# Patient Record
Sex: Male | Born: 1935 | Race: White | Hispanic: No | Marital: Married | Smoking: Former smoker
Health system: Southern US, Community
[De-identification: ages and names within clinical notes are randomized; demographics above are authoritative.]

## PROBLEM LIST (undated history)

## (undated) DIAGNOSIS — I2510 Atherosclerotic heart disease of native coronary artery without angina pectoris: Secondary | ICD-10-CM

## (undated) DIAGNOSIS — I4891 Unspecified atrial fibrillation: Secondary | ICD-10-CM

## (undated) DIAGNOSIS — R748 Abnormal levels of other serum enzymes: Secondary | ICD-10-CM

## (undated) DIAGNOSIS — I724 Aneurysm of artery of lower extremity: Secondary | ICD-10-CM

## (undated) DIAGNOSIS — Z951 Presence of aortocoronary bypass graft: Secondary | ICD-10-CM

## (undated) DIAGNOSIS — R9430 Abnormal result of cardiovascular function study, unspecified: Secondary | ICD-10-CM

## (undated) DIAGNOSIS — R609 Edema, unspecified: Secondary | ICD-10-CM

## (undated) DIAGNOSIS — I739 Peripheral vascular disease, unspecified: Secondary | ICD-10-CM

## (undated) DIAGNOSIS — I872 Venous insufficiency (chronic) (peripheral): Secondary | ICD-10-CM

## (undated) DIAGNOSIS — G459 Transient cerebral ischemic attack, unspecified: Secondary | ICD-10-CM

## (undated) DIAGNOSIS — R55 Syncope and collapse: Secondary | ICD-10-CM

## (undated) DIAGNOSIS — R001 Bradycardia, unspecified: Secondary | ICD-10-CM

## (undated) DIAGNOSIS — Y92009 Unspecified place in unspecified non-institutional (private) residence as the place of occurrence of the external cause: Secondary | ICD-10-CM

## (undated) DIAGNOSIS — E785 Hyperlipidemia, unspecified: Secondary | ICD-10-CM

## (undated) DIAGNOSIS — W19XXXA Unspecified fall, initial encounter: Secondary | ICD-10-CM

## (undated) DIAGNOSIS — I779 Disorder of arteries and arterioles, unspecified: Secondary | ICD-10-CM

## (undated) DIAGNOSIS — IMO0002 Reserved for concepts with insufficient information to code with codable children: Secondary | ICD-10-CM

## (undated) DIAGNOSIS — R479 Unspecified speech disturbances: Secondary | ICD-10-CM

## (undated) DIAGNOSIS — R0989 Other specified symptoms and signs involving the circulatory and respiratory systems: Secondary | ICD-10-CM

## (undated) DIAGNOSIS — I629 Nontraumatic intracranial hemorrhage, unspecified: Secondary | ICD-10-CM

## (undated) HISTORY — DX: Unspecified fall, initial encounter: W19.XXXA

## (undated) HISTORY — DX: Venous insufficiency (chronic) (peripheral): I87.2

## (undated) HISTORY — DX: Disorder of arteries and arterioles, unspecified: I77.9

## (undated) HISTORY — DX: Other specified symptoms and signs involving the circulatory and respiratory systems: R09.89

## (undated) HISTORY — DX: Abnormal result of cardiovascular function study, unspecified: R94.30

## (undated) HISTORY — DX: Presence of aortocoronary bypass graft: Z95.1

## (undated) HISTORY — DX: Edema, unspecified: R60.9

## (undated) HISTORY — PX: OTHER SURGICAL HISTORY: SHX169

## (undated) HISTORY — DX: Atherosclerotic heart disease of native coronary artery without angina pectoris: I25.10

## (undated) HISTORY — DX: Nontraumatic intracranial hemorrhage, unspecified: I62.9

## (undated) HISTORY — DX: Aneurysm of artery of lower extremity: I72.4

## (undated) HISTORY — DX: Abnormal levels of other serum enzymes: R74.8

## (undated) HISTORY — DX: Transient cerebral ischemic attack, unspecified: G45.9

## (undated) HISTORY — DX: Reserved for concepts with insufficient information to code with codable children: IMO0002

## (undated) HISTORY — DX: Hyperlipidemia, unspecified: E78.5

## (undated) HISTORY — DX: Bradycardia, unspecified: R00.1

## (undated) HISTORY — DX: Unspecified speech disturbances: R47.9

## (undated) HISTORY — DX: Unspecified place in unspecified non-institutional (private) residence as the place of occurrence of the external cause: Y92.009

## (undated) HISTORY — DX: Peripheral vascular disease, unspecified: I73.9

## (undated) HISTORY — DX: Syncope and collapse: R55

---

## 1996-03-04 HISTORY — PX: CORONARY ARTERY BYPASS GRAFT: SHX141

## 1999-02-19 ENCOUNTER — Inpatient Hospital Stay (HOSPITAL_COMMUNITY): Admission: EM | Admit: 1999-02-19 | Discharge: 1999-02-20 | Payer: Self-pay | Admitting: Emergency Medicine

## 1999-02-19 ENCOUNTER — Encounter: Payer: Self-pay | Admitting: Emergency Medicine

## 2004-02-01 ENCOUNTER — Ambulatory Visit: Payer: Self-pay | Admitting: Cardiology

## 2005-02-04 ENCOUNTER — Ambulatory Visit: Payer: Self-pay | Admitting: Cardiology

## 2005-02-20 ENCOUNTER — Encounter: Payer: Self-pay | Admitting: Cardiology

## 2005-02-20 ENCOUNTER — Ambulatory Visit: Payer: Self-pay

## 2006-02-04 ENCOUNTER — Ambulatory Visit: Payer: Self-pay | Admitting: Cardiology

## 2007-01-26 ENCOUNTER — Ambulatory Visit: Payer: Self-pay | Admitting: Cardiology

## 2007-10-21 ENCOUNTER — Encounter: Payer: Self-pay | Admitting: Cardiology

## 2008-01-04 ENCOUNTER — Ambulatory Visit: Payer: Self-pay | Admitting: Cardiology

## 2008-01-12 ENCOUNTER — Ambulatory Visit: Payer: Self-pay | Admitting: Cardiology

## 2008-01-20 ENCOUNTER — Ambulatory Visit: Payer: Self-pay

## 2008-09-22 ENCOUNTER — Ambulatory Visit: Payer: Self-pay | Admitting: Ophthalmology

## 2008-10-17 ENCOUNTER — Ambulatory Visit: Payer: Self-pay | Admitting: Ophthalmology

## 2008-11-23 ENCOUNTER — Encounter (INDEPENDENT_AMBULATORY_CARE_PROVIDER_SITE_OTHER): Payer: Self-pay | Admitting: Emergency Medicine

## 2008-11-23 ENCOUNTER — Ambulatory Visit: Payer: Self-pay | Admitting: Cardiology

## 2008-11-23 ENCOUNTER — Inpatient Hospital Stay (HOSPITAL_COMMUNITY): Admission: EM | Admit: 2008-11-23 | Discharge: 2008-11-25 | Payer: Self-pay | Admitting: Emergency Medicine

## 2008-11-24 ENCOUNTER — Ambulatory Visit: Payer: Self-pay | Admitting: Vascular Surgery

## 2008-12-06 ENCOUNTER — Inpatient Hospital Stay (HOSPITAL_COMMUNITY): Admission: RE | Admit: 2008-12-06 | Discharge: 2008-12-10 | Payer: Self-pay | Admitting: Vascular Surgery

## 2008-12-08 ENCOUNTER — Encounter: Payer: Self-pay | Admitting: Vascular Surgery

## 2008-12-09 ENCOUNTER — Ambulatory Visit: Payer: Self-pay | Admitting: Physical Medicine & Rehabilitation

## 2008-12-28 ENCOUNTER — Ambulatory Visit: Payer: Self-pay | Admitting: Vascular Surgery

## 2008-12-28 ENCOUNTER — Encounter: Payer: Self-pay | Admitting: Cardiology

## 2008-12-31 ENCOUNTER — Encounter: Payer: Self-pay | Admitting: Cardiology

## 2009-01-02 ENCOUNTER — Ambulatory Visit: Payer: Self-pay | Admitting: Cardiology

## 2009-03-07 ENCOUNTER — Encounter: Payer: Self-pay | Admitting: Cardiology

## 2009-03-30 ENCOUNTER — Ambulatory Visit: Payer: Self-pay | Admitting: Vascular Surgery

## 2009-04-10 ENCOUNTER — Encounter: Payer: Self-pay | Admitting: Cardiology

## 2009-04-12 ENCOUNTER — Ambulatory Visit: Payer: Self-pay | Admitting: Vascular Surgery

## 2009-04-19 ENCOUNTER — Ambulatory Visit: Payer: Self-pay | Admitting: Cardiology

## 2009-05-03 ENCOUNTER — Encounter: Admission: RE | Admit: 2009-05-03 | Discharge: 2009-05-03 | Payer: Self-pay | Admitting: Vascular Surgery

## 2009-05-03 ENCOUNTER — Ambulatory Visit: Payer: Self-pay | Admitting: Vascular Surgery

## 2009-05-04 ENCOUNTER — Ambulatory Visit: Payer: Self-pay

## 2009-05-04 ENCOUNTER — Encounter: Payer: Self-pay | Admitting: Cardiology

## 2009-06-07 ENCOUNTER — Ambulatory Visit: Payer: Self-pay | Admitting: Vascular Surgery

## 2009-10-31 ENCOUNTER — Encounter: Payer: Self-pay | Admitting: Cardiology

## 2009-11-01 ENCOUNTER — Ambulatory Visit: Payer: Self-pay | Admitting: Cardiology

## 2009-11-09 ENCOUNTER — Ambulatory Visit: Payer: Self-pay | Admitting: Vascular Surgery

## 2010-02-08 ENCOUNTER — Ambulatory Visit: Payer: Self-pay | Admitting: Vascular Surgery

## 2010-04-01 LAB — CONVERTED CEMR LAB
CO2: 34 meq/L — ABNORMAL HIGH (ref 19–32)
Chloride: 102 meq/L (ref 96–112)
Sodium: 141 meq/L (ref 135–145)
Total CK: 35 units/L (ref 7–232)

## 2010-04-03 NOTE — Assessment & Plan Note (Signed)
Summary: PER CHECK OUT/SF      Allergies Added:   Visit Type:  Follow-up Referring Provider:  Fabienne Bruns, MD / [place] Ropes Primary Provider:  Tama Gander, MD   History of Present Illness: The patient is seen for cardiology followup.  He has not had any syncope since I saw him last.  His leg is improving but he is being followed carefully by Dr.Fields to be sure there is graft is completely intact.  The patient did have an episode in January, 2011 that is described as him arriving at home and not knowing which driveway was his.  An MRI was done March 07, 2009 in [place].  There was no bleed or infarct.  A faxed copy available to me is not completely clear at the bottom of the page.  The patient has seen Dr.Reynolds of the neurology team.  I will send a copy of my information to him today.  There is question of planning for carotid Dopplers and a followup 2-D echo.  We are actively reviewing all of our information to document when the studies were done last.   From the cardiac viewpoint the patient feels well.  He is not having any palpitations or chest pain.  Current Medications (verified): 1)  Atenolol 25 Mg Tabs (Atenolol) .... Take One Tablet By Mouth Daily 2)  Simvastatin 10 Mg Tabs (Simvastatin) .... Take One Tablet By Mouth Daily At Bedtime 3)  Aspirin 81 Mg Tbec (Aspirin) .... Take One Tablet By Mouth Daily 4)  Multivitamins  Tabs (Multiple Vitamin) .... Take 1 By Mouth Once Daily 5)  Calcium 500 Mg Tabs (Calcium Carbonate) .... Take 2 By Mouth Once Daily  Allergies (verified): 1)  ! Penicillin 2)  ! Sulfa  Past History:  Past Medical History: coronary artery disease ...myoview...01/2008..no ischemia...EF  60% .Marland Kitchen..distal anterior scar  Ejection fraction.... normal by history.... no echo data available from the past as of April 19, 2009. CABG 1998 Hyperlipidemia Syncope..remote..no etiology  /  syncope 11/2008.. from acute leg pain Intermittent muscle aches and  pains of unknown etiology Good LV function Venous insufficiency with support hose worn Abnormal pulsation in the abdomen but normal abdominal aorta by ultrasound carotid artery disease..mild  .... Doppler.. January, 2008.... no significant carotid artery stenoses at that time.  Vertebral artery flow was antegrade bilaterally Discomfort and legs when walking CPK elevation 11/23/08.Marland KitchenMarland Kitchenprobably from debris from popliteal aneurysm Popliteal aneurysm .Marland Kitchen..repair..11/25/2008  Dr. Darrick Penna Neurologic event with brief infusion and speech difficulty.... January, 2011.... being assessed by neurology February, 2011  Review of Systems       Patient denies fever, chills, headache, sweats, rash, change in vision, change in hearing, chest pain, cough, shortness of breath, nausea vomiting, urinary symptoms.  All other systems are reviewed and are negative.  Vital Signs:  Patient profile:   77 year old male Height:      71 inches Weight:      216 pounds Pulse rate:   75 / minute BP sitting:   128 / 68  (left arm) Cuff size:   regular  Vitals Entered By: Hardin Negus, RMA (April 19, 2009 8:49 AM)  Physical Exam  General:  The patient is stable today. Head:  head is atraumatic. Eyes:  no xanthelasma. Neck:  no jugular venous distention. Chest Wall:  no chest wall tenderness. Lungs:  lungs are clear.  Respiratory effort is nonlabored. Heart:  cardiac exam reveals S1 and S2.  No clicks or significant murmurs. Abdomen:  abdomen is  soft. Msk:  no musculoskeletal deformities. Extremities:  no peripheral edema. Skin:  no skin rashes. Psych:  patient is oriented to person time and place.  Affect is normal.  A chair with his wife and his daughter today.   Impression & Recommendations:  Problem # 1:  * NEUROLOGIC EVENT JANUARY, 2011 BRIEF SPEECH DIFFICULTY capitis point the patient is neurologic event does not appear to be primary cardiac in origin.  I have reviewed to find data that we have from the  past.  We do not have an echocardiogram for several years at least.  The last carotid Doppler was done in 2008. He had no significant carotid disease at that time.  I will talk with the patient's daughter and finalize plans for him to have both an echo and carotid Dopplers done soon.  Problem # 2:  * POPLITEAL ANEURYSM The patient is post surgical repair of this aneurysm.  He follows with Dr. Darrick Penna  Problem # 3:  * CPK ELEVATION The patient has had CPK elevation around the time of his popliteal aneurysm.  We rechecked his CPK recently and it has normalized.  No further workup.  Problem # 4:  SYNCOPE (ICD-780.2) The patient has not had syncope since his last visit.  No further workup at this time.  Problem # 5:  CAD (ICD-414.00) Coronary disease is stable at this time.  I have reviewed outside labs the patient's primary physician.  His renal function is good and his TSH was normal.  These labs will be scanned into our computer.  I see the patient for cardiology follow up in 6 months.  Other Orders: Echocardiogram (Echo) Carotid Duplex (Carotid Duplex)  Patient Instructions: 1)  Your physician has requested that you have a carotid duplex. This test is an ultrasound of the carotid arteries in your neck. It looks at blood flow through these arteries that supply the brain with blood. Allow one hour for this exam. There are no restrictions or special instructions.  Can be done at [facility]. 2)  Your physician has requested that you have an echocardiogram.  Echocardiography is a painless test that uses sound waves to create images of your heart. It provides your doctor with information about the size and shape of your heart and how well your heart's chambers and valves are working.  This procedure takes approximately one hour. There are no restrictions for this procedure.  Can be done at [facility]. 3)  Follow up in 6 months

## 2010-04-03 NOTE — Assessment & Plan Note (Signed)
Summary: per check out/sf      Allergies Added:   Visit Type:  Follow-up Referring Provider:  Fabienne Bruns, MD / Thad Ranger, [place] Primary Provider:  Tama Gander, MD  CC:  CAD.  History of Present Illness: This delightful gentleman is seen for followup of coronary disease and syncope.  I have known him for many years.  Most recently he had a syncopal episode in 2010 associated with severe pain in his leg.  Ultimately all of these issues resolved.  He had an episode with decreased speech in January, 2011.  He is seeing neurology and no definite abnormalities have been found.  He does have some LV dysfunction by echo in March of 2011.  Is not having any chest pain he has no signs of heart failure.  Current Medications (verified): 1)  Atenolol 25 Mg Tabs (Atenolol) .... Take One Tablet By Mouth Daily 2)  Simvastatin 10 Mg Tabs (Simvastatin) .... Take One Tablet By Mouth Daily At Bedtime 3)  Aspirin 81 Mg Tbec (Aspirin) .... Take One Tablet By Mouth Daily 4)  Multivitamins  Tabs (Multiple Vitamin) .... Take 1 By Mouth Once Daily 5)  Calcium 500 Mg Tabs (Calcium Carbonate) .... Take 2 By Mouth Once Daily  Allergies (verified): 1)  ! Penicillin 2)  ! Sulfa  Past History:  Past Medical History: C AD    .Marland KitchenCeline Ahr...01/2008..no ischemia...EF  60% .Marland Kitchen..distal anterior scar  Ejection fraction.... normal by history...  /   echo..05/2009...45%...apical hypo.Marland KitchenMarland Kitchen??posterior and lateral hypo CABG 1998 Hyperlipidemia Syncope..remote..no etiology  /  syncope 11/2008.. from acute leg pain Intermittent muscle aches and pains of unknown etiology Good LV function Venous insufficiency with support hose worn Abnormal pulsation in the abdomen but normal abdominal aorta by ultrasound carotid artery disease..mild  .... Doppler.. January, 2008.... no significant carotid artery stenoses at that time.  Vertebral artery flow was antegrade bilaterally Discomfort and legs when walking CPK elevation  11/23/08.Marland KitchenMarland Kitchenprobably from debris from popliteal aneurysm Popliteal aneurysm .Marland Kitchen..repair..11/25/2008  Dr. Darrick Penna Neurologic event with brief infusion and speech difficulty.... January, 2011.... being assessed by neurology February, 2011 Carotid doppler.Marland KitchenMarland Kitchen3/2011....0-39%  bilateral  Review of Systems       Patient denies fever, chills, headache, sweats, rash, change in vision, change in hearing, chest pain, cough, nausea vomiting, urinary symptoms.  All other systems are reviewed and are negative.  Vital Signs:  Patient profile:   77 year old male Height:      71 inches Weight:      210 pounds BMI:     29.39 Pulse rate:   75 / minute BP sitting:   138 / 82  (left arm) Cuff size:   regular  Vitals Entered By: Hardin Negus, RMA (November 01, 2009 3:52 PM)  Physical Exam  General:  he looks great today. Eyes:  no xanthelasma. Neck:  no jugular venous distention. Lungs:  lungs are clear progress per Mitzie Na is not labored. Heart:  cardiac exam reveals S1 and S2.  No clicks or significant murmurs. Abdomen:  abdomen is soft. Extremities:  patient has chronic edema in his legs and is wearing his support. Psych:  patient is oriented to person time and place.  Affect is normal.   Impression & Recommendations:  Problem # 1:  * NEUROLOGIC EVENT JANUARY, 2011 BRIEF SPEECH DIFFICULTY This is resolved completely.  The patient is stable.  Problem # 2:  VENOUS INSUFFICIENCY (ICD-459.81) The patient wear support hose regularly.  Problem # 3:  SYNCOPE (ICD-780.2)  His updated medication list  for this problem includes:    Atenolol 25 Mg Tabs (Atenolol) .Marland Kitchen... Take one tablet by mouth daily    Aspirin 81 Mg Tbec (Aspirin) .Marland Kitchen... Take one tablet by mouth daily There is been no recurrent syncope.  He is stable.  Problem # 4:  CAD (ICD-414.00)  His updated medication list for this problem includes:    Atenolol 25 Mg Tabs (Atenolol) .Marland Kitchen... Take one tablet by mouth daily    Aspirin 81 Mg Tbec  (Aspirin) .Marland Kitchen... Take one tablet by mouth daily Coronary disease is stable.  No further workup.  Patient Instructions: 1)  Your physician recommends that you schedule a follow-up appointment in: 6 months. The office will mail you a reminder letter 2 months prior appointment date. Prescriptions: SIMVASTATIN 10 MG TABS (SIMVASTATIN) Take one tablet by mouth daily at bedtime  #90 x 3   Entered by:   Ollen Gross, RN, BSN   Authorized by:   Talitha Givens, MD, [facility]   Signed by:   Ollen Gross, RN, BSN on 11/01/2009   Method used:   Print then Give to Patient   RxID:   [id number] ATENOLOL 25 MG TABS (ATENOLOL) Take one tablet by mouth daily  #90 x 3   Entered by:   Ollen Gross, RN, BSN   Authorized by:   Talitha Givens, MD, [facility]   Signed by:   Ollen Gross, RN, BSN on 11/01/2009   Method used:   Print then Give to Patient   RxID:   [id number]

## 2010-04-03 NOTE — Miscellaneous (Signed)
  Clinical Lists Changes  Observations: Added new observation of PAST MED HX: C AD    .Marland KitchenCeline Ahr...01/2008..no ischemia...EF  60% .Marland Kitchen..distal anterior scar  Ejection fraction.... normal by history...  /   echo..05/2009...45%...apical hypo.Marland KitchenMarland Kitchen??posterior and lateral hypo CABG 1998 Hyperlipidemia Syncope..remote..no etiology  /  syncope 11/2008.. from acute leg pain Intermittent muscle aches and pains of unknown etiology Good LV function Venous insufficiency with support hose worn Abnormal pulsation in the abdomen but normal abdominal aorta by ultrasound carotid artery disease..mild  .... Doppler.. January, 2008.... no significant carotid artery stenoses at that time.  Vertebral artery flow was antegrade bilaterally Discomfort and legs when walking CPK elevation 11/23/08.Marland KitchenMarland Kitchenprobably from debris from popliteal aneurysm Popliteal aneurysm .Marland Kitchen..repair..11/25/2008  Dr. Darrick Penna Neurologic event with brief infusion and speech difficulty.... January, 2011.... being assessed by neurology February, 2011 Carotid doppler.Marland KitchenMarland Kitchen3/2011....0-39%  bilateral (10/31/2009 20:22) Added new observation of REFERRING MD: Fabienne Bruns, MD / Thad Ranger, M (10/31/2009 20:22) Added new observation of PRIMARY MD: Tama Gander, MD (10/31/2009 20:22)       Past History:  Past Medical History: C AD    .Marland KitchenCeline Ahr...01/2008..no ischemia...EF  60% .Marland Kitchen..distal anterior scar  Ejection fraction.... normal by history...  /   echo..05/2009...45%...apical hypo.Marland KitchenMarland Kitchen??posterior and lateral hypo CABG 1998 Hyperlipidemia Syncope..remote..no etiology  /  syncope 11/2008.. from acute leg pain Intermittent muscle aches and pains of unknown etiology Good LV function Venous insufficiency with support hose worn Abnormal pulsation in the abdomen but normal abdominal aorta by ultrasound carotid artery disease..mild  .... Doppler.. January, 2008.... no significant carotid artery stenoses at that time.  Vertebral artery flow was antegrade  bilaterally Discomfort and legs when walking CPK elevation 11/23/08.Marland KitchenMarland Kitchenprobably from debris from popliteal aneurysm Popliteal aneurysm .Marland Kitchen..repair..11/25/2008  Dr. Darrick Penna Neurologic event with brief infusion and speech difficulty.... January, 2011.... being assessed by neurology February, 2011 Carotid doppler.Marland KitchenMarland Kitchen3/2011....0-39%  bilateral

## 2010-04-03 NOTE — Progress Notes (Signed)
Summary: [facility]  [facility]   Imported By: Harlon Flor 04/20/2009 12:10:59  _____________________________________________________________________  External Attachment:    Type:   Image     Comment:   External Document

## 2010-05-21 ENCOUNTER — Encounter: Payer: Self-pay | Admitting: Cardiology

## 2010-05-21 ENCOUNTER — Ambulatory Visit (INDEPENDENT_AMBULATORY_CARE_PROVIDER_SITE_OTHER): Payer: Medicare Other | Admitting: Cardiology

## 2010-05-21 DIAGNOSIS — I2510 Atherosclerotic heart disease of native coronary artery without angina pectoris: Secondary | ICD-10-CM

## 2010-05-31 NOTE — Miscellaneous (Signed)
  Clinical Lists Changes  Observations: Added new observation of REFERRING MD: Fabienne Bruns, MD / Thad Ranger, M (05/21/2010 17:30) Added new observation of PRIMARY MD: [name], MD (05/21/2010 17:30)

## 2010-05-31 NOTE — Assessment & Plan Note (Signed)
Summary: rov/sl/per pt call/mj/kl    Visit Type:  Follow-up Referring Provider:  Fabienne Bruns, MD / Thad Ranger, [place] Primary Provider:  Tama Gander, MD  CC:  CAD.  History of Present Illness: The patient is seen for cardiology followup.  He is doing extremely well.  He had an echo in March, 2011.  Ejection fraction was 45%.  He had a TIA in January, 2011.  He's had no recurrence.  Carotid Dopplers in March, 2011 showed only minor disease.  Preventive Screening-Counseling & Management  Alcohol-Tobacco     Smoking Status: quit  Caffeine-Diet-Exercise     Does Patient Exercise: yes      Drug Use:  no.    Allergies: 1)  ! Penicillin 2)  ! Sulfa  Past History:  Past Medical History: C AD    .Marland KitchenCeline Ahr...01/2008..no ischemia...EF  60% .Marland Kitchen..distal anterior scar  Ejection fraction.... normal by history...  /   echo..05/2009...45%...apical hypo.Marland KitchenMarland Kitchen??posterior and lateral hypo CABG 1998 Hyperlipidemia Syncope..remote..no etiology  /  syncope 11/2008.. from acute leg pain Intermittent muscle aches and pains of unknown etiology Venous insufficiency with support hose worn Abnormal pulsation in the abdomen but normal abdominal aorta by ultrasound carotid artery disease..mild  .... Doppler.. January, 2008.... no significant carotid artery stenoses at that time.  Vertebral artery flow was antegrade bilaterally Discomfort and legs when walking CPK elevation 11/23/08.Marland KitchenMarland Kitchenprobably from debris from popliteal aneurysm Popliteal aneurysm .Marland Kitchen..repair..11/25/2008  Dr. Darrick Penna Neurologic event with brief infusion and speech difficulty.... January, 2011.... being assessed by neurology February, 2011 Carotid doppler.Marland KitchenMarland Kitchen3/2011....0-39%  bilateral  Past Surgical History: Aneurysm CABG  Family History: Family History of Diabetes: 2 sisters Family History of CVA or Stroke: mother Family History of Diabetes: mother  Social History: Retired  Married  Tobacco Use - Former. 1946 Alcohol Use -  no Regular Exercise - yes Drug Use - no Smoking Status:  quit Does Patient Exercise:  yes Drug Use:  no  Review of Systems       Patient denies fever, chills, headache, sweats, rest, change in vision, change in hearing, chest pain, cough, nausea vomiting, urinary symptoms.  All other systems are reviewed and are negative.  Vital Signs:  Patient profile:   77 year old male Height:      71 inches Weight:      214 pounds BMI:     29.95 Pulse rate:   64 / minute BP sitting:   168 / 89  (left arm) Cuff size:   regular  Vitals Entered By: Hardin Negus, RMA (May 21, 2010 4:41 PM)  Physical Exam  General:  patient is stable. Eyes:  no xanthelasma. Neck:  Neck supple, no JVD. No masses, thyromegaly or abnormal cervical nodes. Lungs:  lungs are clear.  Respiratory effort is nonlabored. Heart:  cardiac exam reveals an S1-S2.  No clicks or significant murmurs. Abdomen:  abdomen soft. Extremities:  no peripheral edema. Psych:  patient is oriented to person time and place.  Affect is normal.   Impression & Recommendations:  Problem # 1:  * NEUROLOGIC EVENT JANUARY, 2011 BRIEF SPEECH DIFFICULTY This has resolved completely.  He is stable.  Problem # 2:  SYNCOPE (ICD-780.2) There's been no recurrent syncope.  Problem # 3:  CAD (ICD-414.00)  His updated medication list for this problem includes:    Atenolol 25 Mg Tabs (Atenolol) .Marland Kitchen... Take one tablet by mouth daily    Aspirin 81 Mg Tbec (Aspirin) .Marland Kitchen... Take one tablet by mouth daily Coronary disease is stable.  No change  in therapy.  I see him back in 6 months.  Patient Instructions: 1)  Your physician wants you to follow-up in:  6 months.  You will receive a reminder letter in the mail two months in advance. If you don't receive a letter, please call our office to schedule the follow-up appointment.

## 2010-06-01 ENCOUNTER — Encounter: Payer: Self-pay | Admitting: Cardiology

## 2010-06-07 LAB — URINALYSIS, ROUTINE W REFLEX MICROSCOPIC
Bilirubin Urine: NEGATIVE
Glucose, UA: NEGATIVE mg/dL
Glucose, UA: NEGATIVE mg/dL
Hgb urine dipstick: NEGATIVE
Ketones, ur: NEGATIVE mg/dL
Ketones, ur: NEGATIVE mg/dL
Protein, ur: NEGATIVE mg/dL
pH: 7.5 (ref 5.0–8.0)

## 2010-06-07 LAB — COMPREHENSIVE METABOLIC PANEL
ALT: 16 U/L (ref 0–53)
CO2: 29 mEq/L (ref 19–32)
Calcium: 8.8 mg/dL (ref 8.4–10.5)
Creatinine, Ser: 0.76 mg/dL (ref 0.4–1.5)
GFR calc non Af Amer: >60 mL/min (ref >60)
Glucose, Bld: 119 mg/dL — ABNORMAL HIGH (ref 70–99)

## 2010-06-07 LAB — CBC
HCT: 32.5 % — ABNORMAL LOW (ref 39.0–52.0)
HCT: 35 % — ABNORMAL LOW (ref 39.0–52.0)
HCT: 42.7 % (ref 39.0–52.0)
Hemoglobin: 11.4 g/dL — ABNORMAL LOW (ref 13.0–17.0)
Hemoglobin: 12 g/dL — ABNORMAL LOW (ref 13.0–17.0)
Hemoglobin: 14.4 g/dL (ref 13.0–17.0)
MCHC: 33.8 g/dL (ref 30.0–36.0)
MCV: 94.4 fL (ref 78.0–100.0)
MCV: 94.5 fL (ref 78.0–100.0)
MCV: 95.7 fL (ref 78.0–100.0)
Platelets: 110 10*3/uL — ABNORMAL LOW (ref 150–400)
RBC: 3.66 MIL/uL — ABNORMAL LOW (ref 4.22–5.81)
RBC: 4.52 MIL/uL (ref 4.22–5.81)
RDW: 13.5 % (ref 11.5–15.5)
WBC: 6.8 10*3/uL (ref 4.0–10.5)

## 2010-06-07 LAB — BASIC METABOLIC PANEL
BUN: 6 mg/dL (ref 6–23)
BUN: 8 mg/dL (ref 6–23)
Chloride: 101 mEq/L (ref 96–112)
Chloride: 104 mEq/L (ref 96–112)
GFR calc Af Amer: >60 mL/min (ref >60)
Glucose, Bld: 130 mg/dL — ABNORMAL HIGH (ref 70–99)
Potassium: 3.9 mEq/L (ref 3.5–5.1)
Potassium: 4.7 mEq/L (ref 3.5–5.1)
Sodium: 140 mEq/L (ref 135–145)

## 2010-06-07 LAB — PROTIME-INR: Prothrombin Time: 13.7 seconds (ref 11.6–15.2)

## 2010-06-07 LAB — ABO/RH: ABO/RH(D): B POS

## 2010-06-08 LAB — POCT I-STAT, CHEM 8
BUN: 14 mg/dL (ref 6–23)
Chloride: 105 mEq/L (ref 96–112)
Creatinine, Ser: 0.8 mg/dL (ref 0.4–1.5)
Potassium: 4.2 mEq/L (ref 3.5–5.1)
Sodium: 138 mEq/L (ref 135–145)

## 2010-06-08 LAB — CBC
HCT: 38 % — ABNORMAL LOW (ref 39.0–52.0)
Hemoglobin: 12.9 g/dL — ABNORMAL LOW (ref 13.0–17.0)
MCV: 95 fL (ref 78.0–100.0)
MCV: 95.1 fL (ref 78.0–100.0)
Platelets: 125 10*3/uL — ABNORMAL LOW (ref 150–400)
Platelets: 131 10*3/uL — ABNORMAL LOW (ref 150–400)
RBC: 4.33 MIL/uL (ref 4.22–5.81)
RDW: 13.1 % (ref 11.5–15.5)
WBC: 6.4 10*3/uL (ref 4.0–10.5)

## 2010-06-08 LAB — DIFFERENTIAL
Eosinophils Relative: 1 % (ref 0–5)
Lymphocytes Relative: 9 % — ABNORMAL LOW (ref 12–46)
Lymphs Abs: 0.6 10*3/uL — ABNORMAL LOW (ref 0.7–4.0)
Monocytes Absolute: 0.1 10*3/uL (ref 0.1–1.0)

## 2010-06-08 LAB — BASIC METABOLIC PANEL
BUN: 10 mg/dL (ref 6–23)
BUN: 12 mg/dL (ref 6–23)
Calcium: 8.6 mg/dL (ref 8.4–10.5)
Chloride: 104 mEq/L (ref 96–112)
Chloride: 105 mEq/L (ref 96–112)
Creatinine, Ser: 0.74 mg/dL (ref 0.4–1.5)
GFR calc Af Amer: >60 mL/min (ref >60)
GFR calc non Af Amer: >60 mL/min (ref >60)
Glucose, Bld: 109 mg/dL — ABNORMAL HIGH (ref 70–99)
Potassium: 4.3 mEq/L (ref 3.5–5.1)
Sodium: 138 mEq/L (ref 135–145)

## 2010-06-08 LAB — GLUCOSE, CAPILLARY
Glucose-Capillary: 112 mg/dL — ABNORMAL HIGH (ref 70–99)
Glucose-Capillary: 96 mg/dL (ref 70–99)

## 2010-06-08 LAB — TROPONIN I
Troponin I: 0.03 ng/mL (ref 0.00–0.06)
Troponin I: 0.03 ng/mL (ref 0.00–0.06)

## 2010-06-08 LAB — LIPID PANEL
Cholesterol: 101 mg/dL (ref 0–200)
HDL: 29 mg/dL — ABNORMAL LOW (ref >39)
Total CHOL/HDL Ratio: 3.5 RATIO
Triglycerides: 68 mg/dL (ref <150)

## 2010-06-08 LAB — PROTIME-INR
INR: 1.3 (ref 0.00–1.49)
Prothrombin Time: 15.7 seconds — ABNORMAL HIGH (ref 11.6–15.2)

## 2010-06-08 LAB — CARDIAC PANEL(CRET KIN+CKTOT+MB+TROPI)
CK, MB: 17 ng/mL — ABNORMAL HIGH (ref 0.3–4.0)
CK, MB: 18.8 ng/mL — ABNORMAL HIGH (ref 0.3–4.0)
Relative Index: 0.5 (ref 0.0–2.5)
Relative Index: 0.5 (ref 0.0–2.5)
Relative Index: 0.5 (ref 0.0–2.5)
Troponin I: 0.02 ng/mL (ref 0.00–0.06)
Troponin I: 0.02 ng/mL (ref 0.00–0.06)
Troponin I: 0.03 ng/mL (ref 0.00–0.06)

## 2010-06-08 LAB — URINALYSIS, ROUTINE W REFLEX MICROSCOPIC
Bilirubin Urine: NEGATIVE
Hgb urine dipstick: NEGATIVE
Ketones, ur: NEGATIVE mg/dL
Nitrite: NEGATIVE
Urobilinogen, UA: 1 mg/dL (ref 0.0–1.0)
pH: 6 (ref 5.0–8.0)

## 2010-06-08 LAB — URINE MICROSCOPIC-ADD ON

## 2010-06-08 LAB — CK TOTAL AND CKMB (NOT AT ARMC)
CK, MB: 6.6 ng/mL — ABNORMAL HIGH (ref 0.3–4.0)
Relative Index: 1.3 (ref 0.0–2.5)

## 2010-06-26 ENCOUNTER — Inpatient Hospital Stay (HOSPITAL_COMMUNITY)
Admission: EM | Admit: 2010-06-26 | Discharge: 2010-06-30 | DRG: 085 | Disposition: A | Discharge status: Home Health | Payer: Medicare Other | Attending: Internal Medicine | Admitting: Internal Medicine

## 2010-06-26 ENCOUNTER — Emergency Department (HOSPITAL_COMMUNITY): Payer: Medicare Other

## 2010-06-26 DIAGNOSIS — I872 Venous insufficiency (chronic) (peripheral): Secondary | ICD-10-CM | POA: Diagnosis present

## 2010-06-26 DIAGNOSIS — J189 Pneumonia, unspecified organism: Secondary | ICD-10-CM | POA: Diagnosis present

## 2010-06-26 DIAGNOSIS — I2589 Other forms of chronic ischemic heart disease: Secondary | ICD-10-CM | POA: Diagnosis present

## 2010-06-26 DIAGNOSIS — R5381 Other malaise: Secondary | ICD-10-CM | POA: Diagnosis present

## 2010-06-26 DIAGNOSIS — Z87891 Personal history of nicotine dependence: Secondary | ICD-10-CM

## 2010-06-26 DIAGNOSIS — Z7982 Long term (current) use of aspirin: Secondary | ICD-10-CM

## 2010-06-26 DIAGNOSIS — R55 Syncope and collapse: Secondary | ICD-10-CM | POA: Diagnosis present

## 2010-06-26 DIAGNOSIS — Y92009 Unspecified place in unspecified non-institutional (private) residence as the place of occurrence of the external cause: Secondary | ICD-10-CM

## 2010-06-26 DIAGNOSIS — I2510 Atherosclerotic heart disease of native coronary artery without angina pectoris: Secondary | ICD-10-CM | POA: Diagnosis present

## 2010-06-26 DIAGNOSIS — S06300A Unspecified focal traumatic brain injury without loss of consciousness, initial encounter: Principal | ICD-10-CM | POA: Diagnosis present

## 2010-06-26 DIAGNOSIS — I10 Essential (primary) hypertension: Secondary | ICD-10-CM | POA: Diagnosis present

## 2010-06-26 DIAGNOSIS — W19XXXA Unspecified fall, initial encounter: Secondary | ICD-10-CM | POA: Diagnosis present

## 2010-06-26 DIAGNOSIS — E785 Hyperlipidemia, unspecified: Secondary | ICD-10-CM | POA: Diagnosis present

## 2010-06-26 DIAGNOSIS — Z951 Presence of aortocoronary bypass graft: Secondary | ICD-10-CM

## 2010-06-26 LAB — CBC
Hemoglobin: 14.5 g/dL (ref 13.0–17.0)
MCH: 32.1 pg (ref 26.0–34.0)
Platelets: ADEQUATE 10*3/uL (ref 150–400)
RBC: 4.52 MIL/uL (ref 4.22–5.81)

## 2010-06-26 LAB — DIFFERENTIAL
Basophils Absolute: 0 10*3/uL (ref 0.0–0.1)
Eosinophils Absolute: 0.1 10*3/uL (ref 0.0–0.7)
Lymphocytes Relative: 12 % (ref 12–46)
Monocytes Relative: 8 % (ref 3–12)
Neutro Abs: 5.4 10*3/uL (ref 1.7–7.7)
Neutrophils Relative %: 79 % — ABNORMAL HIGH (ref 43–77)

## 2010-06-26 LAB — COMPREHENSIVE METABOLIC PANEL
ALT: 13 U/L (ref 0–53)
AST: 37 U/L (ref 0–37)
Albumin: 3.6 g/dL (ref 3.5–5.2)
CO2: 26 mEq/L (ref 19–32)
Calcium: 8.9 mg/dL (ref 8.4–10.5)
Creatinine, Ser: 0.78 mg/dL (ref 0.4–1.5)
GFR calc Af Amer: >60 mL/min (ref >60)
GFR calc non Af Amer: >60 mL/min (ref >60)
Sodium: 134 mEq/L — ABNORMAL LOW (ref 135–145)
Total Protein: 6.4 g/dL (ref 6.0–8.3)

## 2010-06-26 LAB — URINALYSIS, ROUTINE W REFLEX MICROSCOPIC
Bilirubin Urine: NEGATIVE
Ketones, ur: 15 mg/dL — AB
Nitrite: NEGATIVE
Protein, ur: NEGATIVE mg/dL
pH: 8 (ref 5.0–8.0)

## 2010-06-26 LAB — CK TOTAL AND CKMB (NOT AT ARMC)
CK, MB: 1.6 ng/mL (ref 0.3–4.0)
CK, MB: 1.3 ng/mL (ref 0.3–4.0)
Relative Index: INVALID (ref 0.0–2.5)
Relative Index: INVALID (ref 0.0–2.5)
Total CK: 46 U/L (ref 7–232)

## 2010-06-26 LAB — PROCALCITONIN: Procalcitonin: <0.1 ng/mL

## 2010-06-26 LAB — PROTIME-INR
INR: 1.01 (ref 0.00–1.49)
Prothrombin Time: 13.5 seconds (ref 11.6–15.2)

## 2010-06-26 LAB — POCT I-STAT, CHEM 8
HCT: 42 % (ref 39.0–52.0)
Hemoglobin: 14.3 g/dL (ref 13.0–17.0)
Potassium: 5.3 mEq/L — ABNORMAL HIGH (ref 3.5–5.1)
Sodium: 134 mEq/L — ABNORMAL LOW (ref 135–145)
TCO2: 24 mmol/L (ref 0–100)

## 2010-06-26 LAB — TROPONIN I: Troponin I: 0.02 ng/mL (ref 0.00–0.06)

## 2010-06-27 ENCOUNTER — Other Ambulatory Visit (HOSPITAL_COMMUNITY): Payer: Medicare Other

## 2010-06-27 ENCOUNTER — Inpatient Hospital Stay (HOSPITAL_COMMUNITY): Payer: Medicare Other

## 2010-06-27 LAB — LIPID PANEL
HDL: 36 mg/dL — ABNORMAL LOW (ref >39)
LDL Cholesterol: 60 mg/dL (ref 0–99)
Triglycerides: 61 mg/dL (ref <150)
VLDL: 12 mg/dL (ref 0–40)

## 2010-06-27 LAB — URINE CULTURE
Colony Count: NO GROWTH
Culture: NO GROWTH

## 2010-06-27 LAB — LEGIONELLA ANTIGEN, URINE: Legionella Antigen, Urine: NEGATIVE

## 2010-06-27 LAB — CARDIAC PANEL(CRET KIN+CKTOT+MB+TROPI)
CK, MB: 2.1 ng/mL (ref 0.3–4.0)
CK, MB: 2.6 ng/mL (ref 0.3–4.0)
Troponin I: 0.02 ng/mL (ref 0.00–0.06)
Troponin I: 0.02 ng/mL (ref 0.00–0.06)

## 2010-06-27 LAB — HEMOGLOBIN A1C: Hgb A1c MFr Bld: 6 % — ABNORMAL HIGH (ref <5.7)

## 2010-06-27 NOTE — Consult Note (Signed)
2NAMESHAKIM, FAITH                 ACCOUNT NO.:  [id number]  MEDICAL RECORD NO.:  [id number]           PATIENT TYPE:  I  LOCATION:  4715                         FACILITY:  [facility]  PHYSICIAN:  Levie Heritage, MD       DATE OF BIRTH:  [birth year]  DATE OF CONSULTATION: DATE OF DISCHARGE:                                CONSULTATION   REFERRING PHYSICIAN:  ER Team, the Code Stroke.  CHIEF COMPLAINT:  Code stroke after a fall.  HISTORY OF PRESENT ILLNESS:  This patient is an 77 year old man who was in his usual state of health until 5 p.m. today, his wife heard him falling, when she went to check him in the room, he was on the ground. She called the EMS right away who found him confused, but moving all his four extremities and mumbling.  The patient was brought to the hospital immediately and NHIS stroke scale at 6:30 p.m. was performed to show only of one score for the confusion part.  The CT scan of the head was performed that is showing 2 small areas of subependymal hemorrhages along the right lateral ventricle.  TPA was not offered because of intracranial bleed.  As per the EMS record, his blood pressure at the scene first time was found to be in 80 mmHg systolic.  There have been reports by the EMS of having his blood pressure fluctuating from 140-160 as well on the way to the hospital.  PAST MEDICAL HISTORY:  The patient is status post cardiac bypass surgery years ago.  He has hyperlipidemia and he had right lower extremity bypass graft above-knee to below-knee level.  SOCIAL HISTORY:  He is retired, lives with his wife, quit smoking 50 years ago.  No alcohol or drug abuse.  FAMILY HISTORY:  Mother had diabetes and stroke.  Father died of heart attack.  ALLERGIES:  SULFA and PENICILLIN.  His current list of medication: 1. He takes a baby aspirin 81 mg daily. 2. Zocor 10 mg at bedtime. 3. Atenolol 25 mg daily basis.  REVIEW OF CLINICAL DATA:  I have reviewed patient's  CT scan of the head performed today and have noted two small subependymal hemorrhages.  His i-STAT, Chem-8 revealed mildly increased glucose and mildly increased potassium.  PHYSICAL EXAMINATION:  GENERAL/VITAL SIGNS:  Currently comfortably lying down with vitals 138/94 mmHg and a pulse of 77 per minute. He is awake, oriented x2.  He is confused to the location, but he knows his date of birth, he knows how old is he. He could recognize his daughter clearly. NIH stroke scale at the most could be between 1-2 at 6:30 p.m. because of the confusion. Other than that he has reactive pupils, doubt there is any field cut. Moves eyes to all directions.  Symmetrical face.  Midline tongue. Intact sensation at the face. Moves all four extremities with full strength. Admits to feel light touch sensation all over. Gait was deferred.  IMPRESSION:  This patient is an 77 year old man with sudden fall and currently is confused suggesting delirium because of two small subependymal tiny hemorrhages focii.  The  location is very atypical for any specific known etiology, however, his fall could be the contributing factor there.  PLAN: 1. Please stop the patient's aspirin as he was taking outpatient. 2. Avoid all the aspirin antiplatelets or anticoagulant or heparin     products. 3. Please keep the blood pressure between 140-160 mmHg systolic     ranges.  If he fluctuates too high beyond that range, nicardipine     will be the drug of choice. 4. Please get the MRI and MRA of the brain for evaluation. 5. I have discussed with the patient's daughter in detail at the     bedside about my impression as well as discuss with the emergency     physician for the further workup.  Neurology will follow up with     the patient as the case progresses.          ______________________________ Levie Heritage, MD     WS/MEDQ  D:  06/26/2010  T:  06/27/2010  Job:  [id number]  Electronically Signed by Levie Heritage  MD on 06/27/2010 12:54:15 PM

## 2010-06-27 NOTE — H&P (Signed)
[name] [name], MAGNUSSEN NO.:  [id number]  MEDICAL RECORD NO.:  [id number]           PATIENT TYPE:  E  LOCATION:  [facility]                         FACILITY:  [facility]  PHYSICIAN:  Mariea Stable, MD   DATE OF BIRTH:  09-28-1921  DATE OF ADMISSION:  06/26/2010 DATE OF DISCHARGE:                             HISTORY & PHYSICAL   PRIMARY CARE PHYSICIAN:  Steele Sizer, MD in [place], [place].  CARDIOLOGIST:  Luis Abed, MD, [facility]  VASCULAR SURGEON:  Janetta Hora. Fields, MD  CHIEF COMPLAINT:  Syncope.  HISTORY OF PRESENT ILLNESS:  History obtained from the patient's wife and daughter as well as ED physician.  The patient apparently was in the living room after dinner and told his wife he was feeling cold and got up to take of his sweater and at that time she heard a noise.  When she walked in into the living room and found lying on his right side and states that he was "out" for a minute.  She called EMS at that time that brought outpatient to the emergency department for further evaluation. Per reports, the patient was a bit confused after the episode.  Upon arrival to the emergency department, he was noted to be a bit confused. Code Stroke was activated.  Apparently, the patient did not have any focal deficits.  CT scan of the head was obtained, which demonstrated two subependymal hemorrhages along the right lateral ventricle and this was discussed with Dr. Hoy Morn from Neurology.  Upon further review, the patient apparently has had episodes of syncope in the past of unclear etiology to me.  Upon further review of systems, the patient has had a mild cough along with chills reported.  He denies any other complaints aside from those mentioned before.  PAST MEDICAL HISTORY: 1. Coronary artery disease, mild with Dopplers showing 0-39%     bilaterally as of March 2012, status post CABG in 1998, Myoview in     November 2009 without ischemia, an EF of 60% with  distal anterior     scar, echo in March 2011, EF of 45% with apical hypokinesis. 2. Hyperlipidemia. 3. Syncope, recurrent of unclear etiology.  The patient did have a     syncopal episode in September 2010 secondary to acute leg pain and     found to have an arterial aneurysm at that time. 4. Intermittent muscle aches and pains. 5. Venous insufficiency. 6. Mild carotid artery disease per Dopplers at least January 2008, not     more recent. 7. Popliteal aneurysm repaired by Dr. Darrick Penna in September 2010. 8. Questionable TIA in January 2011.  ALLERGIES:  PENICILLIN and SULFA.  MEDICATIONS: 1. Multivitamin one tablet p.o. daily. 2. Calcium carbonate 500 mg two tablets p.o. daily. 3. Aspirin 81 mg p.o. daily. 4. Zocor 10 mg p.o. daily. 5. Atenolol 25 mg p.o. daily.  SOCIAL HISTORY:  The patient lives with his wife [name].  In case of need for medical decision making, discussed with the patient and family members present.  His daughter [name] should be contacted, her home phone  number is [phone] and her cellphone is [phone]. His other daughter [name] is also at bedside, her cellphone number is [phone].  The patient has a remote history of smoking and quit approximately 50 years ago.  He denies any alcohol or drug use.  FAMILY HISTORY:  Noncontributory.  REVIEW OF SYSTEMS:  As per HPI.  All others were reviewed and negative.  PHYSICAL EXAMINATION:  VITAL SIGNS:  Temperature 101.9, blood pressure 138/60, heart rate 88, respirations 18, oxygen saturation 95% on room air. GENERAL:  This is an elderly man lying in bed in no acute distress. HEENT:  Head is normocephalic.  There is a mild abrasion above the right eyebrow.  C-collar is in place.  Pupils are equally round and reactive to light and accommodation.  Extraocular movements are intact.  Sclerae anicteric.  Mucous membranes are slightly dry.  There is no oropharyngeal lesions. NECK:  Again  has C-collar in place.  There are no carotid bruits. HEART:  There is a normal S1-S2 with a regular rate and rhythm.  No obvious murmurs, gallops, or rubs. LUNGS:  Clear to auscultation anterolaterally with mild bibasilar crackles posterolaterally. ABDOMEN:  Positive bowel sounds, soft, nontender, and nondistended.  No guarding. EXTREMITIES:  There is +1 bilateral pitting edema with changes of chronic venous insufficiency. NEUROLOGIC:  The patient is awake, alert and oriented x3.  Cranial nerves are intact.  Motor is 5/5 x4 extremities.  Sensation is grossly intact.  LABORATORY DATA:  WBC 6.8, hemoglobin 14.5, platelets are clumped.  PT 13.5, INR 1.01, PTT 21.  Sodium 134, potassium 5.2, chloride 100, bicarb 26, glucose 119, BUN 16, creatinine 0.78.  LFTs are within normal limits.  CK of 94, CK-MB 1.6, troponin 0.01.  Lactic acid 2.0. Procalcitonin less than 0.1.  IMAGING: 1. Chest x-ray; cardiomegaly with low lung volumes and moderate left     hemidiaphragm elevation. 2. Probable atelectasis over the left elevated hemidiaphragm. 3. CT of head without contrast.  Impression:  Two subependymal     hemorrhages along the right lateral ventricle.  Findings were     discussed with Dr. Hoy Morn at the time of interpretation. 4. Atrophy and chronic microvascular ischemic changes. 5. CT of the cervical spine without contrast; impression, no acute     findings. 6. Electrocardiogram shows normal sinus rhythm without any acute     ischemic changes.  ASSESSMENT AND PLAN: 1. Syncope.  The patient has had prior syncopal event of unclear     etiology with a thorough workup.  The patient denies any preceding     symptoms to the syncope and does have known coronary artery     disease, status post coronary artery bypass graft, which raises the     concern for possible arrhythmia.  We will go ahead and admit to     telemetry monitor, heart rhythm for any malignant arrhythmias.  We     will also  cycle cardiac enzymes to rule out any ischemia.  The     patient currently is nonfocal and back to baseline.  CT scan of the     head does not show any acute infarct, but does show the two     subependymal hemorrhages along the right lateral ventricle.     Neurology has seen the patient as Code Stroke was activated upon     his arrival.  They have recommended an MRI brain and MRA head which     have been ordered.  The patient has  recent carotid Dopplers     mentioned in the past medical history and therefore, we will not     pursue further studies at this time.  Furthermore, he follows with     Dr. Myrtis Ser and has had a recent 2-D echocardiogram and therefore, we     will not reorder. 2. Possible pneumonia.  The patient does have subjective symptoms     consistent with fevers and a documented fever of 101.9 here.  He     apparently has had a mild cough over the last few days per the     patient and his wife.  We will go ahead and treat with Avelox 400     mg IV daily in transition to p.o.  We will also check a urine strep     antigen and a urine Legionella antigen. 3. Coronary artery disease.  We will continue the patient's aspirin,     statin, beta-blocker. 4. Ischemic cardiomyopathy.  The patient's last ejection fraction was     45%, therefore there was no need for an ACE inhibitor.     Furthermore, the patient's potassium is currently minimally     elevated if not upper normal at 5.2; however, ACE inhibitor is not     needed as mentioned. 5. Code status.  The patient and daughter currently at bedside report     that this discussion has not taken place in the past.  At this     time, the patient will be a full code and in case of emergency, the     contacts are listed above in the social history.     Mariea Stable, MD     MA/MEDQ  D:  06/26/2010  T:  06/26/2010  Job:  [id number]  cc:   Steele Sizer, MD Luis Abed, MD, [facility] Janetta Hora. Darrick Penna, MD  Electronically Signed  by Mariea Stable MD on 06/27/2010 02:46:55 PM

## 2010-06-28 ENCOUNTER — Inpatient Hospital Stay (HOSPITAL_COMMUNITY): Payer: Medicare Other

## 2010-06-28 DIAGNOSIS — I6350 Cerebral infarction due to unspecified occlusion or stenosis of unspecified cerebral artery: Secondary | ICD-10-CM

## 2010-06-28 DIAGNOSIS — R55 Syncope and collapse: Secondary | ICD-10-CM

## 2010-06-28 LAB — BASIC METABOLIC PANEL
BUN: 9 mg/dL (ref 6–23)
CO2: 28 mEq/L (ref 19–32)
Calcium: 9.2 mg/dL (ref 8.4–10.5)
Chloride: 102 mEq/L (ref 96–112)
Creatinine, Ser: 0.78 mg/dL (ref 0.4–1.5)
GFR calc Af Amer: >60 mL/min (ref >60)
Glucose, Bld: 109 mg/dL — ABNORMAL HIGH (ref 70–99)

## 2010-06-28 LAB — CBC
MCH: 31 pg (ref 26.0–34.0)
MCV: 91.9 fL (ref 78.0–100.0)
Platelets: 113 10*3/uL — ABNORMAL LOW (ref 150–400)
RDW: 12.9 % (ref 11.5–15.5)

## 2010-06-29 DIAGNOSIS — I517 Cardiomegaly: Secondary | ICD-10-CM

## 2010-06-29 NOTE — Procedures (Signed)
EEG NUMBER:  HISTORY:  An 77 year old male status post syncope with confusion.  MEDICATIONS:  Tenormin, Avelox, Zocor, and multivitamin.  CONDITION OF RECORDING:  This is a 16-channel EEG carried out with the patient in the drowsy and asleep states.  DESCRIPTION:  During drowse, the background activity is poorly organized and consists mostly of a mixture of theta and delta rhythms.  The patient goes into a light sleep with symmetrical sleep spindles.  The vertex was a sharp activity and E-wave was slow activity.  No epileptiform activity was noted.  Hyperventilation was not performed. Intermittent photic stimulation failed to elicit any change in the tracing.  Wakefulness could not be evaluated during this tracing.  IMPRESSION:  This is a normal drowsy and asleep EEG.  No epileptiform activity is noted.          ______________________________ Thana Farr, MD    [id number] D:  06/28/2010 18:37:32  T:  06/29/2010 02:01:23  Job #:  [id number]

## 2010-07-03 LAB — CULTURE, BLOOD (ROUTINE X 2)
Culture  Setup Time: 201204250105
Culture: NO GROWTH

## 2010-07-05 NOTE — Discharge Summary (Signed)
NAMEDAN, SCEARCE                 ACCOUNT NO.:  [id number]  MEDICAL RECORD NO.:  [id number]           PATIENT TYPE:  I  LOCATION:  4715                         FACILITY:  [facility]  PHYSICIAN:  Jeoffrey Massed, MD    DATE OF BIRTH:  March 09, 1921  DATE OF ADMISSION:  06/26/2010 DATE OF DISCHARGE:                        DISCHARGE SUMMARY - REFERRING   PRIMARY CARE PRACTITIONER:  [place]. [place]  PRIMARY CARDIOLOGIST:  Luis Abed, MD, [facility] from [place].  PRIMARY VASCULAR SURGEON:  Janetta Hora. Fields, MD  PRIMARY DISCHARGE DIAGNOSES: 1. Syncope. 2. Two small subependymal intracranial hemorrhages. 3. Delirium, now resolved. 4. Pneumonia. 5. Debility and deconditioning.  SECONDARY DISCHARGE DIAGNOSES: 1. Coronary artery disease status post coronary artery bypass graft. 2. Dyslipidemia. 3. Recurrent syncope in the past. 4. Chronic venous insufficiency. 5. Mild carotid artery disease. 6. Popliteal aneurysm repair by Dr. Darrick Penna in September 2010. 7. Questionable transient ischemic attack in January 2011.  DISCHARGE MEDICATIONS: 1. Avelox 400 mg 1 tablet p.o. daily. 2. MiraLax 17 g p.o. daily. 3. Calcium carbonate 500 mg 2 tablets p.o. daily. 4. Multivitamins 1 tablet p.o. daily. 5. Zocor 10 mg 1 tablet p.o. daily.  CONSULTATIONS: 1. Neurology. 2. [facility].  BRIEF HISTORY OF PRESENT ILLNESS:  The patient is an 77 year old white male with a past medical history of recurrent syncope, coronary artery disease status post CABG, hypertension, who was brought in to the hospital for a syncopal episode.  A CT of the head on admission showed 2 subependymal hemorrhages along the right lateral ventricle and the patient was then admitted to the Hospitalist Service for further evaluation and treatment.  For further details, please see the history and physical that was dictated by Dr. Onalee Hua on admission.  PERTINENT RADIOLOGICAL STUDIES: 1. CT  of the head without contrast done on June 26, 2010, showed 2     subependymal hemorrhages along the right lateral ventricle. 2. Chest x-ray, two view, showed cardiomegaly and low lung volumes     with moderate left hemidiaphragm elevation.  Probable atelectasis     over the left hemidiaphragm.  If there is a concern for infection     or aspiration, consider short-term radiologic followup. 3. CT spine without contrast showed no acute findings. 4. MRI of the head without contrast showed 2 unusual right lateral     ventricle subependymal lesions.  I do favor and these are small     hemorrhages I suspected on the recent CT given the absence in 2010,     and suggestion of trace intraventricular hemorrhage on the study.     No mass effect.  No other extra-axial blood.  Chronic     ventriculomegaly. 5. MRA of the head without contrast showed mild intracranial artery     ectasia and atherosclerosis, otherwise negative intracranial MRA. 6. A 2-D echocardiogram done on June 29, 2010, showed a technically     difficult study with poor acoustic windows.  Normal LV size with     mild LV hypertrophy.  EF around 55%.  Normal RV size and systolic  function.  No significant valvular abnormalities. 7. Carotid Doppler showed no significant extracranial artery stenosis     demonstrated.  Mild heterogeneous block noted bilaterally.     Vertebrals are patent with anterograde flow. 8. EEG done on June 28, 2010, showed a normal drowsy and asleep EEG.     No epileptiform activity is noted.  BRIEF HOSPITAL COURSE: 1. Syncope.  Apparently, this issue is a recurrent one unfortunately     for the patient.  The patient was admitted, monitored on telemetry     which was essentially negative.  A 2-D echocardiogram was done, the     results of which are noted as above.  [facility] was     consulted and the consult was provided by Dr. Myrtis Ser and Dr. Tenny Craw.     Dr. Myrtis Ser did evaluate the patient yesterday and  suggested that if     there were no new changes on his echocardiogram, he could be     discharged without the need for further workup.  He did suggest to     stop the atenolol as the patient had a brief episode of bradycardia     in the 50s.  The patient's blood pressure without atenolol has been     very well controlled.  He will follow up with Dr. Myrtis Ser in 1-2 weeks     for further continued care and further workup if necessary. 2. Intracranial hemorrhage.  These were 2 tiny small ependymal lesions     and are thought to be secondary to the patient's fall.  The patient     was evaluated by Neurology.  They suggested to stop aspirin and any     form of anticoagulation for at least 2-3 weeks.  His aspirin was     then stopped.  He also underwent an MRI/MRA and carotid Dopplers,     the results of which, are essentially noted as above.  Current     plans are to continue holding his aspirin for at least 2 more weeks     and resume it once he follows up with his primary cardiologist or     his primary care practitioner. 3. Possible pneumonia.  The patient will complete a 7-day course of     Avelox.  He is afebrile and nontoxic looking.  He does have a cough     that is slowly improving. 4. Coronary artery disease.  This is stable.  Unfortunately, given his     intracranial bleeding issues, aspirin will need to be stopped for 2     weeks.  At this time, he will continue with Zocor. 5. Dyslipidemia.  Zocor. 6. Debility and deconditioning.  Physical Therapy Services did     evaluate this patient during his stay here and they have     recommended home health PT/OT.  I have had a long discussion with     the patient's wife, the patient, the patient's 2 daughters in     detail and I have told them that if there is a fall issue and if     the patient's family will not be able to take care of him because     of this issue, then he should probably go to a skilled nursing     facility short term for  rehab.  At this time, I have declined this     and we are willing to be give home health PT/OT for now as they  have done this before.  Per my discussion with the daughter, the     family will provide further care at home for at least a week or so.  DISPOSITION:  At this time, the patient is thought stable to be discharged home.  FOLLOWUP INSTRUCTIONS: 1. The patient will follow up with his primary care practitioner, Dr.     [name] within 1-2 weeks upon discharge, he is to call and     make an appointment. 2. The patient to follow up with Dr. Myrtis Ser, [facility] within 1-     2 weeks upon discharge, the patient is to call and make an     appointment, they claim understanding.  Please note that this patient's aspirin has been held because of 2 subependymal hemorrhages as noted in the CAT scan and MRI above and as per Neurology instructions, these can be restarted within 2-3 weeks.  TOTAL TIME SPENT COORDINATING DISCHARGE:  Forty five minutes.     Jeoffrey Massed, MD     SG/MEDQ  D:  06/30/2010  T:  06/30/2010  Job:  [id number]  cc:   Luis Abed, MD, [facility] Darrick Penna, MD Dr. Steele Sizer  Electronically Signed by Jeoffrey Massed  on 07/05/2010 03:36:42 PM

## 2010-07-13 ENCOUNTER — Encounter: Payer: Self-pay | Admitting: Cardiology

## 2010-07-15 ENCOUNTER — Encounter: Payer: Self-pay | Admitting: Cardiology

## 2010-07-15 DIAGNOSIS — I872 Venous insufficiency (chronic) (peripheral): Secondary | ICD-10-CM | POA: Insufficient documentation

## 2010-07-15 DIAGNOSIS — R001 Bradycardia, unspecified: Secondary | ICD-10-CM | POA: Insufficient documentation

## 2010-07-15 DIAGNOSIS — R0989 Other specified symptoms and signs involving the circulatory and respiratory systems: Secondary | ICD-10-CM | POA: Insufficient documentation

## 2010-07-15 DIAGNOSIS — I629 Nontraumatic intracranial hemorrhage, unspecified: Secondary | ICD-10-CM | POA: Insufficient documentation

## 2010-07-15 DIAGNOSIS — R748 Abnormal levels of other serum enzymes: Secondary | ICD-10-CM | POA: Insufficient documentation

## 2010-07-15 DIAGNOSIS — IMO0002 Reserved for concepts with insufficient information to code with codable children: Secondary | ICD-10-CM | POA: Insufficient documentation

## 2010-07-15 DIAGNOSIS — R9430 Abnormal result of cardiovascular function study, unspecified: Secondary | ICD-10-CM | POA: Insufficient documentation

## 2010-07-15 DIAGNOSIS — R55 Syncope and collapse: Secondary | ICD-10-CM | POA: Insufficient documentation

## 2010-07-15 DIAGNOSIS — E785 Hyperlipidemia, unspecified: Secondary | ICD-10-CM | POA: Insufficient documentation

## 2010-07-15 DIAGNOSIS — I724 Aneurysm of artery of lower extremity: Secondary | ICD-10-CM | POA: Insufficient documentation

## 2010-07-15 DIAGNOSIS — I739 Peripheral vascular disease, unspecified: Secondary | ICD-10-CM

## 2010-07-15 DIAGNOSIS — I2510 Atherosclerotic heart disease of native coronary artery without angina pectoris: Secondary | ICD-10-CM | POA: Insufficient documentation

## 2010-07-15 DIAGNOSIS — R479 Unspecified speech disturbances: Secondary | ICD-10-CM | POA: Insufficient documentation

## 2010-07-15 DIAGNOSIS — Z951 Presence of aortocoronary bypass graft: Secondary | ICD-10-CM | POA: Insufficient documentation

## 2010-07-16 ENCOUNTER — Encounter: Payer: Self-pay | Admitting: Cardiology

## 2010-07-16 ENCOUNTER — Ambulatory Visit (INDEPENDENT_AMBULATORY_CARE_PROVIDER_SITE_OTHER): Payer: Medicare Other | Admitting: Cardiology

## 2010-07-16 DIAGNOSIS — R55 Syncope and collapse: Secondary | ICD-10-CM

## 2010-07-16 DIAGNOSIS — I498 Other specified cardiac arrhythmias: Secondary | ICD-10-CM

## 2010-07-16 DIAGNOSIS — R001 Bradycardia, unspecified: Secondary | ICD-10-CM

## 2010-07-16 DIAGNOSIS — I2510 Atherosclerotic heart disease of native coronary artery without angina pectoris: Secondary | ICD-10-CM

## 2010-07-16 DIAGNOSIS — I779 Disorder of arteries and arterioles, unspecified: Secondary | ICD-10-CM

## 2010-07-16 NOTE — Progress Notes (Signed)
HPI Patient is seen post hospitalization.  He had another episode of syncope.  On this occasion it was not witnessed.  The neurology evaluation did show 2 small areas of bleeding.  His aspirin was held.  It was felt that he could resume his aspirin at a later date.  Also while in the hospital he had some bradycardia.  His atenolol was held.  He has chronic peripheral edema.  He does not have shortness of breath.  His family describes slight increase in the edema in his right foot.  As part of today's evaluation I have reviewed extensive hospital records.  I reviewed the H&P and discharge summary and the echo report and the labs. Allergies  Allergen Reactions  . Penicillins   . Sulfonamide Derivatives     Current Outpatient Prescriptions  Medication Sig Dispense Refill  . Calcium Carbonate (CALCIUM 500 PO) 2 tabs po qd       . Multiple Vitamin (MULTIVITAMIN) capsule Take 1 capsule by mouth daily.        . simvastatin (ZOCOR) 10 MG tablet Take 10 mg by mouth at bedtime.        Marland Kitchen DISCONTD: aspirin 81 MG tablet Take 81 mg by mouth daily.        Marland Kitchen DISCONTD: atenolol (TENORMIN) 25 MG tablet Take 25 mg by mouth daily.          History   Social History  . Marital Status: Married    Spouse Name: N/A    Number of Children: N/A  . Years of Education: N/A   Occupational History  . Not on file.   Social History Main Topics  . Smoking status: Former Games developer  . Smokeless tobacco: Not on file   Comment: quit appox 50 yrs ago  . Alcohol Use: No  . Drug Use: No  . Sexually Active: Not on file   Other Topics Concern  . Not on file   Social History Narrative  . No narrative on file    Family History  Problem Relation Age of Onset  . Diabetes Sister   . Stroke Mother   . Diabetes Mother     Past Medical History  Diagnosis Date  . CAD (coronary artery disease)     nuclear 01/2008, no ischemia, EF 60%, distal anterior scar  . Hyperlipidemia   . Syncope     .remote..no etiology  /   syncope 11/2008.. from acute leg pain  . Venous insufficiency     support hose  . Carotid artery disease     doppler 05/2009,  0-39% bilateral / doppler 06/2010 OK  . Elevated CPK     11/23/08.Marland KitchenMarland Kitchenprobably from debris from popliteal aneurysm  . Popliteal aneurysm     .repair..11/25/2008  Dr. Darrick Penna  . Speech problem     Neurologic event with brief infusion and speech difficulty.... January, 2011.... being assessed by neurology  February, 2011  . Hx of CABG     1998  . Ejection fraction     EF 45%, echo,05/2009,apical hypo,  ?? posterior and lateral hypo  . Prominent abdominal aortic pulsation     doppler normal  . Intracranial hemorrhage     Two small subependymal bleeds 06/2010,,ASA held  . Bradycardia     06/2010    Past Surgical History  Procedure Date  . Coronary artery bypass graft 1998  . Popliteal bypass  with reversed greater saphenous vein from right leg.     ROS  Patient denies fever, chills,  headache, sweats, rash, change in vision, change in hearing, chest pain, cough, nausea vomiting, urinary symptoms.  All other systems are reviewed and are negative.  PHYSICAL EXAM Patient is stable today.  He is here with his wife and daughter.  He is oriented to person time and place.  Affect is normal.Head is atraumatic.  There is no xanthelasma.  There is no jugular venous distention.  Lungs are clear.  Respiratory effort is nonlabored.  Cardiac exam reveals an S1 and S2.  There are no clicks or significant murmurs.  Abdomen is soft.  There is 1+ peripheral edema.  There are no musculoskeletal deformities.  There no skin rashes. Filed Vitals:   07/16/10 1624  BP: 136/85  Pulse: 83  Resp: 18  Height: 5\' 11"  (1.803 m)  Weight: 213 lb (96.616 kg)    EKG  EKG is not done today.  ASSESSMENT & PLAN

## 2010-07-16 NOTE — Patient Instructions (Addendum)
Your physician recommends that you schedule a follow-up appointment in: 6 weeks with Dr. Myrtis Ser Continue medications as listed on your list. Do not resume aspirin and atenolol

## 2010-07-16 NOTE — Assessment & Plan Note (Signed)
Coronary disease is stable. No change in therapy. 

## 2010-07-16 NOTE — Assessment & Plan Note (Signed)
The patient was bradycardic in the hospital.  His beta blocker was stopped.  His heart rate has been under good control at home.  We will not restart his beta blocker at this time.  I will see him for followup in 6 weeks.

## 2010-07-16 NOTE — Assessment & Plan Note (Signed)
Over the years he's had syncope from different etiologies.  With this most recent episode he had 2 very small areas of intracranial bleeding.  It is still not clear to me what came first.  For now I feel it is most prudent to not restart his aspirin.

## 2010-07-16 NOTE — Assessment & Plan Note (Signed)
The patient had followup carotid Dopplers in the hospital and there was no marked change.

## 2010-07-17 NOTE — Assessment & Plan Note (Signed)
[facility]                            CARDIOLOGY OFFICE NOTE   AULTON, ROUTT                        MRN:          [id number]  DATE:01/12/2008                            DOB:          October 13, 1921    Mr. Levitt is here for followup, and he is doing very well.  He has known  coronary artery disease.  He is going about full activities.  When he  walks, he gets cramps in his legs.  He says, however, that he continues  to exercise.  I have discussed this issue at length with him and his  daughter and wife, and we have decided to do leg Dopplers to see if  there is any approachable abnormalities.   He is not having any significant chest pain or shortness of breath.  He  has not had any syncope or presyncope.  I followed him for his coronary  artery disease.  He is post CABG in 1998.  He also has a history of  syncope in the past.  Fortunately, we did not find a significant  arrhythmia and this stabilized, and he has never had a recurrence.   PAST MEDICAL HISTORY:   ALLERGIES:  PENICILLIN and SULFA.   MEDICATIONS:  Multivitamin, atenolol, simvastatin, aspirin, glucosamine,  and a stool softener.   OTHER MEDICAL PROBLEMS:  See the complete list below.   REVIEW OF SYSTEMS:  He is not having any GI or GU symptoms.  He has no  fevers or chills.  There are no skin rashes.  He has no headaches.  Otherwise, his review of systems is negative.   PHYSICAL EXAMINATION:  VITAL SIGNS:  Blood pressure is 115/68 with a  pulse of 59.  Weight is 217 pounds, which is down 2 pounds from last  year.  GENERAL:  The patient is oriented to person, time, and place.  Affect is  normal.  HEENT:  No xanthelasma.  He has normal extraocular motion.  NECK:  There are no carotid bruits.  There is no jugular venous  distention.  LUNGS:  Clear.  Respiratory effort is not labored.  CARDIAC:  S1 with an S2.  There are no clicks or significant murmurs.  ABDOMEN:  Soft.  He has  trace peripheral edema.  He is wearing support  hose.   An EKG reveals sinus bradycardia with nonspecific ST-T wave changes.   Labs sent from Dr. Dossie Arbour, revealed that the patient's cholesterol is  nicely treated, and he is within goal.   PROBLEMS:  1. Coronary artery disease, post CABG in 1998.  He has no symptoms.  I      have once again decided not to press for any testing.  2. Hyperlipidemia, treated.  3. History of syncope in the past.  There was no obvious significant      etiology, and he had no recurrence.  4. History of muscle aches and pains.  His symptoms now in his leg      that do sounds like some claudication.  However, he is able to walk  through his pain most of the time.  We will obtain leg Dopplers to      be sure that there is not any major problem.  5. History of good LV function.  6. History of venous insufficiency, and he wears support hose.  7. History of abdominal ultrasound in the past to be sure that he had      normal caliber aorta and it was normal.  8. History of mild carotid disease.  He did have a followup Doppler.      This was done in 2008, and showed no significant abnormalities.   Overall, Mr. Lebarron is doing well.  We will obtain arterial leg Dopplers,  and I will see him back for followup in 1 year.     Luis Abed, MD, [facility]  Electronically Signed    JDK/MedQ  DD: 01/12/2008  DT: 01/13/2008  Job #: [id number]   cc:   Dr. [name]

## 2010-07-17 NOTE — Procedures (Signed)
BYPASS GRAFT EVALUATION   INDICATION:  Followup bypass graft.   HISTORY:  Diabetes:  No.  Cardiac:  No.  Hypertension:  No.  Smoking:  Previous.  Previous Surgery:  Right above the knee to below knee bypass graft.   SINGLE LEVEL ARTERIAL EXAM                               RIGHT              LEFT  Brachial:                    134                135  Anterior tibial:             151                153  Posterior tibial:            154                156  Peroneal:  Ankle/brachial index:        1.14               1.16   PREVIOUS ABI:  Date:  11/09/2009  RIGHT:  1.14  LEFT:  1.24   LOWER EXTREMITY BYPASS GRAFT DUPLEX EXAM:   DUPLEX:  Biphasic waveforms throughout the bypass graft.   IMPRESSION:  1. Patent right above knee to below knee bypass graft with no evidence      of stenosis.  2. Baker's cyst measures 5.18 cm x 3.46 cm.  3. 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   ___________________________________________  Janetta Hora. Fields, MD   EM/MEDQ  D:  02/09/2010  T:  02/09/2010  Job:  [id number]

## 2010-07-17 NOTE — Procedures (Signed)
BYPASS GRAFT EVALUATION   INDICATION:  Followup of a right popliteal bypass graft.   HISTORY:  Diabetes:  No.  Cardiac:  CABG.  Hypertension:  No.  Smoking:  Previous.  Previous Surgery:  Right above-knee to below-knee popliteal bypass  graft, 12/06/2008.   SINGLE LEVEL ARTERIAL EXAM                               RIGHT              LEFT  Brachial:                    133                142  Anterior tibial:             148                166  Posterior tibial:            163                161  Peroneal:  Ankle/brachial index:        1.15               1.17   PREVIOUS ABI:  Date: 03/30/2009  RIGHT:  1.18  LEFT:  1.22   LOWER EXTREMITY BYPASS GRAFT DUPLEX EXAM:   DUPLEX:  Triphasic waveforms in proximal and mid bypass grafts, native  arteries distal and distal anastomosis of the bypass graft could not be  visualized due to fluids.   IMPRESSION:  1. Proximal and mid bypass graft is patent with no evidence of      stenosis.  2. Ankle brachial indices within normal limits.  3. Fluid pocket measures 8.25 X 3.17 cm.  4. Popliteal aneurysm measuring 2.67 X 2.81 cm.   ___________________________________________  Janetta Hora. Fields, MD   CJ/MEDQ  D:  06/07/2009  T:  06/07/2009  Job:  [id number]

## 2010-07-17 NOTE — Assessment & Plan Note (Signed)
OFFICE VISIT   [name] [name], [name] [name]  DOB:  October 04, 1921                                       06/07/2009  NFAOZ#:[id number]   The patient returns for followup today.  He previously underwent a right  above knee to below knee popliteal bypass in October of 2010 for  popliteal aneurysm.  He returns today for further followup.  He  apparently had a fall recently and has since that time had some pain  over the lateral aspect of his right thigh in the distal portion.  He  denies any claudication symptoms.  He denies any rest pain.  Overall he  feels fairly well.   PHYSICAL EXAM:  Today blood pressure is 123/77 in the left arm,  temperature is 97.9, heart rate 62.  Both feet are pink, warm and well-  perfused.  He does have edema in both feet bilaterally.  When milking  this edema away though he does have a 1+ posterior tibial pulse on the  right side.  He has no obvious ecchymosis on the right lateral thigh.  There is no point tenderness in this area.   He had bilateral ABIs performed today which were 1.15 on the right, 1.17  on the left.  He had a graft duplex scan which showed the graft is  widely patent with no significant stenosis.  He also recently had a CT  angiogram of the abdomen and pelvis on 05/03/2009 which again showed no  significant stenosis in his bypass graft which was widely patent and  runoff via the posterior tibial artery.  CT scan was otherwise fairly  unremarkable except for bilateral renal cysts and some enlargement of  the prostate gland.  The right knee did have a Baker's cyst.   In summary, the patient is doing well after his right leg bypass  procedure.  His aneurysm is well excluded.  He has a widely patent  bypass.  We will now place him in our graft surveillance protocol.  He  will return in 3 months for a graft scan followup.  He will return  sooner if he has any problems.     Janetta Hora. Fields, MD  Electronically Signed   CEF/MEDQ  D:  06/07/2009  T:  06/08/2009  Job:  [id number]   cc:   Luis Abed, MD, [facility]

## 2010-07-17 NOTE — Assessment & Plan Note (Signed)
[facility]                            CARDIOLOGY OFFICE NOTE   GAEGE, SANGALANG                        MRN:          [id number]  DATE:01/26/2007                            DOB:          04-19-1921    [name] [name] is doing very well. He is not having any chest pain or  shortness of breath. He is not having any syncope or pre-syncope. He is  going about full activities. He is followed carefully by Dr. Dossie Arbour.  He is here with his wife and daughter today.   He is post CABG. He is on appropriate medications.   PAST MEDICAL HISTORY:   ALLERGIES:  PENICILLIN AND SULFA.   MEDICATIONS:  1. Multivitamin.  2. Atenolol 25.  3. Simvastatin.  4. Aspirin.  5. Calcium.  6. Glucosamine.   OTHER MEDICAL PROBLEMS:  See the list below.   REVIEW OF SYSTEMS:  He has no significant complaints.   PHYSICAL EXAMINATION:  Blood pressure 147/79, pulse 64. Weight on our  scale is 219 pounds as compared to 221 pounds last year. He is still  encouraged to lose some weight.  HEENT: Reveals no xanthelasma. He has normal extraocular motion. There  are no carotid bruits. There is no jugular venous distention.  He is oriented x3. Affect is normal.  LUNGS:  Are clear. Respiratory effort is not labored.  CARDIAC: Reveals an S1, with an S2. There are no clicks or significant  murmurs.  ABDOMEN: Protuberant, but soft.  He has no significant peripheral edema. He is wearing his support hose  today.   PROBLEM LIST:  1. Coronary disease with coronary artery bypass graft in 1998. He has      no significant symptoms. I have not pressed for significant      followup testing. I would like to consider proceeding with a      Myoview scan possibly around the time that I see him next year.  2. Hyperlipidemia, treated.  3. History of syncope in the past. Fortunately, we felt that this was      not from a significant arrhythmia and he is stabilized.  4. History of some muscle aches  and pains. We believe it is not      related to his medications.  5. History of good left ventricular function.  6. Venous insufficiency and he does wear support hose.  7. Abdominal pulsation that was prominent. He had a normal caliber      aorta by ultrasound in the past.  8. 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 The last Doppler that I have is from      2006. He did have a followup study through Dr. Dossie Arbour in 2007 and      I have asked this result be sent to me.   [name] [name] is stable. I have not recommended any changes in his therapy.  We will consider a stress Myoview scan next year. He will call if he has  any significant problems.   There was a question as to whether or not he could receive the herpes  zoster  immunization. From a cardiac viewpoint, this will be fine.     Luis Abed, MD, [facility]  Electronically Signed    JDK/MedQ  DD: 01/26/2007  DT: 01/26/2007  Job #: [id number]   cc:   [name], MD

## 2010-07-17 NOTE — Assessment & Plan Note (Signed)
OFFICE VISIT   [name] [name], [name] [name]  DOB:  03/03/1922                                       05/03/2009  EAVWU#:[id number]   The patient returns for followup today.  He recently had a duplex  ultrasound which showed some flow into his popliteal aneurysm.  He had a  CT angiogram today to further define this anatomy.  Unfortunately he had  a recent fall and required some sutures on the bridge of his nose but  overall is doing well.  He has had no problems whatsoever with his leg.  He denies any posterior knee pain.  He denies any pain in his foot.  He  denies any claudication.   PHYSICAL EXAM:  Vital signs:  Blood pressure is 129/78 in the left arm,  heart rate 67 and regular.  Oxygen saturation is 88% on room air,  respirations 20.  HEENT:  Remarkable for some ecchymosis bilaterally  just below his nose on either side in the cheeks and sutures across the  bridge of his nose.   I reviewed his CT angiogram of the abdomen and pelvis with lower  extremity runoff today.  This is remarkable for a right renal cyst.  The  bypass in the right leg is widely patent at the proximal and distal  ends.  There is a trickle of flow into the aneurysm sac from what  appears to be a geniculate branch.  However, the aneurysm has not  changed in size and there is minimal flow within this and most of the  aneurysm is completely thrombosed.  I reassured the patient and his  family today that sometimes a geniculate branch can continue to fill  this.  There should be no risk of distal embolization as there is no  flow distally from the aneurysm.  There should also be minimal risk of  aneurysm enlargement and rupture over time.  I did encourage him though  that he should continue to return for graft surveillance to make sure  that he has no renarrowing of the graft and also to make sure that the  aneurysm does not expand over time.  He will follow up with [place] in April  for an additional  graft scan.     Janetta Hora. Fields, MD  Electronically Signed   CEF/MEDQ  D:  05/03/2009  T:  05/04/2009  Job:  [id number]

## 2010-07-17 NOTE — Procedures (Signed)
BYPASS GRAFT EVALUATION   INDICATION:  Followup graft placement of right popliteal aneurysm.   HISTORY:  Diabetes:  No.  Cardiac:  No.  Hypertension:  No.  Smoking:  No.  Previous Surgery:  Right above knee to below knee bypass graft.   SINGLE LEVEL ARTERIAL EXAM                               RIGHT              LEFT  Brachial:                    126                132  Anterior tibial:             144                155  Posterior tibial:            150                164  Peroneal:  Ankle/brachial index:        1.14               1.24   PREVIOUS ABI:  Date:  06/07/2009  RIGHT:  1.15  LEFT:  1.17   LOWER EXTREMITY BYPASS GRAFT DUPLEX EXAM:   DUPLEX:  Biphasic waveforms in proximal and mid bypass graft.  Distally, native arteries and distal anastomosis could not be visualized  due to fluid from Baker's cyst.   IMPRESSION:  1. Proximal and mid bypass graft is patent with no evidence of      stenosis.  2. Baker's cyst measures 5.41 x 2.95 cm.  3. Popliteal aneurysm measures 2.64 x 3.96 cm.       ___________________________________________  Janetta Hora. Fields, MD   EM/MEDQ  D:  11/09/2009  T:  11/09/2009  Job:  [id number]

## 2010-07-17 NOTE — Assessment & Plan Note (Signed)
OFFICE VISIT   JMARION, [name] [name]  DOB:  1921-04-24                                       12/28/2008  WUJWJ#:[id number]   Patient returns for follow-up today.  He recently underwent above-knee  to below-knee popliteal bypass for popliteal aneurysm.  He presents  today for further follow-up.   On exam today, his right groin incision above-knee and below-knee  incisions are all well-healed.  His lower extremities are diffusely  edematous, which is near his baseline.  He has edema.  Pulses are  difficult to palpate in his feet.  However, he had triphasic Doppler  waveforms in his right and left leg today with ABIs greater than 1  bilaterally.   Overall, patient appears to be healing well.  We will place him in our  graft surveillance protocol.  I have also informed him he could restart  his atenolol today.  This had been held in the hospital because of  hypotension.  Blood pressure today was 136/86 with a heart rate of 91.  He has follow-up scheduled with Dr. Myrtis Ser next week to recheck his blood  pressure.   Janetta Hora. Fields, MD  Electronically Signed   CEF/MEDQ  D:  12/28/2008  T:  12/29/2008  Job:  [id number]   cc:   Luis Abed, MD, [facility]

## 2010-07-17 NOTE — Assessment & Plan Note (Signed)
OFFICE VISIT   [name] [name], [name] [name]  DOB:  07/09/1921                                       04/12/2009  WUJWJ#:[id number]   The patient returns for followup today.  He had a recent graft duplex  scan which showed that he still had some flow into his right popliteal  aneurysm and this was thought to be via the popliteal artery.  This scan  was done on 03/30/2009.  The aneurysm was 2.5 x 2.5 cm in diameter.  His  ABIs at that time were normal.  His previous above knee to below knee  popliteal bypass has primarily posterior tibial artery runoff.  The  graft had no flow limiting stenosis.  The patient denies any  claudication symptoms.  He denies any rest pain.  He is primarily here  today to discuss the findings of his duplex exam.   PHYSICAL EXAM:  Blood pressure is 133/78 in the left arm, heart rate 66  and regular.  Temperature is 98.3.  On physical exam right lower  extremity has a palpable popliteal pulse but this is not full in  character.  Right foot is pink, warm and well-perfused.  He has some  trace edema and posterior tibial pulse is not easily palpable.  Incisions on the medial aspect of his right leg are well-healed.   I had a lengthy discussion today with the patient, his daughter and his  wife.  I discussed with him that it would still be possible for some  flow to be going into the popliteal artery especially if there are  geniculate branches that have fed this from collaterals.  However, I  informed him that the only risk is if the popliteal artery is still in  continuity that he could have distal embolization from this.  He is not  at risk for rupture or the risk of rupture is extremely low.  From my  operative note the proximal popliteal artery was ligated.  From my  recollection as far as I know the distal popliteal artery was ligated as  well but it is not specifically mentioned in the note.  In light of this  we will obtain a CT angiogram to  make sure that the distal popliteal  artery does not retrograde fill the aneurysm.  If this is filling only  by geniculate branches I do not believe any further intervention is  necessary.  He will follow up with me after his CT angiogram.     Janetta Hora. Fields, MD  Electronically Signed   CEF/MEDQ  D:  04/12/2009  T:  04/13/2009  Job:  [id number]

## 2010-07-17 NOTE — Procedures (Signed)
BYPASS GRAFT EVALUATION   INDICATION:  Followup right popliteal aneurysm repair.   HISTORY:  Diabetes:  No.  Cardiac:  CABG.  Hypertension:  No.  Smoking:  Previous.  Previous Surgery:  Right above knee to below knee popliteal artery  bypass graft 12/06/2008 by Dr. Darrick Penna.   SINGLE LEVEL ARTERIAL EXAM                               RIGHT              LEFT  Brachial:                    141                137  Anterior tibial:             164                172  Posterior tibial:            167                171  Peroneal:  Ankle/brachial index:        1.18               1.22   PREVIOUS ABI:  Date:  12/28/2008  RIGHT:  1.25  LEFT:  1.23   LOWER EXTREMITY BYPASS GRAFT DUPLEX EXAM:   DUPLEX:  Patent right above knee to below knee popliteal artery bypass  graft.  Flow noted into the right popliteal artery aneurysm via popliteal  artery.  Native popliteal artery aneurysm sac measures 2.46 cm x 2.55 cm.   IMPRESSION:  1. Patent right popliteal artery above knee to below knee popliteal      artery bypass graft.  2. Flow noted in right popliteal artery aneurysm, through popliteal      artery.  3. Bilateral ankle brachial indices appear within normal limits and      stable.  4. Appointment scheduled to see Dr. Darrick Penna 04/12/2009, okayed per Dr.      Edilia Bo.   ___________________________________________  Janetta Hora Fields, MD   AS/MEDQ  D:  03/30/2009  T:  03/30/2009  Job:  [id number]

## 2010-07-20 NOTE — Assessment & Plan Note (Signed)
[facility]                            CARDIOLOGY OFFICE NOTE   [name], [name]                        MRN:          [id number]  DATE:02/04/2006                            DOB:          Jul 04, 1921    [name] [name] is doing very well.  I saw him last in December 2006.  He has  not had any chest pain.  He has no syncope or presyncope.  He is going  about full activities.  He is here with his wife today, who watches over  him very carefully in terms of his diet and his exercise program.  He is  3 pounds lighter than last year.   He has known coronary disease but has been stable.   PAST MEDICAL HISTORY:  Allergies:  PENICILLIN and SULFA.   Medications:  Multivitamins, atenolol, simvastatin, aspirin, and  calcium.   Other medical problems:  See the list below.   REVIEW OF SYSTEMS:  He is feeling well.  He has slight swelling in his  left ankle that is chronic.  Otherwise, his review of systems is  negative.   PHYSICAL EXAMINATION:  Weight is 221 pounds on our scale today.  Blood  pressure 140/78 with a pulse of 63.  The patient is oriented to person,  time and place and his affect is normal.  Lungs are clear.  Respiratory  effort is not labored.  He has no xanthelasma.  There is normal  extraocular motion.  I do not hear any carotid bruits today.  There is  no jugular venous distention.  Cardiac exam reveals an S1 with an S2.  There are no clicks or significant murmurs.  His abdomen is soft.  There  are no masses or bruits.  There are normal bowel sounds.  The patient  has large musculature and soft tissue in his legs in general.  He has  slight swelling around his left ankle.   PROBLEMS:  1. Coronary disease post coronary artery bypass grafting in 1998.  2. Hyperlipidemia, being treated.  3. Episode of syncope in the past.  We did not think it was a major      event.  4. Muscle aches and pains.  We do not believe it is related to      medicines  and this is stable.  5. History of good left ventricular function.  6. Venous insufficiency, and he wears support hose.  7. Abdominal pulsation that was prominent.  He did have abdominal      ultrasound in December 2006 that showed a normal caliber aorta.  8. History of mild carotid disease.  I will pass the word on to Dr.      Dossie Arbour to ask him to arrange for a carotid Doppler in his area.   [name] [name] is stable and I can see him back in 1 year.     Luis Abed, MD, [facility]  Electronically Signed    JDK/MedQ  DD: 02/04/2006  DT: 02/04/2006  Job #: [id number]   cc:   [name], M.D.

## 2010-07-26 NOTE — Consult Note (Signed)
NAMEBANYAN, GOODCHILD NO.:  [id number]  MEDICAL RECORD NO.:  [id number]           PATIENT TYPE:  I  LOCATION:  4715                         FACILITY:  [facility]  PHYSICIAN:  Pricilla Riffle, MD, FACCDATE OF BIRTH:  10/23/1921  DATE OF CONSULTATION:  06/28/2010 DATE OF DISCHARGE:                                CONSULTATION   IDENTIFICATION:  The patient is an 77 year old who we are asked to see regarding syncope.  The patient has a history of coronary artery disease status post CABG in 1999.  Myoview in 2009 showed no ischemia.  Echocardiogram in March 2011 showed an LVEF of 45% with severe LVH.  The patient was home on June 26, 2010, about 5 p.m., he collapsed and passed out.  He was confused on awakening.  Blood pressure by EMS on arrival was 80 systolic.  It increased to 140-160 on arrival to the emergency room.  CT of the head showed 2 small hemorrhages that are felt to be traumatic.  His aspirin was discontinued.  Note, carotid Dopplers showed no significant stenosis.  The daughters report that the patient is much more alert today.  He denies shortness of breath.  No palpitations.  No chest pain.  ALLERGIES:  SULFA and PENICILLIN.  MEDICATIONS:  Aspirin 81 mg daily.  Atenolol 25.  PAST MEDICAL HISTORY: 1. CAD as noted above. 2. Dyslipidemia. 3. PVOD, status post right lower extremity bypass in 2010. 4. Syncope.  The patient had remote episodes without etiology in     September 2010.  Acute leg pain led to syncope, found to have     peripheral vascular disease.  SOCIAL HISTORY:  The patient is married.  Retired.  Lives with his wife. Quit tobacco 50 years ago.  Does not drink.  FAMILY HISTORY:  Significant for diabetes and CVA in the mother.  Father died of an MI.  REVIEW OF SYSTEMS:  The patient notes occasional cough.  Otherwise, all systems reviewed, negative to the above problem except as noted above.  PHYSICAL EXAMINATION:  GENERAL:  On  exam, the patient is in no acute distress. VITAL SIGNS:  Blood pressure 138/80, pulse 60 and regular, and temperature is 98.  Orthostatics on arrival to the hospital, blood pressure lying 130/74 and pulse 114, sitting 131/73 and pulse 73, standing immediately 116/74 and pulse 85, standing at 2 minutes 113/73 and pulse 80. HEENT:  Normocephalic and atraumatic.  EOMI.  PERRL. NECK:  JVP is normal.  No bruits.  No thyromegaly. LUNGS:  Relatively clear to auscultation. CARDIAC:  Regular rate and rhythm.  S1 and S2.  No S3.  No significant murmurs. ABDOMEN:  Supple, nontender.  Normal bowel sounds.  No hepatomegaly. EXTREMITIES:  2+ distal pulses.  No lower extremity edema. NEURO:  Alert and oriented x3.  Cranial nerves II-XII grossly intact. Moving all extremities.  Chest x-ray shows cardiomegaly, low lung volumes, elevation of the left hemidiaphragm.  MRI of the head shows 2 right lateral ventricle lesions consistent with small hemorrhages, mild atherosclerosis.  EKG shows normal sinus rhythm, 82 beats per minute, nonspecific ST-T wave changes.  LABORATORY DATA:  Significant for hemoglobin of 13, WBC 6, BUN and creatinine of 9 and 0.8, and potassium of 4.2.  TSH 1.02.  Hemoglobin A1c of six.  CK-MB negative x2.  Troponin negative x2.  LDL 60, HDL 36, and total cholesterol 108.  IMPRESSION:  The patient is an 77 year old with a history of coronary artery disease, cerebrovascular disease, and pulmonary venoocclusive Disease, admitted with syncope, confused after waking up.  On admission, he did have some variation in his heart rate and blood pressure though not diagnostic for orthostasis.  Neuro has followed the patient, do not feel that he had a CVA.  RECOMMENDATIONS: 1. Recheck orthostatics. 2. Get echocardiogram to reevaluate LV EF. 3. Continue telemetry.  No evidence of arrhythmia.  I am not convinced of any ischemia.  If EF is unchanged, I may not pursue further.  We would  recommend the patient get up out of bed with assistance as he has been in the bed since admission.     Pricilla Riffle, MD, [facility]     PVR/MEDQ  D:  06/29/2010  T:  06/29/2010  Job:  [id number]  Electronically Signed by Dietrich Pates MD [facility] on 07/26/2010 12:55:25 PM

## 2010-07-31 ENCOUNTER — Encounter: Payer: Self-pay | Admitting: Family Medicine

## 2010-08-03 ENCOUNTER — Encounter: Payer: Self-pay | Admitting: Family Medicine

## 2010-08-29 ENCOUNTER — Encounter (INDEPENDENT_AMBULATORY_CARE_PROVIDER_SITE_OTHER): Payer: Medicare Other

## 2010-08-29 DIAGNOSIS — I739 Peripheral vascular disease, unspecified: Secondary | ICD-10-CM

## 2010-08-29 DIAGNOSIS — Z48812 Encounter for surgical aftercare following surgery on the circulatory system: Secondary | ICD-10-CM

## 2010-08-31 ENCOUNTER — Ambulatory Visit: Payer: Medicare Other | Admitting: Cardiology

## 2010-09-02 ENCOUNTER — Encounter: Payer: Self-pay | Admitting: Family Medicine

## 2010-09-07 NOTE — Procedures (Unsigned)
BYPASS GRAFT EVALUATION  INDICATION:  Follow up right popliteal bypass graft.  HISTORY: Diabetes:  No. Cardiac:  No. Hypertension:  No. Smoking:  Previous. Previous Surgery:  Right popliteal graft, October 2010.  SINGLE LEVEL ARTERIAL EXAM                              RIGHT              LEFT Brachial:                    129                136 Anterior tibial:             157                169 Posterior tibial:            164                163 Peroneal: Ankle/brachial index:        1.21               1.24  PREVIOUS ABI:  Date: 02/08/2010  RIGHT:  1.14  LEFT:  1.16  LOWER EXTREMITY BYPASS GRAFT DUPLEX EXAM:  DUPLEX: 1. Widely patent right above-knee to below-knee popliteal graft with     biphasic waveforms which demonstrate retrograde diastole throughout     the graft. 2. There is a large possibly ruptured cystic structure in the     popliteal space measuring approximately 8.7 cm X 2.11 cm. 3. The popliteal aneurysm appears thrombosed with minimal retrograde     flow.  IMPRESSION: 1. Widely patent right above-knee to below-knee popliteal graft, as     described above. 2. Large Baker's cyst measuring 8.7 cm X 2.11 cm.  ___________________________________________ Janetta Hora. Fields, MD  LT/MEDQ  D:  08/29/2010  T:  08/29/2010  Job:  [id number]

## 2010-10-03 ENCOUNTER — Encounter: Payer: Self-pay | Admitting: Family Medicine

## 2010-10-05 ENCOUNTER — Ambulatory Visit: Payer: Medicare Other | Admitting: Cardiology

## 2010-10-15 ENCOUNTER — Encounter: Payer: Self-pay | Admitting: Cardiology

## 2010-10-16 ENCOUNTER — Encounter: Payer: Self-pay | Admitting: Cardiology

## 2010-10-16 ENCOUNTER — Ambulatory Visit (INDEPENDENT_AMBULATORY_CARE_PROVIDER_SITE_OTHER): Payer: Medicare Other | Admitting: Cardiology

## 2010-10-16 VITALS — BP 144/79 | HR 81 | Ht 71.0 in | Wt 211.0 lb

## 2010-10-16 DIAGNOSIS — R609 Edema, unspecified: Secondary | ICD-10-CM | POA: Insufficient documentation

## 2010-10-16 DIAGNOSIS — R55 Syncope and collapse: Secondary | ICD-10-CM

## 2010-10-16 DIAGNOSIS — I629 Nontraumatic intracranial hemorrhage, unspecified: Secondary | ICD-10-CM

## 2010-10-16 DIAGNOSIS — I498 Other specified cardiac arrhythmias: Secondary | ICD-10-CM

## 2010-10-16 DIAGNOSIS — R001 Bradycardia, unspecified: Secondary | ICD-10-CM

## 2010-10-16 NOTE — Assessment & Plan Note (Signed)
The patient will remain off aspirin even though was noted that I could restart it if I wanted to

## 2010-10-16 NOTE — Progress Notes (Signed)
HPI Patient is seen for follow up of syncope.  When I saw him last he decided to hold his beta blocker.  At last visit was in May, 2012.  He's done well since then.  I also decided to hold his aspirin because of 2 very small bleeds in the head.  He has had some mild increased edema.  There is no shortness of breath.  There is no PND or orthopnea. Allergies  Allergen Reactions  . Penicillins   . Sulfonamide Derivatives     Current Outpatient Prescriptions  Medication Sig Dispense Refill  . Calcium Carbonate (CALCIUM 500 PO) 2 tabs po qd       . Multiple Vitamin (MULTIVITAMIN) capsule Take 1 capsule by mouth daily.        . simvastatin (ZOCOR) 10 MG tablet Take 10 mg by mouth at bedtime.          History   Social History  . Marital Status: Married    Spouse Name: N/A    Number of Children: N/A  . Years of Education: N/A   Occupational History  . Not on file.   Social History Main Topics  . Smoking status: Former Games developer  . Smokeless tobacco: Not on file   Comment: quit appox 50 yrs ago  . Alcohol Use: No  . Drug Use: No  . Sexually Active: Not on file   Other Topics Concern  . Not on file   Social History Narrative  . No narrative on file    Family History  Problem Relation Age of Onset  . Diabetes Sister   . Stroke Mother   . Diabetes Mother     Past Medical History  Diagnosis Date  . CAD (coronary artery disease)     nuclear 01/2008, no ischemia, EF 60%, distal anterior scar  . Hyperlipidemia   . Syncope     .remote..no etiology  /  syncope 11/2008.. from acute leg pain  . Venous insufficiency     support hose  . Carotid artery disease     doppler 05/2009,  0-39% bilateral / doppler 06/2010 OK  . Elevated CPK     11/23/08.Marland KitchenMarland Kitchenprobably from debris from popliteal aneurysm  . Popliteal aneurysm     .repair..11/25/2008  Dr. Darrick Penna  . Speech problem     Neurologic event with brief infusion and speech difficulty.... January, 2011.... being assessed by neurology   February, 2011  . Hx of CABG     1998  . Ejection fraction     EF 45%, echo,05/2009,apical hypo,  ?? posterior and lateral hypo  . Prominent abdominal aortic pulsation     doppler normal  . Intracranial hemorrhage     Two small subependymal bleeds 06/2010,,ASA held  . Bradycardia     06/2010  . Edema     Ankle edema August, 2012    Past Surgical History  Procedure Date  . Coronary artery bypass graft 1998  . Popliteal bypass  with reversed greater saphenous vein from right leg.     ROS  Patient denies fever, chills, headache, sweats, rash, change in vision, change in hearing, chest pain, cough, nausea vomiting, urinary symptoms.  All of the systems are reviewed and are negative.  PHYSICAL EXAM Patient is oriented to person time and place affect is normal.  He is here with his wife and his daughter.  He really looks quite good.  Head is atraumatic.  There is no jugular venous distention.  Lungs are clear.  Respiratory  effort is not labored.  Cardiac exam reveals S1 and S2.  No clicks or significant murmurs.  Abdomen is soft.  He does have 1+ edema in both ankles. Filed Vitals:   10/16/10 1628  BP: 144/79  Pulse: 81  Height: 5\' 11"  (1.803 m)  Weight: 211 lb (95.709 kg)    EKG Is done today and reviewed by me.  There is normal sinus rhythm.  ASSESSMENT & PLAN

## 2010-10-16 NOTE — Assessment & Plan Note (Signed)
The patient does have some mild edema.  His wife is worried about this.  His daughter however remembers that this is only mild for him area he does have venous insufficiency.  His daughter and I are both worried about his syncope in the past.  I had considered giving him just a few days of low dose Lasix but decided not to treat at all.  He is to cut back on his total salt intake mildly.

## 2010-10-16 NOTE — Patient Instructions (Signed)
Your physician recommends that you schedule a follow-up appointment in: 8 weeks  

## 2010-10-16 NOTE — Assessment & Plan Note (Signed)
Heart rate today is stable.  He will remain off beta blocker.

## 2010-10-16 NOTE — Assessment & Plan Note (Signed)
There has been no recurrent syncope.  He is stable.  However he did have a small amount of bleeding in his head.  I have chosen to not restart aspirin.

## 2010-11-22 ENCOUNTER — Emergency Department: Payer: Self-pay | Admitting: Emergency Medicine

## 2010-11-23 ENCOUNTER — Telehealth: Payer: Self-pay | Admitting: Cardiology

## 2010-11-23 NOTE — Telephone Encounter (Signed)
9/21--pt's daughter calling stating [name] [name] was admitted to [facility] for TIA--he is home now, but daughter wanted dr Myrtis Ser to be made aware as pt is now on ASA 81mg --in past, due to bleed in brain, dr Myrtis Ser had taken [name] [name] off all anticoags and daughter wants to be sure dr Myrtis Ser is aware--pt also had some swelling in one of his feet/legs that dr Myrtis Ser noticed in past and they did u/s at [facility] that was normal--pt has appoint with dr Myrtis Ser soon and daughter will discuus all these events with him at that time--advised i would notify dr Myrtis Ser of all above--daughter agrees---nt

## 2010-11-23 NOTE — Telephone Encounter (Signed)
Pt's daughter called admitted to [facility] for tia and was just released everything was clear but put him back on aspirin 81 mg please call back

## 2010-11-29 NOTE — Telephone Encounter (Signed)
noted 

## 2010-12-05 ENCOUNTER — Telehealth: Payer: Self-pay | Admitting: Cardiology

## 2010-12-05 NOTE — Telephone Encounter (Signed)
We have not received records from [facility] yet. Kim in medical records will fax request for records today. Daughter, [name] will call back later this week to confirm we have received records. She is aware pt appt is 12/11/10 at 4pm Mylo Red RN

## 2010-12-05 NOTE — Telephone Encounter (Signed)
[name] [name] calling to see if records were received from [facility]. If not please contact [facility] to get records.    Please return pt daughter call to inform records have been received.

## 2010-12-05 NOTE — Telephone Encounter (Addendum)
ROI was faxed to [facility], records received gave to [facility] for Myrtis Ser appt 12/11/10/km  Spoke with [name] ( Daughter) ket her know we received Records   12/06/10/km

## 2010-12-11 ENCOUNTER — Encounter: Payer: Self-pay | Admitting: Cardiology

## 2010-12-11 ENCOUNTER — Encounter: Payer: Self-pay | Admitting: *Deleted

## 2010-12-11 ENCOUNTER — Ambulatory Visit (INDEPENDENT_AMBULATORY_CARE_PROVIDER_SITE_OTHER): Payer: Medicare Other | Admitting: Cardiology

## 2010-12-11 DIAGNOSIS — R55 Syncope and collapse: Secondary | ICD-10-CM

## 2010-12-11 DIAGNOSIS — I2510 Atherosclerotic heart disease of native coronary artery without angina pectoris: Secondary | ICD-10-CM

## 2010-12-11 DIAGNOSIS — G459 Transient cerebral ischemic attack, unspecified: Secondary | ICD-10-CM

## 2010-12-11 NOTE — Patient Instructions (Addendum)
Your physician wants you to follow-up in: 6 MONTHS WITH DR KATZ  You will receive a reminder letter in the mail two months in advance. If you don't receive a letter, please call our office to schedule the follow-up appointment. Your physician recommends that you continue on your current medications as directed. Please refer to the Current Medication list given to you today. 

## 2010-12-11 NOTE — Assessment & Plan Note (Signed)
After extensive review I have outlined the history in the history of present illness.  I believe that the patient had a syncopal episode in April.  With that episode he had very slight CNS bleeding.  He was seen by neurology and was told that eventually his aspirin could be restored.  It had not been restarted and he then had a different event that is probably a recurrent TIA.

## 2010-12-11 NOTE — Assessment & Plan Note (Addendum)
It appears that the patient had a TIA in January, 2012.  He was on aspirin but then had a syncopal episode and his aspirin was held her very slight CNS bleeding.  While off aspirin he had another TIA.  His aspirin has been resumed.  We know that with his recent TIA there was no evidence of CNS bleeding.  We also know that his carotid Dopplers in April, 2012 revealed no significant abnormalities.  At this point it is most prudent to continue his aspirin.  I talked with the family about hospital continued neurology evaluation.  Patient is elderly and they were hesitant to have to see more physicians.  I will try to discuss the case with neurology to see if aspirin is the best choice for him at this point.  He does not need Coumadin.   The patient had an MRI/MRA June 27, 2010.  I will look into having this reviewed to see if I can get more device about the choice of therapy.

## 2010-12-11 NOTE — Assessment & Plan Note (Signed)
Coronary disease is stable.  No further workup. 

## 2010-12-11 NOTE — Progress Notes (Signed)
HPI The patient is seen to followup episodes of syncope in the past And also recent TIAs.  Over many years the patient has had a few episodes of syncope.  Definite etiology was never found but we suspected orthostasis.  He had an episode of syncope in September, 2010 when he had acute leg pain.  In January, 2012 his symptoms were different.  He had very brief confusion and speech difficulties most consistent with a TIA.  He was continued on aspirin at that time.  In April, 2012 he had a syncopal episode.  Etiology was not completely clear.  He was seen by neurology.  Carotid Dopplers showed no significant carotid disease.  A CT scan had shown to small ependymal bleeds.  Neurology suggested that the patient asked to be held for a period of time and that it could be eventually restarted.  Before the aspirin could be restarted the patient had recurrent symptoms similar to January, 2012.  He had a brief episode of confusion and decreased speech.  He was assessed in [place].  A head CT revealed no acute bleed.  Small areas of bleeding from the past were not seen.  It was felt that this was a TIA and he was put back on his aspirin.  He is now here for cardiology followup.  He has not had any recurrence.  He is fully alert.  He is here with his family.  He is not having any chest pain, shortness of breath, syncope or presyncope.   Allergies  Allergen Reactions  . Penicillins   . Sulfonamide Derivatives     Current Outpatient Prescriptions  Medication Sig Dispense Refill  . aspirin 81 MG tablet Take 81 mg by mouth daily.        . Calcium Carbonate (CALCIUM 500 PO) 2 tabs po qd       . Multiple Vitamin (MULTIVITAMIN) capsule Take 1 capsule by mouth daily.        . simvastatin (ZOCOR) 10 MG tablet Take 10 mg by mouth at bedtime.          History   Social History  . Marital Status: Married    Spouse Name: N/A    Number of Children: N/A  . Years of Education: N/A   Occupational History  . Not on  file.   Social History Main Topics  . Smoking status: Former Games developer  . Smokeless tobacco: Not on file   Comment: quit appox 50 yrs ago  . Alcohol Use: No  . Drug Use: No  . Sexually Active: Not on file   Other Topics Concern  . Not on file   Social History Narrative  . No narrative on file    Family History  Problem Relation Age of Onset  . Diabetes Sister   . Stroke Mother   . Diabetes Mother     Past Medical History  Diagnosis Date  . CAD (coronary artery disease)     nuclear 01/2008, no ischemia, EF 60%, distal anterior scar  . Hyperlipidemia   . Syncope     .remote..no etiology  /  syncope 11/2008.. from acute leg pain  . Venous insufficiency     support hose  . Carotid artery disease     doppler 05/2009,  0-39% bilateral / doppler 06/2010 OK  . Elevated CPK     11/23/08.Marland KitchenMarland Kitchenprobably from debris from popliteal aneurysm  . Popliteal aneurysm     .repair..11/25/2008  Dr. Darrick Penna  . Speech problem  Neurologic event with brief infusion and speech difficulty.... January, 2011.... being assessed by neurology  February, 2011  . Hx of CABG     1998  . Ejection fraction     EF 45%, echo,05/2009,apical hypo,  ?? posterior and lateral hypo  . Prominent abdominal aortic pulsation     doppler normal  . Intracranial hemorrhage     Two small subependymal bleeds 06/2010,,ASA held  . Bradycardia     06/2010  . Edema     Ankle edema August, 2012  . TIA (transient ischemic attack)     Brief episode of confusion and speech difficulty, January, 2012  /  recurrent confusion speech difficulty September, 2012    Past Surgical History  Procedure Date  . Coronary artery bypass graft 1998  . Popliteal bypass  with reversed greater saphenous vein from right leg.     ROS  Patient denies fever, chills, headache, sweats, rash, change in vision, hearing, chest pain, cough, nausea vomiting, urinary symptoms.  All other systems are reviewed and are negative  PHYSICAL EXAM The patient is  stable today.  He is here with his wife and daughter.  He is oriented to person time and place.  Affect is normal.  His speech is normal.  Head is atraumatic.  There is no jugular venous distention.  There no carotid bruits.  Lungs are clear.  Respiratory effort is nonlabored.  Cardiac exam reveals S1 without S2.  There are no clicks or significant murmurs.  The abdomen is soft.  There is no peripheral edema.  There are no musculoskeletal deformities.  He does walk with a cane.  There no skin rashes. Filed Vitals:   12/11/10 1608  BP: 159/91  Pulse: 74  Height: 5\' 11"  (1.803 m)  Weight: 213 lb 12.8 oz (96.979 kg)    EKG is not done today. ASSESSMENT & PLAN

## 2010-12-13 ENCOUNTER — Encounter: Payer: Self-pay | Admitting: Cardiology

## 2010-12-13 NOTE — Progress Notes (Signed)
I have reviewed the progress notes during the patient's hospitalization in April, 2012.  He was seen by neurology during that hospitalization.  The neurologist saw both the head CT scan and the MRI.  There is no mention in the MRI report of any type of amyloid angiopathy.  The neurologist felt that the patient's aspirin could be restarted 2 weeks later.  With all of this information reviewed I am sure now that it continues to be most appropriate for the patient to remain on aspirin.  See my note of December 11, 2010.  I will call his daughter [name] [phone]  [phone] to be sure that she is aware of the plan.  This is the daughter that lives where the patient lives.

## 2010-12-18 ENCOUNTER — Encounter: Payer: Self-pay | Admitting: Cardiology

## 2010-12-18 NOTE — Telephone Encounter (Signed)
Pt daughter returning call to [place]. Pt has to have tooth extracted and dentist wanted pt to check about whether pt stop taking aspirin or not. Is it safe for pt to get off aspirin after pt TIA. If pt needs to stop taking aspirin, how long before tooth extraction should he stop taking aspirin and how long should he not take it after the procedure.  Please call back.

## 2010-12-18 NOTE — Telephone Encounter (Signed)
Pt daughter calling regarding pt needing to have tooth pulled and wanting to know if pt needs to stop taking aspirin and if so for long and how soon before tooth is pulled. Please return call to discuss further.   Pt had TIA recently.   Please call other sister back Denise-[phone]

## 2010-12-18 NOTE — Telephone Encounter (Signed)
N/A.  LMTC. 

## 2010-12-20 NOTE — Telephone Encounter (Signed)
error 

## 2010-12-20 NOTE — Telephone Encounter (Signed)
Pt has infection under one tooth and needs an extraction by Dr Modesto Charon.  Maybe more teeth at a later date, but one for now.  Date for extraction has not yet been set.  Can he d/c asa?  If so, for how many days before and after extraction?

## 2010-12-26 ENCOUNTER — Telehealth: Payer: Self-pay | Admitting: Cardiology

## 2010-12-26 NOTE — Telephone Encounter (Signed)
Spoke with daughter regarding call from earlier in the month.  Daughter wanted to know what Dr Myrtis Ser thinks about patient coming off his ASA prior to tooth extraction.  Dentist wanted to know what Dr Myrtis Ser recommended.  Discussed with Dr Myrtis Ser and he would prefer patient to continue ASA without stopping however, if dentist extremely concerned about bleeding it is ok to stop 3 days prior. Advised daughter

## 2010-12-26 NOTE — Telephone Encounter (Signed)
Pt's daughter is calling. He is to have a tooth extracted.  Please call back about what he is to do.

## 2011-02-12 ENCOUNTER — Observation Stay: Payer: Self-pay | Admitting: *Deleted

## 2011-02-22 ENCOUNTER — Ambulatory Visit: Payer: Medicare Other | Admitting: Physician Assistant

## 2011-02-28 ENCOUNTER — Telehealth: Payer: Self-pay | Admitting: Physician Assistant

## 2011-02-28 ENCOUNTER — Encounter: Payer: Self-pay | Admitting: Physician Assistant

## 2011-02-28 ENCOUNTER — Ambulatory Visit (INDEPENDENT_AMBULATORY_CARE_PROVIDER_SITE_OTHER): Payer: Medicare Other | Admitting: Physician Assistant

## 2011-02-28 VITALS — BP 124/68 | Ht 71.0 in | Wt 214.0 lb

## 2011-02-28 DIAGNOSIS — E785 Hyperlipidemia, unspecified: Secondary | ICD-10-CM

## 2011-02-28 DIAGNOSIS — I2510 Atherosclerotic heart disease of native coronary artery without angina pectoris: Secondary | ICD-10-CM

## 2011-02-28 DIAGNOSIS — R55 Syncope and collapse: Secondary | ICD-10-CM

## 2011-02-28 NOTE — Assessment & Plan Note (Signed)
Managed by PCP

## 2011-02-28 NOTE — Progress Notes (Signed)
[address] [place], [place]  [address] Phone: [phone] Fax:  [phone]  Date:  02/28/2011   Name:  [name] [name]       DOB:  [birth year] MRN:  [id number]  PCP:  Dr. Dossie Arbour Primary Cardiologist:  Dr. Zackery Barefoot  Primary Electrophysiologist:  None    History of Present Illness: [name] [name] is a 77 y.o. male who presents for post hospital follow up [facility]).  He has a h/o syncope in the past and also recent TIAs. Over many years the patient has had a few episodes of syncope. Definite etiology was never found but it was suspected to be from orthostasis. He had an episode of syncope in September, 2010 when he had acute leg pain. In January, 2012 his symptoms were different. He had very brief confusion and speech difficulties most consistent with a TIA. He was continued on aspirin at that time. In April, 2012 he had a syncopal episode. Etiology was not completely clear. He was seen by neurology. Carotid Dopplers showed no significant carotid disease. A CT scan had shown to small ependymal bleeds. Neurology suggested that the patient's ASA be held for a period of time and that it could be eventually restarted. Before the aspirin could be restarted the patient had recurrent symptoms similar to January, 2012. He had a brief episode of confusion and decreased speech. He was assessed in [place]. A head CT revealed no acute bleed. Small areas of bleeding from the past were not seen. It was felt that this was a TIA and he was put back on his aspirin. He was last seen by Dr. Myrtis Ser 10/9.  He reviewed the patient's case with neurology after that visit.  Neurology had seen the patient during his hospitalization 4/12 and reviewed his CT scan and MRI.  The neurologist didn't feel that the aspirin could be restarted 2 weeks later.  He was admitted overnight to [facility] about 2 weeks ago.  He had fallen at his home.  He does not remember  much about the fall.  He denies any prodrome.  He denies chest pain, significant dyspnea, orthopnea, PND.  He has chronic ankle edema without change.  He denies recent palpitations.  He denies any symptoms of near syncope.  This episode was similar to previous episodes in the past where he has fallen.  His workup at the hospital was essentially unremarkable aside from a low hemoglobin.  This was apparently normal by labs done with his PCP last week.  He did have some elevated heart rates and oxygen desaturation with ambulation initially.  However, on follow up with the home health nurse, this had resolved.  We do not have the records from the hospital and these have been requested.  Past Medical History  Diagnosis Date  . CAD (coronary artery disease)     nuclear 01/2008, no ischemia, EF 60%, distal anterior scar  . Hyperlipidemia   . Syncope     .remote..no etiology  /  syncope 11/2008.. from acute leg pain  . Venous insufficiency     support hose  . Carotid artery disease     doppler 05/2009,  0-39% bilateral / doppler 06/2010 OK  . Elevated CPK     11/23/08.Marland KitchenMarland Kitchenprobably from debris from popliteal aneurysm  . Popliteal aneurysm     .repair..11/25/2008  Dr. Darrick Penna  . Speech problem     Neurologic event with brief infusion and speech difficulty.... January, 2011.... being assessed  by neurology  February, 2011  . Hx of CABG     1998  . Ejection fraction     EF 45%, echo,05/2009,apical hypo,  ?? posterior and lateral hypo  . Prominent abdominal aortic pulsation     doppler normal  . Intracranial hemorrhage     Two small subependymal bleeds 06/2010,,ASA held  . Bradycardia     06/2010  . Edema     Ankle edema August, 2012  . TIA (transient ischemic attack)     Brief episode of confusion and speech difficulty, January, 2012  /  recurrent confusion speech difficulty September, 2012    Current Outpatient Prescriptions  Medication Sig Dispense Refill  . aspirin 81 MG tablet Take 81 mg by mouth  daily.        . Calcium Carbonate (CALCIUM 500 PO) 2 tabs po qd       . Multiple Vitamin (MULTIVITAMIN) capsule Take 1 capsule by mouth daily.        . simvastatin (ZOCOR) 10 MG tablet Take 10 mg by mouth at bedtime.          Allergies: Allergies  Allergen Reactions  . Penicillins   . Sulfonamide Derivatives     History  Substance Use Topics  . Smoking status: Former Games developer  . Smokeless tobacco: Not on file   Comment: quit appox 50 yrs ago  . Alcohol Use: No     ROS:  Please see the history of present illness.   Denies slurred speech, facial droop or unilateral weakness.  All other systems reviewed and negative.   PHYSICAL EXAM: VS:  BP 124/68  Ht 5\' 11"  (1.803 m)  Wt 214 lb (97.07 kg)  BMI 29.85 kg/m2 Well nourished, well developed, in no acute distress HEENT: normal Neck: no JVD Cardiac:  normal S1, S2; RRR; no murmur Lungs:  clear to auscultation bilaterally, no wheezing, rhonchi or rales Abd: soft, nontender, no hepatomegaly Ext: trace ankle edema Skin: warm and dry Neuro:  CNs 2-12 intact, no focal abnormalities noted; ambulates slowly with a walker  EKG:   Sinus rhythm, heart rate 86, normal axis, nonspecific ST-T wave changes, no significant change when compared to prior tracings  ASSESSMENT AND PLAN:

## 2011-02-28 NOTE — Assessment & Plan Note (Signed)
Stable.  No angina.  Continue aspirin and statin. 

## 2011-02-28 NOTE — Patient Instructions (Signed)
Your physician recommends that you schedule a follow-up appointment in: 3 MONTHS with Dr. Myrtis Ser.  Your physician recommends that you continue on your current medications as directed. Please refer to the Current Medication list given to you today.

## 2011-02-28 NOTE — Telephone Encounter (Signed)
ROI faxed to [facility] to [facility] records, Records received  Gave to [employer], 02/28/11/km

## 2011-02-28 NOTE — Assessment & Plan Note (Addendum)
It is unclear his episode recently was related to syncope or simply a fall.  As noted his records have been requested.  I will review those when they arrive to decide if he needs any further cardiovascular testing.  At this time continue current therapy.  He will follow up with Dr. Myrtis Ser in 3 months.  I received his records from [facility].  His ECG and tele demonstrated NSR.  CE's were negative.  Creatinine was normal.  Hgb and Plt counts were mildly low but improved at d/c.  Head CT was unremarkable.  Admission notes indicate he became dizzy with going up steps at church and fell.  Given this, I will continue with follow up as planned in 3 mos.

## 2011-05-20 ENCOUNTER — Encounter: Payer: Self-pay | Admitting: Internal Medicine

## 2011-05-20 ENCOUNTER — Ambulatory Visit (INDEPENDENT_AMBULATORY_CARE_PROVIDER_SITE_OTHER): Payer: Medicare Other | Admitting: Internal Medicine

## 2011-05-20 VITALS — BP 136/77 | HR 77 | Resp 18 | Ht 71.0 in | Wt 215.0 lb

## 2011-05-20 DIAGNOSIS — I2510 Atherosclerotic heart disease of native coronary artery without angina pectoris: Secondary | ICD-10-CM

## 2011-05-20 DIAGNOSIS — R609 Edema, unspecified: Secondary | ICD-10-CM

## 2011-05-20 DIAGNOSIS — E785 Hyperlipidemia, unspecified: Secondary | ICD-10-CM

## 2011-05-20 NOTE — Patient Instructions (Signed)
Your physician wants you to follow-up in: 6 months with Dr Chancy Hurter will receive a reminder letter in the mail two months in advance. If you don't receive a letter, please call our office to schedule the follow-up appointment.

## 2011-05-20 NOTE — Assessment & Plan Note (Signed)
Stable No change required today  

## 2011-05-20 NOTE — Assessment & Plan Note (Signed)
Stable Salt restriction advised 

## 2011-05-20 NOTE — Progress Notes (Signed)
PCP:  [name], MD, MD Primary Cardiologist:  Dr Myrtis Ser  The patient presents today for routine cardiology followup.  I am seeing him today as Dr Myrtis Ser is out with recent surgery. Since last being seen by Tereso Newcomer, the patient reports doing very well.  Today, he states "I feel great".   Today, he denies symptoms of palpitations, chest pain, shortness of breath, orthopnea, PND,dizziness, presyncope, syncope, or further falls.  His edema is stable.  The patient feels that he is tolerating medications without difficulties and is otherwise without complaint today.   Past Medical History  Diagnosis Date  . CAD (coronary artery disease)     nuclear 01/2008, no ischemia, EF 60%, distal anterior scar  . Hyperlipidemia   . Syncope     .remote..no etiology  /  syncope 11/2008.. from acute leg pain  . Venous insufficiency     support hose  . Carotid artery disease     doppler 05/2009,  0-39% bilateral / doppler 06/2010 OK  . Elevated CPK     11/23/08.Marland KitchenMarland Kitchenprobably from debris from popliteal aneurysm  . Popliteal aneurysm     .repair..11/25/2008  Dr. Darrick Penna  . Speech problem     Neurologic event with brief infusion and speech difficulty.... January, 2011.... being assessed by neurology  February, 2011  . Hx of CABG     1998  . Ejection fraction     EF 45%, echo,05/2009,apical hypo,  ?? posterior and lateral hypo  . Prominent abdominal aortic pulsation     doppler normal  . Intracranial hemorrhage     Two small subependymal bleeds 06/2010,,ASA held  . Bradycardia     06/2010  . Edema     Ankle edema August, 2012  . TIA (transient ischemic attack)     Brief episode of confusion and speech difficulty, January, 2012  /  recurrent confusion speech difficulty September, 2012   Past Surgical History  Procedure Date  . Coronary artery bypass graft 1998  . Popliteal bypass  with reversed greater saphenous vein from right leg.     Current Outpatient Prescriptions  Medication Sig Dispense Refill  .  aspirin 81 MG tablet Take 81 mg by mouth daily.        . Calcium Carbonate (CALCIUM 500 PO) 2 tabs po qd       . Multiple Vitamin (MULTIVITAMIN) capsule Take 1 capsule by mouth daily.        . simvastatin (ZOCOR) 10 MG tablet Take 10 mg by mouth at bedtime.          Allergies  Allergen Reactions  . Penicillins   . Sulfonamide Derivatives     History   Social History  . Marital Status: Married    Spouse Name: N/A    Number of Children: N/A  . Years of Education: N/A   Occupational History  . Not on file.   Social History Main Topics  . Smoking status: Former Games developer  . Smokeless tobacco: Not on file   Comment: quit appox 50 yrs ago  . Alcohol Use: No  . Drug Use: No  . Sexually Active: Not on file   Other Topics Concern  . Not on file   Social History Narrative  . No narrative on file    Family History  Problem Relation Age of Onset  . Diabetes Sister   . Stroke Mother   . Diabetes Mother     Physical Exam: Filed Vitals:   05/20/11 1628  BP: 136/77  Pulse: 77  Resp: 18  Height: 5\' 11"  (1.803 m)  Weight: 215 lb (97.523 kg)    GEN- The patient is well appearing, alert and oriented x 3 today.   Head- normocephalic, atraumatic Eyes-  Sclera clear, conjunctiva pink Ears- hearing intact Oropharynx- clear Lungs- Clear to ausculation bilaterally, normal work of breathing Heart- Regular rate and rhythm, no murmurs, rubs or gallops, PMI not laterally displaced GI- soft, NT, ND, + BS Extremities- no clubbing, cyanosis, 1+ edema   Assessment and Plan:

## 2011-05-20 NOTE — Assessment & Plan Note (Signed)
No ischemic symptoms No changes today 

## 2011-09-11 ENCOUNTER — Encounter: Payer: Self-pay | Admitting: Neurosurgery

## 2011-09-12 ENCOUNTER — Ambulatory Visit (INDEPENDENT_AMBULATORY_CARE_PROVIDER_SITE_OTHER): Payer: Medicare Other | Admitting: *Deleted

## 2011-09-12 ENCOUNTER — Encounter: Payer: Self-pay | Admitting: Neurosurgery

## 2011-09-12 ENCOUNTER — Encounter (INDEPENDENT_AMBULATORY_CARE_PROVIDER_SITE_OTHER): Payer: Medicare Other | Admitting: *Deleted

## 2011-09-12 ENCOUNTER — Ambulatory Visit (INDEPENDENT_AMBULATORY_CARE_PROVIDER_SITE_OTHER): Payer: Medicare Other | Admitting: Neurosurgery

## 2011-09-12 VITALS — BP 137/74 | HR 78 | Resp 18 | Ht 71.5 in | Wt 214.0 lb

## 2011-09-12 DIAGNOSIS — Z48812 Encounter for surgical aftercare following surgery on the circulatory system: Secondary | ICD-10-CM

## 2011-09-12 DIAGNOSIS — I724 Aneurysm of artery of lower extremity: Secondary | ICD-10-CM

## 2011-09-12 DIAGNOSIS — I70309 Unspecified atherosclerosis of unspecified type of bypass graft(s) of the extremities, unspecified extremity: Secondary | ICD-10-CM

## 2011-09-12 NOTE — Progress Notes (Signed)
[facility] Office Note  CC: Annual ABIs with lower extremity bypass graft evaluation Referring Physician: Fields  History of Present Illness: 78 year old male patient of Dr. Darrick Penna who status post a right above-the-knee to below the knee popliteal graft placed October 2010. The patient denies any true claudication, he has no rest pain and denies any open ulcerations. The patient denies any new medical diagnoses or recent surgeries.  Past Medical History  Diagnosis Date  . CAD (coronary artery disease)     nuclear 01/2008, no ischemia, EF 60%, distal anterior scar  . Hyperlipidemia   . Syncope     .remote..no etiology  /  syncope 11/2008.. from acute leg pain  . Venous insufficiency     support hose  . Carotid artery disease     doppler 05/2009,  0-39% bilateral / doppler 06/2010 OK  . Elevated CPK     11/23/08.Marland KitchenMarland Kitchenprobably from debris from popliteal aneurysm  . Popliteal aneurysm     .repair..11/25/2008  Dr. Darrick Penna  . Speech problem     Neurologic event with brief infusion and speech difficulty.... January, 2011.... being assessed by neurology  February, 2011  . Hx of CABG     1998  . Ejection fraction     EF 45%, echo,05/2009,apical hypo,  ?? posterior and lateral hypo  . Prominent abdominal aortic pulsation     doppler normal  . Intracranial hemorrhage     Two small subependymal bleeds 06/2010,,ASA held  . Bradycardia     06/2010  . Edema     Ankle edema August, 2012  . TIA (transient ischemic attack)     Brief episode of confusion and speech difficulty, January, 2012  /  recurrent confusion speech difficulty September, 2012    ROS: [x]  Positive   [ ]  Denies    General: [ ]  Weight loss, [ ]  Fever, [ ]  chills Neurologic: [ ]  Dizziness, [ ]  Blackouts, [ ]  Seizure [ ]  Stroke, [ ]  "Mini stroke", [ ]  Slurred speech, [ ]  Temporary blindness; [ ]  weakness in arms or legs, [ ]  Hoarseness Cardiac: [ ]  Chest pain/pressure, [ ]  Shortness of breath at  rest [ ]  Shortness of breath with exertion, [ ]  Atrial fibrillation or irregular heartbeat Vascular: [ ]  Pain in legs with walking, [ ]  Pain in legs at rest, [ ]  Pain in legs at night,  [ ]  Non-healing ulcer, [ ]  Blood clot in vein/DVT,   Pulmonary: [ ]  Home oxygen, [ ]  Productive cough, [ ]  Coughing up blood, [ ]  Asthma,  [ ]  Wheezing Musculoskeletal:  [ ]  Arthritis, [ ]  Low back pain, [ ]  Joint pain Hematologic: [ ]  Easy Bruising, [ ]  Anemia; [ ]  Hepatitis Gastrointestinal: [ ]  Blood in stool, [ ]  Gastroesophageal Reflux/heartburn, [ ]  Trouble swallowing Urinary: [ ]  chronic Kidney disease, [ ]  on HD - [ ]  MWF or [ ]  TTHS, [ ]  Burning with urination, [ ]  Difficulty urinating Skin: [ ]  Rashes, [ ]  Wounds Psychological: [ ]  Anxiety, [ ]  Depression   Social History History  Substance Use Topics  . Smoking status: Former Smoker -- 20 years    Types: Cigarettes    Quit date: 09/12/1946  . Smokeless tobacco: Never Used   Comment: quit appox 50 yrs ago  . Alcohol Use: No    Family History Family History  Problem Relation Age of Onset  . Diabetes Sister   . Stroke Mother   . Diabetes Mother  Allergies  Allergen Reactions  . Penicillins   . Sulfonamide Derivatives     Current Outpatient Prescriptions  Medication Sig Dispense Refill  . aspirin 81 MG tablet Take 81 mg by mouth daily.        . Calcium Carbonate (CALCIUM 500 PO) 2 tabs po qd       . Multiple Vitamin (MULTIVITAMIN) capsule Take 1 capsule by mouth daily.        . simvastatin (ZOCOR) 10 MG tablet Take 10 mg by mouth at bedtime.          Physical Examination  Filed Vitals:   09/12/11 1409  BP: 137/74  Pulse: 78  Resp: 18    Body mass index is 29.43 kg/(m^2).  General:  WDWN in NAD Gait: Normal HEENT: WNL Eyes: Pupils equal Pulmonary: normal non-labored breathing , without Rales, rhonchi,  wheezing Cardiac: RRR, without  Murmurs, rubs or gallops; No carotid bruits Abdomen: soft, NT, no masses Skin:  no rashes, ulcers noted Vascular Exam/Pulses: Lower extremity pulses heard bilaterally with Doppler  Extremities without ischemic changes, no Gangrene , no cellulitis; no open wounds;  Musculoskeletal: no muscle wasting or atrophy  Neurologic: A&O X 3; Appropriate Affect ; SENSATION: normal; MOTOR FUNCTION:  moving all extremities equally. Speech is fluent/normal  Non-Invasive Vascular Imaging: ABIs today are 1.32 on the right and biphasic to triphasic, 1.35 and triphasic on the left, lower extremity duplex bypass graft shows a patent above the knee to below the knee popliteal graft  ASSESSMENT/PLAN: Asymptomatic patient doing well status post above the knee to below the knee popliteal graft 3 years ago. The patient return in one year for repeat ABIs and graft duplex. His questions were encouraged and answered, he is in agreement with this plan.  Lauree Chandler ANP  Clinic M.D.: Fields

## 2011-09-13 NOTE — Addendum Note (Signed)
Addended by: Sharee Pimple on: 09/13/2011 11:24 AM   Modules accepted: Orders

## 2011-09-20 NOTE — Procedures (Unsigned)
BYPASS GRAFT EVALUATION  INDICATION:  Followup right popliteal bypass graft.  HISTORY: Diabetes:  No Cardiac:  No Hypertension:  No Smoking:  Previously Previous Surgery:  Right above-knee-to-below-knee popliteal bypass graft, 12/06/2008, secondary to popliteal aneurysm  SINGLE LEVEL ARTERIAL EXAM                              RIGHT              LEFT Brachial: Anterior tibial: Posterior tibial: Peroneal: Ankle/brachial index:  PREVIOUS ABI:  Date: 08/29/2010  RIGHT:  1.21  LEFT:  1.29  LOWER EXTREMITY BYPASS GRAFT DUPLEX EXAM:  DUPLEX:  Biphasic waveforms throughout the right lower extremity and bypass graft.  IMPRESSION: 1. Patent right above-knee-to-below-knee popliteal bypass graft. 2. Fluid visualized in the right medial knee, approximately 8 cm in     length (6.60 x 2.32 cm transverse).  ___________________________________________ Janetta Hora Fields, MD  SS/MEDQ  D:  09/12/2011  T:  09/12/2011  Job:  [id number]

## 2012-01-22 ENCOUNTER — Encounter: Payer: Self-pay | Admitting: Cardiology

## 2012-01-22 DIAGNOSIS — I739 Peripheral vascular disease, unspecified: Secondary | ICD-10-CM | POA: Insufficient documentation

## 2012-01-23 ENCOUNTER — Encounter: Payer: Self-pay | Admitting: Cardiology

## 2012-01-23 ENCOUNTER — Ambulatory Visit (INDEPENDENT_AMBULATORY_CARE_PROVIDER_SITE_OTHER): Payer: Medicare Other | Admitting: Cardiology

## 2012-01-23 VITALS — BP 122/68 | HR 78 | Ht 71.5 in | Wt 209.0 lb

## 2012-01-23 DIAGNOSIS — I2510 Atherosclerotic heart disease of native coronary artery without angina pectoris: Secondary | ICD-10-CM

## 2012-01-23 DIAGNOSIS — I498 Other specified cardiac arrhythmias: Secondary | ICD-10-CM

## 2012-01-23 DIAGNOSIS — R55 Syncope and collapse: Secondary | ICD-10-CM

## 2012-01-23 DIAGNOSIS — I739 Peripheral vascular disease, unspecified: Secondary | ICD-10-CM

## 2012-01-23 DIAGNOSIS — R001 Bradycardia, unspecified: Secondary | ICD-10-CM

## 2012-01-23 NOTE — Progress Notes (Signed)
HPI  Patient is seen for cardiology followup. He has known coronary disease. He has had some neurologic events over time related to different issues. Most recently he was seen in the office by Mr. Alben Spittle in December, 2012. He was seen after a hospitalization in [place] he had a spell just before then. It was not clear that he had definite neurologic problem. He is done well since then. He has not had a recurrent episode. He is not having any significant chest pain or shortness of breath.  Allergies  Allergen Reactions  . Penicillins   . Sulfonamide Derivatives     Current Outpatient Prescriptions  Medication Sig Dispense Refill  . aspirin 81 MG tablet Take 81 mg by mouth daily.        . Calcium Carbonate (CALCIUM 500 PO) 2 tabs po qd       . Multiple Vitamin (MULTIVITAMIN) capsule Take 1 capsule by mouth daily.          History   Social History  . Marital Status: Married    Spouse Name: N/A    Number of Children: N/A  . Years of Education: N/A   Occupational History  . Not on file.   Social History Main Topics  . Smoking status: Former Smoker -- 20 years    Types: Cigarettes    Quit date: 09/12/1946  . Smokeless tobacco: Never Used     Comment: quit appox 50 yrs ago  . Alcohol Use: No  . Drug Use: No  . Sexually Active: Not on file   Other Topics Concern  . Not on file   Social History Narrative  . No narrative on file    Family History  Problem Relation Age of Onset  . Diabetes Sister   . Stroke Mother   . Diabetes Mother     Past Medical History  Diagnosis Date  . CAD (coronary artery disease)     nuclear 01/2008, no ischemia, EF 60%, distal anterior scar  . Hyperlipidemia   . Syncope     .remote..no etiology  /  syncope 11/2008.. from acute leg pain  . Venous insufficiency     support hose  . Carotid artery disease     doppler 05/2009,  0-39% bilateral / doppler 06/2010 OK  . Elevated CPK     11/23/08.Marland KitchenMarland Kitchenprobably from debris from popliteal aneurysm    . Popliteal aneurysm     .repair..11/25/2008  Dr. Darrick Penna  . Speech problem     Neurologic event with brief infusion and speech difficulty.... January, 2011.... being assessed by neurology  February, 2011  . Hx of CABG     1998  . Ejection fraction     EF 45%, echo,05/2009,apical hypo,  ?? posterior and lateral hypo  . Prominent abdominal aortic pulsation     doppler normal  . Intracranial hemorrhage     Two small subependymal bleeds 06/2010,,ASA held  . Bradycardia     06/2010  . Edema     Ankle edema August, 2012  . TIA (transient ischemic attack)     Brief episode of confusion and speech difficulty, January, 2012  /  recurrent confusion speech difficulty September, 2012  . PAD (peripheral artery disease)     Above the knee to below the knee popliteal bypass,, right  //    Doppler,  July, 2013, patent    Past Surgical History  Procedure Date  . Coronary artery bypass graft 1998  . Popliteal bypass  with reversed greater  saphenous vein from right leg.     Patient Active Problem List  Diagnosis  . Hyperlipidemia  . Syncope  . CAD (coronary artery disease)  . Venous insufficiency  . Carotid artery disease  . Elevated CPK  . Popliteal aneurysm  . Speech problem  . Hx of CABG  . Ejection fraction  . Prominent abdominal aortic pulsation  . Intracranial hemorrhage  . Bradycardia  . Edema  . TIA (transient ischemic attack)  . PAD (peripheral artery disease)    ROS   Patient denies fever, chills, headache, sweats, rash, change in vision, change in hearing, chest pain, cough, nausea vomiting, urinary symptoms. All other systems are reviewed and are negative.  PHYSICAL EXAM  Patient is here with his elderly wife and his daughter. He is oriented to person time and place. Affect is normal. There is no jugulovenous distention. Lungs are clear. Respiratory effort is nonlabored. Cardiac exam reveals S1 and S2. There are no clicks or significant murmurs. The abdomen is soft. There  is no peripheral edema.  Filed Vitals:   01/23/12 1637  BP: 122/68  Pulse: 78  Height: 5' 11.5" (1.816 m)  Weight: 209 lb (94.802 kg)   EKG is done today and reviewed by me. There is no significant change.  ASSESSMENT & PLAN

## 2012-01-23 NOTE — Patient Instructions (Addendum)
Your physician wants you to follow-up in: 1 year. You will receive a reminder letter in the mail two months in advance. If you don't receive a letter, please call our office to schedule the follow-up appointment.  

## 2012-01-23 NOTE — Assessment & Plan Note (Signed)
Over many years the patient has had syncope for different reasons. He has not had any type of episode since November, 2012. No further workup.

## 2012-01-23 NOTE — Assessment & Plan Note (Signed)
He is not having any significant bradycardia. No change in therapy. 

## 2012-01-23 NOTE — Assessment & Plan Note (Signed)
His peripheral disease is stable. He is being followed by vascular surgery. Overall he is doing well. No change in therapy. I'll see him back in one year.

## 2012-01-23 NOTE — Assessment & Plan Note (Signed)
Coronary disease is stable. No change in therapy. 

## 2012-01-31 NOTE — Addendum Note (Signed)
Addended by: Jarvis Newcomer on: 01/31/2012 01:39 PM   Modules accepted: Orders

## 2012-03-05 ENCOUNTER — Ambulatory Visit: Payer: Self-pay | Admitting: Family Medicine

## 2012-06-11 ENCOUNTER — Ambulatory Visit: Payer: Self-pay | Admitting: Family Medicine

## 2012-09-11 ENCOUNTER — Ambulatory Visit: Payer: Medicare Other | Admitting: Neurosurgery

## 2012-10-01 ENCOUNTER — Encounter (INDEPENDENT_AMBULATORY_CARE_PROVIDER_SITE_OTHER): Payer: Medicare Other | Admitting: *Deleted

## 2012-10-01 ENCOUNTER — Encounter: Payer: Self-pay | Admitting: Vascular Surgery

## 2012-10-01 DIAGNOSIS — Z48812 Encounter for surgical aftercare following surgery on the circulatory system: Secondary | ICD-10-CM

## 2012-10-01 DIAGNOSIS — I724 Aneurysm of artery of lower extremity: Secondary | ICD-10-CM

## 2012-10-07 ENCOUNTER — Other Ambulatory Visit: Payer: Self-pay

## 2013-01-07 ENCOUNTER — Other Ambulatory Visit: Payer: Self-pay

## 2013-05-12 ENCOUNTER — Ambulatory Visit: Payer: Self-pay | Admitting: Family Medicine

## 2013-09-01 DIAGNOSIS — W19XXXA Unspecified fall, initial encounter: Secondary | ICD-10-CM

## 2013-09-01 DIAGNOSIS — Y92009 Unspecified place in unspecified non-institutional (private) residence as the place of occurrence of the external cause: Secondary | ICD-10-CM

## 2013-09-01 HISTORY — DX: Unspecified place in unspecified non-institutional (private) residence as the place of occurrence of the external cause: Y92.009

## 2013-09-01 HISTORY — DX: Unspecified place in unspecified non-institutional (private) residence as the place of occurrence of the external cause: W19.XXXA

## 2013-09-09 ENCOUNTER — Ambulatory Visit: Payer: Medicare Other | Admitting: Cardiology

## 2013-09-24 ENCOUNTER — Other Ambulatory Visit: Payer: Self-pay | Admitting: *Deleted

## 2013-09-24 DIAGNOSIS — I739 Peripheral vascular disease, unspecified: Secondary | ICD-10-CM

## 2013-09-24 DIAGNOSIS — Z48812 Encounter for surgical aftercare following surgery on the circulatory system: Secondary | ICD-10-CM

## 2013-10-12 ENCOUNTER — Encounter: Payer: Self-pay | Admitting: Family

## 2013-10-13 ENCOUNTER — Ambulatory Visit (INDEPENDENT_AMBULATORY_CARE_PROVIDER_SITE_OTHER): Payer: Medicare Other | Admitting: Family

## 2013-10-13 ENCOUNTER — Encounter: Payer: Self-pay | Admitting: Family

## 2013-10-13 ENCOUNTER — Ambulatory Visit (INDEPENDENT_AMBULATORY_CARE_PROVIDER_SITE_OTHER)
Admission: RE | Admit: 2013-10-13 | Discharge: 2013-10-13 | Disposition: A | Discharge status: Home / Self Care | Payer: Medicare Other | Source: Ambulatory Visit | Attending: Family | Admitting: Family

## 2013-10-13 ENCOUNTER — Ambulatory Visit (HOSPITAL_COMMUNITY)
Admission: RE | Admit: 2013-10-13 | Discharge: 2013-10-13 | Disposition: A | Discharge status: Home / Self Care | Payer: Medicare Other | Source: Ambulatory Visit | Attending: Family | Admitting: Family

## 2013-10-13 VITALS — BP 121/74 | HR 69 | Resp 16 | Ht 71.0 in | Wt 209.0 lb

## 2013-10-13 DIAGNOSIS — I724 Aneurysm of artery of lower extremity: Secondary | ICD-10-CM | POA: Insufficient documentation

## 2013-10-13 DIAGNOSIS — I739 Peripheral vascular disease, unspecified: Secondary | ICD-10-CM

## 2013-10-13 DIAGNOSIS — Z48812 Encounter for surgical aftercare following surgery on the circulatory system: Secondary | ICD-10-CM | POA: Insufficient documentation

## 2013-10-13 DIAGNOSIS — L819 Disorder of pigmentation, unspecified: Secondary | ICD-10-CM

## 2013-10-13 NOTE — Addendum Note (Signed)
Addended by: MCCHESNEY, MARILYN K on: 10/13/2013 05:29 PM   Modules accepted: Orders  

## 2013-10-13 NOTE — Patient Instructions (Addendum)
Peripheral Vascular Disease Peripheral Vascular Disease (PVD), also called Peripheral Arterial Disease (PAD), is a circulation problem caused by cholesterol (atherosclerotic plaque) deposits in the arteries. PVD commonly occurs in the lower extremities (legs) but it can occur in other areas of the body, such as your arms. The cholesterol buildup in the arteries reduces blood flow which can cause pain and other serious problems. The presence of PVD can place a person at risk for Coronary Artery Disease (CAD).  CAUSES  Causes of PVD can be many. It is usually associated with more than one risk factor such as:   High Cholesterol.  Smoking.  Diabetes.  Lack of exercise or inactivity.  High blood pressure (hypertension).  Obesity.  Family history. SYMPTOMS   When the lower extremities are affected, patients with PVD may experience:  Leg pain with exertion or physical activity. This is called INTERMITTENT CLAUDICATION. This may present as cramping or numbness with physical activity. The location of the pain is associated with the level of blockage. For example, blockage at the abdominal level (distal abdominal aorta) may result in buttock or hip pain. Lower leg arterial blockage may result in calf pain.  As PVD becomes more severe, pain can develop with less physical activity.  In people with severe PVD, leg pain may occur at rest.  Other PVD signs and symptoms:  Leg numbness or weakness.  Coldness in the affected leg or foot, especially when compared to the other leg.  A change in leg color.  Patients with significant PVD are more prone to ulcers or sores on toes, feet or legs. These may take longer to heal or may reoccur. The ulcers or sores can become infected.  If signs and symptoms of PVD are ignored, gangrene may occur. This can result in the loss of toes or loss of an entire limb.  Not all leg pain is related to PVD. Other medical conditions can cause leg pain such  as:  Blood clots (embolism) or Deep Vein Thrombosis.  Inflammation of the blood vessels (vasculitis).  Spinal stenosis. DIAGNOSIS  Diagnosis of PVD can involve several different types of tests. These can include:  Pulse Volume Recording Method (PVR). This test is simple, painless and does not involve the use of X-rays. PVR involves measuring and comparing the blood pressure in the arms and legs. An ABI (Ankle-Brachial Index) is calculated. The normal ratio of blood pressures is 1. As this number becomes smaller, it indicates more severe disease.  < 0.95 - indicates significant narrowing in one or more leg vessels.  <0.8 - there will usually be pain in the foot, leg or buttock with exercise.  <0.4 - will usually have pain in the legs at rest.  <0.25 - usually indicates limb threatening PVD.  Doppler detection of pulses in the legs. This test is painless and checks to see if you have a pulses in your legs/feet.  A dye or contrast material (a substance that highlights the blood vessels so they show up on x-ray) may be given to help your caregiver better see the arteries for the following tests. The dye is eliminated from your body by the kidney's. Your caregiver may order blood work to check your kidney function and other laboratory values before the following tests are performed:  Magnetic Resonance Angiography (MRA). An MRA is a picture study of the blood vessels and arteries. The MRA machine uses a large magnet to produce images of the blood vessels.  Computed Tomography Angiography (CTA). A CTA   is a specialized x-ray that looks at how the blood flows in your blood vessels. An IV may be inserted into your arm so contrast dye can be injected.  Angiogram. Is a procedure that uses x-rays to look at your blood vessels. This procedure is minimally invasive, meaning a small incision (cut) is made in your groin. A small tube (catheter) is then inserted into the artery of your groin. The catheter  is guided to the blood vessel or artery your caregiver wants to examine. Contrast dye is injected into the catheter. X-rays are then taken of the blood vessel or artery. After the images are obtained, the catheter is taken out. TREATMENT  Treatment of PVD involves many interventions which may include:  Lifestyle changes:  Quitting smoking.  Exercise.  Following a low fat, low cholesterol diet.  Control of diabetes.  Foot care is very important to the PVD patient. Good foot care can help prevent infection.  Medication:  Cholesterol-lowering medicine.  Blood pressure medicine.  Anti-platelet drugs.  Certain medicines may reduce symptoms of Intermittent Claudication.  Interventional/Surgical options:  Angioplasty. An Angioplasty is a procedure that inflates a balloon in the blocked artery. This opens the blocked artery to improve blood flow.  Stent Implant. A wire mesh tube (stent) is placed in the artery. The stent expands and stays in place, allowing the artery to remain open.  Peripheral Bypass Surgery. This is a surgical procedure that reroutes the blood around a blocked artery to help improve blood flow. This type of procedure may be performed if Angioplasty or stent implants are not an option. SEEK IMMEDIATE MEDICAL CARE IF:   You develop pain or numbness in your arms or legs.  Your arm or leg turns cold, becomes blue in color.  You develop redness, warmth, swelling and pain in your arms or legs. MAKE SURE YOU:   Understand these instructions.  Will watch your condition.  Will get help right away if you are not doing well or get worse. Document Released: 03/28/2004 Document Revised: 05/13/2011 Document Reviewed: 02/23/2008 ExitCare Patient Information 2015 [employer]. This information is not intended to replace advice given to you by your health care provider. Make sure you discuss any questions you have with your health care provider.   Venous Stasis or  Chronic Venous Insufficiency Chronic venous insufficiency, also called venous stasis, is a condition that affects the veins in the legs. The condition prevents blood from being pumped through these veins effectively. Blood may no longer be pumped effectively from the legs back to the heart. This condition can range from mild to severe. With proper treatment, you should be able to continue with an active life. CAUSES  Chronic venous insufficiency occurs when the vein walls become stretched, weakened, or damaged or when valves within the vein are damaged. Some common causes of this include:  High blood pressure inside the veins (venous hypertension).  Increased blood pressure in the leg veins from long periods of sitting or standing.  A blood clot that blocks blood flow in a vein (deep vein thrombosis).  Inflammation of a superficial vein (phlebitis) that causes a blood clot to form. RISK FACTORS Various things can make you more likely to develop chronic venous insufficiency, including:  Family history of this condition.  Obesity.  Pregnancy.  Sedentary lifestyle.  Smoking.  Jobs requiring long periods of standing or sitting in one place.  Being a certain age. Women in their 40s and 50s and men in their 70s are more   likely to develop this condition. SIGNS AND SYMPTOMS  Symptoms may include:   Varicose veins.  Skin breakdown or ulcers.  Reddened or discolored skin on the leg.  Brown, smooth, tight, and painful skin just above the ankle, usually on the inside surface (lipodermatosclerosis).  Swelling. DIAGNOSIS  To diagnose this condition, your health care provider will take a medical history and do a physical exam. The following tests may be ordered to confirm the diagnosis:  Duplex ultrasound--A procedure that produces a picture of a blood vessel and nearby organs and also provides information on blood flow through the blood vessel.  Plethysmography--A procedure that tests  blood flow.  A venogram, or venography--A procedure used to look at the veins using X-ray and dye. TREATMENT The goals of treatment are to help you return to an active life and to minimize pain or disability. Treatment will depend on the severity of the condition. Medical procedures may be needed for severe cases. Treatment options may include:   Use of compression stockings. These can help with symptoms and lower the chances of the problem getting worse, but they do not cure the problem.  Sclerotherapy--A procedure involving an injection of a material that "dissolves" the damaged veins. Other veins in the network of blood vessels take over the function of the damaged veins.  Surgery to remove the vein or cut off blood flow through the vein (vein stripping or laser ablation surgery).  Surgery to repair a valve. HOME CARE INSTRUCTIONS   Wear compression stockings as directed by your health care provider.  Only take over-the-counter or prescription medicines for pain, discomfort, or fever as directed by your health care provider.  Follow up with your health care provider as directed. SEEK MEDICAL CARE IF:   You have redness, swelling, or increasing pain in the affected area.  You see a red streak or line that extends up or down from the affected area.  You have a breakdown or loss of skin in the affected area, even if the breakdown is small.  You have an injury to the affected area. SEEK IMMEDIATE MEDICAL CARE IF:   You have an injury and open wound in the affected area.  Your pain is severe and does not improve with medicine.  You have sudden numbness or weakness in the foot or ankle below the affected area, or you have trouble moving your foot or ankle.  You have a fever or persistent symptoms for more than 2-3 days.  You have a fever and your symptoms suddenly get worse. MAKE SURE YOU:   Understand these instructions.  Will watch your condition.  Will get help right  away if you are not doing well or get worse. Document Released: 06/24/2006 Document Revised: 12/09/2012 Document Reviewed: 10/26/2012 [facility] Patient Information 2015 [place], [place]. This information is not intended to replace advice given to you by your health care provider. Make sure you discuss any questions you have with your health care provider.  Obtain 20-30 mm mercury graduated pressure zip up knee high compression hose at a medical supply store. Measure to fit. Wear during the day,remove at night. Elevate feet above the level of the heart when not walking.

## 2013-10-13 NOTE — Progress Notes (Signed)
[facility] [place] HISTORY AND PHYSICAL -PAD  History of Present Illness [name] [name] is a 80 y.o. male patient of Dr. Darrick Penna who status post right above-the-knee to below the knee popliteal graft placed October 2010. He returns today for follow up. His son in law states that pt rarely walks, uses a walker when he does walk, and has trouble with falling. Pt denies non healing wounds, denies rest pain. He specifically denies pain behind either knee. It seems that pt rarely elevates his legs sufficiently. Pt has tried knee high compression hose and is not able to donn.  Pt denies any history of stroke or TIA. Pt Diabetic: No Pt smoker: former smoker, quit in 1948  Pt meds include: Statin :No ASA: Yes Other anticoagulants/antiplatelets: no  Past Medical History  Diagnosis Date  . CAD (coronary artery disease)     nuclear 01/2008, no ischemia, EF 60%, distal anterior scar  . Hyperlipidemia   . Syncope     .remote..no etiology  /  syncope 11/2008.. from acute leg pain  . Venous insufficiency     support hose  . Carotid artery disease     doppler 05/2009,  0-39% bilateral / doppler 06/2010 OK  . Elevated CPK     11/23/08.Marland KitchenMarland Kitchenprobably from debris from popliteal aneurysm  . Popliteal aneurysm     .repair..11/25/2008  Dr. Darrick Penna  . Speech problem     Neurologic event with brief infusion and speech difficulty.... January, 2011.... being assessed by neurology  February, 2011  . Hx of CABG     1998  . Ejection fraction     EF 45%, echo,05/2009,apical hypo,  ?? posterior and lateral hypo  . Prominent abdominal aortic pulsation     doppler normal  . Intracranial hemorrhage     Two small subependymal bleeds 06/2010,,ASA held  . Bradycardia     06/2010  . Edema     Ankle edema August, 2012  . TIA (transient ischemic attack)     Brief episode of confusion and speech difficulty, January, 2012  /  recurrent confusion speech difficulty September, 2012  . PAD (peripheral  artery disease)     Above the knee to below the knee popliteal bypass,, right  //    Doppler,  July, 2013, patent    Social History History  Substance Use Topics  . Smoking status: Former Smoker -- 20 years    Types: Cigarettes    Quit date: 09/12/1946  . Smokeless tobacco: Never Used     Comment: quit appox 50 yrs ago  . Alcohol Use: No    Family History Family History  Problem Relation Age of Onset  . Diabetes Sister   . Stroke Mother   . Diabetes Mother     Past Surgical History  Procedure Laterality Date  . Coronary artery bypass graft  1998  . Popliteal bypass  with reversed greater saphenous vein from right leg.      Allergies  Allergen Reactions  . Penicillins   . Sulfonamide Derivatives     Current Outpatient Prescriptions  Medication Sig Dispense Refill  . aspirin 81 MG tablet Take 81 mg by mouth daily.        . Calcium Carbonate (CALCIUM 500 PO) 2 tabs po qd       . Multiple Vitamin (MULTIVITAMIN) capsule Take 1 capsule by mouth daily.         No current facility-administered medications for this visit.    ROS: See HPI  for pertinent positives and negatives.   Physical Examination  Filed Vitals:   10/13/13 1207  BP: 121/74  Pulse: 69  Resp: 16  Height: 5\' 11"  (1.803 m)  Weight: 209 lb (94.802 kg)  SpO2: 98%   Body mass index is 29.16 kg/(m^2).  General: A&O x 3, WDWN. Gait: in wheelchair Eyes: PERRLA. Pulmonary: CTAB, without wheezes , rales or rhonchi. Cardiac: regular Rythm , without detected murmur.         Carotid Bruits Right Left   Negative Negative   Aorta is not palpable. Radial pulses: are 2+ palpable and =                           VASCULAR EXAM: Extremities without ischemic changes  without Gangrene; without open wounds. 2-3+ pitting and non pitting edema in lower legs, dependent edema.                                                                                                          LE Pulses Right Left        POPLITEAL  not palpable   not palpable       POSTERIOR TIBIAL  not palpable   not palpable        DORSALIS PEDIS      ANTERIOR TIBIAL 2+ palpable  2+ palpable    Abdomen: soft, NT, no masses. Skin: no rashes, no ulcers noted. Musculoskeletal: no muscle wasting or atrophy.  Neurologic: A&O X 3; Appropriate Affect ; SENSATION: normal; MOTOR FUNCTION:  moving all extremities equally, motor strength 4/5 in UE's, 3/5 in LE's. Speech is fluent/normal. CN 2-12 intact except is hard of hearing.    Non-Invasive Vascular Imaging: DATE: 10/13/2013 ABI: RIGHT 1.29, TBI: 1.34, Waveforms: triphasic;  LEFT 1.36, TBI: 1.29, indicating the presence of tibial artery medial calcification; however, waveforms do not indicate arterial insufficiency, Waveforms: triphasic. Previous (10/01/12) ABI's: Right: 1.16, Left: 1.38 DUPLEX SCAN OF BYPASS: widely patent right LE graft with biphasic waveforms. Baker's cyst noted posterior to right knee.  ASSESSMENT: [name] [name] is a 80 y.o. male who is status post right above-the-knee to below the knee popliteal graft placed October 2010. He rarely walks, he has no rest pain, no slow healing wounds. ABI's today indicate no evidence of arterial occlusion in either leg. Baker's cyst detected posterior to right knee which is asymptomatic. The right LE popliteal graft is widely patent. He does have dependent edema in both lower legs, seems to keep his legs in a dependent position. See Plan.  PLAN:  Since he is at risk for falls he is not a candidate for walking as exercise. Seated daily exercises for legs and arms discussed and demonstrated.  Venous insufficiency: elevate lower legs above heart level when not walking; zip up knee high graduated compression hose 20-30 mm Hg during the day, measure to fit. I discussed in depth with the patient the nature of atherosclerosis, and emphasized the importance of maximal medical management including strict control of blood  pressure,  blood glucose, and lipid levels, obtaining regular exercise, and continued cessation of smoking.  The patient is aware that without maximal medical management the underlying atherosclerotic disease process will progress, limiting the benefit of any interventions.  Based on the patient's vascular studies and examination, pt will return to clinic in 1 year for ABI's and RLE arterial Duplex.  The patient was given information about PAD including signs, symptoms, treatment, what symptoms should prompt the patient to seek immediate medical care, and risk reduction measures to take.  Charisse March, RN, MSN, FNP-C [facility] Phone: [phone]  Clinic MD: Edilia Bo  10/13/2013 11:23 AM

## 2013-10-21 ENCOUNTER — Encounter: Payer: Self-pay | Admitting: Cardiology

## 2013-10-21 ENCOUNTER — Ambulatory Visit (INDEPENDENT_AMBULATORY_CARE_PROVIDER_SITE_OTHER): Payer: Medicare Other | Admitting: Cardiology

## 2013-10-21 VITALS — BP 110/80 | HR 89 | Ht 71.0 in | Wt 206.2 lb

## 2013-10-21 DIAGNOSIS — R001 Bradycardia, unspecified: Secondary | ICD-10-CM

## 2013-10-21 DIAGNOSIS — R609 Edema, unspecified: Secondary | ICD-10-CM

## 2013-10-21 DIAGNOSIS — I498 Other specified cardiac arrhythmias: Secondary | ICD-10-CM

## 2013-10-21 DIAGNOSIS — I25810 Atherosclerosis of coronary artery bypass graft(s) without angina pectoris: Secondary | ICD-10-CM

## 2013-10-21 DIAGNOSIS — R55 Syncope and collapse: Secondary | ICD-10-CM

## 2013-10-21 NOTE — Patient Instructions (Signed)
**Note De-identified  Obfuscation** Your physician recommends that you continue on your current medications as directed. Please refer to the Current Medication list given to you today.  Your physician wants you to follow-up in: 1 year. You will receive a reminder letter in the mail two months in advance. If you don't receive a letter, please call our office to schedule the follow-up appointment.  

## 2013-10-21 NOTE — Progress Notes (Signed)
Patient ID: [name] [name], male   DOB: 05/09/1921, 80 y.o.   MRN: [id number]    HPI  Patient is seen today to followup his history of coronary disease and syncope in the past. I saw him last November, 2013. He actually has done well since then. He had followup with vascular surgery and the graft in his right leg is doing well. He has not had any recurrent significat syncopal spells.  Allergies  Allergen Reactions  . Penicillins Hives  . Sulfonamide Derivatives Rash    Current Outpatient Prescriptions  Medication Sig Dispense Refill  . aspirin 81 MG tablet Take 81 mg by mouth daily.        . Calcium Carbonate (CALCIUM 500 PO) 2 tabs po qd       . Multiple Vitamin (MULTIVITAMIN) capsule Take 1 capsule by mouth daily.         No current facility-administered medications for this visit.    History   Social History  . Marital Status: Married    Spouse Name: N/A    Number of Children: N/A  . Years of Education: N/A   Occupational History  . Not on file.   Social History Main Topics  . Smoking status: Former Smoker -- 20 years    Types: Cigarettes    Quit date: 09/12/1946  . Smokeless tobacco: Never Used     Comment: quit appox 50 yrs ago  . Alcohol Use: No  . Drug Use: No  . Sexual Activity: Not on file   Other Topics Concern  . Not on file   Social History Narrative  . No narrative on file    Family History  Problem Relation Age of Onset  . Diabetes Sister   . Stroke Mother   . Diabetes Mother     Past Medical History  Diagnosis Date  . CAD (coronary artery disease)     nuclear 01/2008, no ischemia, EF 60%, distal anterior scar  . Hyperlipidemia   . Syncope     .remote..no etiology  /  syncope 11/2008.. from acute leg pain  . Venous insufficiency     support hose  . Carotid artery disease     doppler 05/2009,  0-39% bilateral / doppler 06/2010 OK  . Elevated CPK     11/23/08.Marland Kitchen.Marland Kitchen.probably from debris from popliteal aneurysm  . Popliteal aneurysm    .repair..11/25/2008  Dr. Darrick PennaFields  . Speech problem     Neurologic event with brief infusion and speech difficulty.... January, 2011.... being assessed by neurology  February, 2011  . Hx of CABG     1998  . Ejection fraction     EF 45%, echo,05/2009,apical hypo,  ?? posterior and lateral hypo  . Prominent abdominal aortic pulsation     doppler normal  . Intracranial hemorrhage     Two small subependymal bleeds 06/2010,,ASA held  . Bradycardia     06/2010  . Edema     Ankle edema August, 2012  . TIA (transient ischemic attack)     Brief episode of confusion and speech difficulty, January, 2012  /  recurrent confusion speech difficulty September, 2012  . PAD (peripheral artery disease)     Above the knee to below the knee popliteal bypass,, right  //    Doppler,  July, 2013, patent  . Fall at home July 2015    Past Surgical History  Procedure Laterality Date  . Coronary artery bypass graft  1998  . Popliteal bypass  with reversed greater  saphenous vein from right leg.      Patient Active Problem List   Diagnosis Date Noted  . Popliteal artery aneurysm 10/13/2013  . Discoloration of skin of foot-Left 10/13/2013  . PAD (peripheral artery disease)   . TIA (transient ischemic attack)   . Edema   . Hyperlipidemia   . Syncope   . CAD (coronary artery disease)   . Venous insufficiency   . Carotid artery disease   . Elevated CPK   . Popliteal aneurysm   . Speech problem   . Hx of CABG   . Ejection fraction   . Prominent abdominal aortic pulsation   . Intracranial hemorrhage   . Bradycardia     ROS   Patient denies fever, chills, headache, sweats, rash, change in vision, change in hearing, chest pain, cough, nausea vomiting, urinary symptoms. All other systems are reviewed and are negative.  PHYSICAL EXAM  Patient comes in with his sliding walker. He is limited but stable. He is oriented to person time and place. Affect is normal. He's here with his wife and his daughter. They  live in [place]. His daughter comes in from [place]. His wife has a significant tremor. Head is atraumatic. Sclera and conjunctiva are normal. There is no jugular venous distention. Lungs are clear. Respiratory effort is nonlabored. Cardiac exam reveals S1 and S2. There no clicks or significant murmurs. The abdomen is soft. The patient has chronic mild edema is unchanged.  Filed Vitals:   10/21/13 1625  BP: 110/80  Pulse: 89  Height: 5\' 11"  (1.803 m)  Weight: 206 lb 3.2 oz (93.532 kg)    EKG is done today and reviewed by me. There is old decreased anterior R wave progression. There is normal sinus rhythm.  ASSESSMENT & PLAN

## 2013-10-21 NOTE — Assessment & Plan Note (Signed)
There is been no recurrent syncope. No further workup.

## 2013-10-21 NOTE — Assessment & Plan Note (Signed)
His rhythm is stable. No change in therapy.

## 2013-10-21 NOTE — Assessment & Plan Note (Signed)
Coronary disease is stable. No change in therapy. 

## 2013-10-21 NOTE — Assessment & Plan Note (Signed)
He has chronic edema. No change in therapy.

## 2014-02-28 DIAGNOSIS — I739 Peripheral vascular disease, unspecified: Secondary | ICD-10-CM | POA: Insufficient documentation

## 2014-02-28 DIAGNOSIS — Z951 Presence of aortocoronary bypass graft: Secondary | ICD-10-CM | POA: Insufficient documentation

## 2014-02-28 DIAGNOSIS — R55 Syncope and collapse: Secondary | ICD-10-CM | POA: Insufficient documentation

## 2014-02-28 DIAGNOSIS — E7849 Other hyperlipidemia: Secondary | ICD-10-CM | POA: Insufficient documentation

## 2014-06-26 NOTE — Discharge Summary (Signed)
PATIENT NAME:  [name], [name] [name]#:  [id number] DATE OF BIRTH:  1921/12/04  DATE OF ADMISSION:  02/12/2011 DATE OF DISCHARGE:  02/13/2011  ADDENDUM:  Repeat CBC at the time of discharge showed improvement. His hemoglobin has improved to 13.6, white count 7.5, platelet count 142,000. His counts are stable. He can have follow-up CBC as outpatient.   ____________________________ Fredia SorrowAbhinav Lamone Ferrelli, MD ag:drc D: 02/13/2011 16:14:12 ET T: 02/14/2011 10:47:18 ET JOB#: [id number]  cc: Fredia SorrowAbhinav Justice Aguirre, MD, <Dictator> Steele SizerMark A. Crissman, MD Fredia SorrowABHINAV Paiton Boultinghouse MD ELECTRONICALLY SIGNED 03/15/2011 12:01

## 2014-06-26 NOTE — Discharge Summary (Signed)
PATIENT NAME:  [name] [name], [name] [name] MR#:  [id number] DATE OF BIRTH:  Aug 03, 1921  DATE OF ADMISSION:  02/12/2011 DATE OF DISCHARGE:  02/13/2011  DISCHARGE DIAGNOSES:  1. Dizziness status post fall.  2. History of coronary artery disease. 3. History of transient ischemic attack. 4. Mild thrombocytopenia.   HOSPITAL COURSE: The patient is an 81 year old male who has history of coronary artery disease with previous coronary artery bypass graft and also history of TIA. He presented with dizziness and he had a fall at church. He fell backward hitting his head. He had a small laceration on the back of the head. He did not have any syncopal episode at that time. Apparently he was admitted in [place] in April because of a syncopal episode. An echo was done at that time so echo was not repeated during this hospital stay. He was admitted to telemetry. He remained in sinus rhythm. His EKG only showed sinus rhythm. No acute ischemic changes. His white count was normal at 7.7 and hemoglobin was 14.1. His platelet count was 146,000 when he came in and 127,000 today. No significant drop in the platelet count. This can be followed as outpatient. He ruled out for MI. His creatinine and BUN was normal. His BUN was 16 and creatinine was 0.9 when he came in and today his BUN  is 14 with a creatinine of 0.67. His LFTs were normal. He has no further dizziness. He had a CT of the head and cervical spine done at admission. CT head showed chronic findings without any acute abnormality. CT of the cervical spine no CT evidence of acute osseous abnormality, multilevel spondylosis. He did not have any significant orthostatics because his lying blood pressure was 119/71 with a standing blood pressure of 112/73. He denies any tingling, numbness, or weakness. He denies any chest pain or shortness of breath. He was evaluated by physical therapy and they suggested home health with PT which will be arranged for the patient. I also advised the  patient to follow-up with his cardiologist, Dr. Myrtis SerKatz, as outpatient in 1 to 2 weeks.   MEDICATIONS AT DISCHARGE (HOME MEDICATIONS: 1. Aspirin 81 mg daily. 2. Simvastatin 20 mg daily.  3. Multivitamin. 4. Calcium with Vitamin D.  DIET: Advised a low sodium, low cholesterol diet.   ACTIVITY: As tolerated.   CONDITION AT DISCHARGE: He is comfortable. T-max 98.9, heart rate 78, blood pressure 116/75, saturating 97% on room air. Chest is clear. Heart sounds are regular. Abdomen soft, nontender. He is awake, alert, oriented x3. No cranial nerve deficit. No neurological deficits.   FOLLOW-UP:  1. The patient should follow-up with Dr. Myrtis SerKatz at [facility] in [place] in 1 to 2 weeks 2. Follow-up with Dr. Dossie Arbourrissman in 1 to 2 weeks. 3. Follow-up CBC at Dr. Christell Faithrissman's office to monitor platelet count. 4. Home health with PT.  I explained to the patient and the daughter in detail at the time of discharge. Will also ambulate him for any symptoms before discharge.   TIME SPENT WITH THE DISCHARGE: 40 minutes.   ____________________________ Fredia SorrowAbhinav Castin Donaghue, MD ag:drc D: 02/13/2011 10:31:35 ET T: 02/13/2011 13:26:11 ET JOB#: [id number]  cc: Fredia SorrowAbhinav Osten Janek, MD, <Dictator> Steele SizerMark A. Crissman, MD Luis AbedJeffrey D. Katz, MD Fredia SorrowABHINAV Judeth Gilles MD ELECTRONICALLY SIGNED 03/15/2011 12:00

## 2014-08-22 DIAGNOSIS — Z85828 Personal history of other malignant neoplasm of skin: Secondary | ICD-10-CM | POA: Insufficient documentation

## 2014-08-22 DIAGNOSIS — Z87898 Personal history of other specified conditions: Secondary | ICD-10-CM | POA: Insufficient documentation

## 2014-08-29 ENCOUNTER — Other Ambulatory Visit: Payer: Self-pay

## 2014-09-23 ENCOUNTER — Telehealth: Payer: Self-pay | Admitting: Family Medicine

## 2014-09-23 NOTE — Telephone Encounter (Signed)
Aislinn from [facility] called stated she needs a verbal order to continue pt's PT twice a week for 8 weeks. PT had a slip earlier this week with no injury. PT reevaluated yesterday and determined further PT is needed.

## 2014-09-26 NOTE — Telephone Encounter (Signed)
Pt as requested is ok

## 2014-09-26 NOTE — Telephone Encounter (Signed)
Called in verbal ok

## 2014-10-19 ENCOUNTER — Encounter: Payer: Self-pay | Admitting: Family

## 2014-10-19 ENCOUNTER — Telehealth: Payer: Self-pay | Admitting: Family Medicine

## 2014-10-19 NOTE — Telephone Encounter (Signed)
OK for a  medical social worker to visit him.

## 2014-10-19 NOTE — Telephone Encounter (Signed)
Verbal order for medical social worker to visit him.

## 2014-10-20 ENCOUNTER — Other Ambulatory Visit: Payer: Self-pay | Admitting: Family

## 2014-10-20 ENCOUNTER — Ambulatory Visit (HOSPITAL_COMMUNITY)
Admission: RE | Admit: 2014-10-20 | Discharge: 2014-10-20 | Disposition: A | Discharge status: Home / Self Care | Payer: Medicare Other | Source: Ambulatory Visit | Attending: Family | Admitting: Family

## 2014-10-20 ENCOUNTER — Encounter: Payer: Self-pay | Admitting: Family

## 2014-10-20 ENCOUNTER — Ambulatory Visit (INDEPENDENT_AMBULATORY_CARE_PROVIDER_SITE_OTHER): Payer: Medicare Other | Admitting: Family

## 2014-10-20 VITALS — BP 116/70 | HR 76 | Temp 97.8°F | Resp 16 | Ht 71.5 in | Wt 206.0 lb

## 2014-10-20 DIAGNOSIS — I872 Venous insufficiency (chronic) (peripheral): Secondary | ICD-10-CM | POA: Diagnosis not present

## 2014-10-20 DIAGNOSIS — Z4889 Encounter for other specified surgical aftercare: Secondary | ICD-10-CM

## 2014-10-20 DIAGNOSIS — Z95828 Presence of other vascular implants and grafts: Secondary | ICD-10-CM

## 2014-10-20 DIAGNOSIS — Z9889 Other specified postprocedural states: Secondary | ICD-10-CM | POA: Diagnosis not present

## 2014-10-20 DIAGNOSIS — Z48812 Encounter for surgical aftercare following surgery on the circulatory system: Secondary | ICD-10-CM

## 2014-10-20 DIAGNOSIS — I724 Aneurysm of artery of lower extremity: Secondary | ICD-10-CM

## 2014-10-20 NOTE — Patient Instructions (Signed)

## 2014-10-20 NOTE — Progress Notes (Addendum)
[name] [name] HISTORY AND PHYSICAL   History of Present Illness DEJA KAIGLER is a 81 y.o. male patient of Dr. Darrick Penna who status post right above-the-knee to below the knee popliteal graft placed October 2010. He returns today for follow up. His son in law states that pt rarely walks, uses a walker when he does walk, and has trouble with falling.  Physical therapy is coming to his home three times/week to help him maintain his functional status per son in law. Pt denies non healing wounds, denies rest pain. He specifically denies pain behind either knee. Son in Social worker and pt agree that his feet do not seem to be less edematous in the morning after all night elevation in his bed. Pt has tried knee high compression hose and is not able to donn.  Pt denies any history of stroke or TIA. Pt Diabetic: No Pt smoker: former smoker, quit in 1948  Pt meds include: Statin :No ASA: Yes Other anticoagulants/antiplatelets: no     Past Medical History  Diagnosis Date  . CAD (coronary artery disease)     nuclear 01/2008, no ischemia, EF 60%, distal anterior scar  . Hyperlipidemia   . Syncope     .remote..no etiology  /  syncope 11/2008.. from acute leg pain  . Venous insufficiency     support hose  . Carotid artery disease     doppler 05/2009,  0-39% bilateral / doppler 06/2010 OK  . Elevated CPK     11/23/08.Marland KitchenMarland Kitchenprobably from debris from popliteal aneurysm  . Popliteal aneurysm     .repair..11/25/2008  Dr. Darrick Penna  . Speech problem     Neurologic event with brief infusion and speech difficulty.... January, 2011.... being assessed by neurology  February, 2011  . Hx of CABG     1998  . Ejection fraction     EF 45%, echo,05/2009,apical hypo,  ?? posterior and lateral hypo  . Prominent abdominal aortic pulsation     doppler normal  . Intracranial hemorrhage     Two small subependymal bleeds 06/2010,,ASA held  . Bradycardia     06/2010  . Edema     Ankle edema August,  2012  . TIA (transient ischemic attack)     Brief episode of confusion and speech difficulty, January, 2012  /  recurrent confusion speech difficulty September, 2012  . PAD (peripheral artery disease)     Above the knee to below the knee popliteal bypass,, right  //    Doppler,  July, 2013, patent  . Fall at home July 2015    Social History Social History  Substance Use Topics  . Smoking status: Former Smoker -- 20 years    Types: Cigarettes    Quit date: 09/12/1946  . Smokeless tobacco: Never Used     Comment: quit appox 50 yrs ago  . Alcohol Use: No    Family History Family History  Problem Relation Age of Onset  . Diabetes Sister   . Stroke Mother   . Diabetes Mother     Past Surgical History  Procedure Laterality Date  . Coronary artery bypass graft  1998  . Popliteal bypass  with reversed greater saphenous vein from right leg.      Allergies  Allergen Reactions  . Penicillins Hives  . Sulfonamide Derivatives Rash    Current Outpatient Prescriptions  Medication Sig Dispense Refill  . aspirin 81 MG tablet Take 81 mg by mouth daily.      Marland Kitchen  Calcium Carbonate (CALCIUM 500 PO) 2 tabs po qd     . Multiple Vitamin (MULTIVITAMIN) capsule Take 1 capsule by mouth daily.       No current facility-administered medications for this visit.    ROS: See HPI for pertinent positives and negatives.   Physical Examination  Filed Vitals:   10/20/14 1420  BP: 116/70  Pulse: 76  Temp: 97.8 F (36.6 C)  TempSrc: Oral  Resp: 16  Height: 5' 11.5" (1.816 m)  Weight: 206 lb (93.441 kg)  SpO2: 97%   Body mass index is 28.33 kg/(m^2).   General: A&O x 3, WDWN. Gait: in wheelchair Eyes: PERRLA. Pulmonary: CTAB, without wheezes , rales or rhonchi. Cardiac: regular Rythm , without detected murmur.     Carotid Bruits Right Left   Negative Negative   Aorta is not palpable. Radial pulses: are 2+ palpable and =   [name]  EXAM: Extremities without ischemic changes  without Gangrene; without open wounds. 3-4+ pitting edema in feet, 1-2+ in lower legs, dependent edema. Toes are cyanotic in dependent position, cyanosis tends to resolve with elevation.     LE Pulses Right Left   POPLITEAL not palpable  not palpable   POSTERIOR TIBIAL not palpable  not palpable    DORSALIS PEDIS  ANTERIOR TIBIAL 1+ palpable  1+ palpable    Abdomen: soft, NT, no palpable masses. Skin: no rashes, no ulcers. Musculoskeletal: no muscle wasting or atrophy. Neurologic: A&O X 3; Appropriate Affect; MOTOR FUNCTION: moving all extremities equally, motor strength 4/5 in UE's, 3/5 in LE's. Speech is fluent/normal. CN 2-12 intact except is hard of hearing.          Non-Invasive [name]: DATE: 10/20/2014 LOWER EXTREMITY ARTERIAL DUPLEX EVALUATION    INDICATION: Follow up bypass graft     PREVIOUS INTERVENTION(S): Right above-knee to below-knee popliteal artery bypass graft in 12/2008    DUPLEX EXAM:     RIGHT  LEFT   Peak Systolic Velocity (cm/s) Ratio (if abnormal) Waveform  Peak Systolic Velocity (cm/s) Ratio (if abnormal) Waveform  65  B Inflow Artery     53  B Proximal Anastomosis     101  B Proximal Graft     50  B Mid Graft     53  B  Distal Graft     58  B Distal Anastomosis     99  B Outflow Artery     1.33 Today's ABI / TBI 1.31  1.29 Previous ABI / TBI (10/13/13 ) 1.36    Waveform:    M - Monophasic       B - Biphasic       T - Triphasic  If Ankle Brachial Index (ABI) or Toe Brachial Index (TBI) performed, please see complete report     ADDITIONAL FINDINGS: . No internal vessel narrowing noted within the bypass graft or anastomosis. . Cystic structure with no internal flow noted in the right medial popliteal fossa  measuring roughly 9.6 x 2.0 x 3.6cm.    IMPRESSION: Patent right leg bypass graft with no evidence of stenosis noted.    Compared to the previous exam:  No significant change noted when compared to the exam on 10/13/13.     ASSESSMENT: RANDOL ZUMSTEIN is a 81 y.o. male who is status post right above-the-knee to below the knee popliteal graft placed October 2010. He rarely walks, he has no rest pain, no slow healing wounds. Today's right LE arterial Duplex suggests a patent  right leg bypass graft with no evidence of stenosis. The right Baker's cyst is again noted; this remains aysymptomatic. No significant change noted when compared to the exam on 10/13/13.   PLAN:  Since he is at risk for falls he is not a candidate for walking as exercise. Seated daily exercises for legs and arms discussed and demonstrated; physical therapy has also given him exercises to perform.  Venous insufficiency: elevate lower legs above heart level when not walking; ace wraps applied to both lower legs since he is unable to donn compression hose, son in law instructed in application by Domenic Moras, RN; extra ace bandages given to pt and family for this use. Based on the patient's [name] studies and examination, pt will return to clinic in 1 year for ABI's and RLE arterial Duplex.  I discussed in depth with the patient the nature of atherosclerosis, and emphasized the importance of maximal medical management including strict control of blood pressure, blood glucose, and lipid levels, obtaining regular exercise, and continued cessation of smoking.  The patient is aware that without maximal medical management the underlying atherosclerotic disease process will progress, limiting the benefit of any interventions.  The patient was given information about PAD including signs, symptoms, treatment, what symptoms should prompt the patient to seek immediate medical care, and risk reduction measures to take.  Charisse March,  RN, MSN, FNP-C [name] Phone: [phone]  Clinic MD: Darrick Penna  10/20/2014 1:33 PM

## 2014-11-10 ENCOUNTER — Telehealth: Payer: Self-pay | Admitting: Family Medicine

## 2014-11-10 NOTE — Telephone Encounter (Signed)
Forward to provider

## 2014-11-10 NOTE — Telephone Encounter (Signed)
Needs order for out pt physical therapy.  [phone] fax, [facility] out pt [facility].

## 2014-11-11 NOTE — Telephone Encounter (Signed)
Patient's daughter notified. Appointment scheduled.  

## 2014-11-11 NOTE — Telephone Encounter (Signed)
I can't without knowing what it is for.  It looks like he hasn't been seen here for a while.  Medicare will require a visit.

## 2014-11-21 ENCOUNTER — Telehealth: Payer: Self-pay | Admitting: Family Medicine

## 2014-11-21 NOTE — Telephone Encounter (Signed)
NEEDS VERBAL OK TO CONTINUE PHYSICAL THERAPY 2 TIMES THIS WEEK.

## 2014-11-21 NOTE — Telephone Encounter (Signed)
Continue PT

## 2014-11-22 ENCOUNTER — Encounter: Payer: Self-pay | Admitting: Family Medicine

## 2014-11-22 ENCOUNTER — Ambulatory Visit (INDEPENDENT_AMBULATORY_CARE_PROVIDER_SITE_OTHER): Payer: Medicare Other | Admitting: Family Medicine

## 2014-11-22 ENCOUNTER — Telehealth: Payer: Self-pay

## 2014-11-22 VITALS — BP 111/71 | HR 101 | Temp 97.3°F

## 2014-11-22 DIAGNOSIS — R531 Weakness: Secondary | ICD-10-CM | POA: Diagnosis not present

## 2014-11-22 DIAGNOSIS — R0989 Other specified symptoms and signs involving the circulatory and respiratory systems: Secondary | ICD-10-CM

## 2014-11-22 DIAGNOSIS — R0689 Other abnormalities of breathing: Secondary | ICD-10-CM

## 2014-11-22 NOTE — Telephone Encounter (Signed)
Spoke with patients daughter to let her know that the PT order had been faxed, I received a voicemail concerning the order from the daughter.

## 2014-11-22 NOTE — Progress Notes (Signed)
BP 111/71 mmHg  Pulse 101  Temp(Src) 97.3 F (36.3 C)  SpO2 96%   Subjective:    Patient ID: [name] [name], male    DOB: 1921/04/24, 81 y.o.   MRN: [id number]  HPI: [name] [name] is a 81 y.o. male  Chief Complaint  Patient presents with  . PT Order    Patient needs a order for PT 0   Needs PT referral- working on weakness and stamina, has been helping, but they would like to continue to keep getting him strong.  Had 2 days that he was a little weaker last week with HR in 111-112. Has not been drinking as much water as he is supposed to, but they are working on it.  Has been struggling with allergies- has been taking claritin, sometimes helps.  Has been having some joint pain. Legs still swelling. Has been following with vascular.  Otherwise feeling well with no other concerns or complaints at this time.   Relevant past medical, surgical, family and social history reviewed and updated as indicated. Interim medical history since our last visit reviewed. Allergies and medications reviewed and updated.  Review of Systems  Constitutional: Negative.   Respiratory: Negative.   Cardiovascular: Negative.   Musculoskeletal: Positive for joint swelling and arthralgias. Negative for myalgias, back pain, gait problem, neck pain and neck stiffness.  Psychiatric/Behavioral: Negative.     Per HPI unless specifically indicated above     Objective:    BP 111/71 mmHg  Pulse 101  Temp(Src) 97.3 F (36.3 C)  SpO2 96%  Wt Readings from Last 3 Encounters:  10/20/14 206 lb (93.441 kg)  10/21/13 206 lb 3.2 oz (93.532 kg)  10/13/13 209 lb (94.802 kg)    Physical Exam  Constitutional: He is oriented to person, place, and time. He appears well-developed and well-nourished. No distress.  HENT:  Head: Normocephalic and atraumatic.  Right Ear: Hearing normal.  Left Ear: Hearing normal.  Nose: Nose normal.  Eyes: Conjunctivae and lids are normal. Right eye exhibits no discharge. Left eye  exhibits no discharge. No scleral icterus.  Cardiovascular: Normal rate, regular rhythm and intact distal pulses.  Exam reveals no gallop and no friction rub.   No murmur heard. Pulmonary/Chest: Effort normal. No respiratory distress. He has decreased breath sounds in the right lower field and the left lower field. He has no wheezes. He has no rales. He exhibits no tenderness.  Musculoskeletal: Normal range of motion.  Neurological: He is alert and oriented to person, place, and time.  Skin: Skin is intact. No rash noted.  Psychiatric: He has a normal mood and affect. His speech is normal and behavior is normal. Judgment and thought content normal. Cognition and memory are normal.  Nursing note and vitals reviewed.   Results for orders placed or performed during the hospital encounter of 06/26/10  Urine culture  Result Value Ref Range   Specimen Description URINE, CATHETERIZED    Special Requests NONE    Culture  Setup Time [id number]    Colony Count NO GROWTH    Culture NO GROWTH    Report Status 06/27/2010 FINAL   Culture, blood (routine x 2)  Result Value Ref Range   Specimen Description BLOOD ARM LEFT    Special Requests BOTTLES DRAWN AEROBIC ONLY 8CC    Culture  Setup Time [id number]    Culture NO GROWTH 5 DAYS    Report Status 07/03/2010 FINAL   Culture, blood (routine x 2)  Result Value Ref  Range   Specimen Description BLOOD ARM RIGHT    Special Requests BOTTLES DRAWN AEROBIC ONLY 8CC    Culture  Setup Time [id number]    Culture NO GROWTH 5 DAYS    Report Status 07/03/2010 FINAL   Differential  Result Value Ref Range   Neutrophils Relative % 79 (H) 43 - 77 %   Lymphocytes Relative 12 12 - 46 %   Monocytes Relative 8 3 - 12 %   Eosinophils Relative 1 0 - 5 %   Basophils Relative 0 0 - 1 %   Neutro Abs 5.4 1.7 - 7.7 K/uL   Lymphs Abs 0.8 0.7 - 4.0 K/uL   Monocytes Absolute 0.5 0.1 - 1.0 K/uL   Eosinophils Absolute 0.1 0.0 - 0.7 K/uL   Basophils Absolute 0.0 0.0  - 0.1 K/uL   Smear Review FIBRIN STRANDS NOTED MORPHOLOGY UNREMARKABLE   CBC  Result Value Ref Range   WBC 6.8 4.0 - 10.5 K/uL   RBC 4.52 4.22 - 5.81 MIL/uL   Hemoglobin 14.5 13.0 - 17.0 g/dL   HCT 41.2 39.0 - 52.0 %   MCV 91.2 78.0 - 100.0 fL   MCH 32.1 26.0 - 34.0 pg   MCHC 35.2 30.0 - 36.0 g/dL   RDW 12.9 11.5 - 15.5 %   Platelets  150 - 400 K/uL    PLATELET CLUMPS NOTED ON SMEAR, COUNT APPEARS ADEQUATE  Protime-INR  Result Value Ref Range   Prothrombin Time 13.5 11.6 - 15.2 seconds   INR 1.01 0.00 - 1.49  APTT  Result Value Ref Range   aPTT 21 (L) 24 - 37 seconds  CK total and CKMB  Result Value Ref Range   Total CK 94 7 - 232 U/L   CK, MB 1.6 0.3 - 4.0 ng/mL   Relative Index  0.0 - 2.5    RELATIVE INDEX IS INVALID WHEN CK < 100 U/L         Comprehensive metabolic panel  Result Value Ref Range   Sodium 134 (L) 135 - 145 mEq/L   Potassium 5.2 (H) 3.5 - 5.1 mEq/L   Chloride 100 96 - 112 mEq/L   CO2 26 19 - 32 mEq/L   Glucose, Bld 119 (H) 70 - 99 mg/dL   BUN 16 6 - 23 mg/dL   Creatinine, Ser 0.78 0.4 - 1.5 mg/dL   Calcium 8.9 8.4 - 10.5 mg/dL   Total Protein 6.4 6.0 - 8.3 g/dL   Albumin 3.6 3.5 - 5.2 g/dL   AST 37 0 - 37 U/L   ALT 13 0 - 53 U/L   Alkaline Phosphatase 48 39 - 117 U/L   Total Bilirubin 1.2 0.3 - 1.2 mg/dL   GFR calc non Af Amer >60 >60 mL/min   GFR calc Af Amer  >60 mL/min    >60        The eGFR has been calculated using the MDRD equation. This calculation has not been validated in all clinical situations. eGFR's persistently <60 mL/min signify possible Chronic Kidney Disease.  Troponin I  Result Value Ref Range   Troponin I 0.01        NO INDICATION OF MYOCARDIAL INJURY. 0.00 - 0.06 ng/mL  Lactic acid, plasma  Result Value Ref Range   Lactic Acid, Venous 2.0 0.5 - 2.2 mmol/L  Procalcitonin  Result Value Ref Range   Procalcitonin  ng/mL    <0.10        PCT (Procalcitonin) <= 0.5 ng/mL: Systemic  infection (sepsis) is not  likely. Local bacterial infection is possible.        PCT > 0.5 ng/mL and <= 2 ng/mL: Systemic infection (sepsis) is possible, but other conditions are known to elevate PCT as well.        PCT > 2 ng/mL: Systemic infection (sepsis) is likely, unless other causes are known.        PCT >= 10 ng/mL: Important systemic inflammatory response, almost exclusively due to severe bacterial sepsis or septic shock.  CK total and CKMB  Result Value Ref Range   Total CK 46 7 - 232 U/L   CK, MB 1.3 0.3 - 4.0 ng/mL   Relative Index  0.0 - 2.5    RELATIVE INDEX IS INVALID WHEN CK < 100 U/L         Troponin I  Result Value Ref Range   Troponin I 0.02        NO INDICATION OF MYOCARDIAL INJURY. 0.00 - 0.06 ng/mL  Urinalysis, Routine w reflex microscopic  Result Value Ref Range   Color, Urine YELLOW YELLOW   APPearance CLOUDY (A) CLEAR   Specific Gravity, Urine 1.023 1.005 - 1.030   pH 8.0 5.0 - 8.0   Glucose, UA NEGATIVE NEGATIVE mg/dL   Hgb urine dipstick NEGATIVE NEGATIVE   Bilirubin Urine NEGATIVE NEGATIVE   Ketones, ur 15 (A) NEGATIVE mg/dL   Protein, ur NEGATIVE NEGATIVE mg/dL   Urobilinogen, UA 1.0 0.0 - 1.0 mg/dL   Nitrite NEGATIVE NEGATIVE   Leukocytes, UA  NEGATIVE    NEGATIVE MICROSCOPIC NOT DONE ON URINES WITH NEGATIVE PROTEIN, BLOOD, LEUKOCYTES, NITRITE, OR GLUCOSE <1000 mg/dL.  Strep pneumoniae urinary antigen  Result Value Ref Range   Strep Pneumo Urinary Antigen  NEGATIVE    NEGATIVE        Infection due to S. pneumoniae cannot be absolutely ruled out since the antigen present may be below the detection limit of the test.  TSH  Result Value Ref Range   TSH 1.020 0.350 - 4.500 uIU/mL  Hemoglobin A1c  Result Value Ref Range   Hgb A1c MFr Bld (H) <5.7 %    6.0 (NOTE)                                                                       According to the ADA Clinical Practice Recommendations for 2011, when HbA1c is used as a screening test:   >=6.5%   Diagnostic of  Diabetes Mellitus           (if abnormal result  is confirmed)  5.7-6.4%   Increased risk of developing Diabetes Mellitus  References:Diagnosis and Classification of Diabetes Mellitus,Diabetes ZOXW,9604,54(UJWJX 1):S62-S69 and Standards of Medical Care in         Diabetes - 2011,Diabetes Care,2011,34  (Suppl 1):S11-S61.   Mean Plasma Glucose 126 (H) <117 mg/dL  Lipid panel  Result Value Ref Range   Cholesterol  0 - 200 mg/dL    108        ATP III CLASSIFICATION:  <200     mg/dL   Desirable  200-239  mg/dL   Borderline High  >=240    mg/dL   High  Triglycerides 61 <150 mg/dL   HDL 36 (L) >39 mg/dL   Total CHOL/HDL Ratio 3.0 RATIO   VLDL 12 0 - 40 mg/dL   LDL Cholesterol  0 - 99 mg/dL    60        Total Cholesterol/HDL:CHD Risk Coronary Heart Disease Risk Table                     Men   Women  1/2 Average Risk   3.4   3.3  Average Risk       5.0   4.4  2 X Average Risk   9.6   7.1  3 X Average Risk  23.4   11.0        Use the calculated Patient Ratio above and the CHD Risk Table to determine the patient's CHD Risk.        ATP III CLASSIFICATION (LDL):  <100     mg/dL   Optimal  100-129  mg/dL   Near or Above                    Optimal  130-159  mg/dL   Borderline  160-189  mg/dL   High  >190     mg/dL   Very High  Cardiac panel(cret kin+cktot+mb+tropi)  Result Value Ref Range   Total CK 98 7 - 232 U/L   CK, MB 2.1 0.3 - 4.0 ng/mL   Troponin I 0.02        NO INDICATION OF MYOCARDIAL INJURY. 0.00 - 0.06 ng/mL   Relative Index  0.0 - 2.5    RELATIVE INDEX IS INVALID WHEN CK < 100 U/L         Legionella antigen, urine  Result Value Ref Range   Specimen Description URINE, RANDOM    Special Requests NONE    Legionella Antigen, Urine Negative for Legionella pneumophilia serogroup 1    Report Status 06/27/2010 FINAL   Cardiac panel(cret kin+cktot+mb+tropi)  Result Value Ref Range   Total CK 160 7 - 232 U/L   CK, MB 2.6 0.3 - 4.0 ng/mL   Troponin I 0.02         NO INDICATION OF MYOCARDIAL INJURY. 0.00 - 0.06 ng/mL   Relative Index 1.6 0.0 - 2.5  Vitamin B12  Result Value Ref Range   Vitamin B-12 266 211 - 911 pg/mL  Folate  Result Value Ref Range   Folate  ng/mL    >20.0 (NOTE)  Reference Ranges        Deficient:       0.4 - 3.3 ng/mL        Indeterminate:   3.4 - 5.4 ng/mL        Normal:              > 5.4 ng/mL  RPR  Result Value Ref Range   RPR Ser Ql NON REACTIVE NON REACTIVE  CBC  Result Value Ref Range   WBC 6.0 4.0 - 10.5 K/uL   RBC 4.20 (L) 4.22 - 5.81 MIL/uL   Hemoglobin 13.0 13.0 - 17.0 g/dL   HCT 38.6 (L) 39.0 - 52.0 %   MCV 91.9 78.0 - 100.0 fL   MCH 31.0 26.0 - 34.0 pg   MCHC 33.7 30.0 - 36.0 g/dL   RDW 12.9 11.5 - 15.5 %   Platelets 113 (L) 150 - 400 K/uL  Basic metabolic panel  Result Value Ref Range   Sodium 140 135 -  145 mEq/L   Potassium 4.2 3.5 - 5.1 mEq/L   Chloride 102 96 - 112 mEq/L   CO2 28 19 - 32 mEq/L   Glucose, Bld 109 (H) 70 - 99 mg/dL   BUN 9 6 - 23 mg/dL   Creatinine, Ser 0.78 0.4 - 1.5 mg/dL   Calcium 9.2 8.4 - 10.5 mg/dL   GFR calc non Af Amer >60 >60 mL/min   GFR calc Af Amer  >60 mL/min    >60        The eGFR has been calculated using the MDRD equation. This calculation has not been validated in all clinical situations. eGFR's persistently <60 mL/min signify possible Chronic Kidney Disease.  ANA  Result Value Ref Range   Anit Nuclear Antibody(ANA) NEGATIVE NEGATIVE  Vitamin B1  Result Value Ref Range   Vitamin B1 (Thiamine) 38 9 - 44 nmol/L  I-STAT, chem 8  Result Value Ref Range   Sodium 134 (L) 135 - 145 mEq/L   Potassium 5.3 (H) 3.5 - 5.1 mEq/L   Chloride 103 96 - 112 mEq/L   BUN 22 6 - 23 mg/dL   Creatinine, Ser 0.9 0.4 - 1.5 mg/dL   Glucose, Bld 113 (H) 70 - 99 mg/dL   Calcium, Ion 0.91 (L) 1.12 - 1.32 mmol/L   TCO2 24 0 - 100 mmol/L   Hemoglobin 14.3 13.0 - 17.0 g/dL   HCT 42.0 39.0 - 52.0 %      Assessment & Plan:   Problem List Items Addressed This Visit    None     Visit Diagnoses    Weakness generalized    -  Primary    Continue physical therapy. New order given today. Continue to monitor.     Decreased breath sounds        Work on deep breathing, likely atelectasis, would likely benefit from incentive spirometry.        Follow up plan: Return As scheduled, for Physical with MAC.

## 2014-11-28 ENCOUNTER — Ambulatory Visit (INDEPENDENT_AMBULATORY_CARE_PROVIDER_SITE_OTHER): Payer: Medicare Other | Admitting: Cardiology

## 2014-11-28 ENCOUNTER — Encounter: Payer: Self-pay | Admitting: Cardiology

## 2014-11-28 VITALS — BP 132/80 | HR 79 | Ht 71.5 in | Wt 203.6 lb

## 2014-11-28 DIAGNOSIS — R55 Syncope and collapse: Secondary | ICD-10-CM | POA: Diagnosis not present

## 2014-11-28 DIAGNOSIS — R609 Edema, unspecified: Secondary | ICD-10-CM

## 2014-11-28 DIAGNOSIS — R001 Bradycardia, unspecified: Secondary | ICD-10-CM | POA: Diagnosis not present

## 2014-11-28 DIAGNOSIS — I2510 Atherosclerotic heart disease of native coronary artery without angina pectoris: Secondary | ICD-10-CM | POA: Diagnosis not present

## 2014-11-28 NOTE — Patient Instructions (Signed)
Medication Instructions:  Same-no changes  Labwork: None  Testing/Procedures: None  Follow-Up: Your physician wants you to follow-up in: 1 year with Dr Elease Hashimoto in our [place] office. You will receive a reminder letter in the mail two months in advance. If you don't receive a letter, please call our office to schedule the follow-up appointment.

## 2014-11-28 NOTE — Progress Notes (Signed)
Cardiology Office Note   Date:  11/28/2014   ID:  [name], DOB [birth year], MRN [id number]  PCP:  [name], MD  Cardiologist:  [name], MD   Chief Complaint  Patient presents with  . Appointment    Follow-up coronary disease      History of Present Illness: [name] [name] is a 81 y.o. male who presents today to follow-up coronary artery disease. I have followed him for many many years. I saw him last August, 2015. He is here today as always with his daughter and his wife. His ability to ambulate is now very limited due to weakness in his legs. He has dependent peripheral edema related to venous insufficiency. He has been assessed by vascular surgery. He is not able to wear his support hose. He does not have total body volume overload. He is not having shortness of breath or chest pain.    Past Medical History  Diagnosis Date  . CAD (coronary artery disease)     nuclear 01/2008, no ischemia, EF 60%, distal anterior scar  . Hyperlipidemia   . Syncope     .remote..no etiology  /  syncope 11/2008.. from acute leg pain  . Venous insufficiency     support hose  . Carotid artery disease     doppler 05/2009,  0-39% bilateral / doppler 06/2010 OK  . Elevated CPK     11/23/08.Marland KitchenMarland Kitchenprobably from debris from popliteal aneurysm  . Popliteal aneurysm     .repair..11/25/2008  Dr. Darrick Penna  . Speech problem     Neurologic event with brief infusion and speech difficulty.... January, 2011.... being assessed by neurology  February, 2011  . Hx of CABG     1998  . Ejection fraction     EF 45%, echo,05/2009,apical hypo,  ?? posterior and lateral hypo  . Prominent abdominal aortic pulsation     doppler normal  . Intracranial hemorrhage     Two small subependymal bleeds 06/2010,,ASA held  . Bradycardia     06/2010  . Edema     Ankle edema August, 2012  . TIA (transient ischemic attack)     Brief episode of confusion and speech difficulty, January, 2012  /  recurrent confusion speech  difficulty September, 2012  . PAD (peripheral artery disease)     Above the knee to below the knee popliteal bypass,, right  //    Doppler,  July, 2013, patent  . Fall at home July 2015    Past Surgical History  Procedure Laterality Date  . Coronary artery bypass graft  1998  . Popliteal bypass  with reversed greater saphenous vein from right leg.      Patient Active Problem List   Diagnosis Date Noted  . Popliteal artery aneurysm 10/13/2013  . Discoloration of skin of foot-Left 10/13/2013  . PAD (peripheral artery disease)   . TIA (transient ischemic attack)   . Edema   . Hyperlipidemia   . Syncope   . CAD (coronary artery disease)   . Venous insufficiency   . Carotid artery disease   . Elevated CPK   . Popliteal aneurysm   . Speech problem   . Hx of CABG   . Ejection fraction   . Prominent abdominal aortic pulsation   . Intracranial hemorrhage   . Bradycardia       Current Outpatient Prescriptions  Medication Sig Dispense Refill  . aspirin 81 MG tablet Take 81 mg by mouth daily.      Marland Kitchen  Calcium Carbonate (CALCIUM 500 PO) 2 tabs po qd     . Multiple Vitamin (MULTIVITAMIN) capsule Take 1 capsule by mouth daily.       No current facility-administered medications for this visit.    Allergies:   Penicillins and Sulfonamide derivatives    Social History:  The patient  reports that he quit smoking about 68 years ago. His smoking use included Cigarettes. He quit after 20 years of use. He has never used smokeless tobacco. He reports that he does not drink alcohol or use illicit drugs.   Family History:  The patient's family history includes Diabetes in his mother and sister; Stroke in his mother.    ROS:  Please see the history of present illness.   Patient denies fever, chills, headache, sweats, rash, change in vision, change in hearing, chest pain, cough, nausea or vomiting, urinary symptoms. All other systems are reviewed and are negative.     PHYSICAL EXAM: VS:  BP  132/80 mmHg  Pulse 79  Ht 5' 11.5" (1.816 m)  Wt 203 lb 9.6 oz (92.352 kg)  BMI 28.00 kg/m2 , Patient is oriented to person time and place. Affect is normal. He is in a wheelchair. Head is atraumatic. Sclera and conjunctiva are normal. He is here with his daughter and his wife. There is no jugular venous distention. Lungs are clear. Respiratory effort is nonlabored. Cardiac exam reveals an S1 and S2. There is a very soft murmur. The abdomen is soft. He has dependent peripheral edema. However there are no skin rashes. the edema is not tense. There is no weeping.   EKG:   EKG is done today and reviewed by me. There is no change from the past. There is decreased anterior R-wave progression.   Recent Labs: No results found for requested labs within last 365 days.    Lipid Panel    Component Value Date/Time   CHOL  06/27/2010 0540    108        ATP III CLASSIFICATION:  <200     mg/dL   Desirable  284-132  mg/dL   Borderline High  >=440    mg/dL   High          TRIG 61 06/27/2010 0540   HDL 36* 06/27/2010 0540   CHOLHDL 3.0 06/27/2010 0540   VLDL 12 06/27/2010 0540   LDLCALC  06/27/2010 0540    60        Total Cholesterol/HDL:CHD Risk Coronary Heart Disease Risk Table                     Men   Women  1/2 Average Risk   3.4   3.3  Average Risk       5.0   4.4  2 X Average Risk   9.6   7.1  3 X Average Risk  23.4   11.0        Use the calculated Patient Ratio above and the CHD Risk Table to determine the patient's CHD Risk.        ATP III CLASSIFICATION (LDL):  <100     mg/dL   Optimal  102-725  mg/dL   Near or Above                    Optimal  130-159  mg/dL   Borderline  366-440  mg/dL   High  >347     mg/dL   Very High  Wt Readings from Last 3 Encounters:  11/28/14 203 lb 9.6 oz (92.352 kg)  10/20/14 206 lb (93.441 kg)  10/21/13 206 lb 3.2 oz (93.532 kg)      Current medicines are reviewed  The patient and his family understand his  medications.     ASSESSMENT AND PLAN:

## 2014-11-28 NOTE — Assessment & Plan Note (Signed)
In the remote past the patient had syncopal episode. He had a recurrent episode in 2010 that was related to acute pain. He has not had any recent syncope. Overall he is stable.

## 2014-11-28 NOTE — Assessment & Plan Note (Signed)
The patient's last coronary study was a nuclear study in 2009. Ejection fraction was 60% with distal anterior scar. There was no ischemia. He is not having any significant symptoms. No further workup.

## 2014-11-28 NOTE — Assessment & Plan Note (Signed)
He has lower extremity edema that is related to vascular insufficiency. It is not tense. The family is not able to get his support hose on. Vascular surgery had considered using wraps. However the family was concerned that while trying to do this they might injure his skin and cause more problems. He is stable with this edema. No further workup.

## 2014-12-05 ENCOUNTER — Ambulatory Visit: Payer: Medicare Other | Attending: Family Medicine | Admitting: Physical Therapy

## 2014-12-05 DIAGNOSIS — R531 Weakness: Secondary | ICD-10-CM | POA: Diagnosis present

## 2014-12-05 NOTE — Therapy (Signed)
[facility] [address] [place], [address] Phone: [phone]   Fax:  [phone]  Physical Therapy Evaluation  Patient Details  Name: [name] [name] MRN: [id number] Date of Birth: 01/18/1922 Referring Provider:  Dorcas Carrow, DO  Encounter Date: 12/05/2014      PT End of Session - 12/05/14 1737    Visit Number 1   Number of Visits 17   Date for PT Re-Evaluation 01/30/15   PT Start Time 1630   PT Stop Time 1720   PT Time Calculation (min) 50 min   Activity Tolerance Patient tolerated treatment well;No increased pain   Behavior During Therapy [facility] for tasks assessed/performed      Past Medical History  Diagnosis Date  . CAD (coronary artery disease)     nuclear 01/2008, no ischemia, EF 60%, distal anterior scar  . Hyperlipidemia   . Syncope     .remote..no etiology  /  syncope 11/2008.. from acute leg pain  . Venous insufficiency     support hose  . Carotid artery disease     doppler 05/2009,  0-39% bilateral / doppler 06/2010 OK  . Elevated CPK     11/23/08.Marland KitchenMarland Kitchenprobably from debris from popliteal aneurysm  . Popliteal aneurysm     .repair..11/25/2008  Dr. Darrick Penna  . Speech problem     Neurologic event with brief infusion and speech difficulty.... January, 2011.... being assessed by neurology  February, 2011  . Hx of CABG     1998  . Ejection fraction     EF 45%, echo,05/2009,apical hypo,  ?? posterior and lateral hypo  . Prominent abdominal aortic pulsation     doppler normal  . Intracranial hemorrhage     Two small subependymal bleeds 06/2010,,ASA held  . Bradycardia     06/2010  . Edema     Ankle edema August, 2012  . TIA (transient ischemic attack)     Brief episode of confusion and speech difficulty, January, 2012  /  recurrent confusion speech difficulty September, 2012  . PAD (peripheral artery disease)     Above the knee to below the knee popliteal bypass,, right  //    Doppler,  July,  2013, patent  . Fall at home July 2015    Past Surgical History  Procedure Laterality Date  . Coronary artery bypass graft  1998  . Popliteal bypass  with reversed greater saphenous vein from right leg.      There were no vitals filed for this visit.  Visit Diagnosis:  Weakness - Plan: PT plan of care cert/re-cert      Subjective Assessment - 12/05/14 1722    Subjective Patient reports that he has gotten weak over the last few years. He has had HHPT since April and was just discharged last month.   Patient is accompained by: Family member  wife and daughter   Limitations Standing;Walking;House hold activities   How long can you walk comfortably? 2 min   Patient Stated Goals "To be able to walk again."   Currently in Pain? No/denies   Multiple Pain Sites No            [facility] PT Assessment - 12/05/14 0001    Assessment   Medical Diagnosis weakness   Onset Date/Surgical Date 03/05/11   Next MD Visit 12/06/2014   Prior Therapy HHPT   Precautions   Precautions Fall   Balance Screen   Has the patient fallen in the  past 6 months Yes   How many times? 1   Has the patient had a decrease in activity level because of a fear of falling?  Yes   Is the patient reluctant to leave their home because of a fear of falling?  Yes   Home Environment   Living Environment Private residence   Living Arrangements Spouse/significant other   Type of Home House   Home Access Ramped entrance   Home Layout One level   Home Equipment Walker - 2 wheels;Cane - single point;Shower seat;Grab bars - tub/shower;Wheelchair - manual   Prior Function   Level of Independence Needs assistance with ADLs;Independent with transfers;Needs assistance with gait;Needs assistance with transfers   Level of Independence - [place] Moderate   Vocation Retired   IT consultant   Overall Cognitive Status Within Functional Limits for tasks assessed   Attention Focused   Observation/Other Assessments   Lower Extremity  Functional Scale  19/80   Observation/Other Assessments-Edema    Edema --  patient has bilateral swelling of LEs, unable to wear shoes.   Sensation   Light Touch Appears Intact   Posture/Postural Control   Posture Comments patient has flexed posture/stooped   ROM / Strength   AROM / PROM / Strength AROM;Strength   AROM   Overall AROM  Within functional limits for tasks performed   Strength   Overall Strength Within functional limits for tasks performed   Flexibility   Soft Tissue Assessment /Muscle Length --  patient has bilateral knee and hip tightness   Transfers   Transfers Sit to Stand;Stand to Sit   Sit to Stand 6: Modified independent (Device/Increase time)   Stand to Sit 6: Modified independent (Device/Increase time)   Ambulation/Gait   Ambulation Distance (Feet) 70 Feet   Assistive device Rolling walker   Gait Pattern Step-to pattern   Ambulation Surface Level   Functional Gait  Assessment   Gait assessed  No       Treamtent to include: Nu step level 1 x 8 min Gait training with rw x 70 feet. 30' at one time prior to needing seated rest break.           PT Education - 12/05/14 1736    Education provided Yes   Education Details POC, Nu step   Person(s) Educated Patient;Spouse;Child(ren)   Methods Explanation   Comprehension Verbalized understanding;Returned demonstration;Verbal cues required             PT Long Term Goals - 12/05/14 1741    PT LONG TERM GOAL #1   Title Patient will demonstrate improved TUG score of  < 1 min  to decrease fall risk   Baseline 1 min 19 sec   Time 8   Period Weeks   Status New   PT LONG TERM GOAL #2   Title Patient will be able to ambulate with LRAD > 5 minutes without rest for improved ability to ambulate around his house.    Baseline 1-2 min   Time 8   Period Weeks   Status New   PT LONG TERM GOAL #3   Title Patient will demonstrate 5x STS in less than 1 min for improved strength and mobility at home.     Time 8   Period Weeks   Status New   PT LONG TERM GOAL #4   Title Patient will improve LEFS score to > 25/80 demonstrating improved function.   Baseline 19/80   Time 8   Period Weeks  Status New               Plan - 2014/12/07 1738    Clinical Impression Statement Patient is a 81 year old male who reports general weakness, inability to walk very far using walker, he is mainly confined to W/C at this time. His daughter reports he has good and bad days and when he has a good day he can walk in the home fairly well. He lives with his wife. Daughter are there to assist.  Patient demonstrates increased fall risk with difficulty rising from chair and difficulty walking.    Pt will benefit from skilled therapeutic intervention in order to improve on the following deficits Decreased activity tolerance;Decreased balance;Impaired flexibility;Decreased mobility   Rehab Potential Fair   PT Frequency 2x / week   PT Duration 8 weeks   PT Treatment/Interventions ADLs/Self Care Home Management;Gait training;Therapeutic activities;Therapeutic exercise;Balance training;Wheelchair mobility training;Patient/family education;Passive range of motion   PT Next Visit Plan Strengthening, gait, stretching of LEs. balance   Consulted and Agree with Plan of Care Patient;Family member/caregiver          G-Codes - 07-Dec-2014 1744    Functional Assessment Tool Used LEFS, TUG   Functional Limitation Mobility: Walking and moving around   Mobility: Walking and Moving Around Current Status [phone]) At least 60 percent but less than 80 percent impaired, limited or restricted   Mobility: Walking and Moving Around Goal Status [id number]) At least 40 percent but less than 60 percent impaired, limited or restricted       Problem List Patient Active Problem List   Diagnosis Date Noted  . 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 Discoloration of skin of foot-Left 10/13/2013  . PAD (peripheral artery disease) (HCC)    . TIA (transient ischemic attack)   . Edema   . Hyperlipidemia   . Syncope   . CAD (coronary artery disease)   . Venous insufficiency   . Carotid artery disease (HCC)   . Elevated CPK   . Popliteal aneurysm (HCC)   . Speech problem   . Hx of CABG   . Ejection fraction   . Prominent abdominal aortic pulsation   . Intracranial hemorrhage (HCC)   . Bradycardia     Jaquil Todt, PT, MPT, GCS 2014-12-07, 5:48 PM  [place] [facility] [address], [place], [address] Phone: [phone]   Fax:  [phone]

## 2014-12-07 ENCOUNTER — Ambulatory Visit (INDEPENDENT_AMBULATORY_CARE_PROVIDER_SITE_OTHER): Payer: Medicare Other | Admitting: Family Medicine

## 2014-12-07 ENCOUNTER — Encounter: Payer: Self-pay | Admitting: Family Medicine

## 2014-12-07 VITALS — BP 115/71 | HR 55 | Temp 97.0°F | Wt 205.0 lb

## 2014-12-07 DIAGNOSIS — I739 Peripheral vascular disease, unspecified: Secondary | ICD-10-CM

## 2014-12-07 DIAGNOSIS — R609 Edema, unspecified: Secondary | ICD-10-CM | POA: Diagnosis not present

## 2014-12-07 DIAGNOSIS — Z23 Encounter for immunization: Secondary | ICD-10-CM | POA: Diagnosis not present

## 2014-12-07 NOTE — Progress Notes (Signed)
   BP 115/71 mmHg  Pulse 55  Temp(Src) 97 F (36.1 C)  Ht   Wt 205 lb (92.987 kg)  SpO2 99%   Subjective:    Patient ID: [name] [name], male    DOB: [birth year], 81 y.o.   MRN: [id number]  HPI: [name] [name] is a 81 y.o. male  Chief Complaint  Patient presents with  . 6 month f/u   Patient with multiple medical conditions followed by cardiology was told dependent edema not heart related secondary to peripheral artery disease. Reviewed records patient had extensive PAD workup including surgery and multiple attempts at controlling dependent edema. These of been unsuccessful and after discussion with daughter who provides most of the history and is a caregiver Doing skin care and elevation. Patient on no other medications and is doing well.  Relevant past medical, surgical, family and social history reviewed and updated as indicated. Interim medical history since our last visit reviewed. Allergies and medications reviewed and updated.  Review of Systems  Per HPI unless specifically indicated above     Objective:    BP 115/71 mmHg  Pulse 55  Temp(Src) 97 F (36.1 C)  Ht   Wt 205 lb (92.987 kg)  SpO2 99%  Wt Readings from Last 3 Encounters:  12/07/14 205 lb (92.987 kg)  11/28/14 203 lb 9.6 oz (92.352 kg)  10/20/14 206 lb (93.441 kg)    Physical Exam      Assessment & Plan:   Problem List Items Addressed This Visit      Other   Edema    Discussed skin care Discuss elevation Modest compression Exercise as tolerated       Other Visit Diagnoses    Immunization due    -  Primary    Relevant Orders    Flu Vaccine QUAD 36+ mos PF IM (Fluarix & Fluzone Quad PF) (Completed)       Patient becoming more feeble daughter is a good caregiver discuss caregiving issues Discussed community aids eldercare etc. Follow up plan: Return in about 6 months (around 06/07/2015), or if symptoms worsen or fail to improve, for Physical Exam.

## 2014-12-07 NOTE — Assessment & Plan Note (Signed)
Discussed skin care Discuss elevation Modest compression Exercise as tolerated

## 2014-12-07 NOTE — Assessment & Plan Note (Signed)
Followed by vascular 

## 2014-12-08 ENCOUNTER — Ambulatory Visit: Payer: Medicare Other | Admitting: Physical Therapy

## 2014-12-08 DIAGNOSIS — R531 Weakness: Secondary | ICD-10-CM | POA: Diagnosis not present

## 2014-12-08 NOTE — Therapy (Signed)
[name] [address], [place], [address] Phone: [phone]   Fax:  [phone]  Physical Therapy Treatment  Patient Details  Name: [name] [name] MRN: [id number] Date of Birth: [birth year] Referring Provider:  Dorcas Carrow, DO  Encounter Date: 12/08/2014      PT End of Session - 12/08/14 1553    Visit Number 2   Number of Visits 17   Date for PT Re-Evaluation 01/30/15   PT Start Time 1515   PT Stop Time 1553   PT Time Calculation (min) 38 min   Activity Tolerance Patient tolerated treatment well;No increased pain;Patient limited by fatigue   Behavior During Therapy [name] for tasks assessed/performed      Past Medical History  Diagnosis Date  . CAD (coronary artery disease)     nuclear 01/2008, no ischemia, EF 60%, distal anterior scar  . Hyperlipidemia   . Syncope     .remote..no etiology  /  syncope 11/2008.. from acute leg pain  . Venous insufficiency     support hose  . Carotid artery disease ([name] [name])     doppler 05/2009,  0-39% bilateral / doppler 06/2010 OK  . Elevated CPK     11/23/08.Marland KitchenMarland Kitchenprobably from debris from popliteal aneurysm  . Popliteal aneurysm ([name] [name])     .repair..11/25/2008  Dr. Darrick [name]  . Speech problem     Neurologic event with brief infusion and speech difficulty.... January, 2011.... being assessed by neurology  February, 2011  . Hx of CABG     1998  . Ejection fraction     EF 45%, echo,05/2009,apical hypo,  ?? posterior and lateral hypo  . Prominent abdominal aortic pulsation     doppler normal  . Intracranial hemorrhage ([name] [name])     Two small subependymal bleeds 06/2010,,ASA held  . Bradycardia     06/2010  . Edema     Ankle edema August, 2012  . TIA (transient ischemic attack)     Brief episode of confusion and speech difficulty, January, 2012  /  recurrent confusion speech difficulty September, 2012  . PAD (peripheral artery disease) ([name] [name])     Above the knee to below the knee  popliteal bypass,, right  //    Doppler,  July, 2013, patent  . Fall at home July 2015    Past Surgical History  Procedure Laterality Date  . Coronary artery bypass graft  1998  . Popliteal bypass  with reversed greater saphenous vein from right leg.      There were no vitals filed for this visit.  Visit Diagnosis:  Weakness      Subjective Assessment - 12/08/14 1552    Subjective Patient reports he is feeling okay.    Patient is accompained by: Family member   Limitations Standing;Walking;House hold activities   Patient Stated Goals "To be able to walk again."   Multiple Pain Sites No          Treatment:  Nu step level 2 x 6 min for 347 steps. For warm up and strengthening. LAQ 2x10, seated hip flexion x10 each Sit to stand x5 Cues for hand placement and safety. Gait x 50' with rw, cues for upright posture and safety.  Side stepping at bar 5'x4 reps with cues for upright posture and to keep feet forward. B extension stretching of knees x 2 min        PT Education - 12/08/14 1553    Education provided Yes  Education Details Nu step, exercises, safety   Person(s) Educated Patient   Methods Explanation;Demonstration   Comprehension Verbalized understanding;Returned demonstration             PT Long Term Goals - 12/05/14 1741    PT LONG TERM GOAL #1   Title Patient will demonstrate improved TUG score of  < 1 min  to decrease fall risk   Baseline 1 min 19 sec   Time 8   Period Weeks   Status New   PT LONG TERM GOAL #2   Title Patient will be able to ambulate with LRAD > 5 minutes without rest for improved ability to ambulate around his house.    Baseline 1-2 min   Time 8   Period Weeks   Status New   PT LONG TERM GOAL #3   Title Patient will demonstrate 5x STS in less than 1 min for improved strength and mobility at home.    Time 8   Period Weeks   Status New   PT LONG TERM GOAL #4   Title Patient will improve LEFS score to > 25/80 demonstrating  improved function.   Baseline 19/80   Time 8   Period Weeks   Status New               Plan - 12/08/14 1554    Clinical Impression Statement Patient has low tolerance for activity, however per daughter he is reluctant to tell us so. Limited length of session to prevent fatigue and pain.    Pt will benefit from skilled therapeutic intervention in order to improve on the following deficits Decreased activity tolerance;Decreased balance;Impaired flexibility;Decreased mobility   Rehab Potential Fair   PT Frequency 2x / week   PT Duration 8 weeks   PT Treatment/Interventions ADLs/Self Care Home Management;Gait training;Therapeutic activities;Therapeutic exercise;Balance training;Wheelchair mobility training;Patient/family education;Passive range of motion   PT Next Visit Plan Strengthening, gait, stretching of LEs. balance   Consulted and Agree with Plan of Care Patient        Problem List Patient Active Problem List   Diagnosis Date Noted  . Popliteal artery aneurysm ([name] [name]) 10/13/2013  . Discoloration of skin of foot-Left 10/13/2013  . PAD (peripheral artery disease) ([name] [name])   . TIA (transient ischemic attack)   . Edema   . Hyperlipidemia   . Syncope   . CAD (coronary artery disease)   . Venous insufficiency   . Carotid artery disease ([name] [name])   . Elevated CPK   . Popliteal aneurysm ([name] [name])   . Speech problem   . Hx of CABG   . Ejection fraction   . Prominent abdominal aortic pulsation   . Intracranial hemorrhage ([name] [name])   . Bradycardia     [name] [name], PT, MPT, GCS 12/08/2014, 3:56 PM  [name] [name] [address], [place], [address] Phone: [phone]   Fax:  [phone]

## 2014-12-13 ENCOUNTER — Ambulatory Visit: Payer: Medicare Other

## 2014-12-13 DIAGNOSIS — R531 Weakness: Secondary | ICD-10-CM

## 2014-12-13 NOTE — Therapy (Signed)
[place] [facility] [address], [place], [address] Phone: [phone]   Fax:  [phone]  Physical Therapy Treatment  Patient Details  Name: [name] [name] MRN: [id number] Date of Birth: May 14, 1921 Referring Provider:  Dorcas Carrow, DO  Encounter Date: 12/13/2014      PT End of Session - 12/13/14 1638    Visit Number 3   Number of Visits 17   Date for PT Re-Evaluation 01/30/15   PT Start Time 1638   PT Stop Time 1729   PT Time Calculation (min) 51 min   Activity Tolerance Patient tolerated treatment well;No increased pain;Patient limited by fatigue   Behavior During Therapy [facility] for tasks assessed/performed      Past Medical History  Diagnosis Date  . CAD (coronary artery disease)     nuclear 01/2008, no ischemia, EF 60%, distal anterior scar  . Hyperlipidemia   . Syncope     .remote..no etiology  /  syncope 11/2008.. from acute leg pain  . Venous insufficiency     support hose  . Carotid artery disease (HCC)     doppler 05/2009,  0-39% bilateral / doppler 06/2010 OK  . Elevated CPK     11/23/08.Marland KitchenMarland Kitchenprobably from debris from popliteal aneurysm  . Popliteal aneurysm (HCC)     .repair..11/25/2008  Dr. Darrick Penna  . Speech problem     Neurologic event with brief infusion and speech difficulty.... January, 2011.... being assessed by neurology  February, 2011  . Hx of CABG     1998  . Ejection fraction     EF 45%, echo,05/2009,apical hypo,  ?? posterior and lateral hypo  . Prominent abdominal aortic pulsation     doppler normal  . Intracranial hemorrhage (HCC)     Two small subependymal bleeds 06/2010,,ASA held  . Bradycardia     06/2010  . Edema     Ankle edema August, 2012  . TIA (transient ischemic attack)     Brief episode of confusion and speech difficulty, January, 2012  /  recurrent confusion speech difficulty September, 2012  . PAD (peripheral artery disease) (HCC)     Above the knee to below the  knee popliteal bypass,, right  //    Doppler,  July, 2013, patent  . Fall at home July 2015    Past Surgical History  Procedure Laterality Date  . Coronary artery bypass graft  1998  . Popliteal bypass  with reversed greater saphenous vein from right leg.      There were no vitals filed for this visit.  Visit Diagnosis:  Weakness      Subjective Assessment - 12/13/14 1639    Subjective Pt states no pain or discomfort. Has not walked without a walker since 5-6 monts ago.    Patient is accompained by: Family member   Limitations Standing;Walking;House hold activities   Patient Stated Goals "To be able to walk again."   Currently in Pain? No/denies   Multiple Pain Sites No         Pt arrived to therapy with a cut on his R elbow. Band aid placed to cover wound.    Treatment:  Nu step level 1 x 6 min (seat 8) for warm up and strengthening. Side stepping at bar 5'x2 to the R and to the L, Gait x 20 ft with rw CGA +1 , cues for increasing hip and knee flexion to promote foot clearance and cues to widen stance for increased  base of support.  LAQ 2x10 with 5 second holds each LE,  Sit <> stand transfers throughout session min A to CGA with cues for hand placement, and placing center of gravity over his base of support.  Seated manually resisted leg press 10x total each LE,  Bilateral knee extension stretching of knees x 2 min Stand pivot transfer from wc to front passenger seat 1x with mod A, and min cues for hand and foot placement.   Gait belt and rw utilized.   Rest breaks provided as to not overwork pt.   Improved exercise technique, movement at target joints, use of target muscles after mod verbal, visual, tactile cues.     Pt tolerated session well without complain of pain. Pt also demonstrates fatigue with gait with decreased ability to support himself with bilateral LE with increased time walking.                             PT Education -  12/13/14 1654    Education provided Yes   Education Details ther-ex   [employer]) Educated Patient   Methods Explanation;Demonstration;Tactile cues;Verbal cues   Comprehension Verbalized understanding;Returned demonstration             PT Long Term Goals - 12/05/14 1741    PT LONG TERM GOAL #1   Title Patient will demonstrate improved TUG score of  < 1 min  to decrease fall risk   Baseline 1 min 19 sec   Time 8   Period Weeks   Status New   PT LONG TERM GOAL #2   Title Patient will be able to ambulate with LRAD > 5 minutes without rest for improved ability to ambulate around his house.    Baseline 1-2 min   Time 8   Period Weeks   Status New   PT LONG TERM GOAL #3   Title Patient will demonstrate 5x STS in less than 1 min for improved strength and mobility at home.    Time 8   Period Weeks   Status New   PT LONG TERM GOAL #4   Title Patient will improve LEFS score to > 25/80 demonstrating improved function.   Baseline 19/80   Time 8   Period Weeks   Status New               Plan - 12/13/14 1654    Clinical Impression Statement Pt tolerated session well without complain of pain. Pt also demonstrates fatigue with gait with decreased ability to support himself with bilateral LE with increased time walking.    Pt will benefit from skilled therapeutic intervention in order to improve on the following deficits Decreased activity tolerance;Decreased balance;Impaired flexibility;Decreased mobility   Rehab Potential Fair   PT Frequency 2x / week   PT Duration 8 weeks   PT Treatment/Interventions ADLs/Self Care Home Management;Gait training;Therapeutic activities;Therapeutic exercise;Balance training;Wheelchair mobility training;Patient/family education;Passive range of motion   PT Next Visit Plan Strengthening, gait, stretching of LEs. balance   Consulted and Agree with Plan of Care Patient        Problem List Patient Active Problem List   Diagnosis Date Noted  .  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 Discoloration of skin of foot-Left 10/13/2013  . PAD (peripheral artery disease) (HCC)   . TIA (transient ischemic attack)   . Edema   . Hyperlipidemia   . Syncope   . CAD (coronary artery disease)   .  Venous insufficiency   . Carotid artery disease (HCC)   . Elevated CPK   . Popliteal aneurysm (HCC)   . Speech problem   . Hx of CABG   . Ejection fraction   . Prominent abdominal aortic pulsation   . Intracranial hemorrhage (HCC)   . Bradycardia     Loralyn Freshwater PT, DPT   12/13/2014, 5:48 PM  [place] [facility] [address] [place], [address] Phone: [phone]   Fax:  [phone]

## 2014-12-15 ENCOUNTER — Ambulatory Visit: Payer: Medicare Other

## 2014-12-15 DIAGNOSIS — R531 Weakness: Secondary | ICD-10-CM | POA: Diagnosis not present

## 2014-12-15 NOTE — Therapy (Signed)
[place] [facility] [address] [place], [address] Phone: [phone]   Fax:  [phone]  Physical Therapy Treatment  Patient Details  Name: [name] [name] MRN: [id number] Date of Birth: [birth year] Referring Provider:  Dorcas Carrow, DO  Encounter Date: 12/15/2014      PT End of Session - 12/15/14 1653    Visit Number 4   Number of Visits 17   Date for PT Re-Evaluation 01/30/15   PT Start Time 1653   PT Stop Time 1739   PT Time Calculation (min) 46 min   Activity Tolerance Patient tolerated treatment well;No increased pain;Patient limited by fatigue   Behavior During Therapy [facility] for tasks assessed/performed      Past Medical History  Diagnosis Date  . CAD (coronary artery disease)     nuclear 01/2008, no ischemia, EF 60%, distal anterior scar  . Hyperlipidemia   . Syncope     .remote..no etiology  /  syncope 11/2008.. from acute leg pain  . Venous insufficiency     support hose  . Carotid artery disease (HCC)     doppler 05/2009,  0-39% bilateral / doppler 06/2010 OK  . Elevated CPK     11/23/08.Marland KitchenMarland Kitchenprobably from debris from popliteal aneurysm  . Popliteal aneurysm (HCC)     .repair..11/25/2008  Dr. Darrick Penna  . Speech problem     Neurologic event with brief infusion and speech difficulty.... January, 2011.... being assessed by neurology  February, 2011  . Hx of CABG     1998  . Ejection fraction     EF 45%, echo,05/2009,apical hypo,  ?? posterior and lateral hypo  . Prominent abdominal aortic pulsation     doppler normal  . Intracranial hemorrhage (HCC)     Two small subependymal bleeds 06/2010,,ASA held  . Bradycardia     06/2010  . Edema     Ankle edema August, 2012  . TIA (transient ischemic attack)     Brief episode of confusion and speech difficulty, January, 2012  /  recurrent confusion speech difficulty September, 2012  . PAD (peripheral artery disease) (HCC)     Above the knee to below the  knee popliteal bypass,, right  //    Doppler,  July, 2013, patent  . Fall at home July 2015    Past Surgical History  Procedure Laterality Date  . Coronary artery bypass graft  1998  . Popliteal bypass  with reversed greater saphenous vein from right leg.      There were no vitals filed for this visit.  Visit Diagnosis:  Weakness      Subjective Assessment - 12/15/14 1659    Subjective Pt state having a good day. No pain or discomfort as well. Was sore last session but fine now.    Patient is accompained by: Family member   Limitations Standing;Walking;House hold activities   Patient Stated Goals "To be able to walk again."   Currently in Pain? No/denies   Multiple Pain Sites No      There-ex:  Directed patient with seated LAQ 10x2 with 5 second holds,  Seated manually resisted leg press 10x2 each LE Gait 32 ft, 34 ft and (after stretches) 42 ft with rw (adjusted pt personal rw to proper height) CGA to min A + 1 (second person pushing wc), cues for increasing hip and knee flexion, step length, staying closer to rw, and for foot clearance Seated manual gastroc stretch by PT 1 min  each LE,  Seated bilateral knee extension stretches x 2 min (reviewed and given as part of HEP 2 min x 3; pt demonstrated and verbalized understanding) Sit <> stand transfers from wc throughout session min to mod assist with cues for bringing center of gravity over base of support, use of hands, pushing with bilateral LE to stand.    Gait belt and rw utilized.   Rest breaks provided as to not overwork pt.   Improved exercise technique, movement at target joints, use of target muscles after mod verbal, visual, tactile cues.    Pt able to ambulate multiple times without LE giving way today. Pt also demonstrates decreased bilateral heel strike and foot flat with heels elevated about 50% of the time during gait therefore bilateral gastroc muscle stretches performed.                            PT Education - 12/15/14 1702    Education provided Yes   Education Details ther-ex   [employer]) Educated Patient   Methods Explanation;Demonstration;Verbal cues;Tactile cues   Comprehension Verbalized understanding;Returned demonstration             PT Long Term Goals - 12/05/14 1741    PT LONG TERM GOAL #1   Title Patient will demonstrate improved TUG score of  < 1 min  to decrease fall risk   Baseline 1 min 19 sec   Time 8   Period Weeks   Status New   PT LONG TERM GOAL #2   Title Patient will be able to ambulate with LRAD > 5 minutes without rest for improved ability to ambulate around his house.    Baseline 1-2 min   Time 8   Period Weeks   Status New   PT LONG TERM GOAL #3   Title Patient will demonstrate 5x STS in less than 1 min for improved strength and mobility at home.    Time 8   Period Weeks   Status New   PT LONG TERM GOAL #4   Title Patient will improve LEFS score to > 25/80 demonstrating improved function.   Baseline 19/80   Time 8   Period Weeks   Status New               Plan - 12/15/14 1703    Clinical Impression Statement Pt able to ambulate multiple times without LE giving way today. Pt also demonstrates decreased bilateral heel strike and foot flat with heels elevated about 50% of the time during gait therefore bilateral gastroc muscle stretches performed   Pt will benefit from skilled therapeutic intervention in order to improve on the following deficits Decreased activity tolerance;Decreased balance;Impaired flexibility;Decreased mobility   Rehab Potential Fair   PT Frequency 2x / week   PT Duration 8 weeks   PT Treatment/Interventions ADLs/Self Care Home Management;Gait training;Therapeutic activities;Therapeutic exercise;Balance training;Wheelchair mobility training;Patient/family education;Passive range of motion   PT Next Visit Plan Strengthening, gait, stretching of LEs. balance    Consulted and Agree with Plan of Care Patient        Problem List Patient Active Problem List   Diagnosis Date Noted  . 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 Discoloration of skin of foot-Left 10/13/2013  . PAD (peripheral artery disease) (HCC)   . TIA (transient ischemic attack)   . Edema   . Hyperlipidemia   . Syncope   . CAD (coronary artery disease)   . Venous  insufficiency   . Carotid artery disease (HCC)   . Elevated CPK   . Popliteal aneurysm (HCC)   . Speech problem   . Hx of CABG   . Ejection fraction   . Prominent abdominal aortic pulsation   . Intracranial hemorrhage (HCC)   . Bradycardia    Loralyn FreshwaterMiguel Geneva Barrero PT, DPT  12/15/2014, 5:55 PM  [facility] [address] [place], [place], [address] Phone: [phone]   Fax:  [phone]

## 2014-12-20 ENCOUNTER — Ambulatory Visit: Payer: Medicare Other

## 2014-12-20 DIAGNOSIS — R531 Weakness: Secondary | ICD-10-CM

## 2014-12-20 NOTE — Therapy (Signed)
[place] [facility] [address] [place], [address] Phone: [phone]   Fax:  [phone]  Physical Therapy Treatment  Patient Details  Name: [name] [name] MRN: [id number] Date of Birth: Feb 03, 1922 No Data Recorded  Encounter Date: 12/20/2014      PT End of Session - 12/20/14 1650    Visit Number 5   Number of Visits 17   Date for PT Re-Evaluation 01/30/15   PT Start Time 1650   PT Stop Time 1736   PT Time Calculation (min) 46 min   Activity Tolerance Patient tolerated treatment well;No increased pain;Patient limited by fatigue   Behavior During Therapy [facility] for tasks assessed/performed      Past Medical History  Diagnosis Date  . CAD (coronary artery disease)     nuclear 01/2008, no ischemia, EF 60%, distal anterior scar  . Hyperlipidemia   . Syncope     .remote..no etiology  /  syncope 11/2008.. from acute leg pain  . Venous insufficiency     support hose  . Carotid artery disease (HCC)     doppler 05/2009,  0-39% bilateral / doppler 06/2010 OK  . Elevated CPK     11/23/08.Marland KitchenMarland Kitchenprobably from debris from popliteal aneurysm  . Popliteal aneurysm (HCC)     .repair..11/25/2008  Dr. Darrick Penna  . Speech problem     Neurologic event with brief infusion and speech difficulty.... January, 2011.... being assessed by neurology  February, 2011  . Hx of CABG     1998  . Ejection fraction     EF 45%, echo,05/2009,apical hypo,  ?? posterior and lateral hypo  . Prominent abdominal aortic pulsation     doppler normal  . Intracranial hemorrhage (HCC)     Two small subependymal bleeds 06/2010,,ASA held  . Bradycardia     06/2010  . Edema     Ankle edema August, 2012  . TIA (transient ischemic attack)     Brief episode of confusion and speech difficulty, January, 2012  /  recurrent confusion speech difficulty September, 2012  . PAD (peripheral artery disease) (HCC)     Above the knee to below the knee popliteal bypass,,  right  //    Doppler,  July, 2013, patent  . Fall at home July 2015    Past Surgical History  Procedure Laterality Date  . Coronary artery bypass graft  1998  . Popliteal bypass  with reversed greater saphenous vein from right leg.      Filed Vitals:   12/20/14 1654  BP: 122/62  Pulse: 89  SpO2: 96%    Visit Diagnosis:  Weakness      Subjective Assessment - 12/20/14 1657    Subjective Pt daughter states that pt feeling weak today. She states that he has dairy allergies and he ate ice cream today. Stomach has been feeling rumbly. Pt is also being seen by a cardiologist for his bilateral LE swelling per daughter   Patient is accompained by: Family member   Limitations Standing;Walking;House hold activities   Patient Stated Goals "To be able to walk again."   Currently in Pain? No/denies   Multiple Pain Sites No      There-ex:  Directed patient with seated LAQ 10x2 with 5 second holds (SpO2 decreases to about 88 %, increased to 95% with rest and breathing technique) Seated hamstring stretch 2 min bilaterally, Seated hip flexion 5x2 each LE, Manually resisted leg press 5x3 each LE Seated gastroc stretch 1  min  each LE Sit <> stand from wheel chair 3 x with mod A to stand, min a to sit, cues for scooting forward, bringing center of gravity over feet, pushing up with his LE and hands (SpO2 90% - 93 % after each attempt. Increases to 97% after rest) Standing with alternating 1 UE assist from rw 6x each UE, Standing with letting go of rw with both hands 6x for balance but with CGA to min A from therapist.    Improved exercise technique, movement at target joints, use of target muscles after mod verbal, visual, tactile cues.   Pt tolerated session well. O2 levels decreases to less than 90% at times with activities but increases back to normal levels room air after rest. Light session performed today secondary to patient feeling weak and SpO2 saturation levels. Pt able to maintain  standing without UE assist but with CGA to min A today.                           PT Education - 12/20/14 1704    Education provided Yes   Education Details ther-ex   [employer]) Educated Patient   Methods Explanation;Demonstration;Tactile cues;Verbal cues   Comprehension Verbalized understanding;Returned demonstration             PT Long Term Goals - 12/05/14 1741    PT LONG TERM GOAL #1   Title Patient will demonstrate improved TUG score of  < 1 min  to decrease fall risk   Baseline 1 min 19 sec   Time 8   Period Weeks   Status New   PT LONG TERM GOAL #2   Title Patient will be able to ambulate with LRAD > 5 minutes without rest for improved ability to ambulate around his house.    Baseline 1-2 min   Time 8   Period Weeks   Status New   PT LONG TERM GOAL #3   Title Patient will demonstrate 5x STS in less than 1 min for improved strength and mobility at home.    Time 8   Period Weeks   Status New   PT LONG TERM GOAL #4   Title Patient will improve LEFS score to > 25/80 demonstrating improved function.   Baseline 19/80   Time 8   Period Weeks   Status New               Plan - 12/20/14 1706    Clinical Impression Statement Pt tolerated session well. O2 levels decreases to less than 90% at times with activities but increases back to normal levels room air after rest. Light session performed today secondary to patient feeling weak and SpO2 saturation levels. Pt able to maintain standing without UE assist but with CGA to min A today.   Pt will benefit from skilled therapeutic intervention in order to improve on the following deficits Decreased activity tolerance;Decreased balance;Impaired flexibility;Decreased mobility   Rehab Potential Fair   PT Frequency 2x / week   PT Duration 8 weeks   PT Treatment/Interventions ADLs/Self Care Home Management;Gait training;Therapeutic activities;Therapeutic exercise;Balance training;Wheelchair mobility  training;Patient/family education;Passive range of motion   PT Next Visit Plan Strengthening, gait, stretching of LEs. balance   Consulted and Agree with Plan of Care Patient        Problem List Patient Active Problem List   Diagnosis Date Noted  . 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 Discoloration of skin of foot-Left 10/13/2013  .  PAD (peripheral artery disease) (HCC)   . TIA (transient ischemic attack)   . Edema   . Hyperlipidemia   . Syncope   . CAD (coronary artery disease)   . Venous insufficiency   . Carotid artery disease (HCC)   . Elevated CPK   . Popliteal aneurysm (HCC)   . Speech problem   . Hx of CABG   . Ejection fraction   . Prominent abdominal aortic pulsation   . Intracranial hemorrhage (HCC)   . Bradycardia    Loralyn FreshwaterMiguel Jasslyn Finkel PT, DPT  12/20/2014, 6:03 PM  [place] [facility] [address] [place], [place], [address] Phone: [phone]   Fax:  [phone]  Name: [name] [name] MRN: [id number] Date of Birth: 12/02/1921

## 2014-12-22 ENCOUNTER — Ambulatory Visit: Payer: Medicare Other

## 2014-12-22 VITALS — BP 113/63 | HR 81

## 2014-12-22 DIAGNOSIS — R531 Weakness: Secondary | ICD-10-CM | POA: Diagnosis not present

## 2014-12-22 NOTE — Patient Instructions (Signed)
   Sitting on chair, slide heels back until you feel a comfortable moderate stretch behind your legs of heel. Hold for 2 min. Perform 2 times daily.

## 2014-12-22 NOTE — Therapy (Signed)
[place] [facility] [address] [place], [address] Phone: [phone]   Fax:  [phone]  Physical Therapy Treatment  Patient Details  Name: [name] [name] MRN: [id number] Date of Birth: 1921/10/15 No Data Recorded  Encounter Date: 12/22/2014      PT End of Session - 12/22/14 1653    Visit Number 6   Number of Visits 17   Date for PT Re-Evaluation 01/30/15   PT Start Time 1650   PT Stop Time 1737   PT Time Calculation (min) 47 min   Activity Tolerance Patient tolerated treatment well;No increased pain;Patient limited by fatigue   Behavior During Therapy [facility] for tasks assessed/performed      Past Medical History  Diagnosis Date  . CAD (coronary artery disease)     nuclear 01/2008, no ischemia, EF 60%, distal anterior scar  . Hyperlipidemia   . Syncope     .remote..no etiology  /  syncope 11/2008.. from acute leg pain  . Venous insufficiency     support hose  . Carotid artery disease (HCC)     doppler 05/2009,  0-39% bilateral / doppler 06/2010 OK  . Elevated CPK     11/23/08.Marland KitchenMarland Kitchenprobably from debris from popliteal aneurysm  . Popliteal aneurysm (HCC)     .repair..11/25/2008  Dr. Darrick Penna  . Speech problem     Neurologic event with brief infusion and speech difficulty.... January, 2011.... being assessed by neurology  February, 2011  . Hx of CABG     1998  . Ejection fraction     EF 45%, echo,05/2009,apical hypo,  ?? posterior and lateral hypo  . Prominent abdominal aortic pulsation     doppler normal  . Intracranial hemorrhage (HCC)     Two small subependymal bleeds 06/2010,,ASA held  . Bradycardia     06/2010  . Edema     Ankle edema August, 2012  . TIA (transient ischemic attack)     Brief episode of confusion and speech difficulty, January, 2012  /  recurrent confusion speech difficulty September, 2012  . PAD (peripheral artery disease) (HCC)     Above the knee to below the knee popliteal bypass,,  right  //    Doppler,  July, 2013, patent  . Fall at home July 2015    Past Surgical History  Procedure Laterality Date  . Coronary artery bypass graft  1998  . Popliteal bypass  with reversed greater saphenous vein from right leg.      Filed Vitals:   12/22/14 1655  BP: 113/63  Pulse: 81  SpO2: 96%    Visit Diagnosis:  Weakness      Subjective Assessment - 12/22/14 1654    Subjective Pt daughter states pt feeling better.    Patient is accompained by: Family member   Limitations Standing;Walking;House hold activities   Patient Stated Goals "To be able to walk again."   Currently in Pain? No/denies   Multiple Pain Sites No       Objectives:  There-ex:  Vitals obtained prior to exercises.  Directed patient with sit <> stand from wc with min A to Mod A throughout session, cues for scooting forward, bringing center of gravity over base of support  Standing with CGA to min A and letting go of the walker then holding onto it 7x for standing balance, Gait with rw 32 ft, 49 ft (96% SpO2 afterwards), and 57 ft (SpO2 97 % room air) with CGA +1,  Standing  mini squats with bilateral UE assist on rw 10x2 Seated bilateral knee extension stretch 2 min (for hamstrings),  Seated hip flexion from wc to promote swing phase of gait 10x2 Seated soleus stretch 2 min each LE to promote ankle DF.   Improved exercise technique, movement at target joints, use of target muscles after mod verbal, visual, tactile cues.   Improved activity tolerance today, being able to ambulate 57 ft. Though still week, pt is better able to support himself with bilateral LE in the standing position and walking.                         PT Education - 12/22/14 1726    Education provided Yes   Education Details ther-ex, HEP   Person(s) Educated Patient   Methods Explanation;Demonstration;Tactile cues;Verbal cues   Comprehension Verbalized understanding;Returned demonstration              PT Long Term Goals - 12/05/14 1741    PT LONG TERM GOAL #1   Title Patient will demonstrate improved TUG score of  < 1 min  to decrease fall risk   Baseline 1 min 19 sec   Time 8   Period Weeks   Status New   PT LONG TERM GOAL #2   Title Patient will be able to ambulate with LRAD > 5 minutes without rest for improved ability to ambulate around his house.    Baseline 1-2 min   Time 8   Period Weeks   Status New   PT LONG TERM GOAL #3   Title Patient will demonstrate 5x STS in less than 1 min for improved strength and mobility at home.    Time 8   Period Weeks   Status New   PT LONG TERM GOAL #4   Title Patient will improve LEFS score to > 25/80 demonstrating improved function.   Baseline 19/80   Time 8   Period Weeks   Status New               Plan - 12/22/14 1727    Clinical Impression Statement Improved activity tolerance today, being able to ambulate 57 ft. Though still week, pt is better able to support himself with bilateral LE in the standing position and walking.   Pt will benefit from skilled therapeutic intervention in order to improve on the following deficits Decreased activity tolerance;Decreased balance;Impaired flexibility;Decreased mobility   Rehab Potential Fair   PT Frequency 2x / week   PT Duration 8 weeks   PT Treatment/Interventions ADLs/Self Care Home Management;Gait training;Therapeutic activities;Therapeutic exercise;Balance training;Wheelchair mobility training;Patient/family education;Passive range of motion   PT Next Visit Plan Strengthening, gait, stretching of LEs. balance   Consulted and Agree with Plan of Care Patient        Problem List Patient Active Problem List   Diagnosis Date Noted  . 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 Discoloration of skin of foot-Left 10/13/2013  . PAD (peripheral artery disease) (HCC)   . TIA (transient ischemic attack)   . Edema   . Hyperlipidemia   . Syncope   . CAD  (coronary artery disease)   . Venous insufficiency   . Carotid artery disease (HCC)   . Elevated CPK   . Popliteal aneurysm (HCC)   . Speech problem   . Hx of CABG   . Ejection fraction   . Prominent abdominal aortic pulsation   . Intracranial hemorrhage (HCC)   . Bradycardia  Loralyn FreshwaterMiguel Aidynn Polendo PT, DPT   12/22/2014, 7:51 PM  [facility] [address] [place], [place], [address] Phone: [phone]   Fax:  [phone]  Name: [name] [name] MRN: [id number] Date of Birth: 03/20/1921

## 2014-12-27 ENCOUNTER — Ambulatory Visit: Payer: Medicare Other

## 2014-12-27 VITALS — BP 118/72 | HR 67

## 2014-12-27 DIAGNOSIS — R531 Weakness: Secondary | ICD-10-CM | POA: Diagnosis not present

## 2014-12-27 NOTE — Therapy (Signed)
[facility] [address] [place], [address] Phone: [phone]   Fax:  [phone]  Physical Therapy Treatment  Patient Details  Name: RANEN DOOLIN MRN: [id number] Date of Birth: 06-29-1921 No Data Recorded  Encounter Date: 12/27/2014      PT End of Session - 12/27/14 1650    Visit Number 7   Number of Visits 17   Date for PT Re-Evaluation 01/30/15   PT Start Time 1650   PT Stop Time 1735   PT Time Calculation (min) 45 min   Activity Tolerance Patient tolerated treatment well;No increased pain;Patient limited by fatigue   Behavior During Therapy [facility] for tasks assessed/performed      Past Medical History  Diagnosis Date  . CAD (coronary artery disease)     nuclear 01/2008, no ischemia, EF 60%, distal anterior scar  . Hyperlipidemia   . Syncope     .remote..no etiology  /  syncope 11/2008.. from acute leg pain  . Venous insufficiency     support hose  . Carotid artery disease (HCC)     doppler 05/2009,  0-39% bilateral / doppler 06/2010 OK  . Elevated CPK     11/23/08.Marland KitchenMarland Kitchenprobably from debris from popliteal aneurysm  . Popliteal aneurysm (HCC)     .repair..11/25/2008  Dr. Darrick Penna  . Speech problem     Neurologic event with brief infusion and speech difficulty.... January, 2011.... being assessed by neurology  February, 2011  . Hx of CABG     1998  . Ejection fraction     EF 45%, echo,05/2009,apical hypo,  ?? posterior and lateral hypo  . Prominent abdominal aortic pulsation     doppler normal  . Intracranial hemorrhage (HCC)     Two small subependymal bleeds 06/2010,,ASA held  . Bradycardia     06/2010  . Edema     Ankle edema August, 2012  . TIA (transient ischemic attack)     Brief episode of confusion and speech difficulty, January, 2012  /  recurrent confusion speech difficulty September, 2012  . PAD (peripheral artery disease) (HCC)     Above the knee to below the knee popliteal bypass,,  right  //    Doppler,  July, 2013, patent  . Fall at home July 2015    Past Surgical History  Procedure Laterality Date  . Coronary artery bypass graft  1998  . Popliteal bypass  with reversed greater saphenous vein from right leg.      Filed Vitals:   12/27/14 1657 12/27/14 1701  BP: 118/72   Pulse: 67   SpO2: 89% 93%    Visit Diagnosis:  Weakness      Subjective Assessment - 12/27/14 1655    Subjective Pt states he's doing alright except that he can't walk   Patient is accompained by: Family member   Limitations Standing;Walking;House hold activities   Patient Stated Goals "To be able to walk again."   Currently in Pain? No/denies   Multiple Pain Sites No     Objectives  There-ex Vitals obtained and monitored Directed patient with seated knee extension stretch 2 min LAQ 10x2 with 5 second holds (SpO2 93%-96% afterwards)  Sit <> stand from wc multiple times throughout session, cues for bringing center of gravity over base of support, hand placement Standing static balance without UE assist 10x (letting go of rw) CGA from PT Gait with rw 82 ft, and 100 ft CGA with another person pushing rw (SpO2  97% and 96%  Afterwards), cues for closer proximity to rw, increase hip and knee flexion for foot clearance R and L gastroc stretch 1 min each   Improved exercise technique, movement at target joints, use of target muscles after mod verbal, visual, tactile cues.      Improved gait distance up to 100 ft today with rw, CGA. Pt making progress towards walking longer distances. Pt stated feeling good after session.                         PT Education - 12/27/14 1704    Education provided Yes   Education Details ther-ex   Person(s) Educated Patient   Methods Explanation;Demonstration;Verbal cues   Comprehension Verbalized understanding;Returned demonstration             PT Long Term Goals - 12/05/14 1741    PT LONG TERM GOAL #1   Title Patient will  demonstrate improved TUG score of  < 1 min  to decrease fall risk   Baseline 1 min 19 sec   Time 8   Period Weeks   Status New   PT LONG TERM GOAL #2   Title Patient will be able to ambulate with LRAD > 5 minutes without rest for improved ability to ambulate around his house.    Baseline 1-2 min   Time 8   Period Weeks   Status New   PT LONG TERM GOAL #3   Title Patient will demonstrate 5x STS in less than 1 min for improved strength and mobility at home.    Time 8   Period Weeks   Status New   PT LONG TERM GOAL #4   Title Patient will improve LEFS score to > 25/80 demonstrating improved function.   Baseline 19/80   Time 8   Period Weeks   Status New               Plan - 12/27/14 1705    Clinical Impression Statement Improved gait distance up to 100 ft today with rw, CGA. Pt making progress towards walking longer distances. Pt stated feeling good after session.    Pt will benefit from skilled therapeutic intervention in order to improve on the following deficits Decreased activity tolerance;Decreased balance;Impaired flexibility;Decreased mobility   Rehab Potential Fair   PT Frequency 2x / week   PT Duration 8 weeks   PT Treatment/Interventions ADLs/Self Care Home Management;Gait training;Therapeutic activities;Therapeutic exercise;Balance training;Wheelchair mobility training;Patient/family education;Passive range of motion   PT Next Visit Plan Strengthening, gait, stretching of LEs. balance   Consulted and Agree with Plan of Care Patient        Problem List Patient Active Problem List   Diagnosis Date Noted  . 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 Discoloration of skin of foot-Left 10/13/2013  . PAD (peripheral artery disease) (HCC)   . TIA (transient ischemic attack)   . Edema   . Hyperlipidemia   . Syncope   . CAD (coronary artery disease)   . Venous insufficiency   . Carotid artery disease (HCC)   . Elevated CPK   . Popliteal aneurysm (HCC)    . Speech problem   . Hx of CABG   . Ejection fraction   . Prominent abdominal aortic pulsation   . Intracranial hemorrhage (HCC)   . Bradycardia     Loralyn FreshwaterMiguel Laygo PT, DPT   12/27/2014, 6:06 PM  [facility]  [address], [place], [address] Phone: [phone]   Fax:  [phone]  Name: LEWIE DEMAN MRN: [id number] Date of Birth: [birth year]

## 2014-12-29 ENCOUNTER — Ambulatory Visit: Payer: Medicare Other

## 2014-12-29 VITALS — BP 130/66 | HR 72

## 2014-12-29 DIAGNOSIS — R531 Weakness: Secondary | ICD-10-CM

## 2014-12-29 NOTE — Therapy (Signed)
[facility] [address], [place], [address] Phone: [phone]   Fax:  [phone]  Physical Therapy Treatment  Patient Details  Name: [name] [name] MRN: [id number] Date of Birth: 25-Feb-1922 No Data Recorded  Encounter Date: 12/29/2014      PT End of Session - 12/29/14 1642    Visit Number 8   Number of Visits 17   Date for PT Re-Evaluation 01/30/15   PT Start Time 1642   PT Stop Time 1729   PT Time Calculation (min) 47 min   Equipment Utilized During Treatment Gait belt   Activity Tolerance Patient tolerated treatment well;No increased pain;Patient limited by fatigue   Behavior During Therapy [facility] for tasks assessed/performed      Past Medical History  Diagnosis Date  . CAD (coronary artery disease)     nuclear 01/2008, no ischemia, EF 60%, distal anterior scar  . Hyperlipidemia   . Syncope     .remote..no etiology  /  syncope 11/2008.. from acute leg pain  . Venous insufficiency     support hose  . Carotid artery disease (HCC)     doppler 05/2009,  0-39% bilateral / doppler 06/2010 OK  . Elevated CPK     11/23/08.Marland KitchenMarland Kitchenprobably from debris from popliteal aneurysm  . Popliteal aneurysm (HCC)     .repair..11/25/2008  Dr. Darrick Penna  . Speech problem     Neurologic event with brief infusion and speech difficulty.... January, 2011.... being assessed by neurology  February, 2011  . Hx of CABG     1998  . Ejection fraction     EF 45%, echo,05/2009,apical hypo,  ?? posterior and lateral hypo  . Prominent abdominal aortic pulsation     doppler normal  . Intracranial hemorrhage (HCC)     Two small subependymal bleeds 06/2010,,ASA held  . Bradycardia     06/2010  . Edema     Ankle edema August, 2012  . TIA (transient ischemic attack)     Brief episode of confusion and speech difficulty, January, 2012  /  recurrent confusion speech difficulty September, 2012  . PAD (peripheral artery disease) (HCC)     Above  the knee to below the knee popliteal bypass,, right  //    Doppler,  July, 2013, patent  . Fall at home July 2015    Past Surgical History  Procedure Laterality Date  . Coronary artery bypass graft  1998  . Popliteal bypass  with reversed greater saphenous vein from right leg.      Filed Vitals:   12/29/14 1647  BP: 130/66  Pulse: 72  SpO2: 95%    Visit Diagnosis:  Weakness      Subjective Assessment - 12/29/14 1650    Subjective Pt states he's doing ok   Patient is accompained by: Family member   Limitations Standing;Walking;House hold activities   Patient Stated Goals "To be able to walk again."   Currently in Pain? No/denies   Multiple Pain Sites No      Objectives:  There-ex:  Directed patient with static standing balance without UE assist 10 seconds x 3 with CGA, Seated knee extension stretch 2 min bilaterally Standing mini squats 10x with bilateral UE assist,  Seated gastroc stretch 1 min each LE Gait 54 ft with rw CGA +1 (SpO2 95% room air), 100 ft with rw CGA + 1 (90%-94% room air) Seated hip flexion 10x3 Sit <> stand throughout session CGA to min A to  stand, min A to mod A to sit from wc (cues for placing his center of gravity over his feet and use of bilateral UE) Seated LAQ 10x5 seconds each LE   Improved exercise technique, movement at target joints, use of target muscles after mod verbal, visual, tactile cues.     Able to ambulate 100 ft again today with rw. Needed cues to stay closer to AD and for increasing hip and knee flexion to promote better foot clearance.                           PT Education - 12/29/14 1655    Education provided Yes   Education Details ther-ex   Starwood HotelsPerson(s) Educated Patient   Methods Explanation;Demonstration;Tactile cues;Verbal cues   Comprehension Verbalized understanding;Returned demonstration             PT Long Term Goals - 12/05/14 1741    PT LONG TERM GOAL #1   Title Patient will  demonstrate improved TUG score of  < 1 min  to decrease fall risk   Baseline 1 min 19 sec   Time 8   Period Weeks   Status New   PT LONG TERM GOAL #2   Title Patient will be able to ambulate with LRAD > 5 minutes without rest for improved ability to ambulate around his house.    Baseline 1-2 min   Time 8   Period Weeks   Status New   PT LONG TERM GOAL #3   Title Patient will demonstrate 5x STS in less than 1 min for improved strength and mobility at home.    Time 8   Period Weeks   Status New   PT LONG TERM GOAL #4   Title Patient will improve LEFS score to > 25/80 demonstrating improved function.   Baseline 19/80   Time 8   Period Weeks   Status New               Plan - 12/29/14 1656    Clinical Impression Statement Able to ambulate 100 ft again today with rw. Needed cues to stay closer to AD and for increasing hip and knee flexion to promote better foot clearance.    Pt will benefit from skilled therapeutic intervention in order to improve on the following deficits Decreased activity tolerance;Decreased balance;Impaired flexibility;Decreased mobility   Rehab Potential Fair   PT Frequency 2x / week   PT Duration 8 weeks   PT Treatment/Interventions ADLs/Self Care Home Management;Gait training;Therapeutic activities;Therapeutic exercise;Balance training;Wheelchair mobility training;Patient/family education;Passive range of motion   PT Next Visit Plan Strengthening, gait, stretching of LEs. balance   Consulted and Agree with Plan of Care Patient        Problem List Patient Active Problem List   Diagnosis Date Noted  . 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 Discoloration of skin of foot-Left 10/13/2013  . PAD (peripheral artery disease) (HCC)   . TIA (transient ischemic attack)   . Edema   . Hyperlipidemia   . Syncope   . CAD (coronary artery disease)   . Venous insufficiency   . Carotid artery disease (HCC)   . Elevated CPK   . Popliteal aneurysm  (HCC)   . Speech problem   . Hx of CABG   . Ejection fraction   . Prominent abdominal aortic pulsation   . Intracranial hemorrhage (HCC)   . Bradycardia    Loralyn FreshwaterMiguel Josephus Harriger PT, DPT   12/29/2014,  5:44 PM  [facility] [address], [place], [address] Phone: [phone]   Fax:  [phone]  Name: [name] [name] MRN: [id number] Date of Birth: September 14, 1921

## 2015-01-03 ENCOUNTER — Ambulatory Visit: Payer: Medicare Other | Attending: Family Medicine

## 2015-01-03 VITALS — BP 118/64 | HR 88

## 2015-01-03 DIAGNOSIS — R531 Weakness: Secondary | ICD-10-CM | POA: Diagnosis present

## 2015-01-03 DIAGNOSIS — R262 Difficulty in walking, not elsewhere classified: Secondary | ICD-10-CM | POA: Insufficient documentation

## 2015-01-03 NOTE — Therapy (Signed)
[facility] [address] [place], [place], [address] Phone: [phone]   Fax:  [phone]  Physical Therapy Treatment  Patient Details  Name: [name] [name] MRN: [id number] Date of Birth: 10/10/1921 No Data Recorded  Encounter Date: 01/03/2015      PT End of Session - 01/03/15 1745    Visit Number 9   Number of Visits 17   Date for PT Re-Evaluation 01/30/15   PT Start Time 1745   PT Stop Time 1834   PT Time Calculation (min) 49 min   Equipment Utilized During Treatment Gait belt   Activity Tolerance Patient tolerated treatment well;No increased pain;Patient limited by fatigue   Behavior [facility] for tasks assessed/performed      Past Medical History  Diagnosis Date  . CAD (coronary artery disease)     nuclear 01/2008, no ischemia, EF 60%, distal anterior scar  . Hyperlipidemia   . Syncope     .remote..no etiology  /  syncope 11/2008.. from acute leg pain  . Venous insufficiency     support hose  . Carotid artery disease (HCC)     doppler 05/2009,  0-39% bilateral / doppler 06/2010 OK  . Elevated CPK     11/23/08.Marland Kitchen.Marland Kitchen.probably from debris from popliteal aneurysm  . Popliteal aneurysm (HCC)     .repair..11/25/2008  Dr. Darrick PennaFields  . Speech problem     Neurologic event with brief infusion and speech difficulty.... January, 2011.... being assessed by neurology  February, 2011  . Hx of CABG     1998  . Ejection fraction     EF 45%, echo,05/2009,apical hypo,  ?? posterior and lateral hypo  . Prominent abdominal aortic pulsation     doppler normal  . Intracranial hemorrhage (HCC)     Two small subependymal bleeds 06/2010,,ASA held  . Bradycardia     06/2010  . Edema     Ankle edema August, 2012  . TIA (transient ischemic attack)     Brief episode of confusion and speech difficulty, January, 2012  /  recurrent confusion speech difficulty September, 2012  . PAD (peripheral artery disease) (HCC)     Above  the knee to below the knee popliteal bypass,, right  //    Doppler,  July, 2013, patent  . Fall at home July 2015    Past Surgical History  Procedure Laterality Date  . Coronary artery bypass graft  1998  . Popliteal bypass  with reversed greater saphenous vein from right leg.      Filed Vitals:   01/03/15 1751  BP: 118/64  Pulse: 88  SpO2: 97%    Visit Diagnosis:  Weakness      Subjective Assessment - 01/03/15 1750    Subjective "Doing ok"   Patient is accompained by: Family member   Limitations Standing;Walking;House hold activities   Patient Stated Goals "To be able to walk again."   Currently in Pain? No/denies   Multiple Pain Sites No      Objectives:  There-ex:  Directed patient with static standing balance without UE assist 10x with CGA to min A, Standing static mini squats with bilateral UE assist from rw 10x  Seated hip flexion 10x3 each LE, Seated ankle DF/PF 10x3 each direction Seated knee extension stretch 2 min bilaterally SpO2 prior to gait 97% room air Gait with rw 69 ft, 75 ft (SpO2 96% room air), Seated manual gastroc stretch 1 min each side.   Vital signs  monitored.  Improved exercise technique, movement at target joints, use of target muscles after mod verbal, visual, tactile cues.    Pt demonstrates difficulty supporting himself with bilateral LE (decreased hip and knee extension) with prolonged standing such as when walking today. Physical therapy session also performed later during the day which may play a factor with pt energy level.                            PT Education - 01/03/15 1751    Education provided Yes   Education Details ther-ex   [employer]) Educated Patient   Methods Explanation;Demonstration;Verbal cues;Tactile cues   Comprehension Verbalized understanding;Returned demonstration             PT Long Term Goals - 12/05/14 1741    PT LONG TERM GOAL #1   Title Patient will demonstrate improved TUG  score of  < 1 min  to decrease fall risk   Baseline 1 min 19 sec   Time 8   Period Weeks   Status New   PT LONG TERM GOAL #2   Title Patient will be able to ambulate with LRAD > 5 minutes without rest for improved ability to ambulate around his house.    Baseline 1-2 min   Time 8   Period Weeks   Status New   PT LONG TERM GOAL #3   Title Patient will demonstrate 5x STS in less than 1 min for improved strength and mobility at home.    Time 8   Period Weeks   Status New   PT LONG TERM GOAL #4   Title Patient will improve LEFS score to > 25/80 demonstrating improved function.   Baseline 19/80   Time 8   Period Weeks   Status New               Plan - 01/03/15 1752    Clinical Impression Statement Pt demonstrates difficulty supporting himself with bilateral LE (decreased hip and knee extension) with prolonged standing such as when walking today. Physical therapy session also performed later during the day which may play a factor with pt energy level.    Pt will benefit from skilled therapeutic intervention in order to improve on the following deficits Decreased activity tolerance;Decreased balance;Impaired flexibility;Decreased mobility   Rehab Potential Fair   PT Frequency 2x / week   PT Duration 8 weeks   PT Treatment/Interventions ADLs/Self Care Home Management;Gait training;Therapeutic activities;Therapeutic exercise;Balance training;Wheelchair mobility training;Patient/family education;Passive range of motion   PT Next Visit Plan Strengthening, gait, stretching of LEs. balance   Consulted and Agree with Plan of Care Patient        Problem List Patient Active Problem List   Diagnosis Date Noted  . 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 Discoloration of skin of foot-Left 10/13/2013  . PAD (peripheral artery disease) (HCC)   . TIA (transient ischemic attack)   . Edema   . Hyperlipidemia   . Syncope   . CAD (coronary artery disease)   . Venous insufficiency    . Carotid artery disease (HCC)   . Elevated CPK   . Popliteal aneurysm (HCC)   . Speech problem   . Hx of CABG   . Ejection fraction   . Prominent abdominal aortic pulsation   . Intracranial hemorrhage (HCC)   . Bradycardia    Loralyn Freshwater PT, DPT   01/03/2015, 6:49 PM  [place] [facility]  PHYSICAL AND SPORTS MEDICINE [address] [place], [address] Phone: [phone]   Fax:  [phone]  Name: [name] [name] MRN: [id number] Date of Birth: Jul 21, 1921

## 2015-01-05 ENCOUNTER — Ambulatory Visit: Payer: Medicare Other

## 2015-01-05 VITALS — BP 112/72 | HR 72

## 2015-01-05 DIAGNOSIS — R531 Weakness: Secondary | ICD-10-CM | POA: Diagnosis not present

## 2015-01-05 NOTE — Patient Instructions (Addendum)
Pt daughter was instructed to try working on sit <> stand exercises with her dad (exercises to be performed only when a daughter is with patient for safety, with wc locked and back against the wall so the chair does not slide back) about 5x daily. Pt daughter and pt verbalized understanding.   Pt can perform LAQ, seated hip flexion, and ankle pumps himself in sitting. Pt demonstrated and verbalized understanding.

## 2015-01-05 NOTE — Therapy (Signed)
[facility] [address] [place], [address] Phone: [phone]   Fax:  [phone]  Physical Therapy Treatment And Progress Report (12/05/14 - 01/05/15)  Patient Details  Name: [name] [name] MRN: [id number] Date of Birth: 19-Feb-1922 No Data Recorded  Encounter Date: 01/05/2015      PT End of Session - 01/05/15 1750    Visit Number 10   Number of Visits 17   Date for PT Re-Evaluation 01/30/15   Authorization Type 1   Authorization Time Period of 10   PT Start Time 1750   PT Stop Time 1838   PT Time Calculation (min) 48 min   Equipment Utilized During Treatment Gait belt   Activity Tolerance Patient tolerated treatment well;No increased pain;Patient limited by fatigue   Behavior During Therapy [facility] for tasks assessed/performed      Past Medical History  Diagnosis Date  . CAD (coronary artery disease)     nuclear 01/2008, no ischemia, EF 60%, distal anterior scar  . Hyperlipidemia   . Syncope     .remote..no etiology  /  syncope 11/2008.. from acute leg pain  . Venous insufficiency     support hose  . Carotid artery disease (HCC)     doppler 05/2009,  0-39% bilateral / doppler 06/2010 OK  . Elevated CPK     11/23/08.Marland KitchenMarland Kitchenprobably from debris from popliteal aneurysm  . Popliteal aneurysm (HCC)     .repair..11/25/2008  Dr. Darrick Penna  . Speech problem     Neurologic event with brief infusion and speech difficulty.... January, 2011.... being assessed by neurology  February, 2011  . Hx of CABG     1998  . Ejection fraction     EF 45%, echo,05/2009,apical hypo,  ?? posterior and lateral hypo  . Prominent abdominal aortic pulsation     doppler normal  . Intracranial hemorrhage (HCC)     Two small subependymal bleeds 06/2010,,ASA held  . Bradycardia     06/2010  . Edema     Ankle edema August, 2012  . TIA (transient ischemic attack)     Brief episode of confusion and speech difficulty, January, 2012  /   recurrent confusion speech difficulty September, 2012  . PAD (peripheral artery disease) (HCC)     Above the knee to below the knee popliteal bypass,, right  //    Doppler,  July, 2013, patent  . Fall at home July 2015    Past Surgical History  Procedure Laterality Date  . Coronary artery bypass graft  1998  . Popliteal bypass  with reversed greater saphenous vein from right leg.      Filed Vitals:   01/05/15 1754  BP: 112/72  Pulse: 72  SpO2: 93%    Visit Diagnosis:  Weakness      Subjective Assessment - 01/05/15 1754    Subjective Pt states that he is doing alright. Has enough energy to do what he needs to do at home although he does not do much after therapy.    Patient is accompained by: Family member   Limitations Standing;Walking;House hold activities   Patient Stated Goals "To be able to walk again."   Currently in Pain? No/denies   Multiple Pain Sites No        Objectives:  There-ex Directed patient with standing balance without UE assist 10 seconds x 2 (CGA to Min A; tendency for backward lean and LE fatigue) seated knee extension 10x2 with 5 second holds  each LE,  Seated knee extension stretch 2 min,  Seated manually resisted leg press 10x2 each LE Seated leg press resisting green band 10x2 each LE,  96% to 97 % room air prior to walking Standing up from a chair, walking 10 ft to the line, then 10 ft back to his chair x 2. 93% to 96% room air afterwards. Cues to stay closer to rw and proper positioning prior to sitting down.   Vitals monitored  Improved exercise technique, movement at target joints, use of target muscles after mod verbal, visual, tactile cues.    Reviewed HEP. Please see patient instructions.    TUG 1 min 57 seconds, with min A to stand, mod A to sit. Increased time secondary to multiple attempts to stand up. TUG 1 min 33 seconds second try when pt was assisted (mod A) to stand at first try so that he does not perform multiple tries. Pt  demonstrates increased TUG times today since initial evaluation, partly due to difficulty performing sit <> stand transfers, needing between min to mod A today and multiple attempts. Pt appointment time however, is later during the day which may play a factor with his energy levels. Pt was able to ambulate 100 ft 2 days in a row last week using rw with CGA with another person following with a wheel chair (appointment about 45 minutes earlier than most recent sessions). Patient demonstrates LE weakness, low activity tolerance, difficulty walking and would benefit from continued skilled physical therapy services to address the aforementioned deficits.                    PT Education - 01/05/15 1859    Education provided Yes   Education Details ther-ex, HEP   Person(s) Educated Patient;Child(ren)  one of his 3 daughters   Methods Explanation;Demonstration;Tactile cues;Verbal cues   Comprehension Verbalized understanding;Returned demonstration             PT Long Term Goals - 01/05/15 1902    PT LONG TERM GOAL #1   Title Patient will demonstrate improved TUG score of  < 1 min  to decrease fall risk   Baseline 1 min 19 sec   Time 8   Period Weeks   Status On-going   PT LONG TERM GOAL #2   Title Patient will be able to ambulate with LRAD > 5 minutes without rest for improved ability to ambulate around his house.    Baseline 1-2 min   Time 8   Period Weeks   Status On-going   PT LONG TERM GOAL #3   Title Patient will demonstrate 5x STS in less than 1 min for improved strength and mobility at home.    Time 8   Period Weeks   Status On-going   PT LONG TERM GOAL #4   Title Patient will improve LEFS score to > 25/80 demonstrating improved function.   Baseline 19/80   Time 8   Period Weeks   Status On-going               Plan - 01/05/15 1901    Clinical Impression Statement TUG 1 min 57 seconds, with min A to stand, mod A to sit. Increased time secondary to  multiple attempts to stand up. TUG 1 min 33 seconds second try when pt was assisted (mod A) to stand at first try so that he does not perform multiple tries. Pt demonstrates increased TUG times today since initial evaluation, partly due to  difficulty performing sit <> stand transfers, needing between min to mod A today and multiple attempts. Pt appointment time however, is later during the day which may play a factor with his energy levels. Pt was able to ambulate 100 ft 2 days in a row last week using rw with CGA with another person following with a wheel chair (appointment about 45 minutes earlier than most recent sessions). Patient demonstrates LE weakness, low activity tolerance, difficulty walking and would benefit from continued skilled physical therapy services to address the aforementioned deficits.    Pt will benefit from skilled therapeutic intervention in order to improve on the following deficits Decreased activity tolerance;Decreased balance;Impaired flexibility;Decreased mobility   Rehab Potential Fair   PT Frequency 2x / week   PT Duration 8 weeks   PT Treatment/Interventions ADLs/Self Care Home Management;Gait training;Therapeutic activities;Therapeutic exercise;Balance training;Wheelchair mobility training;Patient/family education;Passive range of motion   PT Next Visit Plan Strengthening, gait, stretching of LEs. balance   Consulted and Agree with Plan of Care Patient;Family member/caregiver   Family Member Consulted one of pt daughters          G-Codes - 01/05/15 1904    Functional Assessment Tool Used  TUG, clinical presentation   Functional Limitation Mobility: Walking and moving around   Mobility: Walking and Moving Around Current Status [phone](G8978) At least 60 percent but less than 80 percent impaired, limited or restricted   Mobility: Walking and Moving Around Goal Status [phone](G8979) At least 40 percent but less than 60 percent impaired, limited or restricted      Problem  List Patient Active Problem List   Diagnosis Date Noted  . 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 Discoloration of skin of foot-Left 10/13/2013  . PAD (peripheral artery disease) (HCC)   . TIA (transient ischemic attack)   . Edema   . Hyperlipidemia   . Syncope   . CAD (coronary artery disease)   . Venous insufficiency   . Carotid artery disease (HCC)   . Elevated CPK   . Popliteal aneurysm (HCC)   . Speech problem   . Hx of CABG   . Ejection fraction   . Prominent abdominal aortic pulsation   . Intracranial hemorrhage (HCC)   . Bradycardia     Loralyn FreshwaterMiguel Pahoua Schreiner PT, DPT   01/05/2015, 7:40 PM  [facility] [address] [place], [place], [address] Phone: [phone]   Fax:  [phone]  Name: [name] [name] MRN: [id number] Date of Birth: 07/27/1921

## 2015-01-10 ENCOUNTER — Ambulatory Visit: Payer: Medicare Other

## 2015-01-10 VITALS — BP 126/77 | HR 82

## 2015-01-10 DIAGNOSIS — R531 Weakness: Secondary | ICD-10-CM | POA: Diagnosis not present

## 2015-01-10 NOTE — Therapy (Signed)
[facility] [address], [place], [address] Phone: [phone]   Fax:  [phone]  Physical Therapy Treatment  Patient Details  Name: DERRION TRITZ MRN: [id number] Date of Birth: 1921-10-07 No Data Recorded  Encounter Date: 01/10/2015      PT End of Session - 01/10/15 1528    Visit Number 11   Number of Visits 17   Date for PT Re-Evaluation 01/30/15   Authorization Type 2   Authorization Time Period of 10   PT Start Time 1522   PT Stop Time 1603   PT Time Calculation (min) 41 min   Equipment Utilized During Treatment Gait belt   Activity Tolerance Patient tolerated treatment well;No increased pain;Patient limited by fatigue   Behavior During Therapy [facility] for tasks assessed/performed      Past Medical History  Diagnosis Date  . CAD (coronary artery disease)     nuclear 01/2008, no ischemia, EF 60%, distal anterior scar  . Hyperlipidemia   . Syncope     .remote..no etiology  /  syncope 11/2008.. from acute leg pain  . Venous insufficiency     support hose  . Carotid artery disease (HCC)     doppler 05/2009,  0-39% bilateral / doppler 06/2010 OK  . Elevated CPK     11/23/08.Marland KitchenMarland Kitchenprobably from debris from popliteal aneurysm  . Popliteal aneurysm (HCC)     .repair..11/25/2008  Dr. Darrick Penna  . Speech problem     Neurologic event with brief infusion and speech difficulty.... January, 2011.... being assessed by neurology  February, 2011  . Hx of CABG     1998  . Ejection fraction     EF 45%, echo,05/2009,apical hypo,  ?? posterior and lateral hypo  . Prominent abdominal aortic pulsation     doppler normal  . Intracranial hemorrhage (HCC)     Two small subependymal bleeds 06/2010,,ASA held  . Bradycardia     06/2010  . Edema     Ankle edema August, 2012  . TIA (transient ischemic attack)     Brief episode of confusion and speech difficulty, January, 2012  /  recurrent confusion speech difficulty September,  2012  . PAD (peripheral artery disease) (HCC)     Above the knee to below the knee popliteal bypass,, right  //    Doppler,  July, 2013, patent  . Fall at home July 2015    Past Surgical History  Procedure Laterality Date  . Coronary artery bypass graft  1998  . Popliteal bypass  with reversed greater saphenous vein from right leg.      Filed Vitals:   01/10/15 1531  BP: 126/77  Pulse: 82  SpO2: 95%    Visit Diagnosis:  Weakness      Subjective Assessment - 01/10/15 1527    Subjective Pt states eating a sandwhich (ham or [place]) Pt daughter states pt gets meat and 2 vegetables every night.    Patient is accompained by: Family member   Limitations Standing;Walking;House hold activities   Patient Stated Goals "To be able to walk again."   Currently in Pain? No/denies   Multiple Pain Sites No     There-ex  Directed patient with sit <> stand (Mod independent to supervision when tired to stand min A to sit)  Standing mini squats 10x2 with rw assist,  Gait with rw 106 ft, 104 ft with CGA +1 (95 % - 98% SpO2 room air), mod cues for staying closer  to rw, increasing hip and knee flexion to promote foot clearance, and increase step length.   Seated knee extension stretch bilateral LE 2 min,   Seated manual gastroc stretch 1 min each LE  Standing bilateral scap retract (min A to maintain standing) resisting red band 10x2  Improved exercise technique, movement at target joints, use of target muscles after mod verbal, visual, tactile cues.    Vital signs monitored.   Pt able to make it to physical therapy session mid afternoon. Pt able to ambulate 106 ft today (6 ft more compared to previous sessions), and able to perform sit to stand with mod independent today. Pt also demonstrates more energy today.                                PT Education - 01/10/15 1720    Education provided Yes   Education Details ther-ex   Starwood HotelsPerson(s) Educated Patient    Methods Explanation;Demonstration;Tactile cues;Verbal cues   Comprehension Verbalized understanding;Returned demonstration             PT Long Term Goals - 01/05/15 1902    PT LONG TERM GOAL #1   Title Patient will demonstrate improved TUG score of  < 1 min  to decrease fall risk   Baseline 1 min 19 sec   Time 8   Period Weeks   Status On-going   PT LONG TERM GOAL #2   Title Patient will be able to ambulate with LRAD > 5 minutes without rest for improved ability to ambulate around his house.    Baseline 1-2 min   Time 8   Period Weeks   Status On-going   PT LONG TERM GOAL #3   Title Patient will demonstrate 5x STS in less than 1 min for improved strength and mobility at home.    Time 8   Period Weeks   Status On-going   PT LONG TERM GOAL #4   Title Patient will improve LEFS score to > 25/80 demonstrating improved function.   Baseline 19/80   Time 8   Period Weeks   Status On-going               Plan - 01/10/15 1532    Clinical Impression Statement Pt able to make it to physical therapy session mid afternoon. Pt able to ambulate 106 ft today (6 ft more compared to previous sessions), and able to perform sit to stand with mod independent today. Pt also demonstrates more energy today.    Pt will benefit from skilled therapeutic intervention in order to improve on the following deficits Decreased activity tolerance;Decreased balance;Impaired flexibility;Decreased mobility   Rehab Potential Fair   PT Frequency 2x / week   PT Duration 8 weeks   PT Treatment/Interventions ADLs/Self Care Home Management;Gait training;Therapeutic activities;Therapeutic exercise;Balance training;Wheelchair mobility training;Patient/family education;Passive range of motion   PT Next Visit Plan Strengthening, gait, stretching of LEs. balance   Consulted and Agree with Plan of Care Patient;Family member/caregiver   Family Member Consulted one of pt daughters        Problem List Patient  Active Problem List   Diagnosis Date Noted  . 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 Discoloration of skin of foot-Left 10/13/2013  . PAD (peripheral artery disease) (HCC)   . TIA (transient ischemic attack)   . Edema   . Hyperlipidemia   . Syncope   . CAD (coronary artery disease)   .  Venous insufficiency   . Carotid artery disease (HCC)   . Elevated CPK   . Popliteal aneurysm (HCC)   . Speech problem   . Hx of CABG   . Ejection fraction   . Prominent abdominal aortic pulsation   . Intracranial hemorrhage (HCC)   . Bradycardia     Loralyn Freshwater PT, DPT   01/10/2015, 5:21 PM  [facility] PHYSICAL AND SPORTS MEDICINE [address] [place], [address] Phone: [phone]   Fax:  [phone]  Name: NICKOLIS DIEL MRN: [id number] Date of Birth: 1921/08/06

## 2015-01-12 ENCOUNTER — Ambulatory Visit: Payer: Medicare Other

## 2015-01-12 VITALS — BP 130/78 | HR 77

## 2015-01-12 DIAGNOSIS — R531 Weakness: Secondary | ICD-10-CM

## 2015-01-12 NOTE — Therapy (Signed)
[facility] [address] [place], [place], [address] Phone: [phone]   Fax:  [phone]  Physical Therapy Treatment  Patient Details  Name: [name] [name] MRN: [id number] Date of Birth: 01/11/1922 No Data Recorded  Encounter Date: 01/12/2015      PT End of Session - 01/12/15 1618    Visit Number 12   Number of Visits 17   Date for PT Re-Evaluation 01/30/15   Authorization Type 2   Authorization Time Period of 10   PT Start Time 1615   PT Stop Time 1702   PT Time Calculation (min) 47 min   Equipment Utilized During Treatment Gait belt   Activity Tolerance Patient tolerated treatment well;No increased pain;Patient limited by fatigue   Behavior [facility] Therapy [facility] for tasks assessed/performed      Past Medical History  Diagnosis Date  . CAD (coronary artery disease)     nuclear 01/2008, no ischemia, EF 60%, distal anterior scar  . Hyperlipidemia   . Syncope     .remote..no etiology  /  syncope 11/2008.. from acute leg pain  . Venous insufficiency     support hose  . Carotid artery disease (HCC)     doppler 05/2009,  0-39% bilateral / doppler 06/2010 OK  . Elevated CPK     11/23/08.Marland Kitchen.Marland Kitchen.probably from debris from popliteal aneurysm  . Popliteal aneurysm (HCC)     .repair..11/25/2008  Dr. Darrick PennaFields  . Speech problem     Neurologic event with brief infusion and speech difficulty.... January, 2011.... being assessed by neurology  February, 2011  . Hx of CABG     1998  . Ejection fraction     EF 45%, echo,05/2009,apical hypo,  ?? posterior and lateral hypo  . Prominent abdominal aortic pulsation     doppler normal  . Intracranial hemorrhage (HCC)     Two small subependymal bleeds 06/2010,,ASA held  . Bradycardia     06/2010  . Edema     Ankle edema August, 2012  . TIA (transient ischemic attack)     Brief episode of confusion and speech difficulty, January, 2012  /  recurrent confusion speech difficulty September,  2012  . PAD (peripheral artery disease) (HCC)     Above the knee to below the knee popliteal bypass,, right  //    Doppler,  July, 2013, patent  . Fall at home July 2015    Past Surgical History  Procedure Laterality Date  . Coronary artery bypass graft  1998  . Popliteal bypass  with reversed greater saphenous vein from right leg.      Filed Vitals:   01/12/15 1620  BP: 130/78  Pulse: 77  SpO2: 95%    Visit Diagnosis:  Weakness      Subjective Assessment - 01/12/15 1619    Subjective Pt states that he is doing good. Also ate lunch (ham sandwhich)   Patient is accompained by: Family member   Limitations Standing;Walking;House hold activities   Patient Stated Goals "To be able to walk again."   Currently in Pain? No/denies        Objectives:   There-ex  Directed patient with sit <> stand min to mod A 5x from wc and then throughout session  Seated bilateral knee extension stretch 2 min,   Seated hip flexion 10x3 each LE  97% SpO2  Gait with RW 107 ft, 103 ft  CGA +1 (97% to 98% SpO2 afterwards). Pt needed cues to avoid  obstacles on the R  Standing bilateral scap retract (min A to maintain standing) resisting red band 10x2  Seated bilateral gastroc stretch 1 min each LE  Vitals monitored.     Improved exercise technique, movement at target joints, use of target muscles after mod verbal, visual, tactile cues.     Able to ambulate a little over 100 ft consistently. Pt however has difficulty with obstacle negotiation and needs cues to avoid them. Decreased ability to support himself with bilateral LE R > L with increased gait distances. Pt also needs cues to stay closer to his rw, increase hip and knee flexion to promote clearance during gait. Needed min to mod assist with sit <> stand exercises today.                       PT Education - 01/12/15 1626    Education provided Yes   Education Details ther-ex   [employer]) Educated Patient    Methods Explanation;Demonstration;Tactile cues;Verbal cues   Comprehension Verbalized understanding;Returned demonstration             PT Long Term Goals - 01/05/15 1902    PT LONG TERM GOAL #1   Title Patient will demonstrate improved TUG score of  < 1 min  to decrease fall risk   Baseline 1 min 19 sec   Time 8   Period Weeks   Status On-going   PT LONG TERM GOAL #2   Title Patient will be able to ambulate with LRAD > 5 minutes without rest for improved ability to ambulate around his house.    Baseline 1-2 min   Time 8   Period Weeks   Status On-going   PT LONG TERM GOAL #3   Title Patient will demonstrate 5x STS in less than 1 min for improved strength and mobility at home.    Time 8   Period Weeks   Status On-going   PT LONG TERM GOAL #4   Title Patient will improve LEFS score to > 25/80 demonstrating improved function.   Baseline 19/80   Time 8   Period Weeks   Status On-going               Plan - 01/12/15 1626    Clinical Impression Statement Able to ambulate a little over 100 ft consistently. Pt however has difficulty with obstacle negotiation and needs cues to avoid them. Decreased ability to support himself with bilateral LE R > L with increased gait distances. Pt also needs cues to stay closer to his rw, increase hip and knee flexion to promote clearance during gait. Needed min to mod assist with sit <> stand exercises today.    Pt will benefit from skilled therapeutic intervention in order to improve on the following deficits Decreased activity tolerance;Decreased balance;Impaired flexibility;Decreased mobility   Rehab Potential Fair   PT Frequency 2x / week   PT Duration 8 weeks   PT Treatment/Interventions ADLs/Self Care Home Management;Gait training;Therapeutic activities;Therapeutic exercise;Balance training;Wheelchair mobility training;Patient/family education;Passive range of motion   PT Next Visit Plan Strengthening, gait, stretching of LEs. balance    Consulted and Agree with Plan of Care Patient;Family member/caregiver   Family Member Consulted one of pt daughters        Problem List Patient Active Problem List   Diagnosis Date Noted  . 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 Discoloration of skin of foot-Left 10/13/2013  . PAD (peripheral artery disease) (HCC)   . TIA (transient  ischemic attack)   . Edema   . Hyperlipidemia   . Syncope   . CAD (coronary artery disease)   . Venous insufficiency   . Carotid artery disease (HCC)   . Elevated CPK   . Popliteal aneurysm (HCC)   . Speech problem   . Hx of CABG   . Ejection fraction   . Prominent abdominal aortic pulsation   . Intracranial hemorrhage (HCC)   . Bradycardia     Loralyn Freshwater PT, DPT   01/12/2015, 7:34 PM  [facility] PHYS[facility] AND SPORTS MEDICINE [address] [place], [address] Phone: [phone]   Fax:  [phone]  Name: [name] [name] MRN: [id number] Date of Birth: 1921/04/25

## 2015-01-17 ENCOUNTER — Ambulatory Visit: Payer: Medicare Other

## 2015-01-17 VITALS — BP 134/63 | HR 69

## 2015-01-17 DIAGNOSIS — R531 Weakness: Secondary | ICD-10-CM | POA: Diagnosis not present

## 2015-01-17 NOTE — Therapy (Signed)
[facility]IC[facility] [address] [place], [place], [address] Phone: [phone]   Fax:  [phone]  Physical Therapy Treatment  Patient Details  Name: [name] [name] MRN: [id number] Date of Birth: 08/20/1921 No Data Recorded  Encounter Date: 01/17/2015      PT End of Session - 01/17/15 1643    Visit Number 13   Number of Visits 17   Date for PT Re-Evaluation 01/30/15   Authorization Type 4   Authorization Time Period of 10   PT Start Time 1643   PT Stop Time 1733   PT Time Calculation (min) 50 min   Equipment Utilized During Treatment Gait belt   Activity Tolerance Patient tolerated treatment well;No increased pain;Patient limited by fatigue   Behavior [facility] for [facility] assessed/performed      Past Medical History  Diagnosis Date  . CAD (coronary artery disease)     nuclear 01/2008, no ischemia, EF 60%, distal anterior scar  . Hyperlipidemia   . Syncope     .remote..no etiology  /  syncope 11/2008.. from acute leg pain  . Venous insufficiency     support hose  . Carotid artery disease (HCC)     doppler 05/2009,  0-39% bilateral / doppler 06/2010 OK  . Elevated CPK     11/23/08.Marland Kitchen.Marland Kitchen.probably from debris from popliteal aneurysm  . Popliteal aneurysm (HCC)     .repair..11/25/2008  Dr. Darrick PennaFields  . Speech problem     Neurologic event with brief infusion and speech difficulty.... January, 2011.... being assessed by neurology  February, 2011  . Hx of CABG     1998  . Ejection fraction     EF 45%, echo,05/2009,apical hypo,  ?? posterior and lateral hypo  . Prominent abdominal aortic pulsation     doppler normal  . Intracranial hemorrhage (HCC)     Two small subependymal bleeds 06/2010,,ASA held  . Bradycardia     06/2010  . Edema     Ankle edema August, 2012  . TIA (transient ischemic attack)     Brief episode of confusion and speech difficulty, January, 2012  /  recurrent confusion speech difficulty September,  2012  . PAD (peripheral artery disease) (HCC)     Above the knee to below the knee popliteal bypass,, right  //    Doppler,  July, 2013, patent  . Fall at home July 2015    Past Surgical History  Procedure Laterality Date  . Coronary artery bypass graft  1998  . Popliteal bypass  with reversed greater saphenous vein from right leg.      Filed Vitals:   01/17/15 1646  BP: 134/63  Pulse: 69  SpO2: 96%    Visit Diagnosis:  Weakness      Subjective Assessment - 01/17/15 1648    Subjective Pt daughter states R biceps is sore today. Pt states it might be due to pulling with his R arm for transfers. Pt states eating a sandwhich for lunch and drinking water. Also states R lateral thigh soreness which began a few days ago.    Patient is accompained by: Family member   Limitations Standing;Walking;House hold activities   Patient Stated Goals "To be able to walk again."   Currently in Pain? No/denies   Multiple Pain Sites No       R lateral thigh soreness reproduced with palpation and resistance to quadriceps muscle (-) Homan's sign  Objectives:  There-ex: Directed patient with sit <>  stand throughout session from wc mod A.   Standing mini squats 10x2 with bilateral UE assist from rw  Gait using rw 100 ft , then 119 ft with caregiver training (one of daughters) on using SpO2 reader and hand and body placement using gait belt. SpO2 97% 98 % room air after gait. Pt daughter was educated to use SpO2 monitor to check vital signs prior to walking 50 ft to 70 ft at home. Can walk when at 95% or higher. Might have to warm hands to promote blood flow if cold to measure O2 levels. Pt daughter demonstrated and verbalized understanding.   Seated R and L calf stretch 1 min manually by therapist  Seated hip flexion 10x3 each LE   Seated bilateral knee extension stretch 2 min   Pt was recommended to perform seated knee extension and ankle DF/PF exercises throughout the day to promote  movement in his LE. Pt demonstrated and verbalized understanding.   Improved exercise technique, movement at target joints, use of target muscles after mod verbal, visual, tactile cues.      Improved gait distance today to 119 ft using rw. Pt needed consistent cues for increasing hip and knee flexion to promote clearance, increase step length as well as to stay closer to his rw for safety. Patient also demonstrates difficulty with performing sit <> stand transfers from wc, needing mod assist and cues to bring his body weight forward onto his feet. Pt demonstrates tendency for backward weight shifting.                    PT Education - 01/17/15 1652    Education provided Yes   Education Details ther-ex   [employer]) Educated Patient   Methods Explanation;Demonstration;Tactile cues;Verbal cues   Comprehension Verbalized understanding;Returned demonstration             PT Long Term Goals - 01/05/15 1902    PT LONG TERM GOAL #1   Title Patient will demonstrate improved TUG score of  < 1 min  to decrease fall risk   Baseline 1 min 19 sec   Time 8   Period Weeks   Status On-going   PT LONG TERM GOAL #2   Title Patient will be able to ambulate with LRAD > 5 minutes without rest for improved ability to ambulate around his house.    Baseline 1-2 min   Time 8   Period Weeks   Status On-going   PT LONG TERM GOAL #3   Title Patient will demonstrate 5x STS in less than 1 min for improved strength and mobility at home.    Time 8   Period Weeks   Status On-going   PT LONG TERM GOAL #4   Title Patient will improve LEFS score to > 25/80 demonstrating improved function.   Baseline 19/80   Time 8   Period Weeks   Status On-going               Plan - 01/17/15 1653    Clinical Impression Statement Improved gait distance today to 119 ft using rw. Pt needed consistent cues for increasing hip and knee flexion to promote clearance, increase step length as well as to stay  closer to his rw for safety. Patient also demonstrates difficulty with performing sit <> stand transfers from wc, needing mod assist and cues to bring his body weight forward onto his feet. Pt demonstrates tendency for backward weight shifting.   Pt will benefit from skilled  therapeutic intervention in order to improve on the following deficits Decreased activity tolerance;Decreased balance;Impaired flexibility;Decreased mobility   Rehab Potential Fair   PT Frequency 2x / week   PT Duration 8 weeks   PT Treatment/Interventions ADLs/Self Care Home Management;Gait training;Therapeutic activities;Therapeutic exercise;Balance training;Wheelchair mobility training;Patient/family education;Passive range of motion   PT Next Visit Plan Strengthening, gait, stretching of LEs. balance   Consulted and Agree with Plan of Care Patient;Family member/caregiver   Family Member Consulted one of pt daughters        Problem List Patient Active Problem List   Diagnosis Date Noted  . 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 Discoloration of skin of foot-Left 10/13/2013  . PAD (peripheral artery disease) (HCC)   . TIA (transient ischemic attack)   . Edema   . Hyperlipidemia   . Syncope   . CAD (coronary artery disease)   . Venous insufficiency   . Carotid artery disease (HCC)   . Elevated CPK   . Popliteal aneurysm (HCC)   . Speech problem   . Hx of CABG   . Ejection fraction   . Prominent abdominal aortic pulsation   . Intracranial hemorrhage (HCC)   . Bradycardia    Loralyn Freshwater PT, DPT   01/17/2015, 7:18 PM  [place] [facility] PHYSICAL AND SPORTS MEDICINE [address] [place], [address] Phone: [phone]   Fax:  [phone]  Name: [name] [name] MRN: [id number] Date of Birth: 03-21-1921

## 2015-01-19 ENCOUNTER — Ambulatory Visit: Payer: Medicare Other

## 2015-01-19 DIAGNOSIS — R531 Weakness: Secondary | ICD-10-CM | POA: Diagnosis not present

## 2015-01-19 NOTE — Therapy (Signed)
[facility]IC[facility] [address], [place], [address] Phone: [phone]   Fax:  [phone]  Physical Therapy Treatment  Patient Details  Name: [name] [name] MRN: [id number] Date of Birth: 1921-04-26 No Data Recorded  Encounter Date: 01/19/2015      PT End of Session - 01/19/15 1653    Visit Number 14   Number of Visits 17   Date for PT Re-Evaluation 01/30/15   Authorization Type 5   Authorization Time Period of 10   PT Start Time 1650  PT started prior to sign in   PT Stop Time 1732   PT Time Calculation (min) 42 min   Equipment Utilized During Treatment Gait belt   Activity Tolerance Patient tolerated treatment well;No increased pain;Patient limited by fatigue   Behavior During Therapy [facility] for tasks assessed/performed      Past Medical History  Diagnosis Date  . CAD (coronary artery disease)     nuclear 01/2008, no ischemia, EF 60%, distal anterior scar  . Hyperlipidemia   . Syncope     .remote..no etiology  /  syncope 11/2008.. from acute leg pain  . Venous insufficiency     support hose  . Carotid artery disease (HCC)     doppler 05/2009,  0-39% bilateral / doppler 06/2010 OK  . Elevated CPK     11/23/08.Marland KitchenMarland Kitchenprobably from debris from popliteal aneurysm  . Popliteal aneurysm (HCC)     .repair..11/25/2008  Dr. Darrick Penna  . Speech problem     Neurologic event with brief infusion and speech difficulty.... January, 2011.... being assessed by neurology  February, 2011  . Hx of CABG     1998  . Ejection fraction     EF 45%, echo,05/2009,apical hypo,  ?? posterior and lateral hypo  . Prominent abdominal aortic pulsation     doppler normal  . Intracranial hemorrhage (HCC)     Two small subependymal bleeds 06/2010,,ASA held  . Bradycardia     06/2010  . Edema     Ankle edema August, 2012  . TIA (transient ischemic attack)     Brief episode of confusion and speech difficulty, January, 2012  /  recurrent  confusion speech difficulty September, 2012  . PAD (peripheral artery disease) (HCC)     Above the knee to below the knee popliteal bypass,, right  //    Doppler,  July, 2013, patent  . Fall at home July 2015    Past Surgical History  Procedure Laterality Date  . Coronary artery bypass graft  1998  . Popliteal bypass  with reversed greater saphenous vein from right leg.      There were no vitals filed for this visit.  Visit Diagnosis:  Weakness      Subjective Assessment - 01/19/15 1654    Subjective Pt states eating lunch. No pain or discomfort anywhere. No soreness.    Patient is accompained by: Family member   Limitations Standing;Walking;House hold activities   Patient Stated Goals "To be able to walk again."   Currently in Pain? No/denies   Multiple Pain Sites No       Objectives:  There-ex  SpO2 97% room air  Seated hip flexion 10x3 each LE   Sit <> stand throughout session mod I   Gait 117 ft with rw, CGA +1 (another daughter trained with gait with walking father), SpO2 97% afterwards.  Then 117 ft with CGA +1 SpO2 96%  Seated bilateral knee extension stretch  x 2 min  Then with soft tissue mobilization to distal hamstrings L LE  Followed by quad sets with LE propped on stool 10x5 seconds for 2 sets. L knee extension = R knee extension afterwards  Improved exercise technique, movement at target joints, use of target muscles after mod verbal, visual, tactile cues.                             PT Education - 01/19/15 1655    Education provided Yes   Education Details ther-ex   [employer]) Educated Patient   Methods Explanation;Demonstration;Tactile cues;Verbal cues   Comprehension Verbalized understanding;Returned demonstration             PT Long Term Goals - 01/05/15 1902    PT LONG TERM GOAL #1   Title Patient will demonstrate improved TUG score of  < 1 min  to decrease fall risk   Baseline 1 min 19 sec   Time 8   Period  Weeks   Status On-going   PT LONG TERM GOAL #2   Title Patient will be able to ambulate with LRAD > 5 minutes without rest for improved ability to ambulate around his house.    Baseline 1-2 min   Time 8   Period Weeks   Status On-going   PT LONG TERM GOAL #3   Title Patient will demonstrate 5x STS in less than 1 min for improved strength and mobility at home.    Time 8   Period Weeks   Status On-going   PT LONG TERM GOAL #4   Title Patient will improve LEFS score to > 25/80 demonstrating improved function.   Baseline 19/80   Time 8   Period Weeks   Status On-going               Plan - 01/19/15 1656    Clinical Impression Statement Pt able to ambulate 117 ft 2x today as well as able to stand up from his wc 2x mod I, min A to sit. Patient making progress with gait distance and tolerance. Needs consistent verbal cues to stay closer to rw, increase foot clearance, and watch out for obstacles.    Pt will benefit from skilled therapeutic intervention in order to improve on the following deficits Decreased activity tolerance;Decreased balance;Impaired flexibility;Decreased mobility   Rehab Potential Fair   PT Frequency 2x / week   PT Duration 8 weeks   PT Treatment/Interventions ADLs/Self Care Home Management;Gait training;Therapeutic activities;Therapeutic exercise;Balance training;Wheelchair mobility training;Patient/family education;Passive range of motion   PT Next Visit Plan Strengthening, gait, stretching of LEs. balance   Consulted and Agree with Plan of Care Patient;Family member/caregiver   Family Member Consulted one of pt daughters        Problem List Patient Active Problem List   Diagnosis Date Noted  . 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 Discoloration of skin of foot-Left 10/13/2013  . PAD (peripheral artery disease) (HCC)   . TIA (transient ischemic attack)   . Edema   . Hyperlipidemia   . Syncope   . CAD (coronary artery disease)   . Venous  insufficiency   . Carotid artery disease (HCC)   . Elevated CPK   . Popliteal aneurysm (HCC)   . Speech problem   . Hx of CABG   . Ejection fraction   . Prominent abdominal aortic pulsation   . Intracranial hemorrhage (HCC)   . Bradycardia  Loralyn FreshwaterMiguel Neema Fluegge PT, DPT   01/19/2015, 5:39 PM  [facility] [address] [place], [place], [address] Phone: [phone]   Fax:  [phone]  Name: Darden Palmerlbert L Burston MRN: [id number] Date of Birth: 07/31/1921

## 2015-01-23 ENCOUNTER — Ambulatory Visit: Payer: Medicare Other

## 2015-01-23 DIAGNOSIS — R531 Weakness: Secondary | ICD-10-CM | POA: Diagnosis not present

## 2015-01-23 NOTE — Therapy (Signed)
[place] [facility]IC[facility] [address], [place], [address] Phone: [phone]   Fax:  [phone]  Physical Therapy Treatment  Patient Details  Name: [name] [name] MRN: [id number] Date of Birth: 1921/12/05 No Data Recorded  Encounter Date: 01/23/2015      PT End of Session - 01/23/15 1602    Visit Number 15   Number of Visits 17   Date for PT Re-Evaluation 01/30/15   Authorization Type 6   Authorization Time Period of 10   PT Start Time 1602   PT Stop Time 1647   PT Time Calculation (min) 45 min   Equipment Utilized During Treatment Gait belt   Activity Tolerance Patient tolerated treatment well;No increased pain;Patient limited by fatigue   Behavior During Therapy [facility] for tasks assessed/performed      Past Medical History  Diagnosis Date  . CAD (coronary artery disease)     nuclear 01/2008, no ischemia, EF 60%, distal anterior scar  . Hyperlipidemia   . Syncope     .remote..no etiology  /  syncope 11/2008.. from acute leg pain  . Venous insufficiency     support hose  . Carotid artery disease (HCC)     doppler 05/2009,  0-39% bilateral / doppler 06/2010 OK  . Elevated CPK     11/23/08.Marland KitchenMarland Kitchenprobably from debris from popliteal aneurysm  . Popliteal aneurysm (HCC)     .repair..11/25/2008  Dr. Darrick Penna  . Speech problem     Neurologic event with brief infusion and speech difficulty.... January, 2011.... being assessed by neurology  February, 2011  . Hx of CABG     1998  . Ejection fraction     EF 45%, echo,05/2009,apical hypo,  ?? posterior and lateral hypo  . Prominent abdominal aortic pulsation     doppler normal  . Intracranial hemorrhage (HCC)     Two small subependymal bleeds 06/2010,,ASA held  . Bradycardia     06/2010  . Edema     Ankle edema August, 2012  . TIA (transient ischemic attack)     Brief episode of confusion and speech difficulty, January, 2012  /  recurrent confusion speech difficulty September,  2012  . PAD (peripheral artery disease) (HCC)     Above the knee to below the knee popliteal bypass,, right  //    Doppler,  July, 2013, patent  . Fall at home July 2015    Past Surgical History  Procedure Laterality Date  . Coronary artery bypass graft  1998  . Popliteal bypass  with reversed greater saphenous vein from right leg.      There were no vitals filed for this visit.  Visit Diagnosis:  Weakness      Subjective Assessment - 01/23/15 1609    Subjective Pt states he ate some vegetables and protein in his lunch today. No pain or discomfort currently.   Patient is accompained by: Family member   Limitations Standing;Walking;House hold activities   Patient Stated Goals "To be able to walk again."   Multiple Pain Sites No        Objectives:  There-ex  SpO2 96% room air  Seated hip flexion 10x3 each LE   Sit <> stand 5x2 then throughout session mod A to min A to mod I, cues to bring center of gravity over feet  Seated bilateral knee extension stretch x 2 min  Then with soft tissue mobilization to distal hamstrings L LE  Followed by quad sets with  LE propped on stool 10x5 seconds for 2 sets to promote knee extension ROM  SpO2 98%  Gait with RW 68 ft, and 90 ft CGA + 1 (second person pushing wc) SpO2 96% to  97% afterwards. Cues for increasing hip flexion to promote foot clearance, staying closer to rw, watching out for obstacles.   BP 140/72, HR74  Bilateral gastroc stretch x 1 min.     Improved exercise technique, movement at target joints, use of target muscles after mod verbal, visual, tactile cues.       Improved ability to perform sit <> stand when pt was able to bring his center of gravity over his feet. Pt appears to be more tired today during gait compared to previous sessions.                             PT Education - 01/23/15 1609    Education provided Yes   Education Details ther-ex   [employer]) Educated Patient    Methods Explanation;Demonstration;Tactile cues;Verbal cues   Comprehension Verbalized understanding;Returned demonstration             PT Long Term Goals - 01/05/15 1902    PT LONG TERM GOAL #1   Title Patient will demonstrate improved TUG score of  < 1 min  to decrease fall risk   Baseline 1 min 19 sec   Time 8   Period Weeks   Status On-going   PT LONG TERM GOAL #2   Title Patient will be able to ambulate with LRAD > 5 minutes without rest for improved ability to ambulate around his house.    Baseline 1-2 min   Time 8   Period Weeks   Status On-going   PT LONG TERM GOAL #3   Title Patient will demonstrate 5x STS in less than 1 min for improved strength and mobility at home.    Time 8   Period Weeks   Status On-going   PT LONG TERM GOAL #4   Title Patient will improve LEFS score to > 25/80 demonstrating improved function.   Baseline 19/80   Time 8   Period Weeks   Status On-going               Plan - 01/23/15 1613    Clinical Impression Statement Improved ability to perform sit <> stand when pt was able to bring his center of gravity over his feet. Pt appears to be more tired today during gait compared to previous sessions.    Pt will benefit from skilled therapeutic intervention in order to improve on the following deficits Decreased activity tolerance;Decreased balance;Impaired flexibility;Decreased mobility   Rehab Potential Fair   PT Frequency 2x / week   PT Duration 8 weeks   PT Treatment/Interventions ADLs/Self Care Home Management;Gait training;Therapeutic activities;Therapeutic exercise;Balance training;Wheelchair mobility training;Patient/family education;Passive range of motion   PT Next Visit Plan Strengthening, gait, stretching of LEs. balance   Consulted and Agree with Plan of Care Patient;Family member/caregiver   Family Member Consulted one of pt daughters        Problem List Patient Active Problem List   Diagnosis Date Noted  . Popliteal  artery aneurysm (HCC) 10/13/2013  . Discoloration of skin of foot-Left 10/13/2013  . PAD (peripheral artery disease) (HCC)   . TIA (transient ischemic attack)   . Edema   . Hyperlipidemia   . Syncope   . CAD (coronary artery disease)   .  Venous insufficiency   . Carotid artery disease (HCC)   . Elevated CPK   . Popliteal aneurysm (HCC)   . Speech problem   . Hx of CABG   . Ejection fraction   . Prominent abdominal aortic pulsation   . Intracranial hemorrhage (HCC)   . Bradycardia    [name] [name] PT, DPT   01/23/2015, 5:58 PM  [facility] [address] , [place], [address] Phone: [phone]   Fax:  [phone]  Name: [name] [name] MRN: [id number] Date of Birth: 07/22/1921

## 2015-01-31 ENCOUNTER — Ambulatory Visit: Payer: Medicare Other

## 2015-01-31 VITALS — HR 78

## 2015-01-31 DIAGNOSIS — R531 Weakness: Secondary | ICD-10-CM

## 2015-01-31 DIAGNOSIS — R262 Difficulty in walking, not elsewhere classified: Secondary | ICD-10-CM

## 2015-01-31 NOTE — Therapy (Signed)
[facility] [address], [place], [address] Phone: [phone]   Fax:  [phone]  Physical Therapy Treatment And Progress Report (12/05/2014 - 01/31/2015)  Patient Details  Name: [name] [name] MRN: [id number] Date of Birth: May 30, 1921 Referring Provider: Olevia Perches, DO  Encounter Date: 01/31/2015      PT End of Session - 01/31/15 1646    Visit Number 16   Number of Visits 27   Date for PT Re-Evaluation 03/16/15   Authorization Type 1   Authorization Time Period of 10   PT Start Time 1646   PT Stop Time 1729   PT Time Calculation (min) 43 min   Equipment Utilized During Treatment Gait belt   Activity Tolerance Patient tolerated treatment well;No increased pain;Patient limited by fatigue   Behavior During Therapy [facility] for tasks assessed/performed      Past Medical History  Diagnosis Date  . CAD (coronary artery disease)     nuclear 01/2008, no ischemia, EF 60%, distal anterior scar  . Hyperlipidemia   . Syncope     .remote..no etiology  /  syncope 11/2008.. from acute leg pain  . Venous insufficiency     support hose  . Carotid artery disease (HCC)     doppler 05/2009,  0-39% bilateral / doppler 06/2010 OK  . Elevated CPK     11/23/08.Marland KitchenMarland Kitchenprobably from debris from popliteal aneurysm  . Popliteal aneurysm (HCC)     .repair..11/25/2008  Dr. Darrick Penna  . Speech problem     Neurologic event with brief infusion and speech difficulty.... January, 2011.... being assessed by neurology  February, 2011  . Hx of CABG     1998  . Ejection fraction     EF 45%, echo,05/2009,apical hypo,  ?? posterior and lateral hypo  . Prominent abdominal aortic pulsation     doppler normal  . Intracranial hemorrhage (HCC)     Two small subependymal bleeds 06/2010,,ASA held  . Bradycardia     06/2010  . Edema     Ankle edema August, 2012  . TIA (transient ischemic attack)     Brief episode of confusion and speech  difficulty, January, 2012  /  recurrent confusion speech difficulty September, 2012  . PAD (peripheral artery disease) (HCC)     Above the knee to below the knee popliteal bypass,, right  //    Doppler,  July, 2013, patent  . Fall at home July 2015    Past Surgical History  Procedure Laterality Date  . Coronary artery bypass graft  1998  . Popliteal bypass  with reversed greater saphenous vein from right leg.      Filed Vitals:   01/31/15 1650  Pulse: 78  SpO2: 98%    Visit Diagnosis:  Weakness - Plan: PT plan of care cert/re-cert  Difficulty walking - Plan: PT plan of care cert/re-cert      Subjective Assessment - 01/31/15 1650    Subjective Pt daughter states pt is recovering from a cold. Pt daughter also states that pt legs tend to get week when he gets sick. Pt states no pain whatsover.    Patient is accompained by: Family member   Limitations Standing;Walking;House hold activities   Patient Stated Goals "To be able to walk again."   Currently in Pain? No/denies   Multiple Pain Sites No       Objectives:  There-ex  Directed patient with standing up from chair, walking 10 ft, then walking back  10 ft and sitting down 2 sets, Sit <> stand from wc 5x, 2x, 1x and throughout session with level of assist varying from mod independent to mod assist to min assist (cues for placing center of gravity over feet) Seated bilateral knee extension stretch 2 min Seated calf stretch 1 min each LE Static standing balance 5 seconds, 20 seconds, 30 seconds without UE assist but CGA from PT (cues for maintaining center of gravity over feet) Seated hip flexion 10x3 each LE,  Reviewed progress/current status with TUG and 5 times Sit to Stand with pt and daughter.   Improved exercise technique, movement at target joints, use of target muscles after mod verbal, visual, tactile cues.     TUG: 1:43 (mod I to stand but needed multiple attempts; cues for staying closer to rw with gait); 1:22  (SpO2 98 % room air)  5 times sit <> stand 2:34      [facility] PT Assessment - 01/31/15 1814    Assessment   Referring Provider Olevia Perches, DO   Observation/Other Assessments   Observations TUG: 1 minute  43 seconds,   1 minute 22 seconds (mod independent to stand);  5 Times Sit to Stand 2 min 34 seconds (min to mod assist)    Lower Extremity Functional Scale  13/80 (daughter [name] [name] helped pt fill out sheet)       Patient has demonstrated improved times with TUG as well as being able to perform TUG during today's session with mod independent to stand (needed min to mod A to stand last progress report) since last progress report on 01/05/15 as well as improved ability to ambulate longer distances, walking 117 ft on 01/19/15 with CGA +1, compared to 70 ft total in 30 ft intervals during initial evaluation. Pt needs consistent verbal cues to stay closer to his rw, watch out for obstacles, increase hip and knee flexion and increase step length when walking.  Pt currently demonstrates difficulty with sit <> stand transfers multiple times secondary to weakness and difficulty placing his center of gravity over his feet, needing multiple attempts at times and increased time to perform transfer. Pt also able to perform transfer with mod independent when able to utilize proper technique and when pt does not feel as weak. Overall, patient making progress towards gait. Patient will benefit from continued skilled physical therapy services to promote LE strength, improve ability to perform transfers and ambulate longer distances, and to decrease fall risk.                        PT Education - 01/31/15 1811    Education provided Yes   Education Details ther-ex, progress/current status with TUG and 5 Times Sit to Stand and gait   Person(s) Educated Patient;Child(ren)  daughter   Methods Explanation;Demonstration;Tactile cues;Verbal cues   Comprehension Verbalized understanding;Returned  demonstration;Verbal cues required             PT Long Term Goals - 01/31/15 1750    PT LONG TERM GOAL #1   Title Patient will demonstrate improved TUG score of  1 min  to decrease fall risk   Baseline 1 min 19 sec   Time 6   Period Weeks   Status Revised   PT LONG TERM GOAL #2   Title Patient will be able to ambulate with rw at least 130 ft with CGA +1 (second person pushing wheel chair) without rest to promote ability to ambulate around the house.  Baseline --   Time 6   Period Weeks   Status Revised   PT LONG TERM GOAL #3   Title Patient will demonstrate 5x STS in 1 min 45 seconds for improved strength and mobility at home.    Time 6   Period Weeks   Status Revised   PT LONG TERM GOAL #4   Title Patient will improve LEFS score to > 25/80 demonstrating improved function.   Baseline 19/80   Time 8   Period Weeks   Status On-going               Plan - 02/10/15 1647    Clinical Impression Statement Patient has demonstrated improved times with TUG as well as being able to perform TUG during today's session with mod independent to stand (needed min to mod A to stand last progress report) since last progress report on 01/05/15 as well as improved ability to ambulate longer distances, walking 117 ft on 01/19/15 with CGA +1, compared to 70 ft total in 30 ft intervals during initial evaluation. Pt needs consistent verbal cues to stay closer to his rw, watch out for obstacles, increase hip and knee flexion and increase step length when walking.  Pt currently demonstrates difficulty with sit <> stand transfers multiple times secondary to weakness and difficulty placing his center of gravity over his feet, needing multiple attempts at times and increased time to perform transfer. Pt also able to perform transfer with mod independent when able to utilize proper technique and when pt does not feel as weak. Overall, patient making progress towards gait. Patient will benefit from  continued skilled physical therapy services to promote LE strength, improve ability to perform transfers and ambulate longer distances, and to decrease fall risk.    Pt will benefit from skilled therapeutic intervention in order to improve on the following deficits Decreased activity tolerance;Decreased balance;Impaired flexibility;Decreased mobility   Rehab Potential Fair   PT Frequency 2x / week   PT Duration 6 weeks   PT Treatment/Interventions ADLs/Self Care Home Management;Gait training;Therapeutic activities;Therapeutic exercise;Balance training;Wheelchair mobility training;Patient/family education;Passive range of motion   PT Next Visit Plan Strengthening, gait, stretching of LEs. balance   Consulted and Agree with Plan of Care Patient;Family member/caregiver   Family Member Consulted one of pt daughters          G-Codes - Feb 10, 2015 1749    Functional Assessment Tool Used  TUG, gait, clinical presentation   Functional Limitation Mobility: Walking and moving around   Mobility: Walking and Moving Around Current Status [phone]) At least 60 percent but less than 80 percent impaired, limited or restricted   Mobility: Walking and Moving Around Goal Status [phone]) At least 40 percent but less than 60 percent impaired, limited or restricted      Problem List Patient Active Problem List   Diagnosis Date Noted  . 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 Discoloration of skin of foot-Left 10/13/2013  . PAD (peripheral artery disease) (HCC)   . TIA (transient ischemic attack)   . Edema   . Hyperlipidemia   . Syncope   . CAD (coronary artery disease)   . Venous insufficiency   . Carotid artery disease (HCC)   . Elevated CPK   . Popliteal aneurysm (HCC)   . Speech problem   . Hx of CABG   . Ejection fraction   . Prominent abdominal aortic pulsation   . Intracranial hemorrhage (HCC)   . Bradycardia    Thank you for  your referral.  Loralyn FreshwaterMiguel Laygo PT, DPT  01/31/2015, 6:25  PM  [facility] [facility] [address] [place], [place], [address] Phone: [phone]   Fax:  [phone]  Name: [name] [name] MRN: [id number] Date of Birth: 06/22/1921

## 2015-02-02 ENCOUNTER — Ambulatory Visit: Payer: Medicare Other

## 2015-02-07 ENCOUNTER — Ambulatory Visit: Payer: Medicare Other | Attending: Family Medicine

## 2015-02-07 DIAGNOSIS — R531 Weakness: Secondary | ICD-10-CM | POA: Diagnosis present

## 2015-02-07 DIAGNOSIS — R262 Difficulty in walking, not elsewhere classified: Secondary | ICD-10-CM | POA: Insufficient documentation

## 2015-02-07 NOTE — Therapy (Signed)
[place] [facility] [address], [place], [address] Phone: [phone]   Fax:  [phone]  Physical Therapy Treatment  Patient Details  Name: [name] [name] MRN: [id number] Date of Birth: 06-30-1921 Referring Provider: Olevia Perches, DO  Encounter Date: 02/07/2015      PT End of Session - 02/07/15 1615    Visit Number 17   Number of Visits 27   Date for PT Re-Evaluation 03/16/15   Authorization Type 2   Authorization Time Period of 10   PT Start Time 1615   PT Stop Time 1648   PT Time Calculation (min) 33 min   Equipment Utilized During Treatment Gait belt   Activity Tolerance Patient tolerated treatment well;No increased pain;Patient limited by fatigue   Behavior During Therapy [facility] for tasks assessed/performed      Past Medical History  Diagnosis Date  . CAD (coronary artery disease)     nuclear 01/2008, no ischemia, EF 60%, distal anterior scar  . Hyperlipidemia   . Syncope     .remote..no etiology  /  syncope 11/2008.. from acute leg pain  . Venous insufficiency     support hose  . Carotid artery disease (HCC)     doppler 05/2009,  0-39% bilateral / doppler 06/2010 OK  . Elevated CPK     11/23/08.Marland KitchenMarland Kitchenprobably from debris from popliteal aneurysm  . Popliteal aneurysm (HCC)     .repair..11/25/2008  Dr. Darrick Penna  . Speech problem     Neurologic event with brief infusion and speech difficulty.... January, 2011.... being assessed by neurology  February, 2011  . Hx of CABG     1998  . Ejection fraction     EF 45%, echo,05/2009,apical hypo,  ?? posterior and lateral hypo  . Prominent abdominal aortic pulsation     doppler normal  . Intracranial hemorrhage (HCC)     Two small subependymal bleeds 06/2010,,ASA held  . Bradycardia     06/2010  . Edema     Ankle edema August, 2012  . TIA (transient ischemic attack)     Brief episode of confusion and speech difficulty, January, 2012  /  recurrent confusion speech  difficulty September, 2012  . PAD (peripheral artery disease) (HCC)     Above the knee to below the knee popliteal bypass,, right  //    Doppler,  July, 2013, patent  . Fall at home July 2015    Past Surgical History  Procedure Laterality Date  . Coronary artery bypass graft  1998  . Popliteal bypass  with reversed greater saphenous vein from right leg.      Filed Vitals:   02/07/15 1618  SpO2: 98%    Visit Diagnosis:  Weakness  Difficulty walking      Subjective Assessment - 02/07/15 1618    Subjective Pt daughter states pt is recovering from pneumonia but doctor said that he is good enough for physical therapy. Follow up MD visit is on Thursday for his pneumonia (almost finished taking his medications)   Patient is accompained by: Family member   Limitations Standing;Walking;House hold activities   Patient Stated Goals "To be able to walk again."   Currently in Pain? No/denies   Multiple Pain Sites No      Objectives:  There-ex  Directed patient with seated hip flexion 10x3 each LE, Sit <> stand from wc mod A to CGA with cues to bring center of gravity over base of support 3x, and throughout session  Static standing balance with min A to CGA 2x  Manual gastroc stretch 1 min x 2 each LE,   96% SpO2 Gait with RW CGA + 1 32 ft (SpO2 92%,  increased to 97% after 2 minutes rest; fingers were cold which may play a factor with reading)  Seated bilateral knee extension stretch 2 minutes  Improved exercise technique, movement at target joints, use of target muscles after mod verbal, visual, tactile cues.    Light session today secondary to pt recovering from pneumonia and age. Decreased level of assist with sit <> stand with increased repetition and cues for bringing center of gravity over feet.                PT Education - 02/07/15 1622    Education provided Yes   Education Details ther-ex   [employer]) Educated Patient   Methods  Explanation;Demonstration;Tactile cues;Verbal cues   Comprehension Verbalized understanding;Returned demonstration             PT Long Term Goals - 01/31/15 1750    PT LONG TERM GOAL #1   Title Patient will demonstrate improved TUG score of  1 min  to decrease fall risk   Baseline 1 min 19 sec   Time 6   Period Weeks   Status Revised   PT LONG TERM GOAL #2   Title Patient will be able to ambulate with rw at least 130 ft with CGA +1 (second person pushing wheel chair) without rest to promote ability to ambulate around the house.    Baseline --   Time 6   Period Weeks   Status Revised   PT LONG TERM GOAL #3   Title Patient will demonstrate 5x STS in 1 min 45 seconds for improved strength and mobility at home.    Time 6   Period Weeks   Status Revised   PT LONG TERM GOAL #4   Title Patient will improve LEFS score to > 25/80 demonstrating improved function.   Baseline 19/80   Time 8   Period Weeks   Status On-going               Plan - 02/07/15 1614    Clinical Impression Statement Light session today secondary to pt recovering from pneumonia and age. Decreased level of assist with sit <> stand with increased repetition and cues for bringing center of gravity over feet.   Pt will benefit from skilled therapeutic intervention in order to improve on the following deficits Decreased activity tolerance;Decreased balance;Impaired flexibility;Decreased mobility   Rehab Potential Fair   PT Frequency 2x / week   PT Duration 6 weeks   PT Treatment/Interventions ADLs/Self Care Home Management;Gait training;Therapeutic activities;Therapeutic exercise;Balance training;Wheelchair mobility training;Patient/family education;Passive range of motion   PT Next Visit Plan Strengthening, gait, stretching of LEs. balance   Consulted and Agree with Plan of Care Patient;Family member/caregiver   Family Member Consulted one of pt daughters        Problem List Patient Active Problem  List   Diagnosis Date Noted  . 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 Discoloration of skin of foot-Left 10/13/2013  . PAD (peripheral artery disease) (HCC)   . TIA (transient ischemic attack)   . Edema   . Hyperlipidemia   . Syncope   . CAD (coronary artery disease)   . Venous insufficiency   . Carotid artery disease (HCC)   . Elevated CPK   . Popliteal aneurysm (HCC)   . Speech problem   .  Hx of CABG   . Ejection fraction   . Prominent abdominal aortic pulsation   . Intracranial hemorrhage (HCC)   . Bradycardia     Loralyn FreshwaterMiguel Akif Weldy PT, DPT   02/07/2015, 6:06 PM  [facility] [address] [place], [place], [address] Phone: [phone]   Fax:  [phone]  Name: [name] [name] MRN: [id number] Date of Birth: 02/14/1922

## 2015-02-09 ENCOUNTER — Encounter: Payer: Self-pay | Admitting: Family Medicine

## 2015-02-09 ENCOUNTER — Ambulatory Visit (INDEPENDENT_AMBULATORY_CARE_PROVIDER_SITE_OTHER): Payer: Medicare Other | Admitting: Family Medicine

## 2015-02-09 VITALS — BP 115/74 | HR 76 | Temp 97.4°F | Ht 71.0 in | Wt 203.0 lb

## 2015-02-09 DIAGNOSIS — J189 Pneumonia, unspecified organism: Secondary | ICD-10-CM | POA: Insufficient documentation

## 2015-02-09 DIAGNOSIS — J181 Lobar pneumonia, unspecified organism: Principal | ICD-10-CM

## 2015-02-09 NOTE — Progress Notes (Signed)
BP 115/74 mmHg  Pulse 76  Temp(Src) 97.4 F (36.3 C)  Ht _0  (1.803 m)  Wt 203 lb (92.08 kg)  BMI 28.33 kg/m2  SpO2 99%   Subjective:    Patient ID: [name] [name], male    DOB: 1921/04/23, 81 y.o.   MRN: [id number]  HPI: VIRAT PRATHER is a 81 y.o. male  Chief Complaint  Patient presents with  . Pneumonia    Relevant past medical, surgical, family and social history reviewed and updated as indicated. Interim medical history since our last visit reviewed. Allergies and medications reviewed and updated.  Review of Systems  Constitutional: Negative for fever and fatigue.  Respiratory: Negative for cough, choking, chest tightness, shortness of breath and wheezing.   Cardiovascular: Negative for chest pain, palpitations and leg swelling.    Per HPI unless specifically indicated above     Objective:    BP 115/74 mmHg  Pulse 76  Temp(Src) 97.4 F (36.3 C)  Ht _1  (1.803 m)  Wt 203 lb (92.08 kg)  BMI 28.33 kg/m2  SpO2 99%  Wt Readings from Last 3 Encounters:  02/09/15 203 lb (92.08 kg)  12/07/14 205 lb (92.987 kg)  11/28/14 203 lb 9.6 oz (92.352 kg)    Physical Exam  Constitutional: He is oriented to person, place, and time. He appears well-developed and well-nourished. No distress.  HENT:  Head: Normocephalic and atraumatic.  Right Ear: Hearing normal.  Left Ear: Hearing normal.  Nose: Nose normal.  Eyes: Conjunctivae and lids are normal. Right eye exhibits no discharge. Left eye exhibits no discharge. No scleral icterus.  Pulmonary/Chest: Effort normal. No respiratory distress.  Rales noted right lower lobe  Musculoskeletal: Normal range of motion.  Neurological: He is alert and oriented to person, place, and time.  Skin: Skin is intact. No rash noted.  Psychiatric: He has a normal mood and affect. His speech is normal and behavior is normal. Judgment and thought content normal. Cognition and memory are normal.    Results for orders placed or performed  during the hospital encounter of 06/26/10  Urine culture  Result Value Ref Range   Specimen Description URINE, CATHETERIZED    Special Requests NONE    Culture  Setup Time [id number]    Colony Count NO GROWTH    Culture NO GROWTH    Report Status 06/27/2010 FINAL   Culture, blood (routine x 2)  Result Value Ref Range   Specimen Description BLOOD ARM LEFT    Special Requests BOTTLES DRAWN AEROBIC ONLY 8CC    Culture  Setup Time [id number]    Culture NO GROWTH 5 DAYS    Report Status 07/03/2010 FINAL   Culture, blood (routine x 2)  Result Value Ref Range   Specimen Description BLOOD ARM RIGHT    Special Requests BOTTLES DRAWN AEROBIC ONLY 8CC    Culture  Setup Time [id number]    Culture NO GROWTH 5 DAYS    Report Status 07/03/2010 FINAL   Differential  Result Value Ref Range   Neutrophils Relative % 79 (H) 43 - 77 %   Lymphocytes Relative 12 12 - 46 %   Monocytes Relative 8 3 - 12 %   Eosinophils Relative 1 0 - 5 %   Basophils Relative 0 0 - 1 %   Neutro Abs 5.4 1.7 - 7.7 K/uL   Lymphs Abs 0.8 0.7 - 4.0 K/uL   Monocytes Absolute 0.5 0.1 - 1.0 K/uL   Eosinophils Absolute 0.1 0.0 -  0.7 K/uL   Basophils Absolute 0.0 0.0 - 0.1 K/uL   Smear Review FIBRIN STRANDS NOTED MORPHOLOGY UNREMARKABLE   CBC  Result Value Ref Range   WBC 6.8 4.0 - 10.5 K/uL   RBC 4.52 4.22 - 5.81 MIL/uL   Hemoglobin 14.5 13.0 - 17.0 g/dL   HCT 41.2 39.0 - 52.0 %   MCV 91.2 78.0 - 100.0 fL   MCH 32.1 26.0 - 34.0 pg   MCHC 35.2 30.0 - 36.0 g/dL   RDW 12.9 11.5 - 15.5 %   Platelets  150 - 400 K/uL    PLATELET CLUMPS NOTED ON SMEAR, COUNT APPEARS ADEQUATE  Protime-INR  Result Value Ref Range   Prothrombin Time 13.5 11.6 - 15.2 seconds   INR 1.01 0.00 - 1.49  APTT  Result Value Ref Range   aPTT 21 (L) 24 - 37 seconds  CK total and CKMB  Result Value Ref Range   Total CK 94 7 - 232 U/L   CK, MB 1.6 0.3 - 4.0 ng/mL   Relative Index  0.0 - 2.5    RELATIVE INDEX IS INVALID WHEN CK < 100 U/L          Comprehensive metabolic panel  Result Value Ref Range   Sodium 134 (L) 135 - 145 mEq/L   Potassium 5.2 (H) 3.5 - 5.1 mEq/L   Chloride 100 96 - 112 mEq/L   CO2 26 19 - 32 mEq/L   Glucose, Bld 119 (H) 70 - 99 mg/dL   BUN 16 6 - 23 mg/dL   Creatinine, Ser 0.78 0.4 - 1.5 mg/dL   Calcium 8.9 8.4 - 10.5 mg/dL   Total Protein 6.4 6.0 - 8.3 g/dL   Albumin 3.6 3.5 - 5.2 g/dL   AST 37 0 - 37 U/L   ALT 13 0 - 53 U/L   Alkaline Phosphatase 48 39 - 117 U/L   Total Bilirubin 1.2 0.3 - 1.2 mg/dL   GFR calc non Af Amer >60 >60 mL/min   GFR calc Af Amer  >60 mL/min    >60        The eGFR has been calculated using the MDRD equation. This calculation has not been validated in all clinical situations. eGFR's persistently <60 mL/min signify possible Chronic Kidney Disease.  Troponin I  Result Value Ref Range   Troponin I 0.01        NO INDICATION OF MYOCARDIAL INJURY. 0.00 - 0.06 ng/mL  Lactic acid, plasma  Result Value Ref Range   Lactic Acid, Venous 2.0 0.5 - 2.2 mmol/L  Procalcitonin  Result Value Ref Range   Procalcitonin  ng/mL    <0.10        PCT (Procalcitonin) <= 0.5 ng/mL: Systemic infection (sepsis) is not likely. Local bacterial infection is possible.        PCT > 0.5 ng/mL and <= 2 ng/mL: Systemic infection (sepsis) is possible, but other conditions are known to elevate PCT as well.        PCT > 2 ng/mL: Systemic infection (sepsis) is likely, unless other causes are known.        PCT >= 10 ng/mL: Important systemic inflammatory response, almost exclusively due to severe bacterial sepsis or septic shock.  CK total and CKMB  Result Value Ref Range   Total CK 46 7 - 232 U/L   CK, MB 1.3 0.3 - 4.0 ng/mL   Relative Index  0.0 - 2.5    RELATIVE INDEX IS INVALID WHEN  CK < 100 U/L         Troponin I  Result Value Ref Range   Troponin I 0.02        NO INDICATION OF MYOCARDIAL INJURY. 0.00 - 0.06 ng/mL  Urinalysis, Routine w reflex microscopic  Result Value  Ref Range   Color, Urine YELLOW YELLOW   APPearance CLOUDY (A) CLEAR   Specific Gravity, Urine 1.023 1.005 - 1.030   pH 8.0 5.0 - 8.0   Glucose, UA NEGATIVE NEGATIVE mg/dL   Hgb urine dipstick NEGATIVE NEGATIVE   Bilirubin Urine NEGATIVE NEGATIVE   Ketones, ur 15 (A) NEGATIVE mg/dL   Protein, ur NEGATIVE NEGATIVE mg/dL   Urobilinogen, UA 1.0 0.0 - 1.0 mg/dL   Nitrite NEGATIVE NEGATIVE   Leukocytes, UA  NEGATIVE    NEGATIVE MICROSCOPIC NOT DONE ON URINES WITH NEGATIVE PROTEIN, BLOOD, LEUKOCYTES, NITRITE, OR GLUCOSE <1000 mg/dL.  Strep pneumoniae urinary antigen  Result Value Ref Range   Strep Pneumo Urinary Antigen  NEGATIVE    NEGATIVE        Infection due to S. pneumoniae cannot be absolutely ruled out since the antigen present may be below the detection limit of the test.  TSH  Result Value Ref Range   TSH 1.020 0.350 - 4.500 uIU/mL  Hemoglobin A1c  Result Value Ref Range   Hgb A1c MFr Bld (H) <5.7 %    6.0 (NOTE)                                                                       According to the ADA Clinical Practice Recommendations for 2011, when HbA1c is used as a screening test:   >=6.5%   Diagnostic of Diabetes Mellitus           (if abnormal result  is confirmed)  5.7-6.4%   Increased risk of developing Diabetes Mellitus  References:Diagnosis and Classification of Diabetes Mellitus,Diabetes TKZS,0109,32(TFTDD 1):S62-S69 and Standards of Medical Care in         Diabetes - 2011,Diabetes Care,2011,34  (Suppl 1):S11-S61.   Mean Plasma Glucose 126 (H) <117 mg/dL  Lipid panel  Result Value Ref Range   Cholesterol  0 - 200 mg/dL    108        ATP III CLASSIFICATION:  <200     mg/dL   Desirable  200-239  mg/dL   Borderline High  >=240    mg/dL   High          Triglycerides 61 <150 mg/dL   HDL 36 (L) >39 mg/dL   Total CHOL/HDL Ratio 3.0 RATIO   VLDL 12 0 - 40 mg/dL   LDL Cholesterol  0 - 99 mg/dL    60        Total Cholesterol/HDL:CHD Risk Coronary Heart Disease  Risk Table                     Men   Women  1/2 Average Risk   3.4   3.3  Average Risk       5.0   4.4  2 X Average Risk   9.6   7.1  3 X Average Risk  23.4   11.0  Use the calculated Patient Ratio above and the CHD Risk Table to determine the patient's CHD Risk.        ATP III CLASSIFICATION (LDL):  <100     mg/dL   Optimal  100-129  mg/dL   Near or Above                    Optimal  130-159  mg/dL   Borderline  160-189  mg/dL   High  >190     mg/dL   Very High  Cardiac panel(cret kin+cktot+mb+tropi)  Result Value Ref Range   Total CK 98 7 - 232 U/L   CK, MB 2.1 0.3 - 4.0 ng/mL   Troponin I 0.02        NO INDICATION OF MYOCARDIAL INJURY. 0.00 - 0.06 ng/mL   Relative Index  0.0 - 2.5    RELATIVE INDEX IS INVALID WHEN CK < 100 U/L         Legionella antigen, urine  Result Value Ref Range   Specimen Description URINE, RANDOM    Special Requests NONE    Legionella Antigen, Urine Negative for Legionella pneumophilia serogroup 1    Report Status 06/27/2010 FINAL   Cardiac panel(cret kin+cktot+mb+tropi)  Result Value Ref Range   Total CK 160 7 - 232 U/L   CK, MB 2.6 0.3 - 4.0 ng/mL   Troponin I 0.02        NO INDICATION OF MYOCARDIAL INJURY. 0.00 - 0.06 ng/mL   Relative Index 1.6 0.0 - 2.5  Vitamin B12  Result Value Ref Range   Vitamin B-12 266 211 - 911 pg/mL  Folate  Result Value Ref Range   Folate  ng/mL    >20.0 (NOTE)  Reference Ranges        Deficient:       0.4 - 3.3 ng/mL        Indeterminate:   3.4 - 5.4 ng/mL        Normal:              > 5.4 ng/mL  RPR  Result Value Ref Range   RPR Ser Ql NON REACTIVE NON REACTIVE  CBC  Result Value Ref Range   WBC 6.0 4.0 - 10.5 K/uL   RBC 4.20 (L) 4.22 - 5.81 MIL/uL   Hemoglobin 13.0 13.0 - 17.0 g/dL   HCT 38.6 (L) 39.0 - 52.0 %   MCV 91.9 78.0 - 100.0 fL   MCH 31.0 26.0 - 34.0 pg   MCHC 33.7 30.0 - 36.0 g/dL   RDW 12.9 11.5 - 15.5 %   Platelets 113 (L) 150 - 400 K/uL  Basic metabolic panel  Result Value  Ref Range   Sodium 140 135 - 145 mEq/L   Potassium 4.2 3.5 - 5.1 mEq/L   Chloride 102 96 - 112 mEq/L   CO2 28 19 - 32 mEq/L   Glucose, Bld 109 (H) 70 - 99 mg/dL   BUN 9 6 - 23 mg/dL   Creatinine, Ser 0.78 0.4 - 1.5 mg/dL   Calcium 9.2 8.4 - 10.5 mg/dL   GFR calc non Af Amer >60 >60 mL/min   GFR calc Af Amer  >60 mL/min    >60        The eGFR has been calculated using the MDRD equation. This calculation has not been validated in all clinical situations. eGFR's persistently <60 mL/min signify possible Chronic Kidney Disease.  ANA  Result Value Ref Range   Anit  Nuclear Antibody(ANA) NEGATIVE NEGATIVE  Vitamin B1  Result Value Ref Range   Vitamin B1 (Thiamine) 38 9 - 44 nmol/L  I-STAT, chem 8  Result Value Ref Range   Sodium 134 (L) 135 - 145 mEq/L   Potassium 5.3 (H) 3.5 - 5.1 mEq/L   Chloride 103 96 - 112 mEq/L   BUN 22 6 - 23 mg/dL   Creatinine, Ser 0.9 0.4 - 1.5 mg/dL   Glucose, Bld 113 (H) 70 - 99 mg/dL   Calcium, Ion 0.91 (L) 1.12 - 1.32 mmol/L   TCO2 24 0 - 100 mmol/L   Hemoglobin 14.3 13.0 - 17.0 g/dL   HCT 42.0 39.0 - 52.0 %      Assessment & Plan:   Problem List Items Addressed This Visit      Respiratory   Pneumonia - Primary    Discuss resolving pneumonia Patient scheduled to have dental work Encouraged to have dental work sooner than later that weight at this point in next year to let lungs feel a little bit more but need to have dental work done to have possible seeding from bacterial infection in mouth removed       Relevant Medications   doxycycline (VIBRAMYCIN) 100 MG capsule       Follow up plan: Return for As scheduled.

## 2015-02-09 NOTE — Assessment & Plan Note (Signed)
Discuss resolving pneumonia Patient scheduled to have dental work Encouraged to have dental work sooner than later that weight at this point in next year to let lungs feel a little bit more but need to have dental work done to have possible seeding from bacterial infection in mouth removed

## 2015-02-14 ENCOUNTER — Ambulatory Visit: Payer: Medicare Other

## 2015-02-14 DIAGNOSIS — R531 Weakness: Secondary | ICD-10-CM | POA: Diagnosis not present

## 2015-02-14 DIAGNOSIS — R262 Difficulty in walking, not elsewhere classified: Secondary | ICD-10-CM

## 2015-02-14 NOTE — Therapy (Signed)
[facility] [address] [place], [place], [address] Phone: [phone]   Fax:  [phone]  Physical Therapy Treatment  Patient Details  Name: [name] Palmerlbert L Mcmurtrey MRN: [id number] Date of Birth: 01/19/1922 Referring Provider: Olevia PerchesMegan Johnson, DO  Encounter Date: 02/14/2015      PT End of Session - 02/14/15 1639    Visit Number 18   Number of Visits 27   Date for PT Re-Evaluation 03/16/15   Authorization Type 3   Authorization Time Period of 10   PT Start Time 1639   PT Stop Time 1723   PT Time Calculation (min) 44 min   Equipment Utilized During Treatment Gait belt   Activity Tolerance Patient tolerated treatment well;No increased pain;Patient limited by fatigue   [facility] for tasks assessed/performed      Past Medical History  Diagnosis Date  . CAD (coronary artery disease)     nuclear 01/2008, no ischemia, EF 60%, distal anterior scar  . Hyperlipidemia   . Syncope     .remote..no etiology  /  syncope 11/2008.. from acute leg pain  . Venous insufficiency     support hose  . Carotid artery disease (HCC)     doppler 05/2009,  0-39% bilateral / doppler 06/2010 OK  . Elevated CPK     11/23/08.Marland Kitchen.Marland Kitchen.probably from debris from popliteal aneurysm  . Popliteal aneurysm (HCC)     .repair..11/25/2008  Dr. Darrick PennaFields  . Speech problem     Neurologic event with brief infusion and speech difficulty.... January, 2011.... being assessed by neurology  February, 2011  . Hx of CABG     1998  . Ejection fraction     EF 45%, echo,05/2009,apical hypo,  ?? posterior and lateral hypo  . Prominent abdominal aortic pulsation     doppler normal  . Intracranial hemorrhage (HCC)     Two small subependymal bleeds 06/2010,,ASA held  . Bradycardia     06/2010  . Edema     Ankle edema August, 2012  . TIA (transient ischemic attack)     Brief episode of confusion and speech difficulty, January, 2012  /  recurrent confusion speech  difficulty September, 2012  . PAD (peripheral artery disease) (HCC)     Above the knee to below the knee popliteal bypass,, right  //    Doppler,  July, 2013, patent  . Fall at home July 2015    Past Surgical History  Procedure Laterality Date  . Coronary artery bypass graft  1998  . Popliteal bypass  with reversed greater saphenous vein from right leg.      Filed Vitals:   02/14/15 1643  SpO2: 98%    Visit Diagnosis:  Weakness  Difficulty walking      Subjective Assessment - 02/14/15 1640    Subjective Pt daughter [name](Denise) states, MD gave pt a clean bill of health. Still some very tiny stuff MD heard from his lungs but no pneumononia.  Pt good to go with physical therapy.   Patient is accompained by: Family member   Limitations Standing;Walking;House hold activities   Patient Stated Goals "To be able to walk again."   Currently in Pain? No/denies    Pt daughter also states that they tried compression stockings (too hard to get on) and gauze wrap for swelling. Gauze wrap caused pressure related issues.     Objectives:  There-ex  Directed patient with sit <> stand 3x mod to min assist, then throughout  session mod assist to CGA Static standing balance 7 seconds, then 10 seconds x 2 (CGA to min A), cues for placing and maintaining his center of gravity over his feet Seated bilateral knee extension stretch x 2 min    Then 2 more min with quad sets every 5 seconds to promote knee extension  Seated self gastroc stretch with gait belt 15 seconds x 5 each LE Seated ankle DF active 15x2    97 % SpO2  Gait with rw CGA +1 for 54 ft (96% SpO2), 59 ft (95% SpO2)  Cues for increasing foot clearance, and staying away from obstacles. SpO2 increased to 97% after rest.     Improved exercise technique, placing and maintaining his center of gravity over his base of support, movement at target joints, use of target muscles after mod verbal, visual, tactile cues.      Pt able to ambulate  28ft to 59 ft today compared to last session. Gradually building patient back up to 100 ft of gait as he recovers from his pneumonia. SpO2 ranged between 95% - 97% room air after activities today.                         PT Education - 02/14/15 1648    Education provided Yes   Education Details ther-ex   [employer]) Educated Patient   Methods Explanation;Demonstration;Tactile cues;Verbal cues   Comprehension Verbalized understanding;Returned demonstration             PT Long Term Goals - 01/31/15 1750    PT LONG TERM GOAL #1   Title Patient will demonstrate improved TUG score of  1 min  to decrease fall risk   Baseline 1 min 19 sec   Time 6   Period Weeks   Status Revised   PT LONG TERM GOAL #2   Title Patient will be able to ambulate with rw at least 130 ft with CGA +1 (second person pushing wheel chair) without rest to promote ability to ambulate around the house.    Baseline --   Time 6   Period Weeks   Status Revised   PT LONG TERM GOAL #3   Title Patient will demonstrate 5x STS in 1 min 45 seconds for improved strength and mobility at home.    Time 6   Period Weeks   Status Revised   PT LONG TERM GOAL #4   Title Patient will improve LEFS score to > 25/80 demonstrating improved function.   Baseline 19/80   Time 8   Period Weeks   Status On-going               Plan - 02/14/15 1651    Clinical Impression Statement Pt able to ambulate 47ft to 59 ft today compared to last session. Gradually building patient back up to 100 ft of gait as he recovers from his pneumonia. SpO2 ranged between 95% - 97% room air after activities today.    Pt will benefit from skilled therapeutic intervention in order to improve on the following deficits Decreased activity tolerance;Decreased balance;Impaired flexibility;Decreased mobility   Rehab Potential Fair   PT Frequency 2x / week   PT Duration 6 weeks   PT Treatment/Interventions ADLs/Self Care Home  Management;Gait training;Therapeutic activities;Therapeutic exercise;Balance training;Wheelchair mobility training;Patient/family education;Passive range of motion   PT Next Visit Plan Strengthening, gait, stretching of LEs. balance   Consulted and Agree with Plan of Care Patient;Family member/caregiver   Family Member Consulted  one of pt daughters        Problem List Patient Active Problem List   Diagnosis Date Noted  . Pneumonia 02/09/2015  . 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 Discoloration of skin of foot-Left 10/13/2013  . PAD (peripheral artery disease) (HCC)   . TIA (transient ischemic attack)   . Edema   . Hyperlipidemia   . Syncope   . CAD (coronary artery disease)   . Venous insufficiency   . Carotid artery disease (HCC)   . Elevated CPK   . Popliteal aneurysm (HCC)   . Speech problem   . Hx of CABG   . Ejection fraction   . Prominent abdominal aortic pulsation   . Intracranial hemorrhage (HCC)   . Bradycardia    Loralyn Freshwater PT, DPT   02/14/2015, 8:30 PM  [facility] PHYSICAL AND SPORTS MEDICINE [address], [place], [address] Phone: [phone]   Fax:  [phone]  Name: JEDIDIAH DEMARTINI MRN: [id number] Date of Birth: [birth year]

## 2015-02-16 ENCOUNTER — Ambulatory Visit: Payer: Medicare Other

## 2015-02-16 DIAGNOSIS — R262 Difficulty in walking, not elsewhere classified: Secondary | ICD-10-CM

## 2015-02-16 DIAGNOSIS — R531 Weakness: Secondary | ICD-10-CM

## 2015-02-16 NOTE — Therapy (Signed)
[facility] [address] [place], [address] Phone: [phone]   Fax:  [phone]  Physical Therapy Treatment  Patient Details  Name: [name] [name] MRN: [id number] Date of Birth: Sep 28, 1921 Referring Provider: Olevia Perches, DO  Encounter Date: 02/16/2015      PT End of Session - 02/16/15 1709    Visit Number 19   Number of Visits 27   Date for PT Re-Evaluation 03/16/15   Authorization Type 4   Authorization Time Period of 10   PT Start Time 1709   PT Stop Time 1751   PT Time Calculation (min) 42 min   Equipment Utilized During Treatment Gait belt   Activity Tolerance Patient tolerated treatment well;No increased pain;Patient limited by fatigue   Behavior During Therapy [facility] for tasks assessed/performed      Past Medical History  Diagnosis Date  . CAD (coronary artery disease)     nuclear 01/2008, no ischemia, EF 60%, distal anterior scar  . Hyperlipidemia   . Syncope     .remote..no etiology  /  syncope 11/2008.. from acute leg pain  . Venous insufficiency     support hose  . Carotid artery disease (HCC)     doppler 05/2009,  0-39% bilateral / doppler 06/2010 OK  . Elevated CPK     11/23/08.Marland KitchenMarland Kitchenprobably from debris from popliteal aneurysm  . Popliteal aneurysm (HCC)     .repair..11/25/2008  Dr. Darrick Penna  . Speech problem     Neurologic event with brief infusion and speech difficulty.... January, 2011.... being assessed by neurology  February, 2011  . Hx of CABG     1998  . Ejection fraction     EF 45%, echo,05/2009,apical hypo,  ?? posterior and lateral hypo  . Prominent abdominal aortic pulsation     doppler normal  . Intracranial hemorrhage (HCC)     Two small subependymal bleeds 06/2010,,ASA held  . Bradycardia     06/2010  . Edema     Ankle edema August, 2012  . TIA (transient ischemic attack)     Brief episode of confusion and speech difficulty, January, 2012  /  recurrent confusion speech  difficulty September, 2012  . PAD (peripheral artery disease) (HCC)     Above the knee to below the knee popliteal bypass,, right  //    Doppler,  July, 2013, patent  . Fall at home July 2015    Past Surgical History  Procedure Laterality Date  . Coronary artery bypass graft  1998  . Popliteal bypass  with reversed greater saphenous vein from right leg.      There were no vitals filed for this visit.  Visit Diagnosis:  Weakness  Difficulty walking      Subjective Assessment - 02/16/15 1711    Subjective Pt states feeling good. Ate lunch.   Patient is accompained by: Family member   Limitations Standing;Walking;House hold activities   Patient Stated Goals "To be able to walk again."   Currently in Pain? No/denies         Objectives:  There-ex  Directed patient with seated hip flexion 10x3 with 3 second holds (to promote foot clearance)  Manual gastroc stretch from PT 1 min each Manually resisted leg press seated 10x3 each LE  98% SpO2 Sit <> stand from wc CGA 2x, then multiple times throughout session CGA to SBA Static standing with glute max squeeze and upright posture 10 seconds x 2 to also stretch hip  flexors  Gait 100 ft CGA + 1 (SpO2 98%)   Static standing balance without UE assist CGA 40 seconds, 45 seconds, 50 seconds  Seated ankle DF 15x2   Improved exercise technique, movement at target joints, use of target muscles after mod verbal, visual, tactile cues.     Pt able to stand up from wc today without manual assist. Also able to ambulate 100 ft today. Pt making progress with transfers and returning back to gait distances prior to pneumonia.                          PT Education - 02/16/15 1723    Education provided Yes   Education Details ther-ex   [employer]) Educated Patient   Methods Explanation;Demonstration;Tactile cues;Verbal cues   Comprehension Verbalized understanding;Returned demonstration             PT Long  Term Goals - 01/31/15 1750    PT LONG TERM GOAL #1   Title Patient will demonstrate improved TUG score of  1 min  to decrease fall risk   Baseline 1 min 19 sec   Time 6   Period Weeks   Status Revised   PT LONG TERM GOAL #2   Title Patient will be able to ambulate with rw at least 130 ft with CGA +1 (second person pushing wheel chair) without rest to promote ability to ambulate around the house.    Baseline --   Time 6   Period Weeks   Status Revised   PT LONG TERM GOAL #3   Title Patient will demonstrate 5x STS in 1 min 45 seconds for improved strength and mobility at home.    Time 6   Period Weeks   Status Revised   PT LONG TERM GOAL #4   Title Patient will improve LEFS score to > 25/80 demonstrating improved function.   Baseline 19/80   Time 8   Period Weeks   Status On-going               Plan - 02/16/15 1724    Clinical Impression Statement Pt able to stand up from wc today without manual assist. Also able to ambulate 100 ft today. Pt making progress with transfers and returning back to gait distances prior to pneumonia.    Pt will benefit from skilled therapeutic intervention in order to improve on the following deficits Decreased activity tolerance;Decreased balance;Impaired flexibility;Decreased mobility   Rehab Potential Fair   PT Frequency 2x / week   PT Duration 6 weeks   PT Treatment/Interventions ADLs/Self Care Home Management;Gait training;Therapeutic activities;Therapeutic exercise;Balance training;Wheelchair mobility training;Patient/family education;Passive range of motion   PT Next Visit Plan Strengthening, gait, stretching of LEs. balance   Consulted and Agree with Plan of Care Patient;Family member/caregiver   Family Member Consulted one of pt daughters        Problem List Patient Active Problem List   Diagnosis Date Noted  . Pneumonia 02/09/2015  . 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 Discoloration of skin of foot-Left 10/13/2013  .  PAD (peripheral artery disease) (HCC)   . TIA (transient ischemic attack)   . Edema   . Hyperlipidemia   . Syncope   . CAD (coronary artery disease)   . Venous insufficiency   . Carotid artery disease (HCC)   . Elevated CPK   . Popliteal aneurysm (HCC)   . Speech problem   . Hx of CABG   . Ejection fraction   .  Prominent abdominal aortic pulsation   . Intracranial hemorrhage (HCC)   . Bradycardia     Loralyn FreshwaterMiguel Laygo PT, DPT   02/16/2015, 7:44 PM  [facility] [facility] [address] [place], [place], [address] Phone: [phone]   Fax:  [phone]  Name: [name] [name] MRN: [id number] Date of Birth: 07/31/1921

## 2015-02-21 ENCOUNTER — Ambulatory Visit: Payer: Medicare Other

## 2015-02-21 DIAGNOSIS — R531 Weakness: Secondary | ICD-10-CM | POA: Diagnosis not present

## 2015-02-21 DIAGNOSIS — R262 Difficulty in walking, not elsewhere classified: Secondary | ICD-10-CM

## 2015-02-21 NOTE — Therapy (Signed)
[facility] [address] [place], [address] Phone: [phone]   Fax:  [phone]  Physical Therapy Treatment  Patient Details  Name: [name] [name] MRN: [id number] Date of Birth: Jun 29, 1921 Referring Provider: Olevia Perches, DO  Encounter Date: 02/21/2015      PT End of Session - 02/21/15 1651    Visit Number 20   Number of Visits 27   Date for PT Re-Evaluation 03/16/15   Authorization Type 5   Authorization Time Period of 10   PT Start Time 1651   PT Stop Time 1733   PT Time Calculation (min) 42 min   Equipment Utilized During Treatment Gait belt   Activity Tolerance Patient tolerated treatment well;No increased pain;Patient limited by fatigue   Behavior During Therapy [facility] for tasks assessed/performed      Past Medical History  Diagnosis Date  . CAD (coronary artery disease)     nuclear 01/2008, no ischemia, EF 60%, distal anterior scar  . Hyperlipidemia   . Syncope     .remote..no etiology  /  syncope 11/2008.. from acute leg pain  . Venous insufficiency     support hose  . Carotid artery disease (HCC)     doppler 05/2009,  0-39% bilateral / doppler 06/2010 OK  . Elevated CPK     11/23/08.Marland KitchenMarland Kitchenprobably from debris from popliteal aneurysm  . Popliteal aneurysm (HCC)     .repair..11/25/2008  Dr. Darrick Penna  . Speech problem     Neurologic event with brief infusion and speech difficulty.... January, 2011.... being assessed by neurology  February, 2011  . Hx of CABG     1998  . Ejection fraction     EF 45%, echo,05/2009,apical hypo,  ?? posterior and lateral hypo  . Prominent abdominal aortic pulsation     doppler normal  . Intracranial hemorrhage (HCC)     Two small subependymal bleeds 06/2010,,ASA held  . Bradycardia     06/2010  . Edema     Ankle edema August, 2012  . TIA (transient ischemic attack)     Brief episode of confusion and speech difficulty, January, 2012  /  recurrent confusion speech  difficulty September, 2012  . PAD (peripheral artery disease) (HCC)     Above the knee to below the knee popliteal bypass,, right  //    Doppler,  July, 2013, patent  . Fall at home July 2015    Past Surgical History  Procedure Laterality Date  . Coronary artery bypass graft  1998  . Popliteal bypass  with reversed greater saphenous vein from right leg.      Filed Vitals:   02/21/15 1657  SpO2: 96%    Visit Diagnosis:  Weakness  Difficulty walking      Subjective Assessment - 02/21/15 1656    Subjective Pt states eating lunch today. No pain or discomfort.    Patient is accompained by: Family member   Limitations Standing;Walking;House hold activities   Patient Stated Goals "To be able to walk again."   Currently in Pain? No/denies   Multiple Pain Sites No       Objectives:  Directed patient with sit <> stand with CGA to SBA to stand, min A to sit, 3x from wc  and throughout session,   Static standing balance without UE assist 30 seconds (CGA), 60 seconds (CGA to min A), 60 seconds (CGA) with cues for placing center of gravity over base of support  Seated manual gastroc  stretch 1 min each LE  Seated knee extension resisting 2 lb weight 10x each LE  97% SpO2 room air Gait with rw 101 ft (98% SpO2), 31 ft (SpO2 94%, increased to 97% after rest, room air)  with CGA +1    Seated bilateral hamstring stretch with legs propped on stool 2 min  then with quad sets every 5 seconds for 2 minutes to promote knee extension  Seated hip flexion 10x each LE  Improved exercise technique, movement at target joints, use of target muscles after mod verbal, visual, tactile cues.     SpO2 95% -96% at end of session.   Able to perform static standing balance without UE assist 60 seconds today. Able to ambulate 100 ft today again but with slightly more fatigue observed compared to previous session. Able to stand up from his wc with CGA average compared to min A to mod A average from  previous sessions.                        PT Education - 02/21/15 1657    Education provided Yes   Education Details ther-ex   [employer]) Educated Patient   Methods Explanation;Demonstration;Tactile cues;Verbal cues   Comprehension Verbalized understanding;Returned demonstration             PT Long Term Goals - 01/31/15 1750    PT LONG TERM GOAL #1   Title Patient will demonstrate improved TUG score of  1 min  to decrease fall risk   Baseline 1 min 19 sec   Time 6   Period Weeks   Status Revised   PT LONG TERM GOAL #2   Title Patient will be able to ambulate with rw at least 130 ft with CGA +1 (second person pushing wheel chair) without rest to promote ability to ambulate around the house.    Baseline --   Time 6   Period Weeks   Status Revised   PT LONG TERM GOAL #3   Title Patient will demonstrate 5x STS in 1 min 45 seconds for improved strength and mobility at home.    Time 6   Period Weeks   Status Revised   PT LONG TERM GOAL #4   Title Patient will improve LEFS score to > 25/80 demonstrating improved function.   Baseline 19/80   Time 8   Period Weeks   Status On-going               Plan - 02/21/15 1659    Clinical Impression Statement Able to perform static standing balance without UE assist 60 seconds today. Able to ambulate 100 ft today again but with slightly more fatigue observed compared to previous session. Able to stand up from his wc with CGA average compared to min A to mod A average from previous sessions.    Pt will benefit from skilled therapeutic intervention in order to improve on the following deficits Decreased activity tolerance;Decreased balance;Impaired flexibility;Decreased mobility   Rehab Potential Fair   PT Frequency 2x / week   PT Duration 6 weeks   PT Treatment/Interventions ADLs/Self Care Home Management;Gait training;Therapeutic activities;Therapeutic exercise;Balance training;Wheelchair mobility  training;Patient/family education;Passive range of motion   PT Next Visit Plan Strengthening, gait, stretching of LEs. balance   Consulted and Agree with Plan of Care Patient;Family member/caregiver   Family Member Consulted one of pt daughters        Problem List Patient Active Problem List   Diagnosis Date Noted  .  Pneumonia 02/09/2015  . 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 Discoloration of skin of foot-Left 10/13/2013  . PAD (peripheral artery disease) (HCC)   . TIA (transient ischemic attack)   . Edema   . Hyperlipidemia   . Syncope   . CAD (coronary artery disease)   . Venous insufficiency   . Carotid artery disease (HCC)   . Elevated CPK   . Popliteal aneurysm (HCC)   . Speech problem   . Hx of CABG   . Ejection fraction   . Prominent abdominal aortic pulsation   . Intracranial hemorrhage (HCC)   . Bradycardia     [name] [name] PT, DPT   02/21/2015, 7:18 PM  [facility] [address] [place], [place], [address] Phone: [phone]   Fax:  [phone]  Name: Darden Palmerlbert L Lauter MRN: [id number] Date of Birth: 07/15/1921

## 2015-02-23 ENCOUNTER — Ambulatory Visit: Payer: Medicare Other

## 2015-02-23 DIAGNOSIS — R262 Difficulty in walking, not elsewhere classified: Secondary | ICD-10-CM

## 2015-02-23 DIAGNOSIS — R531 Weakness: Secondary | ICD-10-CM | POA: Diagnosis not present

## 2015-02-23 NOTE — Therapy (Signed)
[facility] [address], [place], [address] Phone: [phone]   Fax:  [phone]  Physical Therapy Treatment  Patient Details  Name: [name] [name] MRN: [id number] Date of Birth: November 07, 1921 Referring Provider: Olevia Perches, DO  Encounter Date: 02/23/2015      PT End of Session - 02/23/15 1355    Visit Number 21   Number of Visits 27   Date for PT Re-Evaluation 03/16/15   Authorization Type 6   Authorization Time Period of 10   PT Start Time 1356  pt arrived late   PT Stop Time 1438   PT Time Calculation (min) 42 min   Equipment Utilized During Treatment Gait belt   Activity Tolerance Patient tolerated treatment well;No increased pain;Patient limited by fatigue   Behavior During Therapy [facility] for tasks assessed/performed      Past Medical History  Diagnosis Date  . CAD (coronary artery disease)     nuclear 01/2008, no ischemia, EF 60%, distal anterior scar  . Hyperlipidemia   . Syncope     .remote..no etiology  /  syncope 11/2008.. from acute leg pain  . Venous insufficiency     support hose  . Carotid artery disease (HCC)     doppler 05/2009,  0-39% bilateral / doppler 06/2010 OK  . Elevated CPK     11/23/08.Marland KitchenMarland Kitchenprobably from debris from popliteal aneurysm  . Popliteal aneurysm (HCC)     .repair..11/25/2008  Dr. Darrick Penna  . Speech problem     Neurologic event with brief infusion and speech difficulty.... January, 2011.... being assessed by neurology  February, 2011  . Hx of CABG     1998  . Ejection fraction     EF 45%, echo,05/2009,apical hypo,  ?? posterior and lateral hypo  . Prominent abdominal aortic pulsation     doppler normal  . Intracranial hemorrhage (HCC)     Two small subependymal bleeds 06/2010,,ASA held  . Bradycardia     06/2010  . Edema     Ankle edema August, 2012  . TIA (transient ischemic attack)     Brief episode of confusion and speech difficulty, January, 2012  /  recurrent  confusion speech difficulty September, 2012  . PAD (peripheral artery disease) (HCC)     Above the knee to below the knee popliteal bypass,, right  //    Doppler,  July, 2013, patent  . Fall at home July 2015    Past Surgical History  Procedure Laterality Date  . Coronary artery bypass graft  1998  . Popliteal bypass  with reversed greater saphenous vein from right leg.      Filed Vitals:   02/23/15 1357  SpO2: 96%    Visit Diagnosis:  Weakness  Difficulty walking      Subjective Assessment - 02/23/15 1358    Subjective Pt states eating lunch, has protein. No pain or discomfort.   Patient is accompained by: Family member   Limitations Standing;Walking;House hold activities   Patient Stated Goals "To be able to walk again."   Currently in Pain? No/denies   Multiple Pain Sites No        Objectives:  Directed patient with sit <> stand with Min to mod A to stand, min A to sit, 3x from wc and throughout session min A to CGA  Static standing with upright posture to stretch anterior hip muscles 3 x 10 seconds, bilateral UE assist,   Sitting with upright posture and bilateral  scapular retraction improved SpO2 to 98 %  Gait with rw 113 ft (98% SpO2),  with CGA +1   Seated bilateral knee extension stretch 3 minutes  Seated manually resisted leg press 10x3 each LE   Seated bilateral scapular retraction resisting red band 10x5 second holds for 2 sets  Improved exercise technique, movement at target joints, use of target muscles after mod verbal, visual, tactile cues.     Able to ambulate up to 113 ft today with CGA +1. Performed exercises to promote hip flexion during gait, and strengthen muscles used to stand up from the chair as well as to promote better sitting posture for oxygenation.                          PT Education - 02/23/15 1401    Education provided Yes   Education Details [employer]   Person(s) Educated Patient   Methods  Explanation;Demonstration;Verbal cues   Comprehension Verbalized understanding;Returned demonstration             PT Long Term Goals - 01/31/15 1750    PT LONG TERM GOAL #1   Title Patient will demonstrate improved TUG score of  1 min  to decrease fall risk   Baseline 1 min 19 sec   Time 6   Period Weeks   Status Revised   PT LONG TERM GOAL #2   Title Patient will be able to ambulate with rw at least 130 ft with CGA +1 (second person pushing wheel chair) without rest to promote ability to ambulate around the house.    Baseline --   Time 6   Period Weeks   Status Revised   PT LONG TERM GOAL #3   Title Patient will demonstrate 5x STS in 1 min 45 seconds for improved strength and mobility at home.    Time 6   Period Weeks   Status Revised   PT LONG TERM GOAL #4   Title Patient will improve LEFS score to > 25/80 demonstrating improved function.   Baseline 19/80   Time 8   Period Weeks   Status On-going               Plan - 02/23/15 1402    Clinical Impression Statement Able to ambulate up to 113 ft today with CGA +1. Performed exercises to promote hip flexion during gait, and strengthen muscles used to stand up from the chair as well as to promote better sitting posture for oxygenation as tolerated.    Pt will benefit from skilled therapeutic intervention in order to improve on the following deficits Decreased activity tolerance;Decreased balance;Impaired flexibility;Decreased mobility   Rehab Potential Fair   PT Frequency 2x / week   PT Duration 6 weeks   PT Treatment/Interventions ADLs/Self Care Home Management;Gait training;Therapeutic activities;Therapeutic exercise;Balance training;Wheelchair mobility training;Patient/family education;Passive range of motion   PT Next Visit Plan Strengthening, gait, stretching of LEs. balance   Consulted and Agree with Plan of Care Patient;Family member/caregiver   Family Member Consulted one of pt daughters        Problem  List Patient Active Problem List   Diagnosis Date Noted  . Pneumonia 02/09/2015  . 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 Discoloration of skin of foot-Left 10/13/2013  . PAD (peripheral artery disease) (HCC)   . TIA (transient ischemic attack)   . Edema   . Hyperlipidemia   . Syncope   . CAD (coronary artery disease)   .  Venous insufficiency   . Carotid artery disease (HCC)   . Elevated CPK   . Popliteal aneurysm (HCC)   . Speech problem   . Hx of CABG   . Ejection fraction   . Prominent abdominal aortic pulsation   . Intracranial hemorrhage (HCC)   . Bradycardia     Loralyn Freshwater PT, DPT   02/23/2015, 2:47 PM  [facility] [address] [place], [address] Phone: [phone]   Fax:  [phone]  Name: RAESEAN BARTOLETTI MRN: [id number] Date of Birth: 12-30-1921

## 2015-02-28 ENCOUNTER — Ambulatory Visit: Payer: Medicare Other | Attending: Family Medicine

## 2015-02-28 DIAGNOSIS — R531 Weakness: Secondary | ICD-10-CM | POA: Diagnosis not present

## 2015-02-28 DIAGNOSIS — R262 Difficulty in walking, not elsewhere classified: Secondary | ICD-10-CM | POA: Diagnosis present

## 2015-02-28 NOTE — Therapy (Signed)
[facility] [address] [place], [address] Phone: [phone]   Fax:  [phone]  Physical Therapy Treatment  Patient Details  Name: [name] [name] MRN: [id number] Date of Birth: Jun 22, 1921 Referring Provider: Olevia Perches, DO  Encounter Date: 02/28/2015      PT End of Session - 02/28/15 1712    Visit Number 22   Number of Visits 27   Date for PT Re-Evaluation 03/16/15   Authorization Type 7   Authorization Time Period of 10   PT Start Time 1712   PT Stop Time 1742   PT Time Calculation (min) 30 min   Equipment Utilized During Treatment Gait belt   Activity Tolerance Patient tolerated treatment well;No increased pain;Patient limited by fatigue   Behavior During Therapy [facility] for tasks assessed/performed      Past Medical History  Diagnosis Date  . CAD (coronary artery disease)     nuclear 01/2008, no ischemia, EF 60%, distal anterior scar  . Hyperlipidemia   . Syncope     .remote..no etiology  /  syncope 11/2008.. from acute leg pain  . Venous insufficiency     support hose  . Carotid artery disease (HCC)     doppler 05/2009,  0-39% bilateral / doppler 06/2010 OK  . Elevated CPK     11/23/08.Marland KitchenMarland Kitchenprobably from debris from popliteal aneurysm  . Popliteal aneurysm (HCC)     .repair..11/25/2008  Dr. Darrick Penna  . Speech problem     Neurologic event with brief infusion and speech difficulty.... January, 2011.... being assessed by neurology  February, 2011  . Hx of CABG     1998  . Ejection fraction     EF 45%, echo,05/2009,apical hypo,  ?? posterior and lateral hypo  . Prominent abdominal aortic pulsation     doppler normal  . Intracranial hemorrhage (HCC)     Two small subependymal bleeds 06/2010,,ASA held  . Bradycardia     06/2010  . Edema     Ankle edema August, 2012  . TIA (transient ischemic attack)     Brief episode of confusion and speech difficulty, January, 2012  /  recurrent confusion speech  difficulty September, 2012  . PAD (peripheral artery disease) (HCC)     Above the knee to below the knee popliteal bypass,, right  //    Doppler,  July, 2013, patent  . Fall at home July 2015    Past Surgical History  Procedure Laterality Date  . Coronary artery bypass graft  1998  . Popliteal bypass  with reversed greater saphenous vein from right leg.      Filed Vitals:   02/28/15 1715  SpO2: 96%    Visit Diagnosis:  Weakness  Difficulty walking      Subjective Assessment - 02/28/15 1713    Subjective No pain, ate lunch, doing good.    Patient is accompained by: Family member   Limitations Standing;Walking;House hold activities   Patient Stated Goals "To be able to walk again."   Currently in Pain? No/denies   Multiple Pain Sites No      Objectives:  Directed patient with sit <> stand with Min to mod A to stand, mod A to sit, 3x from wc and throughout session CGA to mod A  Static standing with upright posture to stretch anterior hip muscles  2x 10-15 seconds, bilateral UE assist,   Static standing balance with R UE assist from rw 30 seconds, 35 seconds,  Seated manually resisted leg press 10x3   Seated bilateral knee extension stretch 2 minutes with 5 second quad sets throughout (pt was recommended to perform this stretch at home, legs propped on a stool, 2 minutes at a time; pt and daughter verbalized understanding)  Gait with rw CGA +1 32 ft (97% SpO2), 86 ft (93 %, increased to 98 % SpO2 room air after seated upright posture, and bilateral scapular retraction)   Improved exercise technique, movement at target joints, use of target muscles after mod verbal, visual, tactile cues.     Shorter distances with gait today secondary to fatigue. Rest breaks provided.  Today's session is also later than his usual sessions.                              PT Education - 02/28/15 1720    Education provided Yes   Education Details ther-ex    [employer]) Educated Patient   Methods Explanation;Demonstration;Tactile cues;Verbal cues   Comprehension Verbalized understanding;Returned demonstration             PT Long Term Goals - 01/31/15 1750    PT LONG TERM GOAL #1   Title Patient will demonstrate improved TUG score of  1 min  to decrease fall risk   Baseline 1 min 19 sec   Time 6   Period Weeks   Status Revised   PT LONG TERM GOAL #2   Title Patient will be able to ambulate with rw at least 130 ft with CGA +1 (second person pushing wheel chair) without rest to promote ability to ambulate around the house.    Baseline --   Time 6   Period Weeks   Status Revised   PT LONG TERM GOAL #3   Title Patient will demonstrate 5x STS in 1 min 45 seconds for improved strength and mobility at home.    Time 6   Period Weeks   Status Revised   PT LONG TERM GOAL #4   Title Patient will improve LEFS score to > 25/80 demonstrating improved function.   Baseline 19/80   Time 8   Period Weeks   Status On-going               Plan - 02/28/15 1722    Clinical Impression Statement Shorter distances with gait today secondary to fatigue. Rest breaks provided.  Today's session is also later than his usual sessions.    Pt will benefit from skilled therapeutic intervention in order to improve on the following deficits Decreased activity tolerance;Decreased balance;Impaired flexibility;Decreased mobility   Rehab Potential Fair   PT Frequency 2x / week   PT Duration 6 weeks   PT Treatment/Interventions ADLs/Self Care Home Management;Gait training;Therapeutic activities;Therapeutic exercise;Balance training;Wheelchair mobility training;Patient/family education;Passive range of motion   PT Next Visit Plan Strengthening, gait, stretching of LEs. balance   Consulted and Agree with Plan of Care Patient;Family member/caregiver   Family Member Consulted one of pt daughters        Problem List Patient Active Problem List   Diagnosis  Date Noted  . Pneumonia 02/09/2015  . 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 Discoloration of skin of foot-Left 10/13/2013  . PAD (peripheral artery disease) (HCC)   . TIA (transient ischemic attack)   . Edema   . Hyperlipidemia   . Syncope   . CAD (coronary artery disease)   . Venous insufficiency   . Carotid artery disease (HCC)   .  Elevated CPK   . Popliteal aneurysm (HCC)   . Speech problem   . Hx of CABG   . Ejection fraction   . Prominent abdominal aortic pulsation   . Intracranial hemorrhage (HCC)   . Bradycardia     Loralyn FreshwaterMiguel Leba Tibbitts PT, DPT  02/28/2015, 7:32 PM  [facility] [address] [place], [place], [address] Phone: [phone]   Fax:  [phone]  Name: Darden Palmerlbert L Tash MRN: [id number] Date of Birth: 01/19/1922

## 2015-03-02 ENCOUNTER — Ambulatory Visit: Payer: Medicare Other

## 2015-03-02 DIAGNOSIS — R531 Weakness: Secondary | ICD-10-CM | POA: Diagnosis not present

## 2015-03-02 DIAGNOSIS — R262 Difficulty in walking, not elsewhere classified: Secondary | ICD-10-CM

## 2015-03-02 NOTE — Therapy (Signed)
[facility] [address] [place], [place], [address] Phone: [phone]   Fax:  [phone]  Physical Therapy Treatment And Progress Report (12/05/14 - 03/02/15)  Patient Details  Name: [name] [name] MRN: [id number] Date of Birth: 04/06/1921 Referring Provider: Olevia PerchesMegan Johnson, DO  Encounter Date: 03/02/2015      PT End of Session - 03/02/15 1706    Visit Number 23   Number of Visits 27   Date for PT Re-Evaluation 03/16/15   Authorization Type 1   Authorization Time Period of 10   PT Start Time 1706   PT Stop Time 1751   PT Time Calculation (min) 45 min   Equipment Utilized During Treatment Gait belt   Activity Tolerance Patient tolerated treatment well;No increased pain;Patient limited by fatigue   Behavior [facility] Therapy [facility] for tasks assessed/performed      Past Medical History  Diagnosis Date  . CAD (coronary artery disease)     nuclear 01/2008, no ischemia, EF 60%, distal anterior scar  . Hyperlipidemia   . Syncope     .remote..no etiology  /  syncope 11/2008.. from acute leg pain  . Venous insufficiency     support hose  . Carotid artery disease (HCC)     doppler 05/2009,  0-39% bilateral / doppler 06/2010 OK  . Elevated CPK     11/23/08.Marland Kitchen.Marland Kitchen.probably from debris from popliteal aneurysm  . Popliteal aneurysm (HCC)     .repair..11/25/2008  Dr. Darrick PennaFields  . Speech problem     Neurologic event with brief infusion and speech difficulty.... January, 2011.... being assessed by neurology  February, 2011  . Hx of CABG     1998  . Ejection fraction     EF 45%, echo,05/2009,apical hypo,  ?? posterior and lateral hypo  . Prominent abdominal aortic pulsation     doppler normal  . Intracranial hemorrhage (HCC)     Two small subependymal bleeds 06/2010,,ASA held  . Bradycardia     06/2010  . Edema     Ankle edema August, 2012  . TIA (transient ischemic attack)     Brief episode of confusion and speech difficulty,  January, 2012  /  recurrent confusion speech difficulty September, 2012  . PAD (peripheral artery disease) (HCC)     Above the knee to below the knee popliteal bypass,, right  //    Doppler,  July, 2013, patent  . Fall at home July 2015    Past Surgical History  Procedure Laterality Date  . Coronary artery bypass graft  1998  . Popliteal bypass  with reversed greater saphenous vein from right leg.      Filed Vitals:   03/02/15 1706  SpO2: 96%    Visit Diagnosis:  Weakness  Difficulty walking      Subjective Assessment - 03/02/15 1707    Subjective No pain or discomfort. Ate lunch.    Patient is accompained by: Family member   Limitations Standing;Walking;House hold activities   Patient Stated Goals "To be able to walk again."   Currently in Pain? No/denies      Objectives:   Directed patient with sit <> stand with CGA to min A 3x, min A to sit; and throughout session  Static standing with and without UE assist from rw  Standing up from the chair, walking 10 ft forward, then 10 ft to return to the chair, then stand to sit 3x. Cues for staying in rw, increasing hip and knee  flexion, foot clearance, and making sure pt is in front of chair prior to sitting.   Seated bilateral knee extension stretch 2 minutes with 5 second quad sets throughout    Then static stretch 2 minutes  Standing mini squats 10x2 with bilateral UE assist from rw  Seated manually resisted leg press 10x3 each LE  Reviewed progress/current status with TUG with pt.   Improved exercise technique, movement at target joints, use of target muscles after mod to max verbal, visual, tactile cues.     Patient has demonstrated improved TUG times today compared to baseline time of 1 min 19 seconds, and 01/31/15 times of 1 min 43 seconds, and 1 min 22 seconds. No change in LEFS score overall. Limiting factors to progress include bilateral LE swelling, deconditioning, fatigue, weakness. Patient will benefit  from continued physical therapy intervention to promote functional strength, and ability to ambulate and perform transfers.         [facility] PT Assessment - 03/02/15 1718    Observation/Other Assessments   Observations TUG: 1 min 6 sec; 1 min 8 sec; 1 min 16 seconds (98% SpO2). CGA to mod independent to stand, min assist to sit.    Lower Extremity Functional Scale  19/80 (daughter [name] (was with him during initial evaluation) helped fill survey out)                             PT Education - 03/02/15 1710    Education provided Yes   Education Details ther-ex, progress/current status with TUG   Person(s) Educated Patient   Methods Explanation;Demonstration;Tactile cues;Verbal cues   Comprehension Verbalized understanding;Returned demonstration             PT Long Term Goals - 03/02/15 1806    PT LONG TERM GOAL #1   Title Patient will demonstrate improved TUG score of  1 min  to decrease fall risk   Baseline 1 min 19 sec   Time 6   Period Weeks   Status On-going   PT LONG TERM GOAL #2   Title Patient will be able to ambulate with rw at least 130 ft with CGA +1 (second person pushing wheel chair) without rest to promote ability to ambulate around the house.    Time 6   Period Weeks   Status On-going   PT LONG TERM GOAL #3   Title Patient will demonstrate 5x STS in 1 min 45 seconds for improved strength and mobility at home.    Time 6   Period Weeks   Status Unable to assess   PT LONG TERM GOAL #4   Title Patient will improve LEFS score to > 25/80 demonstrating improved function.   Baseline 19/80   Time 8   Period Weeks   Status On-going               Plan - 03/02/15 1709    Clinical Impression Statement Patient has demonstrated improved TUG times today compared to baseline time of 1 min 19 seconds, and 01/31/15 times of 1 min 43 seconds, and 1 min 22 seconds. No change in LEFS score overall. Limiting factors to progress include  bilateral LE swelling, deconditioning, fatigue, weakness. Patient will benefit from continued physical therapy intervention to promote functional strength, and ability to ambulate and perform transfers.    Pt will benefit from skilled therapeutic intervention in order to improve on the following deficits Decreased activity tolerance;Decreased balance;Impaired flexibility;Decreased  mobility   Rehab Potential Fair   PT Frequency 2x / week   PT Duration 6 weeks   PT Treatment/Interventions ADLs/Self Care Home Management;Gait training;Therapeutic activities;Therapeutic exercise;Balance training;Wheelchair mobility training;Patient/family education;Passive range of motion   PT Next Visit Plan Strengthening, gait, stretching of LEs. balance   Consulted and Agree with Plan of Care Patient;Family member/caregiver   Family Member Consulted one of pt daughters          G-Codes - 2015-03-22 1804-07-21    Functional Assessment Tool Used  TUG, gait, clinical presentation   Functional Limitation Mobility: Walking and moving around   Mobility: Walking and Moving Around Current Status [phone]) At least 60 percent but less than 80 percent impaired, limited or restricted   Mobility: Walking and Moving Around Goal Status [phone]) At least 40 percent but less than 60 percent impaired, limited or restricted      Problem List Patient Active Problem List   Diagnosis Date Noted  . Pneumonia 02/09/2015  . 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 Discoloration of skin of foot-Left 10/13/2013  . PAD (peripheral artery disease) (HCC)   . TIA (transient ischemic attack)   . Edema   . Hyperlipidemia   . Syncope   . CAD (coronary artery disease)   . Venous insufficiency   . Carotid artery disease (HCC)   . Elevated CPK   . Popliteal aneurysm (HCC)   . Speech problem   . Hx of CABG   . Ejection fraction   . Prominent abdominal aortic pulsation   . Intracranial hemorrhage (HCC)   . Bradycardia    Thank you  for your referral.    Loralyn Freshwater PT, DPT   2015-03-22, 6:20 PM  [facility]ICINE Jul 21, [address] [place], [address] Phone: [phone]   Fax:  [phone]  Name: [name] [name] MRN: [id number] Date of Birth: [birth year]

## 2015-03-07 ENCOUNTER — Ambulatory Visit: Payer: Medicare Other | Attending: Family Medicine

## 2015-03-07 DIAGNOSIS — R531 Weakness: Secondary | ICD-10-CM | POA: Diagnosis present

## 2015-03-07 DIAGNOSIS — R262 Difficulty in walking, not elsewhere classified: Secondary | ICD-10-CM | POA: Insufficient documentation

## 2015-03-08 NOTE — Therapy (Signed)
[facility] [address] , [place], [address] Phone: [phone]   Fax:  [phone]  Physical Therapy Treatment  Patient Details  Name: [name] [name] MRN: [id number] Date of Birth: 05/17/1921 Referring Provider: Olevia PerchesMegan Johnson, DO  Encounter Date: 03/07/2015      PT End of Session - 03/08/15 1710    Visit Number 24   Number of Visits 27   Date for PT Re-Evaluation 03/16/15   Authorization Type 2   Authorization Time Period of 10   PT Start Time 1500   PT Stop Time 1535   PT Time Calculation (min) 35 min   Equipment Utilized During Treatment Gait belt   Activity Tolerance Patient tolerated treatment well;No increased pain;Patient limited by fatigue   Behavior [facility] Therapy [facility] for tasks assessed/performed      Past Medical History  Diagnosis Date  . CAD (coronary artery disease)     nuclear 01/2008, no ischemia, EF 60%, distal anterior scar  . Hyperlipidemia   . Syncope     .remote..no etiology  /  syncope 11/2008.. from acute leg pain  . Venous insufficiency     support hose  . Carotid artery disease (HCC)     doppler 05/2009,  0-39% bilateral / doppler 06/2010 OK  . Elevated CPK     11/23/08.Marland Kitchen.Marland Kitchen.probably from debris from popliteal aneurysm  . Popliteal aneurysm (HCC)     .repair..11/25/2008  Dr. Darrick PennaFields  . Speech problem     Neurologic event with brief infusion and speech difficulty.... January, 2011.... being assessed by neurology  February, 2011  . Hx of CABG     1998  . Ejection fraction     EF 45%, echo,05/2009,apical hypo,  ?? posterior and lateral hypo  . Prominent abdominal aortic pulsation     doppler normal  . Intracranial hemorrhage (HCC)     Two small subependymal bleeds 06/2010,,ASA held  . Bradycardia     06/2010  . Edema     Ankle edema August, 2012  . TIA (transient ischemic attack)     Brief episode of confusion and speech difficulty, January, 2012  /  recurrent confusion speech  difficulty September, 2012  . PAD (peripheral artery disease) (HCC)     Above the knee to below the knee popliteal bypass,, right  //    Doppler,  July, 2013, patent  . Fall at home July 2015    Past Surgical History  Procedure Laterality Date  . Coronary artery bypass graft  1998  . Popliteal bypass  with reversed greater saphenous vein from right leg.      There were no vitals filed for this visit.  Visit Diagnosis:  Weakness  Difficulty walking      Subjective Assessment - 03/08/15 1706    Subjective Pt denies pain or discomfort at this time. No reported falls since last therapy session. No questions or concerns currently.    Patient is accompained by: Family member  Daughter and wife   Limitations Standing;Walking;House hold activities   How long can you walk comfortably? 2 min   Patient Stated Goals "To be able to walk again."   Currently in Pain? No/denies       THER-EX NuStep L1 x 2 minutes for warm-up during history; Sit to stand from mat table without UE support x 10, x 5 (lower height); Standing mini squats x 10 with UE support on walker; Standing side stepping at edge of mat table x  3 steps without UE support, min to modA+1 support from therapist with LE buckling noted; Seated marches x 10, 2# ankle weights x 10; Seated LAQ x 10, 2# ankle weights x 10; Supine SLR 2 x 10 bilateral; Supine resisted leg extension 2 x 10 bilateral; Supine bridges x 10;                           PT Education - 03/08/15 1709    Education provided Yes   Education Details ther-ex   Person(s) Educated Patient   Methods Explanation;Demonstration   Comprehension Verbalized understanding;Returned demonstration             PT Long Term Goals - 03/02/15 1806    PT LONG TERM GOAL #1   Title Patient will demonstrate improved TUG score of  1 min  to decrease fall risk   Baseline 1 min 19 sec   Time 6   Period Weeks   Status On-going   PT LONG TERM GOAL  #2   Title Patient will be able to ambulate with rw at least 130 ft with CGA +1 (second person pushing wheel chair) without rest to promote ability to ambulate around the house.    Time 6   Period Weeks   Status On-going   PT LONG TERM GOAL #3   Title Patient will demonstrate 5x STS in 1 min 45 seconds for improved strength and mobility at home.    Time 6   Period Weeks   Status Unable to assess   PT LONG TERM GOAL #4   Title Patient will improve LEFS score to > 25/80 demonstrating improved function.   Baseline 19/80   Time 8   Period Weeks   Status On-going               Plan - 03/08/15 1710    Clinical Impression Statement Pt demonstrates deconditioning and LE weakness during session. Pt elects to end the session early due to fatigue. Vitals monitored and SaO2 remains >90% during entire session including good control of HR. Considerable bilateral LE swelling noted. Pt will benefit from continued skilled PT services to address deficits and improve function at home.   Pt will benefit from skilled therapeutic intervention in order to improve on the following deficits Decreased activity tolerance;Decreased balance;Impaired flexibility;Decreased mobility   Rehab Potential Fair   PT Frequency 2x / week   PT Duration 6 weeks   PT Treatment/Interventions ADLs/Self Care Home Management;Gait training;Therapeutic activities;Therapeutic exercise;Balance training;Wheelchair mobility training;Patient/family education;Passive range of motion   PT Next Visit Plan Strengthening, gait, stretching of LEs. balance   PT Home Exercise Plan Continue as prescribed   Consulted and Agree with Plan of Care Patient;Family member/caregiver   Family Member Consulted one of pt daughters        Problem List Patient Active Problem List   Diagnosis Date Noted  . Pneumonia 02/09/2015  . 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 Discoloration of skin of foot-Left 10/13/2013  . PAD (peripheral  artery disease) (HCC)   . TIA (transient ischemic attack)   . Edema   . Hyperlipidemia   . Syncope   . CAD (coronary artery disease)   . Venous insufficiency   . Carotid artery disease (HCC)   . Elevated CPK   . Popliteal aneurysm (HCC)   . Speech problem   . Hx of CABG   . Ejection fraction   . Prominent abdominal aortic pulsation   .  Intracranial hemorrhage (HCC)   . Bradycardia    Lynnea Maizes PT, DPT   Huprich,Jason 03/08/2015, 5:12 PM  [facility] PHYSICAL AND SPORTS MEDICINE [address] [place], [address] Phone: [phone]   Fax:  [phone]  Name: [name] [name] MRN: [id number] Date of Birth: 03-09-1921

## 2015-03-09 ENCOUNTER — Ambulatory Visit: Payer: Medicare Other

## 2015-03-09 DIAGNOSIS — R262 Difficulty in walking, not elsewhere classified: Secondary | ICD-10-CM

## 2015-03-09 DIAGNOSIS — R531 Weakness: Secondary | ICD-10-CM | POA: Diagnosis not present

## 2015-03-10 NOTE — Therapy (Signed)
[facility] PHYSICAL AND SPORTS MEDICINE [address] [place], [place], [address] Phone: [phone]   Fax:  [phone]  Physical Therapy Treatment  Patient Details  Name: [name] [name] MRN: [id number] Date of Birth: 09/02/1921 Referring Provider: Olevia PerchesMegan Johnson, DO  Encounter Date: 03/09/2015      PT End of Session - 03/10/15 1203    Visit Number 25   Number of Visits 27   Date for PT Re-Evaluation 03/16/15   Authorization Type 3   Authorization Time Period of 10   PT Start Time 1500   PT Stop Time 1530   PT Time Calculation (min) 30 min   Equipment Utilized During Treatment Gait belt   Activity Tolerance Patient tolerated treatment well;No increased pain;Patient limited by fatigue   Behavior [facility] assessed/performed      Past Medical History  Diagnosis Date  . CAD (coronary artery disease)     nuclear 01/2008, no ischemia, EF 60%, distal anterior scar  . Hyperlipidemia   . Syncope     .remote..no etiology  /  syncope 11/2008.. from acute leg pain  . Venous insufficiency     support hose  . Carotid artery disease (HCC)     doppler 05/2009,  0-39% bilateral / doppler 06/2010 OK  . Elevated CPK     11/23/08.Marland Kitchen.Marland Kitchen.probably from debris from popliteal aneurysm  . Popliteal aneurysm (HCC)     .repair..11/25/2008  Dr. Darrick PennaFields  . Speech problem     Neurologic event with brief infusion and speech difficulty.... January, 2011.... being assessed by neurology  February, 2011  . Hx of CABG     1998  . Ejection fraction     EF 45%, echo,05/2009,apical hypo,  ?? posterior and lateral hypo  . Prominent abdominal aortic pulsation     doppler normal  . Intracranial hemorrhage (HCC)     Two small subependymal bleeds 06/2010,,ASA held  . Bradycardia     06/2010  . Edema     Ankle edema August, 2012  . TIA (transient ischemic attack)     Brief episode of confusion and speech difficulty, January, 2012  /  recurrent confusion speech  difficulty September, 2012  . PAD (peripheral artery disease) (HCC)     Above the knee to below the knee popliteal bypass,, right  //    Doppler,  July, 2013, patent  . Fall at home July 2015    Past Surgical History  Procedure Laterality Date  . Coronary artery bypass graft  1998  . Popliteal bypass  with reversed greater saphenous vein from right leg.      There were no vitals filed for this visit.  Visit Diagnosis:  Weakness  Difficulty walking      Subjective Assessment - 03/10/15 1201    Subjective Pt reports he is dong well at this time. Family states that pt was fatigued and sore after last therapy session. Pt denies pain at this time.    Patient is accompained by: Family member  Daughter and wife   Limitations Standing;Walking;House hold activities   How long can you walk comfortably? 2 min   Patient Stated Goals "To be able to walk again."   Currently in Pain? No/denies       THER-EX NuStep L2 x 4 minutes for warm-up during history (2 minutes unbilled); Ambulation from NuStep to mat table in gym, approximately; Seated marches x 10, 2# ankle weights x 10; Seated LAQ x 10, 2# ankle  weights x 10; Seated clamshells with manual resistance 2 x 10; Seated hip adduction with manual resistance 2 x 10; Sit to stand from mat table without UE support 2 x 5; Ambulation in gym x 50' with wheelchair follow and cues to stay in walker and for upright posture.                           PT Education - 03/10/15 1202    Education provided Yes   Education Details Reinforced HEP   Person(s) Educated Patient;Child(ren)   Methods Explanation   Comprehension Verbalized understanding;Returned demonstration             PT Long Term Goals - 03/02/15 1806    PT LONG TERM GOAL #1   Title Patient will demonstrate improved TUG score of  1 min  to decrease fall risk   Baseline 1 min 19 sec   Time 6   Period Weeks   Status On-going   PT LONG TERM GOAL #2    Title Patient will be able to ambulate with rw at least 130 ft with CGA +1 (second person pushing wheel chair) without rest to promote ability to ambulate around the house.    Time 6   Period Weeks   Status On-going   PT LONG TERM GOAL #3   Title Patient will demonstrate 5x STS in 1 min 45 seconds for improved strength and mobility at home.    Time 6   Period Weeks   Status Unable to assess   PT LONG TERM GOAL #4   Title Patient will improve LEFS score to > 25/80 demonstrating improved function.   Baseline 19/80   Time 8   Period Weeks   Status On-going               Plan - 03/10/15 1203    Clinical Impression Statement Pt continues to demonstrate deconditioning, weakness, and poor sequencing during transfers. He is able to increase ambulation distance today but appears more easily fatigued. Vitals remain WNL during entire session. Pt fatigued and elects to end therapy session early on ths date. Pt encouraged to continue HEP and follow-up as scheduled.    Pt will benefit from skilled therapeutic intervention in order to improve on the following deficits Decreased activity tolerance;Decreased balance;Impaired flexibility;Decreased mobility   Rehab Potential Fair   PT Frequency 2x / week   PT Duration 6 weeks   PT Treatment/Interventions ADLs/Self Care Home Management;Gait training;Therapeutic activities;Therapeutic exercise;Balance training;Wheelchair mobility training;Patient/family education;Passive range of motion   PT Next Visit Plan Strengthening, gait, stretching of LEs. balance   PT Home Exercise Plan Continue as prescribed   Consulted and Agree with Plan of Care Patient;Family member/caregiver   Family Member Consulted one of pt daughters        Problem List Patient Active Problem List   Diagnosis Date Noted  . Pneumonia 02/09/2015  . 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 Discoloration of skin of foot-Left 10/13/2013  . PAD (peripheral artery disease)  (HCC)   . TIA (transient ischemic attack)   . Edema   . Hyperlipidemia   . Syncope   . CAD (coronary artery disease)   . Venous insufficiency   . Carotid artery disease (HCC)   . Elevated CPK   . Popliteal aneurysm (HCC)   . Speech problem   . Hx of CABG   . Ejection fraction   . Prominent abdominal aortic pulsation   .  Intracranial hemorrhage (HCC)   . Bradycardia    Lynnea Maizes PT, DPT   Darrell Hauk 03/10/2015, 12:06 PM  [facility] PHYSICAL AND SPORTS MEDICINE [address] [place], [address] Phone: [phone]   Fax:  [phone]  Name: [name] [name] MRN: [id number] Date of Birth: 1921-06-17

## 2015-03-16 ENCOUNTER — Ambulatory Visit: Payer: Medicare Other

## 2015-03-16 DIAGNOSIS — R262 Difficulty in walking, not elsewhere classified: Secondary | ICD-10-CM

## 2015-03-16 DIAGNOSIS — R531 Weakness: Secondary | ICD-10-CM | POA: Diagnosis not present

## 2015-03-16 NOTE — Therapy (Signed)
[facility] [address] [place], [place], [address] Phone: [phone]   Fax:  [phone]  Physical Therapy Treatment And Progress Report  Patient Details  Name: [name] [name] MRN: [id number] Date of Birth: 02/27/1922 Referring Provider: Olevia PerchesMegan Johnson, DO  Encounter Date: 03/16/2015      PT End of Session - 03/16/15 1653    Visit Number 26   Number of Visits 35   Date for PT Re-Evaluation 04/13/15   Authorization Type 4   Authorization Time Period of 10   PT Start Time 1652   PT Stop Time 1734   PT Time Calculation (min) 42 min   Equipment Utilized During Treatment Gait belt   Activity Tolerance Patient tolerated treatment well;No increased pain;Patient limited by fatigue   Behavior During Therapy [facility] for tasks assessed/performed      Past Medical History  Diagnosis Date  . CAD (coronary artery disease)     nuclear 01/2008, no ischemia, EF 60%, distal anterior scar  . Hyperlipidemia   . Syncope     .remote..no etiology  /  syncope 11/2008.. from acute leg pain  . Venous insufficiency     support hose  . Carotid artery disease (HCC)     doppler 05/2009,  0-39% bilateral / doppler 06/2010 OK  . Elevated CPK     11/23/08.Marland Kitchen.Marland Kitchen.probably from debris from popliteal aneurysm  . Popliteal aneurysm (HCC)     .repair..11/25/2008  Dr. Darrick PennaFields  . Speech problem     Neurologic event with brief infusion and speech difficulty.... January, 2011.... being assessed by neurology  February, 2011  . Hx of CABG     1998  . Ejection fraction     EF 45%, echo,05/2009,apical hypo,  ?? posterior and lateral hypo  . Prominent abdominal aortic pulsation     doppler normal  . Intracranial hemorrhage (HCC)     Two small subependymal bleeds 06/2010,,ASA held  . Bradycardia     06/2010  . Edema     Ankle edema August, 2012  . TIA (transient ischemic attack)     Brief episode of confusion and speech difficulty, January, 2012  /   recurrent confusion speech difficulty September, 2012  . PAD (peripheral artery disease) (HCC)     Above the knee to below the knee popliteal bypass,, right  //    Doppler,  July, 2013, patent  . Fall at home July 2015    Past Surgical History  Procedure Laterality Date  . Coronary artery bypass graft  1998  . Popliteal bypass  with reversed greater saphenous vein from right leg.      Filed Vitals:   03/16/15 1654  SpO2: 96%    Visit Diagnosis:  Weakness - Plan: PT plan of care cert/re-cert  Difficulty walking - Plan: PT plan of care cert/re-cert      Subjective Assessment - 03/16/15 1701    Subjective Pt and daughter states R anterior medial knee discomfort during the weekend.  R knee not bothering him today. Pt states eating lunch today.    Patient is accompained by: Family member  Daughter and wife   Limitations Standing;Walking;House hold activities   How long can you walk comfortably? 2 min   Patient Stated Goals "To be able to walk again."   Currently in Pain? No/denies   Multiple Pain Sites No            [facility] PT Assessment - 03/16/15 1657  Observation/Other Assessments   Observations TUG: 1 min 6 sec; 1 min 8 sec; 1 min 16 seconds (98% SpO2). CGA to mod independent to stand, min assist to sit (measured on 03/02/2015). 5 times sit to stand measured today: 1 min 38 seconds, CGA   Lower Extremity Functional Scale  19/80 (daughter [name] (was with him during initial evaluation) helped fill survey out)  measured on 03/02/2015       Objectives  There-ex  Directed patient with sit <> stand 5x, then throughout session CGA  Static standing balance 60 seconds with one UE to no UE assist, then 30 seconds without UE assist  Knee extension stretch x 2 min  Bilateral gastroc stretch x 1 min each   Gait 109 ft (95% SpO2) and 120 ft (SpO2 92% which increased to 98% after rest) CGA +1.    Seated bilateral scapular retraction resisting red band  10x3  Reviewed progress/current status with PT with patient and daughter Luster Landsberg)     Improved exercise technique, movement at target joints, use of target muscles after mod verbal, visual, tactile cues.      Patient has demonstrated improved ability to perform 5 times sit <> stand, meeting his long term goal. He also demonstrates improved ability to perform his Timed Up and Go test measured on 03/02/15 compared to previous attempts. Pt experienced a minor set back with gait due to his pneumonia early December 2016 but is now able to ambulate up to 120 ft with CGA +1 today. Patient making progress towards goals. He still demonstrates LE weakness, and difficulty walking.  Limiting factors to progress include bilateral LE swelling, deconditioning, fatigue, weakness. Patient will benefit from continued physical therapy intervention to promote functional strength, and ability to ambulate and perform transfers.         PT Education - 03/16/15 1709    Education provided Yes   Education Details ther-ex, progress/current status with PT, plan of care   Person(s) Educated Patient;Child(ren)  daughter Luster Landsberg)   Methods Explanation;Demonstration;Tactile cues;Verbal cues   Comprehension Verbalized understanding;Returned demonstration             PT Long Term Goals - 03/16/15 1712    PT LONG TERM GOAL #1   Title Patient will demonstrate improved TUG score of  1 min  to decrease fall risk   Baseline 1 min 19 sec   Time 4   Period Weeks   Status On-going   PT LONG TERM GOAL #2   Title Patient will be able to ambulate with rw at least 130 ft with CGA +1 (second person pushing wheel chair) without rest to promote ability to ambulate around the house.    Time 4   Period Weeks   Status On-going   PT LONG TERM GOAL #3   Title Patient will demonstrate 5x STS in 1 min 45 seconds for improved strength and mobility at home.    Time 4   Period Weeks   Status Achieved   PT LONG TERM GOAL #4    Title Patient will improve LEFS score to > 25/80 demonstrating improved function.   Baseline 19/80   Time 4   Period Weeks   Status On-going               Plan - 03/16/15 1711    Clinical Impression Statement Patient has demonstrated improved ability to perform 5 times sit <> stand, meeting his long term goal. He also demonstrates improved ability to perform his Timed  Up and Go test measured on 03/02/15 compared to previous attempts. Pt experienced a minor set back with gait due to his pneumonia early December 2016 but is now able to ambulate up to 120 ft with CGA +1 today. Patient making progress towards goals. He still demonstrates LE weakness, and difficulty walking.  Limiting factors to progress include bilateral LE swelling, deconditioning, fatigue, weakness. Patient will benefit from continued physical therapy intervention to promote functional strength, and ability to ambulate and perform transfers.    Pt will benefit from skilled therapeutic intervention in order to improve on the following deficits Decreased activity tolerance;Decreased balance;Impaired flexibility;Decreased mobility   Rehab Potential Fair   PT Frequency 2x / week   PT Duration 4 weeks   PT Treatment/Interventions ADLs/Self Care Home Management;Gait training;Therapeutic activities;Therapeutic exercise;Balance training;Wheelchair mobility training;Patient/family education;Passive range of motion   PT Next Visit Plan Strengthening, gait, stretching of LEs. balance   PT Home Exercise Plan Continue as prescribed   Consulted and Agree with Plan of Care Patient;Family member/caregiver   Family Member Consulted one of pt daughters        Problem List Patient Active Problem List   Diagnosis Date Noted  . Pneumonia 02/09/2015  . 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 Discoloration of skin of foot-Left 10/13/2013  . PAD (peripheral artery disease) (HCC)   . TIA (transient ischemic attack)   . Edema   .  Hyperlipidemia   . Syncope   . CAD (coronary artery disease)   . Venous insufficiency   . Carotid artery disease (HCC)   . Elevated CPK   . Popliteal aneurysm (HCC)   . Speech problem   . Hx of CABG   . Ejection fraction   . Prominent abdominal aortic pulsation   . Intracranial hemorrhage (HCC)   . Bradycardia    Thank you for your referral.   Loralyn Freshwater PT, DPT   03/16/2015, 7:10 PM  [facility] PHYSICAL AND SPORTS MEDICINE [address], [place], [address] Phone: [phone]   Fax:  [phone]  Name: DILLIAN FEIG MRN: [id number] Date of Birth: 01/15/1922

## 2015-03-21 ENCOUNTER — Ambulatory Visit: Payer: Medicare Other

## 2015-03-21 DIAGNOSIS — R531 Weakness: Secondary | ICD-10-CM

## 2015-03-21 DIAGNOSIS — R262 Difficulty in walking, not elsewhere classified: Secondary | ICD-10-CM

## 2015-03-21 NOTE — Therapy (Signed)
[facility]ORTS MEDICINE [address] [place], [address] Phone: [phone]   Fax:  [phone]  Physical Therapy Treatment  Patient Details  Name: [name] [name] MRN: [id number] Date of Birth: 10/10/1921 Referring Provider: Olevia Perches, DO  Encounter Date: 03/21/2015      PT End of Session - 03/21/15 1648    Visit Number 27   Number of Visits 35   Date for PT Re-Evaluation 04/13/15   Authorization Type 5   Authorization Time Period of 10   PT Start Time 1649   PT Stop Time 1732   PT Time Calculation (min) 43 min   Equipment Utilized During Treatment Gait belt   Activity Tolerance Patient tolerated treatment well;No increased pain;Patient limited by fatigue   Behavior During Therapy [facility] for tasks assessed/performed      Past Medical History  Diagnosis Date  . CAD (coronary artery disease)     nuclear 01/2008, no ischemia, EF 60%, distal anterior scar  . Hyperlipidemia   . Syncope     .remote..no etiology  /  syncope 11/2008.. from acute leg pain  . Venous insufficiency     support hose  . Carotid artery disease (HCC)     doppler 05/2009,  0-39% bilateral / doppler 06/2010 OK  . Elevated CPK     11/23/08.Marland KitchenMarland Kitchenprobably from debris from popliteal aneurysm  . Popliteal aneurysm (HCC)     .repair..11/25/2008  Dr. Darrick Penna  . Speech problem     Neurologic event with brief infusion and speech difficulty.... January, 2011.... being assessed by neurology  February, 2011  . Hx of CABG     1998  . Ejection fraction     EF 45%, echo,05/2009,apical hypo,  ?? posterior and lateral hypo  . Prominent abdominal aortic pulsation     doppler normal  . Intracranial hemorrhage (HCC)     Two small subependymal bleeds 06/2010,,ASA held  . Bradycardia     06/2010  . Edema     Ankle edema August, 2012  . TIA (transient ischemic attack)     Brief episode of confusion and speech difficulty, January, 2012  /  recurrent confusion speech  difficulty September, 2012  . PAD (peripheral artery disease) (HCC)     Above the knee to below the knee popliteal bypass,, right  //    Doppler,  July, 2013, patent  . Fall at home July 2015    Past Surgical History  Procedure Laterality Date  . Coronary artery bypass graft  1998  . Popliteal bypass  with reversed greater saphenous vein from right leg.      There were no vitals filed for this visit.  Visit Diagnosis:  Weakness  Difficulty walking      Subjective Assessment - 03/21/15 1651    Subjective No knee pain. At lunch   Patient is accompained by: Family member  Daughter and wife   Limitations Standing;Walking;House hold activities   How long can you walk comfortably? 2 min   Patient Stated Goals "To be able to walk again."   Currently in Pain? No/denies   Multiple Pain Sites No      Objectives   There-ex   SpO2 96% room air  Directed patient with sit <> stand 5x with min to mod assist, then throughout session with level of assist varying from CGA to mod A  Standing mini squats 10x3  Seated bilateral scapular retraction 10x3 with 5 second holds resisting red band  Static standing balance 15 seconds with CGA  Knee extension stretch 1 min, then 2 min with 5 second quad sets  Pain in L posterior knee when attempting to perform the Homans sign in sitting on wc (performed prior to calf stretch). Pt and daughter education on possible vascular related issue due to discomfort in posterior L knee when attempting the Homans test, bilateral LE swelling, bilateral LE in the dependent position when pt sits on wheel chair all day, and being sedentary.  No pain when attempted test during previous sessions. Pt and daughter were recommended to talk to his doctor to check out his lower extremities.   Improved exercise technique, movement at target joints, use of target muscles after mod to max verbal, visual, tactile cues.            PT Education - 03/21/15 1658     Education provided Yes   Education Details ther-ex   [employer]) Educated Patient   Methods Explanation;Demonstration;Tactile cues;Verbal cues   Comprehension Verbalized understanding;Returned demonstration             PT Long Term Goals - 03/16/15 1712    PT LONG TERM GOAL #1   Title Patient will demonstrate improved TUG score of  1 min  to decrease fall risk   Baseline 1 min 19 sec   Time 4   Period Weeks   Status On-going   PT LONG TERM GOAL #2   Title Patient will be able to ambulate with rw at least 130 ft with CGA +1 (second person pushing wheel chair) without rest to promote ability to ambulate around the house.    Time 4   Period Weeks   Status On-going   PT LONG TERM GOAL #3   Title Patient will demonstrate 5x STS in 1 min 45 seconds for improved strength and mobility at home.    Time 4   Period Weeks   Status Achieved   PT LONG TERM GOAL #4   Title Patient will improve LEFS score to > 25/80 demonstrating improved function.   Baseline 19/80   Time 4   Period Weeks   Status On-going               Plan - 03/21/15 1659    Clinical Impression Statement Pt demonstrates increased fatigue today compared to other sessions, needing more rest breaks. SpO2 were within normal limits (96% - 97%).  Pain in L posterior knee when attempting to perform the Homans sign (performed prior to calf stretch). Pt and daughter education on possible vascular related issue due to discomfort in posterior L knee when attempting the Homans test, bilateral LE swelling, bilateral LE in the dependent position when pt sits on wheel chair all day, and being sedentary.  No pain when attempted test during previous sessions. Pt and daughter were recommended to talk to his doctor to check out his lower extremities. Pt and daughter verbalized understanding.    Pt will benefit from skilled therapeutic intervention in order to improve on the following deficits Decreased activity tolerance;Decreased  balance;Impaired flexibility;Decreased mobility   Rehab Potential Fair   PT Frequency 2x / week   PT Duration 4 weeks   PT Treatment/Interventions ADLs/Self Care Home Management;Gait training;Therapeutic activities;Therapeutic exercise;Balance training;Wheelchair mobility training;Patient/family education;Passive range of motion   PT Next Visit Plan Strengthening, gait, stretching of LEs. balance   PT Home Exercise Plan Continue as prescribed   Consulted and Agree with Plan of Care Patient;Family member/caregiver   Family Member  Consulted one of pt daughters        Problem List Patient Active Problem List   Diagnosis Date Noted  . Pneumonia 02/09/2015  . 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 Discoloration of skin of foot-Left 10/13/2013  . PAD (peripheral artery disease) (HCC)   . TIA (transient ischemic attack)   . Edema   . Hyperlipidemia   . Syncope   . CAD (coronary artery disease)   . Venous insufficiency   . Carotid artery disease (HCC)   . Elevated CPK   . Popliteal aneurysm (HCC)   . Speech problem   . Hx of CABG   . Ejection fraction   . Prominent abdominal aortic pulsation   . Intracranial hemorrhage (HCC)   . Bradycardia    Loralyn Freshwater PT, DPT   03/21/2015, 7:37 PM  [facility] [address] [facility]AL [facility] PHYSICAL AND SPORTS MEDICINE [address], [place], [address] Phone: [phone]   Fax:  [phone]  Name: [name] [name] MRN: [id number] Date of Birth: 1922-01-07

## 2015-03-23 ENCOUNTER — Ambulatory Visit: Payer: Medicare Other

## 2015-03-28 ENCOUNTER — Ambulatory Visit
Admission: RE | Admit: 2015-03-28 | Discharge: 2015-03-28 | Disposition: A | Discharge status: Home / Self Care | Payer: Medicare Other | Source: Ambulatory Visit | Attending: Family Medicine | Admitting: Family Medicine

## 2015-03-28 ENCOUNTER — Ambulatory Visit: Admission: RE | Admit: 2015-03-28 | Payer: Medicare Other | Source: Ambulatory Visit

## 2015-03-28 ENCOUNTER — Ambulatory Visit: Payer: Medicare Other

## 2015-03-28 ENCOUNTER — Telehealth: Payer: Self-pay

## 2015-03-28 ENCOUNTER — Other Ambulatory Visit: Payer: Self-pay | Admitting: Family Medicine

## 2015-03-28 ENCOUNTER — Ambulatory Visit: Payer: Medicare Other | Admitting: Family Medicine

## 2015-03-28 DIAGNOSIS — M7121 Synovial cyst of popliteal space [Baker], right knee: Secondary | ICD-10-CM | POA: Diagnosis not present

## 2015-03-28 DIAGNOSIS — R2241 Localized swelling, mass and lump, right lower limb: Secondary | ICD-10-CM | POA: Insufficient documentation

## 2015-03-28 DIAGNOSIS — R609 Edema, unspecified: Secondary | ICD-10-CM | POA: Insufficient documentation

## 2015-03-28 DIAGNOSIS — M7122 Synovial cyst of popliteal space [Baker], left knee: Secondary | ICD-10-CM | POA: Diagnosis not present

## 2015-03-28 NOTE — Telephone Encounter (Signed)
Will get him [place] to r/o DVT today and reschedule appointment for tomorrow or Thursday.

## 2015-03-28 NOTE — Telephone Encounter (Signed)
Physical Therapy called and stated that they have been seeing this patient and just wanted to give the doctor some information. They stated they did a homan's test on the patient and that it was positive in the left leg. They also stated that both of the patient's legs are swollen. Just wanted [place] to know so we could check the patient out when he comes for his appointment this afternoon.

## 2015-03-29 ENCOUNTER — Other Ambulatory Visit: Payer: Medicare Other

## 2015-03-29 ENCOUNTER — Telehealth: Payer: Self-pay | Admitting: Family Medicine

## 2015-03-29 NOTE — Telephone Encounter (Signed)
Pt coming in Tuesday 01/31.

## 2015-03-29 NOTE — Telephone Encounter (Signed)
Can we please make sure the [place] are rescheduled after yesterday? Thank you!

## 2015-03-30 ENCOUNTER — Ambulatory Visit: Payer: Medicare Other

## 2015-04-03 ENCOUNTER — Ambulatory Visit (INDEPENDENT_AMBULATORY_CARE_PROVIDER_SITE_OTHER): Payer: Medicare Other | Admitting: Family Medicine

## 2015-04-03 ENCOUNTER — Encounter: Payer: Self-pay | Admitting: Family Medicine

## 2015-04-03 VITALS — BP 113/77 | HR 94 | Temp 96.5°F

## 2015-04-03 DIAGNOSIS — R5381 Other malaise: Secondary | ICD-10-CM

## 2015-04-03 DIAGNOSIS — J181 Lobar pneumonia, unspecified organism: Principal | ICD-10-CM

## 2015-04-03 DIAGNOSIS — R2241 Localized swelling, mass and lump, right lower limb: Secondary | ICD-10-CM | POA: Diagnosis not present

## 2015-04-03 DIAGNOSIS — J189 Pneumonia, unspecified organism: Secondary | ICD-10-CM | POA: Diagnosis not present

## 2015-04-03 MED ORDER — LEVOFLOXACIN 500 MG PO TABS: 500.0000 mg | ORAL_TABLET | Freq: Every day | ORAL | Status: DC

## 2015-04-03 NOTE — Assessment & Plan Note (Signed)
Will treat with levaqiun and recheck in 2 weeks to see how he is doing.

## 2015-04-03 NOTE — Progress Notes (Signed)
BP 113/77 mmHg  Pulse 94  Temp(Src) 96.5 F (35.8 C)  Ht   Wt   SpO2 97%   Subjective:    Patient ID: [name] [name], male    DOB: August 21, 1921, 82 y.o.   MRN: [id number]  HPI: [name] [name] is a 82 y.o. male  Chief Complaint  Patient presents with  . ultrasound results  . Lung Recheck   Has been coughing a little bit, has been having a runny nose. Has been having weak legs- which is usually for his symptom for pneumonia. Hasn't been moving as much as he should.   UPPER RESPIRATORY TRACT INFECTION Fever: no Cough: yes Shortness of breath: no Wheezing: yes Chest pain: no Chest tightness: no Chest congestion: no Nasal congestion: no Runny nose: yes Post nasal drip: yes Sneezing: yes Sore throat: no Swollen glands: no Sinus pressure: no Headache: no Face pain: no Toothache: no Ear pain: no  Ear pressure: no  Eyes red/itching:no Eye drainage/crusting: no  Vomiting: no Rash: no Fatigue: yes  He had a US done last week for a concern about DVT from PT. Negative [place] for DVT, but did show hypoechoic mass in the R popliteal fossa concerning for a solid mass. Daughter requests that we compare with US done by Dr. Oneida Alar 6 months ago prior to moving forward with further testing. He denies any discomfort and is otherwise doing well.   Relevant past medical, surgical, family and social history reviewed and updated as indicated. Interim medical history since our last visit reviewed. Allergies and medications reviewed and updated.  Review of Systems  Constitutional: Negative.   Respiratory: Negative.   Cardiovascular: Negative.   Musculoskeletal: Negative.   Psychiatric/Behavioral: Negative.     Per HPI unless specifically indicated above     Objective:    BP 113/77 mmHg  Pulse 94  Temp(Src) 96.5 F (35.8 C)  Ht   Wt   SpO2 97%  Wt Readings from Last 3 Encounters:  02/09/15 203 lb (92.08 kg)  12/07/14 205 lb (92.987 kg)  11/28/14 203 lb 9.6 oz (92.352 kg)     Physical Exam  Constitutional: He is oriented to person, place, and time. He appears well-developed and well-nourished. No distress.  HENT:  Head: Normocephalic and atraumatic.  Right Ear: Hearing normal.  Left Ear: Hearing normal.  Nose: Nose normal.  Eyes: Conjunctivae and lids are normal. Right eye exhibits no discharge. Left eye exhibits no discharge. No scleral icterus.  Cardiovascular: Normal rate, regular rhythm, normal heart sounds and intact distal pulses.  Exam reveals no gallop and no friction rub.   No murmur heard. Pulmonary/Chest: Effort normal. No respiratory distress. He has no wheezes. He has rhonchi in the right lower field. He has no rales. He exhibits no tenderness.  Musculoskeletal: Normal range of motion.  Neurological: He is alert and oriented to person, place, and time.  Skin: Skin is warm, dry and intact. No rash noted. No erythema. No pallor.  Psychiatric: He has a normal mood and affect. His speech is normal and behavior is normal. Judgment and thought content normal. Cognition and memory are normal.  Nursing note and vitals reviewed.   Results for orders placed or performed during the hospital encounter of 06/26/10  Urine culture  Result Value Ref Range   Specimen Description URINE, CATHETERIZED    Special Requests NONE    Culture  Setup Time [id number]    Colony Count NO GROWTH    Culture NO GROWTH  Report Status 06/27/2010 FINAL   Culture, blood (routine x 2)  Result Value Ref Range   Specimen Description BLOOD ARM LEFT    Special Requests BOTTLES DRAWN AEROBIC ONLY 8CC    Culture  Setup Time [id number]    Culture NO GROWTH 5 DAYS    Report Status 07/03/2010 FINAL   Culture, blood (routine x 2)  Result Value Ref Range   Specimen Description BLOOD ARM RIGHT    Special Requests BOTTLES DRAWN AEROBIC ONLY 8CC    Culture  Setup Time [id number]    Culture NO GROWTH 5 DAYS    Report Status 07/03/2010 FINAL   Differential  Result Value Ref  Range   Neutrophils Relative % 79 (H) 43 - 77 %   Lymphocytes Relative 12 12 - 46 %   Monocytes Relative 8 3 - 12 %   Eosinophils Relative 1 0 - 5 %   Basophils Relative 0 0 - 1 %   Neutro Abs 5.4 1.7 - 7.7 K/uL   Lymphs Abs 0.8 0.7 - 4.0 K/uL   Monocytes Absolute 0.5 0.1 - 1.0 K/uL   Eosinophils Absolute 0.1 0.0 - 0.7 K/uL   Basophils Absolute 0.0 0.0 - 0.1 K/uL   Smear Review FIBRIN STRANDS NOTED MORPHOLOGY UNREMARKABLE   CBC  Result Value Ref Range   WBC 6.8 4.0 - 10.5 K/uL   RBC 4.52 4.22 - 5.81 MIL/uL   Hemoglobin 14.5 13.0 - 17.0 g/dL   HCT 41.2 39.0 - 52.0 %   MCV 91.2 78.0 - 100.0 fL   MCH 32.1 26.0 - 34.0 pg   MCHC 35.2 30.0 - 36.0 g/dL   RDW 12.9 11.5 - 15.5 %   Platelets  150 - 400 K/uL    PLATELET CLUMPS NOTED ON SMEAR, COUNT APPEARS ADEQUATE  Protime-INR  Result Value Ref Range   Prothrombin Time 13.5 11.6 - 15.2 seconds   INR 1.01 0.00 - 1.49  APTT  Result Value Ref Range   aPTT 21 (L) 24 - 37 seconds  CK total and CKMB  Result Value Ref Range   Total CK 94 7 - 232 U/L   CK, MB 1.6 0.3 - 4.0 ng/mL   Relative Index  0.0 - 2.5    RELATIVE INDEX IS INVALID WHEN CK < 100 U/L         Comprehensive metabolic panel  Result Value Ref Range   Sodium 134 (L) 135 - 145 mEq/L   Potassium 5.2 (H) 3.5 - 5.1 mEq/L   Chloride 100 96 - 112 mEq/L   CO2 26 19 - 32 mEq/L   Glucose, Bld 119 (H) 70 - 99 mg/dL   BUN 16 6 - 23 mg/dL   Creatinine, Ser 0.78 0.4 - 1.5 mg/dL   Calcium 8.9 8.4 - 10.5 mg/dL   Total Protein 6.4 6.0 - 8.3 g/dL   Albumin 3.6 3.5 - 5.2 g/dL   AST 37 0 - 37 U/L   ALT 13 0 - 53 U/L   Alkaline Phosphatase 48 39 - 117 U/L   Total Bilirubin 1.2 0.3 - 1.2 mg/dL   GFR calc non Af Amer >60 >60 mL/min   GFR calc Af Amer  >60 mL/min    >60        The eGFR has been calculated using the MDRD equation. This calculation has not been validated in all clinical situations. eGFR's persistently <60 mL/min signify possible Chronic Kidney Disease.  Troponin  I  Result Value Ref Range  Troponin I 0.01        NO INDICATION OF MYOCARDIAL INJURY. 0.00 - 0.06 ng/mL  Lactic acid, plasma  Result Value Ref Range   Lactic Acid, Venous 2.0 0.5 - 2.2 mmol/L  Procalcitonin  Result Value Ref Range   Procalcitonin  ng/mL    <0.10        PCT (Procalcitonin) <= 0.5 ng/mL: Systemic infection (sepsis) is not likely. Local bacterial infection is possible.        PCT > 0.5 ng/mL and <= 2 ng/mL: Systemic infection (sepsis) is possible, but other conditions are known to elevate PCT as well.        PCT > 2 ng/mL: Systemic infection (sepsis) is likely, unless other causes are known.        PCT >= 10 ng/mL: Important systemic inflammatory response, almost exclusively due to severe bacterial sepsis or septic shock.  CK total and CKMB  Result Value Ref Range   Total CK 46 7 - 232 U/L   CK, MB 1.3 0.3 - 4.0 ng/mL   Relative Index  0.0 - 2.5    RELATIVE INDEX IS INVALID WHEN CK < 100 U/L         Troponin I  Result Value Ref Range   Troponin I 0.02        NO INDICATION OF MYOCARDIAL INJURY. 0.00 - 0.06 ng/mL  Urinalysis, Routine w reflex microscopic  Result Value Ref Range   Color, Urine YELLOW YELLOW   APPearance CLOUDY (A) CLEAR   Specific Gravity, Urine 1.023 1.005 - 1.030   pH 8.0 5.0 - 8.0   Glucose, UA NEGATIVE NEGATIVE mg/dL   Hgb urine dipstick NEGATIVE NEGATIVE   Bilirubin Urine NEGATIVE NEGATIVE   Ketones, ur 15 (A) NEGATIVE mg/dL   Protein, ur NEGATIVE NEGATIVE mg/dL   Urobilinogen, UA 1.0 0.0 - 1.0 mg/dL   Nitrite NEGATIVE NEGATIVE   Leukocytes, UA  NEGATIVE    NEGATIVE MICROSCOPIC NOT DONE ON URINES WITH NEGATIVE PROTEIN, BLOOD, LEUKOCYTES, NITRITE, OR GLUCOSE <1000 mg/dL.  Strep pneumoniae urinary antigen  Result Value Ref Range   Strep Pneumo Urinary Antigen  NEGATIVE    NEGATIVE        Infection due to S. pneumoniae cannot be absolutely ruled out since the antigen present may be below the detection limit of the test.   TSH  Result Value Ref Range   TSH 1.020 0.350 - 4.500 uIU/mL  Hemoglobin A1c  Result Value Ref Range   Hgb A1c MFr Bld (H) <5.7 %    6.0 (NOTE)                                                                       According to the ADA Clinical Practice Recommendations for 2011, when HbA1c is used as a screening test:   >=6.5%   Diagnostic of Diabetes Mellitus           (if abnormal result  is confirmed)  5.7-6.4%   Increased risk of developing Diabetes Mellitus  References:Diagnosis and Classification of Diabetes Mellitus,Diabetes CWCB,7628,31(DVVOH 1):S62-S69 and Standards of Medical Care in         Diabetes - 2011,Diabetes Care,2011,34  (Suppl 1):S11-S61.   Mean Plasma Glucose 126 (H) <117  mg/dL  Lipid panel  Result Value Ref Range   Cholesterol  0 - 200 mg/dL    108        ATP III CLASSIFICATION:  <200     mg/dL   Desirable  200-239  mg/dL   Borderline High  >=240    mg/dL   High          Triglycerides 61 <150 mg/dL   HDL 36 (L) >39 mg/dL   Total CHOL/HDL Ratio 3.0 RATIO   VLDL 12 0 - 40 mg/dL   LDL Cholesterol  0 - 99 mg/dL    60        Total Cholesterol/HDL:CHD Risk Coronary Heart Disease Risk Table                     Men   Women  1/2 Average Risk   3.4   3.3  Average Risk       5.0   4.4  2 X Average Risk   9.6   7.1  3 X Average Risk  23.4   11.0        Use the calculated Patient Ratio above and the CHD Risk Table to determine the patient's CHD Risk.        ATP III CLASSIFICATION (LDL):  <100     mg/dL   Optimal  100-129  mg/dL   Near or Above                    Optimal  130-159  mg/dL   Borderline  160-189  mg/dL   High  >190     mg/dL   Very High  Cardiac panel(cret kin+cktot+mb+tropi)  Result Value Ref Range   Total CK 98 7 - 232 U/L   CK, MB 2.1 0.3 - 4.0 ng/mL   Troponin I 0.02        NO INDICATION OF MYOCARDIAL INJURY. 0.00 - 0.06 ng/mL   Relative Index  0.0 - 2.5    RELATIVE INDEX IS INVALID WHEN CK < 100 U/L         Legionella antigen, urine   Result Value Ref Range   Specimen Description URINE, RANDOM    Special Requests NONE    Legionella Antigen, Urine Negative for Legionella pneumophilia serogroup 1    Report Status 06/27/2010 FINAL   Cardiac panel(cret kin+cktot+mb+tropi)  Result Value Ref Range   Total CK 160 7 - 232 U/L   CK, MB 2.6 0.3 - 4.0 ng/mL   Troponin I 0.02        NO INDICATION OF MYOCARDIAL INJURY. 0.00 - 0.06 ng/mL   Relative Index 1.6 0.0 - 2.5  Vitamin B12  Result Value Ref Range   Vitamin B-12 266 211 - 911 pg/mL  Folate  Result Value Ref Range   Folate  ng/mL    >20.0 (NOTE)  Reference Ranges        Deficient:       0.4 - 3.3 ng/mL        Indeterminate:   3.4 - 5.4 ng/mL        Normal:              > 5.4 ng/mL  RPR  Result Value Ref Range   RPR Ser Ql NON REACTIVE NON REACTIVE  CBC  Result Value Ref Range   WBC 6.0 4.0 - 10.5 K/uL   RBC 4.20 (L) 4.22 - 5.81 MIL/uL   Hemoglobin 13.0 13.0 -  17.0 g/dL   HCT 38.6 (L) 39.0 - 52.0 %   MCV 91.9 78.0 - 100.0 fL   MCH 31.0 26.0 - 34.0 pg   MCHC 33.7 30.0 - 36.0 g/dL   RDW 12.9 11.5 - 15.5 %   Platelets 113 (L) 150 - 400 K/uL  Basic metabolic panel  Result Value Ref Range   Sodium 140 135 - 145 mEq/L   Potassium 4.2 3.5 - 5.1 mEq/L   Chloride 102 96 - 112 mEq/L   CO2 28 19 - 32 mEq/L   Glucose, Bld 109 (H) 70 - 99 mg/dL   BUN 9 6 - 23 mg/dL   Creatinine, Ser 0.78 0.4 - 1.5 mg/dL   Calcium 9.2 8.4 - 10.5 mg/dL   GFR calc non Af Amer >60 >60 mL/min   GFR calc Af Amer  >60 mL/min    >60        The eGFR has been calculated using the MDRD equation. This calculation has not been validated in all clinical situations. eGFR's persistently <60 mL/min signify possible Chronic Kidney Disease.  ANA  Result Value Ref Range   Anit Nuclear Antibody(ANA) NEGATIVE NEGATIVE  Vitamin B1  Result Value Ref Range   Vitamin B1 (Thiamine) 38 9 - 44 nmol/L  I-STAT, chem 8  Result Value Ref Range   Sodium 134 (L) 135 - 145 mEq/L   Potassium 5.3 (H) 3.5 -  5.1 mEq/L   Chloride 103 96 - 112 mEq/L   BUN 22 6 - 23 mg/dL   Creatinine, Ser 0.9 0.4 - 1.5 mg/dL   Glucose, Bld 113 (H) 70 - 99 mg/dL   Calcium, Ion 0.91 (L) 1.12 - 1.32 mmol/L   TCO2 24 0 - 100 mmol/L   Hemoglobin 14.3 13.0 - 17.0 g/dL   HCT 42.0 39.0 - 52.0 %      Assessment & Plan:   Problem List Items Addressed This Visit      Respiratory   Pneumonia - Primary    Will treat with levaqiun and recheck in 2 weeks to see how he is doing.       Relevant Medications   levofloxacin (LEVAQUIN) 500 MG tablet    Other Visit Diagnoses    Lower leg mass, right        Will forward information to his vascular surgeon who has been checking on his ultrasounds every 6 months or so. If this has not been seen before, pursue MRI.     Physical deconditioning        OK to restart PT again. Note to PT sent today.        Follow up plan: Return in about 2 weeks (around 04/17/2015) for lung recheck.

## 2015-04-04 ENCOUNTER — Ambulatory Visit: Payer: Medicare Other | Admitting: Family Medicine

## 2015-04-06 ENCOUNTER — Other Ambulatory Visit: Payer: Self-pay | Admitting: *Deleted

## 2015-04-06 ENCOUNTER — Ambulatory Visit: Payer: Medicare Other

## 2015-04-06 DIAGNOSIS — Z01812 Encounter for preprocedural laboratory examination: Secondary | ICD-10-CM

## 2015-04-06 DIAGNOSIS — R2241 Localized swelling, mass and lump, right lower limb: Secondary | ICD-10-CM

## 2015-04-10 ENCOUNTER — Other Ambulatory Visit: Payer: Self-pay

## 2015-04-10 ENCOUNTER — Ambulatory Visit
Admission: RE | Admit: 2015-04-10 | Discharge: 2015-04-10 | Disposition: A | Discharge status: Home / Self Care | Payer: Medicare Other | Source: Ambulatory Visit | Attending: Vascular Surgery | Admitting: Vascular Surgery

## 2015-04-10 ENCOUNTER — Telehealth: Payer: Self-pay | Admitting: *Deleted

## 2015-04-10 ENCOUNTER — Other Ambulatory Visit: Payer: Self-pay | Admitting: *Deleted

## 2015-04-10 DIAGNOSIS — R2241 Localized swelling, mass and lump, right lower limb: Secondary | ICD-10-CM | POA: Diagnosis not present

## 2015-04-10 DIAGNOSIS — I724 Aneurysm of artery of lower extremity: Secondary | ICD-10-CM | POA: Insufficient documentation

## 2015-04-10 DIAGNOSIS — I714 Abdominal aortic aneurysm, without rupture: Secondary | ICD-10-CM | POA: Diagnosis not present

## 2015-04-10 DIAGNOSIS — M7989 Other specified soft tissue disorders: Secondary | ICD-10-CM | POA: Insufficient documentation

## 2015-04-10 DIAGNOSIS — I723 Aneurysm of iliac artery: Secondary | ICD-10-CM | POA: Diagnosis not present

## 2015-04-10 LAB — POCT I-STAT CREATININE: Creatinine, Ser: 1.1 mg/dL (ref 0.61–1.24)

## 2015-04-10 MED ORDER — IOHEXOL 350 MG/ML SOLN
100.0000 mL | Freq: Once | INTRAVENOUS | Status: AC | PRN
  Administered 2015-04-10: 100 mL via INTRAVENOUS

## 2015-04-10 NOTE — Telephone Encounter (Signed)
Talked to [place] at [facility] dept- I explained Dr. Darrick Penna verbal order for CTA of patient's lower extremity as follow up from the ultrasound ordered by Dr. Jean Rosenthal. She will scan from the pelvis to the toes with concentration on the popliteal area.

## 2015-04-11 ENCOUNTER — Ambulatory Visit: Payer: Medicare Other

## 2015-04-13 ENCOUNTER — Ambulatory Visit: Payer: Medicare Other

## 2015-04-14 ENCOUNTER — Telehealth: Payer: Self-pay | Admitting: Vascular Surgery

## 2015-04-14 NOTE — Telephone Encounter (Signed)
Spoke with pts daughter Luster Landsberg to schedule appt for 03/2, dpm

## 2015-04-14 NOTE — Telephone Encounter (Signed)
-----   Message from Phillips Odor, RN sent at 04/13/2015  4:11 PM EST ----- Regarding: schedule appt. with Dr. Deeann Dowse: [phone] Per Dr. Darrick Penna - schedule an office appt. in the next 2-3 weeks to revw/discuss pt's CTA with lower extremity runoff 2/6, to eval. right popliteal mass.  Please schedule appt. with Luster Landsberg, pt's daughter @ above number.

## 2015-04-17 ENCOUNTER — Ambulatory Visit: Payer: Medicare Other | Admitting: Family Medicine

## 2015-04-17 ENCOUNTER — Ambulatory Visit (INDEPENDENT_AMBULATORY_CARE_PROVIDER_SITE_OTHER): Payer: Medicare Other | Admitting: Family Medicine

## 2015-04-17 ENCOUNTER — Encounter: Payer: Self-pay | Admitting: Family Medicine

## 2015-04-17 ENCOUNTER — Telehealth: Payer: Self-pay

## 2015-04-17 VITALS — BP 113/71 | HR 88 | Temp 97.7°F

## 2015-04-17 DIAGNOSIS — J181 Lobar pneumonia, unspecified organism: Principal | ICD-10-CM

## 2015-04-17 DIAGNOSIS — J189 Pneumonia, unspecified organism: Secondary | ICD-10-CM

## 2015-04-17 NOTE — Assessment & Plan Note (Signed)
Resolved. Continue to monitor. Call with any concerns.  

## 2015-04-17 NOTE — Progress Notes (Signed)
BP 113/71 mmHg  Pulse 88  Temp(Src) 97.7 F (36.5 C)  Ht   Wt   SpO2 97%   Subjective:    Patient ID: [name] [name], male    DOB: 17-Aug-1921, 82 y.o.   MRN: [id number]  HPI: [name] [name] is a 82 y.o. male  Chief Complaint  Patient presents with  . lung recheck   Doing much better. Feeling more like himself. Has finished his medicine. No issues with fevers or chills. No other concerns or complaints at this time.   Relevant past medical, surgical, family and social history reviewed and updated as indicated. Interim medical history since our last visit reviewed. Allergies and medications reviewed and updated.  Review of Systems  Constitutional: Negative.   HENT: Positive for postnasal drip. Negative for congestion, dental problem, drooling, ear discharge, ear pain, facial swelling, hearing loss, mouth sores, nosebleeds, rhinorrhea, sinus pressure, sneezing, sore throat, tinnitus, trouble swallowing and voice change.   Respiratory: Negative.   Cardiovascular: Negative.   Psychiatric/Behavioral: Negative.     Per HPI unless specifically indicated above     Objective:    BP 113/71 mmHg  Pulse 88  Temp(Src) 97.7 F (36.5 C)  Ht   Wt   SpO2 97%  Wt Readings from Last 3 Encounters:  02/09/15 203 lb (92.08 kg)  12/07/14 205 lb (92.987 kg)  11/28/14 203 lb 9.6 oz (92.352 kg)    Physical Exam  Constitutional: He is oriented to person, place, and time. He appears well-developed and well-nourished. No distress.  HENT:  Head: Normocephalic and atraumatic.  Right Ear: Hearing and external ear normal.  Left Ear: Hearing and external ear normal.  Nose: Nose normal.  Mouth/Throat: Oropharynx is clear and moist. No oropharyngeal exudate.  Eyes: Conjunctivae and lids are normal. Right eye exhibits no discharge. Left eye exhibits no discharge. No scleral icterus.  Cardiovascular: Normal rate, regular rhythm and intact distal pulses.  Exam reveals friction rub. Exam reveals no  gallop.   Murmur heard. Pulmonary/Chest: Effort normal and breath sounds normal. No respiratory distress. He has no wheezes. He has no rales. He exhibits no tenderness.  Musculoskeletal: Normal range of motion.  Neurological: He is alert and oriented to person, place, and time.  Skin: Skin is warm, dry and intact. No rash noted. No erythema. No pallor.  Psychiatric: He has a normal mood and affect. His speech is normal and behavior is normal. Judgment and thought content normal. Cognition and memory are normal.    Results for orders placed or performed during the hospital encounter of 04/10/15  I-STAT creatinine  Result Value Ref Range   Creatinine, Ser 1.10 0.61 - 1.24 mg/dL      Assessment & Plan:   Problem List Items Addressed This Visit      Respiratory   Pneumonia - Primary    Resolved. Continue to monitor. Call with any concerns.           Follow up plan: Return for As scheduled.

## 2015-04-17 NOTE — Telephone Encounter (Signed)
Called Dr. Evelina Dun office [phone]), talked to [facility] from his office who stated that pt has bilateral popliteal aneurisms, no thrombosis. Has a 05/04/15 follow up to discuss imaging results. Focus more on upper extremities. Per conversation with Dr. Evelina Dun assistant/representative physical therapy for gait and LE strengthening held off until after his follow-up appointment with his MD.  [name] [name] pt daughter/caregiver [name] [name]) and communicated message. Pt was also recommended (through daughter) to continue performing seated ankle DF/PF, glute squeezes, and seated hip adduction pillow squeeze exercises to help with blood flow and maintain some LE use. Pt daughter verbalized understanding.

## 2015-04-18 ENCOUNTER — Ambulatory Visit: Payer: Medicare Other

## 2015-04-27 ENCOUNTER — Encounter: Payer: Self-pay | Admitting: Family

## 2015-05-04 ENCOUNTER — Ambulatory Visit (INDEPENDENT_AMBULATORY_CARE_PROVIDER_SITE_OTHER): Payer: Medicare Other | Admitting: Vascular Surgery

## 2015-05-04 ENCOUNTER — Encounter: Payer: Self-pay | Admitting: Vascular Surgery

## 2015-05-04 ENCOUNTER — Ambulatory Visit: Payer: Medicare Other

## 2015-05-04 VITALS — BP 114/70 | HR 76 | Temp 97.0°F | Resp 17 | Ht 71.0 in | Wt 212.0 lb

## 2015-05-04 DIAGNOSIS — I724 Aneurysm of artery of lower extremity: Secondary | ICD-10-CM

## 2015-05-04 DIAGNOSIS — I714 Abdominal aortic aneurysm, without rupture, unspecified: Secondary | ICD-10-CM

## 2015-05-04 NOTE — Addendum Note (Signed)
Addended by: Adria Dill L on: 05/04/2015 02:54 PM   Modules accepted: Orders

## 2015-05-04 NOTE — Progress Notes (Signed)
[facility] HISTORY AND PHYSICAL    History of Present Illness [name] [name] is a 82 y.o. male patient who status post right above-the-knee to below the knee popliteal graft placed October 2010. He returns today for follow up. Had fairly extensive imaging recently to evaluate a mass behind his right knee. This mass is his excluded right popliteal aneurysm. He also has bilateral Baker cysts. There is no evidence of tumor. He has not walked for a month. He was working with physical therapy but this was stopped due to the mass behind his right knee. He denies any pain in his right leg. He is in a wheelchair most of the time. He denies any abdominal or back pain.    Past Medical History   Diagnosis  Date   .  CAD (coronary artery disease)         nuclear 01/2008, no ischemia, EF 60%, distal anterior scar   .  Hyperlipidemia     .  Syncope         .remote..no etiology  /  syncope 11/2008.. from acute leg pain   .  Venous insufficiency         support hose   .  Carotid artery disease         doppler 05/2009,  0-39% bilateral / doppler 06/2010 OK   .  Elevated CPK         11/23/08.Marland KitchenMarland Kitchenprobably from debris from popliteal aneurysm   .  Popliteal aneurysm         .repair..11/25/2008  Dr. Darrick Penna   .  Speech problem         Neurologic event with brief infusion and speech difficulty.... January, 2011.... being assessed by neurology  February, 2011   .  Hx of CABG         1998   .  Ejection fraction         EF 45%, echo,05/2009,apical hypo,  ?? posterior and lateral hypo   .  Prominent abdominal aortic pulsation         doppler normal   .  Intracranial hemorrhage         Two small subependymal bleeds 06/2010,,ASA held   .  Bradycardia         06/2010   .  Edema         Ankle edema August, 2012   .  TIA (transient ischemic attack)         Brief episode of confusion and speech difficulty, January, 2012  /  recurrent confusion speech difficulty September, 2012   .  PAD  (peripheral artery disease)         Above the knee to below the knee popliteal bypass,, right  //    Doppler,  July, 2013, patent   .  Fall at home  July 2015     Social History Social History   Substance Use Topics   .  Smoking status:  Former Smoker -- 20 years       Types:  Cigarettes       Quit date:  09/12/1946   .  Smokeless tobacco:  Never Used         Comment: quit appox 50 yrs ago   .  Alcohol Use:  No     Family History Family History   Problem  Relation  Age of Onset   .  Diabetes  Sister     .  Stroke  Mother     .  Diabetes  Mother         Past Surgical History   Procedure  Laterality  Date   .  Coronary artery bypass graft    1998   .  Popliteal bypass  with reversed greater saphenous vein from right leg.           Allergies   Allergen  Reactions   .  Penicillins  Hives   .  Sulfonamide Derivatives  Rash        Current Outpatient Prescriptions on File Prior to Visit  Medication Sig Dispense Refill  . aspirin 81 MG tablet Take 81 mg by mouth daily.      . Calcium Carbonate (CALCIUM 500 PO) 2 tabs po qd     . docusate (COLACE) 50 MG/5ML liquid Take by mouth.    . Multiple Vitamin (MULTIVITAMIN) capsule Take 1 capsule by mouth daily.       No current facility-administered medications on file prior to visit.    ROS: See HPI for pertinent positives and negatives.   Physical Examination   Filed Vitals:   05/04/15 1348  BP: 114/70  Pulse: 76  Temp: 97 F (36.1 C)  TempSrc: Oral  Resp: 17  Height:  (1.803 m)  Weight: 212 lb (96.163 kg)  SpO2: 94%   Right lower extremity: No significant mass behind the right knee no pain on palpation both lower extremities edematous feet dusky bilaterally secondary to edema well perfused overall  Data: CT Angio abdomen pelvis with runoff was reviewed. Abdominal aortic aneurysm 3.9 cm. Excluded right popliteal aneurysm with patent bypass to the right lower extremity   PLAN:  Okay to resume physical  therapy as mass but on his right knee is popliteal aneurysm that has been previously treated. No contraindication to physical therapy.  The patient will follow-up in one year with a graft duplex scan of his right leg. He will also need an ultrasound of his abdominal aortic aneurysm in 1 year.  Fabienne Bruns, MD [facility] [place] Office: [phone] Pager: [phone]

## 2015-05-09 ENCOUNTER — Ambulatory Visit: Payer: Medicare Other | Attending: Family Medicine

## 2015-05-09 DIAGNOSIS — R531 Weakness: Secondary | ICD-10-CM | POA: Diagnosis present

## 2015-05-09 DIAGNOSIS — R262 Difficulty in walking, not elsewhere classified: Secondary | ICD-10-CM | POA: Diagnosis present

## 2015-05-09 NOTE — Therapy (Signed)
[facility] PH[facility] MEDICINE [address], [place], [address] Phone: [phone]   Fax:  [phone]  Physical Therapy Treatment And Progress Report  Patient Details  Name: [name] [name] MRN: [id number] Date of Birth: [birth year] Referring Provider: Olevia Perches, DO  Encounter Date: 05/09/2015      PT End of Session - 05/09/15 1657    Visit Number 28   Number of Visits 47   Date for PT Re-Evaluation 06/22/15   Authorization Type 1   Authorization Time Period of 10   PT Start Time 1656   PT Stop Time 1747   PT Time Calculation (min) 51 min   Equipment Utilized During Treatment Gait belt;Other (comment)  rw   Activity Tolerance Patient tolerated treatment well;No increased pain;Patient limited by fatigue   Behavior During Therapy [facility] for tasks assessed/performed      Past Medical History  Diagnosis Date  . CAD (coronary artery disease)     nuclear 01/2008, no ischemia, EF 60%, distal anterior scar  . Hyperlipidemia   . Syncope     .remote..no etiology  /  syncope 11/2008.. from acute leg pain  . Venous insufficiency     support hose  . Carotid artery disease (HCC)     doppler 05/2009,  0-39% bilateral / doppler 06/2010 OK  . Elevated CPK     11/23/08.Marland KitchenMarland Kitchenprobably from debris from popliteal aneurysm  . Popliteal aneurysm (HCC)     .repair..11/25/2008  Dr. Darrick Penna  . Speech problem     Neurologic event with brief infusion and speech difficulty.... January, 2011.... being assessed by neurology  February, 2011  . Hx of CABG     1998  . Ejection fraction     EF 45%, echo,05/2009,apical hypo,  ?? posterior and lateral hypo  . Prominent abdominal aortic pulsation     doppler normal  . Intracranial hemorrhage (HCC)     Two small subependymal bleeds 06/2010,,ASA held  . Bradycardia     06/2010  . Edema     Ankle edema August, 2012  . TIA (transient ischemic attack)     Brief episode of confusion and speech difficulty,  January, 2012  /  recurrent confusion speech difficulty September, 2012  . PAD (peripheral artery disease) (HCC)     Above the knee to below the knee popliteal bypass,, right  //    Doppler,  July, 2013, patent  . Fall at home July 2015    Past Surgical History  Procedure Laterality Date  . Coronary artery bypass graft  1998  . Popliteal bypass  with reversed greater saphenous vein from right leg.      There were no vitals filed for this visit.  Visit Diagnosis:  Weakness - Plan: PT plan of care cert/re-cert  Difficulty walking - Plan: PT plan of care cert/re-cert      Subjective Assessment - 05/09/15 1657    Subjective Pt states doing fine. No pain. Cleared for PT from both primary care MD and vascular MD   Patient is accompained by: Family member  Daughter and wife   Limitations Standing;Walking;House hold activities   How long can you walk comfortably? --   Patient Stated Goals "To be able to walk again."   Currently in Pain? No/denies       Objectives      [facility] PT Assessment - 05/09/15 1804    Observation/Other Assessments   Observations TUG: 1 min 55 seconds (pt unable to walk  all the way back however; min A to stand, mod A to sit). Five Times Sit to Stand: 1 min 10 seconds (CGA to min A to stand, mod A to sit)   Lower Extremity Functional Scale  14/80 (daughter [name] (was with him during initial evaluation) helped fill survey out)     Objectives     There-ex  SpO2 Room Air 97 %   Directed patient with sit <> stand from wc min A to CGA 2x  Then 5x (1 min 10 seconds), min to CGA to stand, mod A to sit Standing mini squats 2x, then 5x  Standing from a wc, walking 10 ft forward then about 10 feet back then sitting back onto wc 1x (95% SpO2)  SpO2 96 % room air Gait with rw 38 ft and 32 ft CGA +1 (96% - 97% SpO2) Seated bilateral hamstring stretch 2 min gastroc stretch x 1 min each LE   Manual therapy:   STM R vastus lateralis in sitting secondary  to anterior thigh discomfort with mini squats (decreased anterior thigh discomfort with mini squats afterwards)       Physical therapy was held off temporarily secondary to concern for possible vascular related issues bilateral LE. Pt currently cleared to continue physical therapy by his primary care provider and vascular doctor. Pt demonstrates decreased ability to perform the TUG test, decreased gait distance and decreased LEFS score (suggesting decreased function) compared to previous sessions  but similar ability to perform sit <> stand transfers with min to CGA to stand, and mod A to sit. Patient will benefit from continued skilled physical therapy services to improve ability to perform transfers, ability ambulate and improve function.           PT Education - 05/09/15 1806    Education provided Yes   Education Details ther-ex, plan of care   Person(s) Educated Patient;Child(ren)  daughter Luster Landsberg)   Methods Explanation;Demonstration;Tactile cues;Verbal cues   Comprehension Verbalized understanding;Returned demonstration             PT Long Term Goals - 05/09/15 1802    PT LONG TERM GOAL #1   Title Patient will demonstrate improved TUG score of  1 min  to decrease fall risk   Time 6   Period Weeks   Status On-going   PT LONG TERM GOAL #2   Title Patient will be able to ambulate with rw at least 130 ft with CGA +1 (second person pushing wheel chair) without rest to promote ability to ambulate around the house.    Time 6   Period Weeks   Status On-going   PT LONG TERM GOAL #3   Title Patient will demonstrate 5x STS in 1 min 45 seconds for improved strength and mobility at home.    Time 6   Period Weeks   Status Achieved   PT LONG TERM GOAL #4   Title Patient will improve LEFS score to > 25/80 demonstrating improved function.   Time 6   Period Weeks   Status On-going               Plan - 05/09/15 1809    Clinical Impression Statement Physical therapy was  held off temporarily secondary to concern for possible vascular related issues bilateral LE. Pt currently cleared to continue physical therapy by his primary care provider and vascular doctor. Pt demonstrates decreased ability to perform the TUG test, decreased gait distance and decreased LEFS score (suggesting decreased function) compared to  previous sessions  but similar ability to perform sit <> stand transfers with min to CGA to stand, and mod A to sit. Patient will benefit from continued skilled physical therapy services to improve ability to perform transfers, ability ambulate and improve function.    Pt will benefit from skilled therapeutic intervention in order to improve on the following deficits Decreased activity tolerance;Decreased balance;Impaired flexibility;Decreased mobility   Rehab Potential Fair   PT Frequency 2x / week   PT Duration 6 weeks   PT Treatment/Interventions ADLs/Self Care Home Management;Gait training;Therapeutic activities;Therapeutic exercise;Balance training;Wheelchair mobility training;Patient/family education;Passive range of motion   PT Next Visit Plan Strengthening, gait, stretching of LEs. balance   PT Home Exercise Plan --   Consulted and Agree with Plan of Care Patient;Family member/caregiver   Family Member Consulted one of pt daughters          G-Codes - 05/09/15 1810    Functional Assessment Tool Used  TUG, gait, clinical presentation   Functional Limitation Mobility: Walking and moving around   Mobility: Walking and Moving Around Current Status [phone]([id number]) At least 60 percent but less than 80 percent impaired, limited or restricted   Mobility: Walking and Moving Around Goal Status [phone]([id number]) At least 40 percent but less than 60 percent impaired, limited or restricted      Problem List Patient Active Problem List   Diagnosis Date Noted  . Pneumonia 02/09/2015  . 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 Discoloration of skin of foot-Left 10/13/2013   . PAD (peripheral artery disease) (HCC)   . TIA (transient ischemic attack)   . Edema   . Hyperlipidemia   . Syncope   . CAD (coronary artery disease)   . Venous insufficiency   . Carotid artery disease (HCC)   . Elevated CPK   . Popliteal aneurysm (HCC)   . Speech problem   . Hx of CABG   . Ejection fraction   . Prominent abdominal aortic pulsation   . Intracranial hemorrhage (HCC)   . Bradycardia     Thank you for your referral.   Loralyn FreshwaterMiguel Zaleigh Bermingham PT, DPT   05/09/2015, 6:29 PM  [facility] [address] [place], [place], [address] Phone: [phone]   Fax:  [phone]  Name: [name] [name] MRN: [id number] Date of Birth: 08/01/1921

## 2015-05-11 ENCOUNTER — Ambulatory Visit: Payer: Medicare Other

## 2015-05-11 DIAGNOSIS — R531 Weakness: Secondary | ICD-10-CM | POA: Diagnosis not present

## 2015-05-11 DIAGNOSIS — R262 Difficulty in walking, not elsewhere classified: Secondary | ICD-10-CM

## 2015-05-11 NOTE — Therapy (Signed)
[facility] [address] [place], [place], [address] Phone: [phone]   Fax:  [phone]  Physical Therapy Treatment  Patient Details  Name: [name] [name] MRN: [id number] Date of Birth: 02/13/1922 Referring Provider: Olevia PerchesMegan Johnson, DO  Encounter Date: 05/11/2015      PT End of Session - 05/11/15 1656    Visit Number 29   Number of Visits 47   Date for PT Re-Evaluation 06/22/15   Authorization Type 2   Authorization Time Period of 10   PT Start Time 1657   PT Stop Time 1732   PT Time Calculation (min) 35 min   Equipment Utilized During Treatment Gait belt;Other (comment)  rw   Activity Tolerance Patient tolerated treatment well;No increased pain;Patient limited by fatigue   Behavior [facility] Therapy [facility] for tasks assessed/performed      Past Medical History  Diagnosis Date  . CAD (coronary artery disease)     nuclear 01/2008, no ischemia, EF 60%, distal anterior scar  . Hyperlipidemia   . Syncope     .remote..no etiology  /  syncope 11/2008.. from acute leg pain  . Venous insufficiency     support hose  . Carotid artery disease (HCC)     doppler 05/2009,  0-39% bilateral / doppler 06/2010 OK  . Elevated CPK     11/23/08.Marland Kitchen.Marland Kitchen.probably from debris from popliteal aneurysm  . Popliteal aneurysm (HCC)     .repair..11/25/2008  Dr. Darrick PennaFields  . Speech problem     Neurologic event with brief infusion and speech difficulty.... January, 2011.... being assessed by neurology  February, 2011  . Hx of CABG     1998  . Ejection fraction     EF 45%, echo,05/2009,apical hypo,  ?? posterior and lateral hypo  . Prominent abdominal aortic pulsation     doppler normal  . Intracranial hemorrhage (HCC)     Two small subependymal bleeds 06/2010,,ASA held  . Bradycardia     06/2010  . Edema     Ankle edema August, 2012  . TIA (transient ischemic attack)     Brief episode of confusion and speech difficulty, January, 2012  /   recurrent confusion speech difficulty September, 2012  . PAD (peripheral artery disease) (HCC)     Above the knee to below the knee popliteal bypass,, right  //    Doppler,  July, 2013, patent  . Fall at home July 2015    Past Surgical History  Procedure Laterality Date  . Coronary artery bypass graft  1998  . Popliteal bypass  with reversed greater saphenous vein from right leg.      There were no vitals filed for this visit.  Visit Diagnosis:  Weakness  Difficulty walking      Subjective Assessment - 05/11/15 1708    Subjective Patient states doing good. Ate lunch. No pain. Pt daughter also adds that pt R arm bothers him from pulling.    Patient is accompained by: Family member  Daughter and wife   Limitations Standing;Walking;House hold activities   Patient Stated Goals "To be able to walk again."        Objectives   98%SpO2 room air  There-ex  Directed patient with seated manually resisted leg pres 10x 3 each LE  Sit <> stand from wc throughout session CGA to min to mod A  Standing mini squats 10x2 with bilateral UE assist from rw   Seated bilateral hamstring stretch with legs propped on  stool and Air Ex pad x 4 minutes    Reproduction of R arm symptoms with R radial nerve neural tension position for R UE.   R UE neural flossing by therapist (no neck movement) no change in symptoms      SpO2 96%   Attempted gait but pt unable to perform activity today. Gait held off  manual gastroc stretch 1 min each LE     Improved exercise technique, movement at target joints, use of target muscles after mod verbal, visual, tactile cues.     Manual therapy: Soft tissue mobilization R arm and forearm along radial nerve distribution. Pt states arm feeling better afterwards.    Pt able to perform mini squats today better compared to previous session. Attempted gait today but pt unable to perform. Held off until next visit. Reproduction of R arm symptoms with R  radial nerve neural tension position for R UE. Slight decrease in symptoms after soft tissue mobilization R arm along R radial nerve distribution.                    PT Education - 05/11/15 1709    Education provided Yes   Education Details ther-ex   [employer]) Educated Patient   Methods Explanation;Demonstration;Tactile cues;Verbal cues   Comprehension Verbalized understanding;Returned demonstration             PT Long Term Goals - 05/09/15 1802    PT LONG TERM GOAL #1   Title Patient will demonstrate improved TUG score of  1 min  to decrease fall risk   Time 6   Period Weeks   Status On-going   PT LONG TERM GOAL #2   Title Patient will be able to ambulate with rw at least 130 ft with CGA +1 (second person pushing wheel chair) without rest to promote ability to ambulate around the house.    Time 6   Period Weeks   Status On-going   PT LONG TERM GOAL #3   Title Patient will demonstrate 5x STS in 1 min 45 seconds for improved strength and mobility at home.    Time 6   Period Weeks   Status Achieved   PT LONG TERM GOAL #4   Title Patient will improve LEFS score to > 25/80 demonstrating improved function.   Time 6   Period Weeks   Status On-going               Plan - 05/11/15 1712    Clinical Impression Statement Pt able to perform mini squats today better compared to previous session. Attempted gait today but pt unable to perform. Held off until next visit. Reproduction of R arm symptoms with R radial nerve neural tension position for R UE. Slight decrease in symptoms after soft tissue mobilization R arm along R radial nerve distribution.    Pt will benefit from skilled therapeutic intervention in order to improve on the following deficits Decreased activity tolerance;Decreased balance;Impaired flexibility;Decreased mobility   Rehab Potential Fair   PT Frequency 2x / week   PT Duration 6 weeks   PT Treatment/Interventions ADLs/Self Care Home  Management;Gait training;Therapeutic activities;Therapeutic exercise;Balance training;Wheelchair mobility training;Patient/family education;Passive range of motion   PT Next Visit Plan Strengthening, gait, stretching of LEs. balance   Consulted and Agree with Plan of Care Patient;Family member/caregiver   Family Member Consulted one of pt daughters        Problem List Patient Active Problem List   Diagnosis Date Noted  .  Pneumonia 02/09/2015  . 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 Discoloration of skin of foot-Left 10/13/2013  . PAD (peripheral artery disease) (HCC)   . TIA (transient ischemic attack)   . Edema   . Hyperlipidemia   . Syncope   . CAD (coronary artery disease)   . Venous insufficiency   . Carotid artery disease (HCC)   . Elevated CPK   . Popliteal aneurysm (HCC)   . Speech problem   . Hx of CABG   . Ejection fraction   . Prominent abdominal aortic pulsation   . Intracranial hemorrhage (HCC)   . Bradycardia     Loralyn Freshwater PT, DPT   05/11/2015, 6:58 PM  [facility] PHYSICAL AND SPORTS MEDICINE [address] [place], [address] Phone: [phone]   Fax:  [phone]  Name: [name] [name] MRN: [id number] Date of Birth: 02-12-1922

## 2015-05-16 ENCOUNTER — Ambulatory Visit: Payer: Medicare Other

## 2015-05-18 ENCOUNTER — Ambulatory Visit: Payer: Medicare Other

## 2015-05-18 DIAGNOSIS — R531 Weakness: Secondary | ICD-10-CM

## 2015-05-18 DIAGNOSIS — R262 Difficulty in walking, not elsewhere classified: Secondary | ICD-10-CM

## 2015-05-18 NOTE — Therapy (Signed)
[facility] PH[facility]ORTS MEDICINE [address], [place], [address] Phone: [phone]   Fax:  [phone]  Physical Therapy Treatment  Patient Details  Name: [name] [name] MRN: [id number] Date of Birth: 11/24/1921 Referring Provider: Olevia Perches, DO  Encounter Date: 05/18/2015      PT End of Session - 05/18/15 1649    Visit Number 30   Number of Visits 47   Date for PT Re-Evaluation 06/22/15   Authorization Type 3   Authorization Time Period of 10   PT Start Time 1649   PT Stop Time 1732   PT Time Calculation (min) 43 min   Equipment Utilized During Treatment Gait belt;Other (comment)  rw   Activity Tolerance Patient tolerated treatment well;No increased pain;Patient limited by fatigue   Behavior During Therapy [facility] for tasks assessed/performed      Past Medical History  Diagnosis Date  . CAD (coronary artery disease)     nuclear 01/2008, no ischemia, EF 60%, distal anterior scar  . Hyperlipidemia   . Syncope     .remote..no etiology  /  syncope 11/2008.. from acute leg pain  . Venous insufficiency     support hose  . Carotid artery disease (HCC)     doppler 05/2009,  0-39% bilateral / doppler 06/2010 OK  . Elevated CPK     11/23/08.Marland KitchenMarland Kitchenprobably from debris from popliteal aneurysm  . Popliteal aneurysm (HCC)     .repair..11/25/2008  Dr. Darrick Penna  . Speech problem     Neurologic event with brief infusion and speech difficulty.... January, 2011.... being assessed by neurology  February, 2011  . Hx of CABG     1998  . Ejection fraction     EF 45%, echo,05/2009,apical hypo,  ?? posterior and lateral hypo  . Prominent abdominal aortic pulsation     doppler normal  . Intracranial hemorrhage (HCC)     Two small subependymal bleeds 06/2010,,ASA held  . Bradycardia     06/2010  . Edema     Ankle edema August, 2012  . TIA (transient ischemic attack)     Brief episode of confusion and speech difficulty, January, 2012  /   recurrent confusion speech difficulty September, 2012  . PAD (peripheral artery disease) (HCC)     Above the knee to below the knee popliteal bypass,, right  //    Doppler,  July, 2013, patent  . Fall at home July 2015    Past Surgical History  Procedure Laterality Date  . Coronary artery bypass graft  1998  . Popliteal bypass  with reversed greater saphenous vein from right leg.      There were no vitals filed for this visit.  Visit Diagnosis:  Weakness  Difficulty walking      Subjective Assessment - 05/18/15 1648    Subjective Pt states no pain. R knee is good. R arm is giving him a little trouble   Patient is accompained by: Family member  Daughter and wife   Limitations Standing;Walking;House hold activities   Patient Stated Goals "To be able to walk again."   Currently in Pain? Other (Comment)  R arm giving him a little trouble, no pain level provided        Objectives   There-ex   Directed patient with sit <> stand from wc, min to mod A  Seated hip extension/quad muscle isometrics pushing foot against soccer ball on floor (stabilized by PT) 10x5 seconds each LE for 2 sets  SpO2 96% room air  SPT from wc to elevated mat table (25 inches) min to mod A using rw  Sit <> stand from elevate mat table 2x with min A, then 1x with mod A, cues for bringing center of gravity over base of support, use of arms  SpO2 96%, 75 BPM    Directed patient with gait, min A to CGA +1 for 19 ft,  96% SpO2   then 21 ft (SpO2 89%-93% afterwards, rest break provided),  Seated bilateral hamstring stretch 2 min  Seated bilateral gastroc stretch 1 min each LE  Seated manually resisted leg press 10x each LE  Seated bilateral ankle DF/PF 10x each way.   SpO2 93-94% room air at end of session     Improved exercise technique, movement at target joints, use of target muscles after mod verbal, visual, tactile cues.     Pt demonstrates fatigue today. Able to ambulate 19 ft  and 21 ft today compared to not walking last session.                         PT Education - 05/18/15 1842    Education provided Yes   Education Details ther-ex   [employer] HotelsPerson(s) Educated Patient   Methods Explanation;Demonstration;Tactile cues;Verbal cues   Comprehension Verbalized understanding;Returned demonstration             PT Long Term Goals - 05/09/15 1802    PT LONG TERM GOAL #1   Title Patient will demonstrate improved TUG score of  1 min  to decrease fall risk   Time 6   Period Weeks   Status On-going   PT LONG TERM GOAL #2   Title Patient will be able to ambulate with rw at least 130 ft with CGA +1 (second person pushing wheel chair) without rest to promote ability to ambulate around the house.    Time 6   Period Weeks   Status On-going   PT LONG TERM GOAL #3   Title Patient will demonstrate 5x STS in 1 min 45 seconds for improved strength and mobility at home.    Time 6   Period Weeks   Status Achieved   PT LONG TERM GOAL #4   Title Patient will improve LEFS score to > 25/80 demonstrating improved function.   Time 6   Period Weeks   Status On-going               Plan - 05/18/15 1842    Clinical Impression Statement Pt demonstrates fatigue today. Able to ambulate 19 ft and 21 ft today compared to not walking last session.    Pt will benefit from skilled therapeutic intervention in order to improve on the following deficits Decreased activity tolerance;Decreased balance;Impaired flexibility;Decreased mobility   Rehab Potential Fair   PT Frequency 2x / week   PT Duration 6 weeks   PT Treatment/Interventions ADLs/Self Care Home Management;Gait training;Therapeutic activities;Therapeutic exercise;Balance training;Wheelchair mobility training;Patient/family education;Passive range of motion   PT Next Visit Plan Strengthening, gait, stretching of LEs. balance   Consulted and Agree with Plan of Care Patient;Family member/caregiver   Family  Member Consulted one of pt daughters        Problem List Patient Active Problem List   Diagnosis Date Noted  . Pneumonia 02/09/2015  . 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 Discoloration of skin of foot-Left 10/13/2013  . PAD (peripheral artery disease) (HCC)   . TIA (transient ischemic attack)   .  Edema   . Hyperlipidemia   . Syncope   . CAD (coronary artery disease)   . Venous insufficiency   . Carotid artery disease (HCC)   . Elevated CPK   . Popliteal aneurysm (HCC)   . Speech problem   . Hx of CABG   . Ejection fraction   . Prominent abdominal aortic pulsation   . Intracranial hemorrhage (HCC)   . Bradycardia     Loralyn Freshwater PT, DPT    05/18/2015, 6:53 PM  [facility] PHYSICAL AND SPORTS MEDICINE [address], [place], [address] Phone: [phone]   Fax:  [phone]  Name: KYEN TAITE MRN: [id number] Date of Birth: 1921/03/11

## 2015-05-23 ENCOUNTER — Ambulatory Visit: Payer: Medicare Other

## 2015-05-30 ENCOUNTER — Ambulatory Visit: Payer: Medicare Other

## 2015-05-30 DIAGNOSIS — R531 Weakness: Secondary | ICD-10-CM | POA: Diagnosis not present

## 2015-05-30 DIAGNOSIS — R262 Difficulty in walking, not elsewhere classified: Secondary | ICD-10-CM

## 2015-05-30 NOTE — Therapy (Signed)
[facility] AND [facility] MEDICINE [address] [place], [place], [address] Phone: [phone]   Fax:  [phone]  Physical Therapy Treatment  Patient Details  Name: [name] Palmerlbert L Tortorella MRN: [id number] Date of Birth: 07/30/1921 Referring Provider: Olevia PerchesMegan Johnson, DO  Encounter Date: 05/30/2015      PT End of Session - 05/30/15 1658    Visit Number 31   Number of Visits 47   Date for PT Re-Evaluation 06/22/15   Authorization Type 4   Authorization Time Period of 10   PT Start Time 1658  pt arrived late   PT Stop Time 1732   PT Time Calculation (min) 34 min   Equipment Utilized During Treatment Gait belt;Other (comment)  rw   Activity Tolerance Patient tolerated treatment well;No increased pain;Patient limited by fatigue   Behavior [facility] Therapy [facility] for tasks assessed/performed      Past Medical History  Diagnosis Date  . CAD (coronary artery disease)     nuclear 01/2008, no ischemia, EF 60%, distal anterior scar  . Hyperlipidemia   . Syncope     .remote..no etiology  /  syncope 11/2008.. from acute leg pain  . Venous insufficiency     support hose  . Carotid artery disease (HCC)     doppler 05/2009,  0-39% bilateral / doppler 06/2010 OK  . Elevated CPK     11/23/08.Marland Kitchen.Marland Kitchen.probably from debris from popliteal aneurysm  . Popliteal aneurysm (HCC)     .repair..11/25/2008  Dr. Darrick PennaFields  . Speech problem     Neurologic event with brief infusion and speech difficulty.... January, 2011.... being assessed by neurology  February, 2011  . Hx of CABG     1998  . Ejection fraction     EF 45%, echo,05/2009,apical hypo,  ?? posterior and lateral hypo  . Prominent abdominal aortic pulsation     doppler normal  . Intracranial hemorrhage (HCC)     Two small subependymal bleeds 06/2010,,ASA held  . Bradycardia     06/2010  . Edema     Ankle edema August, 2012  . TIA (transient ischemic attack)     Brief episode of confusion and speech difficulty,  January, 2012  /  recurrent confusion speech difficulty September, 2012  . PAD (peripheral artery disease) (HCC)     Above the knee to below the knee popliteal bypass,, right  //    Doppler,  July, 2013, patent  . Fall at home July 2015    Past Surgical History  Procedure Laterality Date  . Coronary artery bypass graft  1998  . Popliteal bypass  with reversed greater saphenous vein from right leg.      There were no vitals filed for this visit.  Visit Diagnosis:  Weakness  Difficulty walking      Subjective Assessment - 05/30/15 1847    Subjective Pt states doing ok. Wife is better (in rehab facility).   Patient is accompained by: Family member  Daughter and wife   Limitations Standing;Walking;House hold activities   Patient Stated Goals "To be able to walk again."   Currently in Pain? No/denies   Multiple Pain Sites No           Objectives:    There-ex  Directed patient with seated bilateral scapular retraction 10x3 (SpO2 improved from 93% to 98% room air)  Sit <> stand from wc throughout session with mod A, cues for hand placement, forward weight shifting   Seated manually resisted leg press 10x2  each LE  97% SpO2. Gait 44 ft with rw CGA +1 (96% SpO2), 26 ft (95 % SpO2) with rw CGA +1  Slight R medial thigh discomfort sitting on his wheelchair towards end of session (pt sitting predominantly on R hip, causing hip adduction position), disappeared when pt was cued to adjust sitting position so that weight is more evenly distributed between hips.     Improved exercise technique, movement at target joints, use of target muscles after mod to max verbal, visual, tactile cues.      Improved gait distance ambulated compared to previous sessions. Improved oxygenation when patient was cued for bilateral scapular retraction to promote more space for lungs to function better.                             PT Education - 05/30/15 1848    Education  provided Yes   Education Details ther-ex   [employer]) Educated Patient   Methods Explanation;Demonstration;Tactile cues;Verbal cues   Comprehension Verbalized understanding;Returned demonstration             PT Long Term Goals - 05/09/15 1802    PT LONG TERM GOAL #1   Title Patient will demonstrate improved TUG score of  1 min  to decrease fall risk   Time 6   Period Weeks   Status On-going   PT LONG TERM GOAL #2   Title Patient will be able to ambulate with rw at least 130 ft with CGA +1 (second person pushing wheel chair) without rest to promote ability to ambulate around the house.    Time 6   Period Weeks   Status On-going   PT LONG TERM GOAL #3   Title Patient will demonstrate 5x STS in 1 min 45 seconds for improved strength and mobility at home.    Time 6   Period Weeks   Status Achieved   PT LONG TERM GOAL #4   Title Patient will improve LEFS score to > 25/80 demonstrating improved function.   Time 6   Period Weeks   Status On-going               Plan - 05/30/15 1734    Clinical Impression Statement Improved gait distance ambulated compared to previous sessions. Improved oxygenation when patient was cued for bilateral scapular retraction to promote more space for lungs to function better.    Pt will benefit from skilled therapeutic intervention in order to improve on the following deficits Decreased activity tolerance;Decreased balance;Impaired flexibility;Decreased mobility   Rehab Potential Fair   PT Frequency 2x / week   PT Duration 6 weeks   PT Treatment/Interventions ADLs/Self Care Home Management;Gait training;Therapeutic activities;Therapeutic exercise;Balance training;Wheelchair mobility training;Patient/family education;Passive range of motion   PT Next Visit Plan Strengthening, gait, stretching of LEs. balance   Consulted and Agree with Plan of Care Patient;Family member/caregiver   Family Member Consulted one of pt daughters        Problem  List Patient Active Problem List   Diagnosis Date Noted  . Pneumonia 02/09/2015  . 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 Discoloration of skin of foot-Left 10/13/2013  . PAD (peripheral artery disease) (HCC)   . TIA (transient ischemic attack)   . Edema   . Hyperlipidemia   . Syncope   . CAD (coronary artery disease)   . Venous insufficiency   . Carotid artery disease (HCC)   . Elevated CPK   . Popliteal  aneurysm (HCC)   . Speech problem   . Hx of CABG   . Ejection fraction   . Prominent abdominal aortic pulsation   . Intracranial hemorrhage (HCC)   . Bradycardia     Loralyn Freshwater PT, DPT   05/30/2015, 6:58 PM  [facility] PHYSICAL AND SPORTS MEDICINE [address] [place], [address] Phone: [phone]   Fax:  [phone]  Name: INAKI VANTINE MRN: [id number] Date of Birth: [birth year]

## 2015-06-01 ENCOUNTER — Ambulatory Visit: Payer: Medicare Other

## 2015-06-01 DIAGNOSIS — R531 Weakness: Secondary | ICD-10-CM

## 2015-06-01 DIAGNOSIS — R262 Difficulty in walking, not elsewhere classified: Secondary | ICD-10-CM

## 2015-06-01 NOTE — Patient Instructions (Signed)
Pt was recommended to perform seated knee extension, hip flexion, ankle pumps, glute squeezes at home 3x10 each for each LE. Pt and daughter verbalized understanding.

## 2015-06-01 NOTE — Therapy (Signed)
[facility] PHYS[facility] AND SPORTS MEDICINE [address] [place], [address] Phone: [phone]   Fax:  [phone]  Physical Therapy Treatment  Patient Details  Name: [name] [name] MRN: [id number] Date of Birth: [birth year] Referring Provider: Olevia Perches, DO  Encounter Date: 06/01/2015      PT End of Session - 06/01/15 1651    Visit Number 32   Number of Visits 47   Date for PT Re-Evaluation 06/22/15   Authorization Type 5   Authorization Time Period of 10   PT Start Time 1651   PT Stop Time 1735   PT Time Calculation (min) 44 min   Equipment Utilized During Treatment Gait belt;Other (comment)  rw   Activity Tolerance Patient tolerated treatment well;No increased pain;Patient limited by fatigue   Behavior During Therapy [facility] for tasks assessed/performed      Past Medical History  Diagnosis Date  . CAD (coronary artery disease)     nuclear 01/2008, no ischemia, EF 60%, distal anterior scar  . Hyperlipidemia   . Syncope     .remote..no etiology  /  syncope 11/2008.. from acute leg pain  . Venous insufficiency     support hose  . Carotid artery disease (HCC)     doppler 05/2009,  0-39% bilateral / doppler 06/2010 OK  . Elevated CPK     11/23/08.Marland KitchenMarland Kitchenprobably from debris from popliteal aneurysm  . Popliteal aneurysm (HCC)     .repair..11/25/2008  Dr. Darrick Penna  . Speech problem     Neurologic event with brief infusion and speech difficulty.... January, 2011.... being assessed by neurology  February, 2011  . Hx of CABG     1998  . Ejection fraction     EF 45%, echo,05/2009,apical hypo,  ?? posterior and lateral hypo  . Prominent abdominal aortic pulsation     doppler normal  . Intracranial hemorrhage (HCC)     Two small subependymal bleeds 06/2010,,ASA held  . Bradycardia     06/2010  . Edema     Ankle edema August, 2012  . TIA (transient ischemic attack)     Brief episode of confusion and speech difficulty, January, 2012  /   recurrent confusion speech difficulty September, 2012  . PAD (peripheral artery disease) (HCC)     Above the knee to below the knee popliteal bypass,, right  //    Doppler,  July, 2013, patent  . Fall at home July 2015    Past Surgical History  Procedure Laterality Date  . Coronary artery bypass graft  1998  . Popliteal bypass  with reversed greater saphenous vein from right leg.      There were no vitals filed for this visit.  Visit Diagnosis:  Weakness  Difficulty walking      Subjective Assessment - 06/01/15 2044    Subjective Patient states doing good. No pain.    Patient is accompained by: Family member  Daughter and wife   Limitations Standing;Walking;House hold activities   Patient Stated Goals "To be able to walk again."   Currently in Pain? No/denies   Multiple Pain Sites No        Objectives:  There-ex  SpO2 94%  Seated manually resisted leg press 10x3 each LE Seated bilateral scapular retraction 10x3  Sit <> stand from wc mod A 3x (SpO2 improved from 93% to 96%), and throughout session. Cues for hand and foot placement, leaning forward to place center of gravity over base of support   Gait  with rw 22 ft CGA +1 (92% SpO2 afterwards)    Seated bilateral hamstring stretch 2 min  Seated bilateral gastroc stretch 1 min each LE  Seated knee extension resisting 2 lbs 10x each LE  Seated assisted hip flexion 10x each LE to end range  SpO2 average 93-94% today.   Improved exercise technique, movement at target joints, use of target muscles after mod verbal, visual, tactile cues.    Pt was recommended to perform seated knee extension, hip flexion, ankle pumps, glute squeezes at home 3x10 each for each LE. Pt and daughter verbalized understanding.   Pt demonstrates more fatigue with sit <> stand and walking today.                   PT Education - 06/01/15 2045    Education provided Yes   Education Details ther-ex   [employer]) Educated  Patient   Methods Explanation;Demonstration;Tactile cues;Verbal cues   Comprehension Verbalized understanding;Returned demonstration             PT Long Term Goals - 05/09/15 1802    PT LONG TERM GOAL #1   Title Patient will demonstrate improved TUG score of  1 min  to decrease fall risk   Time 6   Period Weeks   Status On-going   PT LONG TERM GOAL #2   Title Patient will be able to ambulate with rw at least 130 ft with CGA +1 (second person pushing wheel chair) without rest to promote ability to ambulate around the house.    Time 6   Period Weeks   Status On-going   PT LONG TERM GOAL #3   Title Patient will demonstrate 5x STS in 1 min 45 seconds for improved strength and mobility at home.    Time 6   Period Weeks   Status Achieved   PT LONG TERM GOAL #4   Title Patient will improve LEFS score to > 25/80 demonstrating improved function.   Time 6   Period Weeks   Status On-going               Plan - 06/01/15 2046    Clinical Impression Statement Pt demonstrates more fatigue with sit <> stand and walking today.    Pt will benefit from skilled therapeutic intervention in order to improve on the following deficits Decreased activity tolerance;Decreased balance;Impaired flexibility;Decreased mobility   Rehab Potential Fair   PT Frequency 2x / week   PT Duration 6 weeks   PT Treatment/Interventions ADLs/Self Care Home Management;Gait training;Therapeutic activities;Therapeutic exercise;Balance training;Wheelchair mobility training;Patient/family education;Passive range of motion   PT Next Visit Plan Strengthening, gait, stretching of LEs. balance   Consulted and Agree with Plan of Care Patient;Family member/caregiver   Family Member Consulted one of pt daughters        Problem List Patient Active Problem List   Diagnosis Date Noted  . Pneumonia 02/09/2015  . 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 Discoloration of skin of foot-Left 10/13/2013  . PAD  (peripheral artery disease) (HCC)   . TIA (transient ischemic attack)   . Edema   . Hyperlipidemia   . Syncope   . CAD (coronary artery disease)   . Venous insufficiency   . Carotid artery disease (HCC)   . Elevated CPK   . Popliteal aneurysm (HCC)   . Speech problem   . Hx of CABG   . Ejection fraction   . Prominent abdominal aortic pulsation   . Intracranial hemorrhage (HCC)   .  Bradycardia     Loralyn FreshwaterMiguel Winston Sobczyk PT, DPT   06/01/2015, 8:52 PM  [facility] [address] [place], [place], [address] Phone: [phone]   Fax:  [phone]  Name: [name] [name] MRN: [id number] Date of Birth: 03/21/1921

## 2015-06-06 ENCOUNTER — Ambulatory Visit: Payer: Medicare Other | Attending: Family Medicine

## 2015-06-06 DIAGNOSIS — R531 Weakness: Secondary | ICD-10-CM | POA: Insufficient documentation

## 2015-06-06 DIAGNOSIS — M6281 Muscle weakness (generalized): Secondary | ICD-10-CM | POA: Insufficient documentation

## 2015-06-06 DIAGNOSIS — R262 Difficulty in walking, not elsewhere classified: Secondary | ICD-10-CM | POA: Diagnosis present

## 2015-06-06 NOTE — Patient Instructions (Signed)
Reviewed HEP: sit <> stand from wc to rw with one of his daughters 3x in 10 min (to give body a rest) at least 2x daily. Pt and daughter verbalized understanding.

## 2015-06-06 NOTE — Therapy (Signed)
[facility] [address] [place], [place], [address] Phone: [phone]   Fax:  [phone]  Physical Therapy Treatment  Patient Details  Name: [name] [name] MRN: [id number] Date of Birth: 09/28/1921 Referring Provider: Olevia PerchesMegan Johnson, DO  Encounter Date: 06/06/2015      PT End of Session - 06/06/15 1653    Visit Number 33   Number of Visits 47   Date for PT Re-Evaluation 06/22/15   Authorization Type 6   Authorization Time Period of 10   PT Start Time 1653  pt arrived late   PT Stop Time 1733   PT Time Calculation (min) 40 min   Equipment Utilized During Treatment Gait belt;Other (comment)  rw   Activity Tolerance Patient tolerated treatment well;No increased pain;Patient limited by fatigue   Behavior [facility] Therapy [facility] for tasks assessed/performed      Past Medical History  Diagnosis Date  . CAD (coronary artery disease)     nuclear 01/2008, no ischemia, EF 60%, distal anterior scar  . Hyperlipidemia   . Syncope     .remote..no etiology  /  syncope 11/2008.. from acute leg pain  . Venous insufficiency     support hose  . Carotid artery disease (HCC)     doppler 05/2009,  0-39% bilateral / doppler 06/2010 OK  . Elevated CPK     11/23/08.Marland Kitchen.Marland Kitchen.probably from debris from popliteal aneurysm  . Popliteal aneurysm (HCC)     .repair..11/25/2008  Dr. Darrick PennaFields  . Speech problem     Neurologic event with brief infusion and speech difficulty.... January, 2011.... being assessed by neurology  February, 2011  . Hx of CABG     1998  . Ejection fraction     EF 45%, echo,05/2009,apical hypo,  ?? posterior and lateral hypo  . Prominent abdominal aortic pulsation     doppler normal  . Intracranial hemorrhage (HCC)     Two small subependymal bleeds 06/2010,,ASA held  . Bradycardia     06/2010  . Edema     Ankle edema August, 2012  . TIA (transient ischemic attack)     Brief episode of confusion and speech difficulty, January,  2012  /  recurrent confusion speech difficulty September, 2012  . PAD (peripheral artery disease) (HCC)     Above the knee to below the knee popliteal bypass,, right  //    Doppler,  July, 2013, patent  . Fall at home July 2015    Past Surgical History  Procedure Laterality Date  . Coronary artery bypass graft  1998  . Popliteal bypass  with reversed greater saphenous vein from right leg.      There were no vitals filed for this visit.  Visit Diagnosis:  Weakness  Difficulty walking      Subjective Assessment - 06/06/15 1709    Subjective Pt states not having any pain. Does not take much for his legs to get tired.    Patient is accompained by: Family member  Daughter and wife   Limitations Standing;Walking;House hold activities   Patient Stated Goals "To be able to walk again."   Currently in Pain? No/denies   Multiple Pain Sites No          Objectives  There-ex  SpO2 97%   Sit <> stand 3x mod A to min A  Gait with rw +1 3 ft, 4 ft, with CGA to min A,   NuStep level 1 x 5 min (seat 11) to  promote cardiovascular endurance, muslce pumping for LE, blood flow for LE, ankle DF ROM  98% SpO2 afterwards   wc to NuStep chair transfer x 1 min to mod A   Seated hip flexion with PT assist to end range 10x3 each LE  Reviewed HEP: sit <> stand from wc with one of his daughters 3x in 10 min (to give body a rest) at least 2x daily. Pt and daughter verbalized understanding.   Rest breaks secondary to fatigue.   Improved exercise technique, movement at target joints, use of target muscles after mod verbal, visual, tactile cues.       Pt demonstrates fatigue today, needing a little bit more assist with sit <> stand, and only able to walk about 3-4 ft today. Utilized NuStep machine to promote endurance.            PT Education - 06/06/15 1709    Education provided Yes   Education Details ther-ex   [employer]) Educated Patient   Methods  Explanation;Demonstration;Tactile cues;Verbal cues   Comprehension Returned demonstration;Verbalized understanding             PT Long Term Goals - 05/09/15 1802    PT LONG TERM GOAL #1   Title Patient will demonstrate improved TUG score of  1 min  to decrease fall risk   Time 6   Period Weeks   Status On-going   PT LONG TERM GOAL #2   Title Patient will be able to ambulate with rw at least 130 ft with CGA +1 (second person pushing wheel chair) without rest to promote ability to ambulate around the house.    Time 6   Period Weeks   Status On-going   PT LONG TERM GOAL #3   Title Patient will demonstrate 5x STS in 1 min 45 seconds for improved strength and mobility at home.    Time 6   Period Weeks   Status Achieved   PT LONG TERM GOAL #4   Title Patient will improve LEFS score to > 25/80 demonstrating improved function.   Time 6   Period Weeks   Status On-going               Plan - 06/06/15 1710    Clinical Impression Statement Pt demonstrates fatigue today, needing a little bit more assist with sit <> stand, and only able to walk about 3-4 ft today. Utilized NuStep machine to promote endurance.    Pt will benefit from skilled therapeutic intervention in order to improve on the following deficits Decreased activity tolerance;Decreased balance;Impaired flexibility;Decreased mobility   Rehab Potential Fair   PT Frequency 2x / week   PT Duration 6 weeks   PT Treatment/Interventions ADLs/Self Care Home Management;Gait training;Therapeutic activities;Therapeutic exercise;Balance training;Wheelchair mobility training;Patient/family education;Passive range of motion   PT Next Visit Plan Strengthening, gait, stretching of LEs. balance   Consulted and Agree with Plan of Care Patient;Family member/caregiver   Family Member Consulted one of pt daughters        Problem List Patient Active Problem List   Diagnosis Date Noted  . Pneumonia 02/09/2015  . Popliteal artery  aneurysm (HCC) 10/13/2013  . Discoloration of skin of foot-Left 10/13/2013  . PAD (peripheral artery disease) (HCC)   . TIA (transient ischemic attack)   . Edema   . Hyperlipidemia   . Syncope   . CAD (coronary artery disease)   . Venous insufficiency   . Carotid artery disease (HCC)   . Elevated CPK   .  Popliteal aneurysm (HCC)   . Speech problem   . Hx of CABG   . Ejection fraction   . Prominent abdominal aortic pulsation   . Intracranial hemorrhage (HCC)   . Bradycardia     Loralyn Freshwater PT, DPT   06/06/2015, 8:20 PM  [facility] PHYSICAL AND SPORTS MEDICINE [address] [place], [address] Phone: [phone]   Fax:  [phone]  Name: [name] [name] MRN: [id number] Date of Birth: 03/15/1921

## 2015-06-07 ENCOUNTER — Ambulatory Visit: Payer: Medicare Other | Admitting: Family Medicine

## 2015-06-08 ENCOUNTER — Ambulatory Visit: Payer: Medicare Other

## 2015-06-08 DIAGNOSIS — R531 Weakness: Secondary | ICD-10-CM

## 2015-06-08 DIAGNOSIS — R262 Difficulty in walking, not elsewhere classified: Secondary | ICD-10-CM

## 2015-06-08 NOTE — Therapy (Signed)
[place] [facility] [address], [place], [address] Phone: [phone]   Fax:  [phone]  Physical Therapy Treatment  Patient Details  Name: [name] [name] MRN: [id number] Date of Birth: 12-Jul-1921 Referring Provider: Olevia Perches, DO  Encounter Date: 06/08/2015      PT End of Session - 06/08/15 1649    Visit Number 34   Number of Visits 47   Date for PT Re-Evaluation 06/22/15   Authorization Type 7   Authorization Time Period of 10   PT Start Time 1649   PT Stop Time 1730   PT Time Calculation (min) 41 min   Equipment Utilized During Treatment Gait belt;Other (comment)  rw   Activity Tolerance Patient tolerated treatment well;No increased pain;Patient limited by fatigue   Behavior During Therapy [facility] for tasks assessed/performed      Past Medical History  Diagnosis Date  . CAD (coronary artery disease)     nuclear 01/2008, no ischemia, EF 60%, distal anterior scar  . Hyperlipidemia   . Syncope     .remote..no etiology  /  syncope 11/2008.. from acute leg pain  . Venous insufficiency     support hose  . Carotid artery disease (HCC)     doppler 05/2009,  0-39% bilateral / doppler 06/2010 OK  . Elevated CPK     11/23/08.Marland KitchenMarland Kitchenprobably from debris from popliteal aneurysm  . Popliteal aneurysm (HCC)     .repair..11/25/2008  Dr. Darrick Penna  . Speech problem     Neurologic event with brief infusion and speech difficulty.... January, 2011.... being assessed by neurology  February, 2011  . Hx of CABG     1998  . Ejection fraction     EF 45%, echo,05/2009,apical hypo,  ?? posterior and lateral hypo  . Prominent abdominal aortic pulsation     doppler normal  . Intracranial hemorrhage (HCC)     Two small subependymal bleeds 06/2010,,ASA held  . Bradycardia     06/2010  . Edema     Ankle edema August, 2012  . TIA (transient ischemic attack)     Brief episode of confusion and speech difficulty, January, 2012  /   recurrent confusion speech difficulty September, 2012  . PAD (peripheral artery disease) (HCC)     Above the knee to below the knee popliteal bypass,, right  //    Doppler,  July, 2013, patent  . Fall at home July 2015    Past Surgical History  Procedure Laterality Date  . Coronary artery bypass graft  1998  . Popliteal bypass  with reversed greater saphenous vein from right leg.      There were no vitals filed for this visit.  Visit Diagnosis:  Weakness  Difficulty walking      Subjective Assessment - 06/08/15 1655    Subjective Pt states doing good. No pain. Ate lunch which had protein.    Patient is accompained by: Family member  Daughter. Addendum to past few visits: wife not present due to health   Limitations Standing;Walking;House hold activities   Patient Stated Goals "To be able to walk again."   Currently in Pain? No/denies   Multiple Pain Sites No           Objectives  There-ex  Directed patient with seated bilateral scapular retraction 10x, then 1-2x with about 10 second holds  (SpO2 improved from 83% to 97% afterwards)   NuStep seat 12, arms 9, Lv 1 for 5 min for endurance,  ankle DF, muscle pump work  Gait 38 ft (97% afterwards), 23 ft (SpO2 97%), 42 ft (96% -97% SpO2) with rw CGA +1 mod to max verbal cues for staying closer to rw, increasing hip and knee flexion, step length  Seated hip flexion 10x each LE (to promote hip flexion during gait)  Rest breaks provided secondary to fatigue.    Improved exercise technique, movement at target joints, use of target muscles after mod verbal, visual, tactile cues.      Improved ability to ambulate longer distance today with gait compared to previous session. Continue utilizing NuStep to promote endurance, blood flow, ankle DF, muscle use. Pt also demonstrates improved oxygenation with upright posture with scapular retraction.                 PT Education - 06/08/15 1856    Education provided  Yes   Education Details ther-ex   Person(s) Educated Patient   Methods Explanation;Demonstration;Tactile cues;Verbal cues   Comprehension Verbalized understanding;Returned demonstration             PT Long Term Goals - 05/09/15 1802    PT LONG TERM GOAL #1   Title Patient will demonstrate improved TUG score of  1 min  to decrease fall risk   Time 6   Period Weeks   Status On-going   PT LONG TERM GOAL #2   Title Patient will be able to ambulate with rw at least 130 ft with CGA +1 (second person pushing wheel chair) without rest to promote ability to ambulate around the house.    Time 6   Period Weeks   Status On-going   PT LONG TERM GOAL #3   Title Patient will demonstrate 5x STS in 1 min 45 seconds for improved strength and mobility at home.    Time 6   Period Weeks   Status Achieved   PT LONG TERM GOAL #4   Title Patient will improve LEFS score to > 25/80 demonstrating improved function.   Time 6   Period Weeks   Status On-going               Plan - 06/08/15 1856    Clinical Impression Statement Improved ability to ambulate longer distance today with gait compared to previous session. Continue utilizing NuStep to promote endurance, blood flow, ankle DF, muscle use. Pt also demonstrates improved oxygenation with upright posture with scapular retraction.    Rehab Potential Fair   PT Frequency 2x / week   PT Duration 6 weeks   PT Treatment/Interventions ADLs/Self Care Home Management;Gait training;Therapeutic activities;Therapeutic exercise;Balance training;Wheelchair mobility training;Patient/family education;Passive range of motion   PT Next Visit Plan Strengthening, gait, stretching of LEs. balance   Consulted and Agree with Plan of Care Patient;Family member/caregiver   Family Member Consulted one of pt daughters        Problem List Patient Active Problem List   Diagnosis Date Noted  . Pneumonia 02/09/2015  . Popliteal artery aneurysm (HCC) 10/13/2013   . Discoloration of skin of foot-Left 10/13/2013  . PAD (peripheral artery disease) (HCC)   . TIA (transient ischemic attack)   . Edema   . Hyperlipidemia   . Syncope   . CAD (coronary artery disease)   . Venous insufficiency   . Carotid artery disease (HCC)   . Elevated CPK   . Popliteal aneurysm (HCC)   . Speech problem   . Hx of CABG   . Ejection fraction   . Prominent abdominal aortic  pulsation   . Intracranial hemorrhage (HCC)   . Bradycardia     Loralyn FreshwaterMiguel Anasia Agro PT, DPT   06/08/2015, 7:07 PM  [facility] [address] [place], [place], [address] Phone: [phone]   Fax:  [phone]  Name: [name] [name] MRN: [id number] Date of Birth: 08/29/1921

## 2015-06-13 ENCOUNTER — Ambulatory Visit: Payer: Medicare Other

## 2015-06-13 DIAGNOSIS — R531 Weakness: Secondary | ICD-10-CM | POA: Diagnosis not present

## 2015-06-13 DIAGNOSIS — R262 Difficulty in walking, not elsewhere classified: Secondary | ICD-10-CM

## 2015-06-13 DIAGNOSIS — M6281 Muscle weakness (generalized): Secondary | ICD-10-CM

## 2015-06-13 NOTE — Therapy (Signed)
[place] [facility] [address], [place], [address] Phone: [phone]   Fax:  [phone]  Physical Therapy Treatment  Patient Details  Name: [name] [name] MRN: [id number] Date of Birth: 11-26-1921 Referring Provider: Olevia Perches, DO  Encounter Date: 06/13/2015      PT End of Session - 06/13/15 1641    Visit Number 35   Number of Visits 59   Date for PT Re-Evaluation 07/27/15   Authorization Type 1   Authorization Time Period of 10   PT Start Time 1641   PT Stop Time 1728   PT Time Calculation (min) 47 min   Equipment Utilized During Treatment Gait belt;Other (comment)  rw   Activity Tolerance Patient tolerated treatment well;No increased pain;Patient limited by fatigue   Behavior During Therapy [facility] for tasks assessed/performed      Past Medical History  Diagnosis Date  . CAD (coronary artery disease)     nuclear 01/2008, no ischemia, EF 60%, distal anterior scar  . Hyperlipidemia   . Syncope     .remote..no etiology  /  syncope 11/2008.. from acute leg pain  . Venous insufficiency     support hose  . Carotid artery disease (HCC)     doppler 05/2009,  0-39% bilateral / doppler 06/2010 OK  . Elevated CPK     11/23/08.Marland KitchenMarland Kitchenprobably from debris from popliteal aneurysm  . Popliteal aneurysm (HCC)     .repair..11/25/2008  Dr. Darrick Penna  . Speech problem     Neurologic event with brief infusion and speech difficulty.... January, 2011.... being assessed by neurology  February, 2011  . Hx of CABG     1998  . Ejection fraction     EF 45%, echo,05/2009,apical hypo,  ?? posterior and lateral hypo  . Prominent abdominal aortic pulsation     doppler normal  . Intracranial hemorrhage (HCC)     Two small subependymal bleeds 06/2010,,ASA held  . Bradycardia     06/2010  . Edema     Ankle edema August, 2012  . TIA (transient ischemic attack)     Brief episode of confusion and speech difficulty, January, 2012  /   recurrent confusion speech difficulty September, 2012  . PAD (peripheral artery disease) (HCC)     Above the knee to below the knee popliteal bypass,, right  //    Doppler,  July, 2013, patent  . Fall at home July 2015    Past Surgical History  Procedure Laterality Date  . Coronary artery bypass graft  1998  . Popliteal bypass  with reversed greater saphenous vein from right leg.      There were no vitals filed for this visit.      Subjective Assessment - 06/13/15 1641    Subjective Patient states that he is doing fine. Ate lunch.   Patient is accompained by: Family member  Daughter.   Limitations Standing;Walking;House hold activities   Patient Stated Goals "To be able to walk again."   Currently in Pain? No/denies   Multiple Pain Sites No      Objectives  There-ex  SpO2 93% room air  Directed patient with stand pivot transfer from wc to NuStep chair x1 with  min A with use of rw  NuStep seat 10, arms 9, Lv 1 for 5 min for endurance, ankle DF, muscle pump work. 97% SpO2 afterwards  Seated bilateral scapular retraction 10x5 seconds (SpO2 improved)  Gait 22 ft CGA +1 (97% SpO2), 98%  SpO2 gait CGA +1 for 42 ft (SpO2 88% which returned to 97% after rest)  Seated bilateral hamstring stretch 3 min  Manual gastroc stretch 1 min each LE  Sit <> stand from wc 3x (1 min 51 seconds), SpO2 96%. Rest break provided after the 3rd repetition secondary to fatigue    Improved exercise technique, movement at target joints, use of target muscles after mod to max verbal, visual, tactile cues.      Pt has had difficulty with gait distance since starting back with physical therapy on 05/09/2015 with days in which pt was unable to ambulate, or only able to walk 3 ft and 4 ft. Pt now able to ambulate between 22 ft to 42 ft for the past two sessions consistently. Pt also demonstrates difficulty with performing sit to stand transfers, needing between min to mod assist and is able to perform  the task 3 times in 1 min and 51 seconds which is longer than the time it took for him to perform the task 5 times (1 min and 10 seconds) a few weeks ago. Pt will benefit from continued skilled physical therapy services to promote ability to perform transfers and to ambulate.          PT Education - 06/13/15 2102    Education provided Yes   Education Details ther-ex   [employer]) Educated Patient   Methods Explanation;Demonstration;Tactile cues;Verbal cues   Comprehension Verbalized understanding;Returned demonstration             PT Long Term Goals - 06/13/15 1713    PT LONG TERM GOAL #1   Title Patient will demonstrate improved TUG score of  1 min  to decrease fall risk   Time 6   Period Weeks   Status On-going   PT LONG TERM GOAL #2   Title Patient will be able to ambulate with rw at least 70 ft with CGA +1 (second person pushing wheel chair) without rest to promote ability to ambulate around the house.    Time 6   Period Weeks   Status Revised   PT LONG TERM GOAL #3   Title Patient will demonstrate 5x STS in 1 min 45 seconds for improved strength and mobility at home.    Time 6   Period Weeks   Status On-going   PT LONG TERM GOAL #4   Title Patient will improve LEFS score to > 25/80 demonstrating improved function.   Time 6   Period Weeks   Status On-going               Plan - 06/13/15 2102    Clinical Impression Statement Pt has had difficulty with gait distance since starting back with physical therapy on 05/09/2015 with days in which pt was unable to ambulate, or only able to walk 3 ft and 4 ft. Pt now able to ambulate between 22 ft to 42 ft for the past two sessions consistently. Pt also demonstrates difficulty with performing sit to stand transfers, needing between min to mod assist and is able to perform the task 3 times in 1 min and 51 seconds which is longer than the time it took for him to perform the task 5 times (1 min and 10 seconds) a few weeks ago.  Pt will benefit from continued skilled physical therapy services to promote ability to perform transfers and to ambulate.    Rehab Potential Fair   PT Frequency 2x / week   PT Duration 6 weeks  PT Treatment/Interventions ADLs/Self Care Home Management;Gait training;Therapeutic activities;Therapeutic exercise;Balance training;Wheelchair mobility training;Patient/family education;Passive range of motion   PT Next Visit Plan Strengthening, gait, stretching of LEs. balance   Consulted and Agree with Plan of Care Patient;Family member/caregiver   Family Member Consulted one of pt daughters      Patient will benefit from skilled therapeutic intervention in order to improve the following deficits and impairments:  Decreased activity tolerance, Decreased balance, Impaired flexibility, Decreased mobility  Visit Diagnosis: Muscle weakness (generalized) - Plan: PT plan of care cert/re-cert  Difficulty in walking, not elsewhere classified - Plan: PT plan of care cert/re-cert       G-Codes - 06/13/15 1713    Functional Assessment Tool Used clinical presentation, gait   Functional Limitation Mobility: Walking and moving around   Mobility: Walking and Moving Around Current Status ([id number]) At least 60 percent but less than 80 percent impaired, limited or restricted   Mobility: Walking and Moving Around Goal Status [phone]([id number]) At least 40 percent but less than 60 percent impaired, limited or restricted      Problem List Patient Active Problem List   Diagnosis Date Noted  . Pneumonia 02/09/2015  . 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 Discoloration of skin of foot-Left 10/13/2013  . PAD (peripheral artery disease) (HCC)   . TIA (transient ischemic attack)   . Edema   . Hyperlipidemia   . Syncope   . CAD (coronary artery disease)   . Venous insufficiency   . Carotid artery disease (HCC)   . Elevated CPK   . Popliteal aneurysm (HCC)   . Speech problem   . Hx of CABG   . Ejection  fraction   . Prominent abdominal aortic pulsation   . Intracranial hemorrhage (HCC)   . Bradycardia    Thank you for your referral.  Loralyn FreshwaterMiguel Baker Moronta PT, DPT   06/13/2015, 9:21 PM  [place] [facility] [address] [place], [place], [address] Phone: [phone]   Fax:  [phone]  Name: [name] [name] MRN: [id number] Date of Birth: 07/27/1921

## 2015-06-14 ENCOUNTER — Encounter: Payer: Self-pay | Admitting: Family Medicine

## 2015-06-14 ENCOUNTER — Ambulatory Visit (INDEPENDENT_AMBULATORY_CARE_PROVIDER_SITE_OTHER): Payer: Medicare Other | Admitting: Family Medicine

## 2015-06-14 VITALS — BP 108/68 | HR 76 | Temp 98.0°F

## 2015-06-14 DIAGNOSIS — I739 Peripheral vascular disease, unspecified: Secondary | ICD-10-CM | POA: Diagnosis not present

## 2015-06-14 DIAGNOSIS — Z0000 Encounter for general adult medical examination without abnormal findings: Secondary | ICD-10-CM | POA: Diagnosis not present

## 2015-06-14 DIAGNOSIS — R609 Edema, unspecified: Secondary | ICD-10-CM | POA: Diagnosis not present

## 2015-06-14 DIAGNOSIS — E785 Hyperlipidemia, unspecified: Secondary | ICD-10-CM | POA: Diagnosis not present

## 2015-06-14 DIAGNOSIS — I2510 Atherosclerotic heart disease of native coronary artery without angina pectoris: Secondary | ICD-10-CM

## 2015-06-14 LAB — URINALYSIS, ROUTINE W REFLEX MICROSCOPIC
BILIRUBIN UA: NEGATIVE
Glucose, UA: NEGATIVE
Ketones, UA: NEGATIVE
Leukocytes, UA: NEGATIVE
Nitrite, UA: NEGATIVE
PH UA: 7.5 (ref 5.0–7.5)
PROTEIN UA: NEGATIVE
RBC, UA: NEGATIVE
Specific Gravity, UA: 1.015 (ref 1.005–1.030)
Urobilinogen, Ur: 1 mg/dL (ref 0.2–1.0)

## 2015-06-14 NOTE — Assessment & Plan Note (Signed)
The current medical regimen is effective;  continue present plan and medications.  

## 2015-06-14 NOTE — Assessment & Plan Note (Signed)
Currently patient stable

## 2015-06-14 NOTE — Progress Notes (Signed)
BP 108/68 mmHg  Pulse 76  Temp(Src) 98 F (36.7 C)  Ht   Wt   SpO2 96%   Subjective:    Patient ID: [name] [name], male    DOB: 01-28-1922, 82 y.o.   MRN: [id number]  HPI: [name] [name] is a 82 y.o. male  Chief Complaint  Patient presents with  . Annual Exam  Patient accompanied by daughter who assists with history Patient wheelchair bound. Limited ability to walk and self transfer has assistance at home. Patient not taking any prescription medications. has some minor complaints of right arm arthritis pain Some gas issues Edema is stable Medicare Metrix met  Relevant past medical, surgical, family and social history reviewed and updated as indicated. Interim medical history since our last visit reviewed. Allergies and medications reviewed and updated.  For pts condition Review of Systems  Constitutional: Negative.   HENT: Negative.   Eyes: Negative.   Respiratory: Negative.   Cardiovascular: Negative.   Gastrointestinal: Negative.   Endocrine: Negative.   Genitourinary: Negative.   Musculoskeletal: Negative.   Skin: Negative.   Allergic/Immunologic: Negative.   Neurological: Negative.   Hematological: Negative.   Psychiatric/Behavioral: Negative.     Per HPI unless specifically indicated above     Objective:    BP 108/68 mmHg  Pulse 76  Temp(Src) 98 F (36.7 C)  Ht   Wt   SpO2 96%  Wt Readings from Last 3 Encounters:  05/04/15 212 lb (96.163 kg)  02/09/15 203 lb (92.08 kg)  12/07/14 205 lb (92.987 kg)    Physical Exam  Constitutional: He is oriented to person, place, and time. He appears well-developed and well-nourished.  HENT:  Head: Normocephalic.  Right Ear: External ear normal.  Left Ear: External ear normal.  Nose: Nose normal.  Eyes: Conjunctivae and EOM are normal. Pupils are equal, round, and reactive to light.  Neck: Normal range of motion. Neck supple. No thyromegaly present.  Cardiovascular: Normal rate, regular rhythm, normal  heart sounds and intact distal pulses.   Pulmonary/Chest: Effort normal and breath sounds normal.  Abdominal: Soft. Bowel sounds are normal. There is no splenomegaly or hepatomegaly.  Genitourinary: Penis normal.  Musculoskeletal: Normal range of motion.  Lymphadenopathy:    He has no cervical adenopathy.  Neurological: He is alert and oriented to person, place, and time. He has normal reflexes.  Skin: Skin is warm and dry.  Psychiatric: He has a normal mood and affect. His behavior is normal. Judgment and thought content normal.    Results for orders placed or performed in visit on 06/14/15  Urinalysis, Routine w reflex microscopic (not at [facility])  Result Value Ref Range   Specific Gravity, UA 1.015 1.005 - 1.030   pH, UA 7.5 5.0 - 7.5   Color, UA Yellow Yellow   Appearance Ur Clear Clear   Leukocytes, UA Negative Negative   Protein, UA Negative Negative/Trace   Glucose, UA Negative Negative   Ketones, UA Negative Negative   RBC, UA Negative Negative   Bilirubin, UA Negative Negative   Urobilinogen, Ur 1.0 0.2 - 1.0 mg/dL   Nitrite, UA Negative Negative      Assessment & Plan:   Problem List Items Addressed This Visit      Cardiovascular and Mediastinum   CAD (coronary artery disease)    The current medical regimen is effective;  continue present plan and medications.       Relevant Orders   Comprehensive metabolic panel   Lipid panel  CBC with Differential/Platelet   TSH   Urinalysis, Routine w reflex microscopic (not at [facility]) (Completed)   PAD (peripheral artery disease) ([place])    The current medical regimen is effective;  continue present plan and medications.       Relevant Orders   Comprehensive metabolic panel   Lipid panel   CBC with Differential/Platelet   TSH   Urinalysis, Routine w reflex microscopic (not at [facility]) (Completed)     Other   Hyperlipidemia - Primary    Currently patient stable      Relevant Orders   Comprehensive metabolic panel    Lipid panel   CBC with Differential/Platelet   TSH   Urinalysis, Routine w reflex microscopic (not at [facility]) (Completed)   Edema   Relevant Orders   Comprehensive metabolic panel   Lipid panel   CBC with Differential/Platelet   TSH   Urinalysis, Routine w reflex microscopic (not at [facility]) (Completed)    Other Visit Diagnoses    PE (physical exam), annual        Relevant Orders    Comprehensive metabolic panel    Lipid panel    CBC with Differential/Platelet    TSH    Urinalysis, Routine w reflex microscopic (not at [facility]) (Completed)        Follow up plan: Return in about 6 months (around 12/14/2015) for BMP.

## 2015-06-15 ENCOUNTER — Ambulatory Visit: Payer: Medicare Other

## 2015-06-15 ENCOUNTER — Encounter: Payer: Self-pay | Admitting: Family Medicine

## 2015-06-15 DIAGNOSIS — R531 Weakness: Secondary | ICD-10-CM | POA: Diagnosis not present

## 2015-06-15 DIAGNOSIS — R262 Difficulty in walking, not elsewhere classified: Secondary | ICD-10-CM

## 2015-06-15 DIAGNOSIS — M6281 Muscle weakness (generalized): Secondary | ICD-10-CM

## 2015-06-15 LAB — CBC WITH DIFFERENTIAL/PLATELET
BASOS: 1 %
Basophils Absolute: 0 10*3/uL (ref 0.0–0.2)
EOS (ABSOLUTE): 0.3 10*3/uL (ref 0.0–0.4)
Eos: 5 %
HEMATOCRIT: 39.4 % (ref 37.5–51.0)
Hemoglobin: 13 g/dL (ref 12.6–17.7)
IMMATURE GRANS (ABS): 0 10*3/uL (ref 0.0–0.1)
IMMATURE GRANULOCYTES: 0 %
LYMPHS: 25 %
Lymphocytes Absolute: 1.6 10*3/uL (ref 0.7–3.1)
MCH: 30.3 pg (ref 26.6–33.0)
MCHC: 33 g/dL (ref 31.5–35.7)
MCV: 92 fL (ref 79–97)
Monocytes Absolute: 0.4 10*3/uL (ref 0.1–0.9)
Monocytes: 7 %
NEUTROS PCT: 62 %
Neutrophils Absolute: 4 10*3/uL (ref 1.4–7.0)
Platelets: 185 10*3/uL (ref 150–379)
RBC: 4.29 x10E6/uL (ref 4.14–5.80)
RDW: 14.5 % (ref 12.3–15.4)
WBC: 6.5 10*3/uL (ref 3.4–10.8)

## 2015-06-15 LAB — LIPID PANEL
Chol/HDL Ratio: 3.4 ratio units (ref 0.0–5.0)
Cholesterol, Total: 134 mg/dL (ref 100–199)
HDL: 40 mg/dL (ref >39)
LDL Calculated: 71 mg/dL (ref 0–99)
TRIGLYCERIDES: 116 mg/dL (ref 0–149)
VLDL Cholesterol Cal: 23 mg/dL (ref 5–40)

## 2015-06-15 LAB — COMPREHENSIVE METABOLIC PANEL
ALBUMIN: 3.6 g/dL (ref 3.2–4.6)
ALT: 11 IU/L (ref 0–44)
AST: 22 IU/L (ref 0–40)
Albumin/Globulin Ratio: 1.3 (ref 1.2–2.2)
Alkaline Phosphatase: 63 IU/L (ref 39–117)
BILIRUBIN TOTAL: 0.3 mg/dL (ref 0.0–1.2)
BUN / CREAT RATIO: 23 (ref 10–24)
BUN: 15 mg/dL (ref 10–36)
CHLORIDE: 96 mmol/L (ref 96–106)
CO2: 30 mmol/L — ABNORMAL HIGH (ref 18–29)
Calcium: 9.2 mg/dL (ref 8.6–10.2)
Creatinine, Ser: 0.65 mg/dL — ABNORMAL LOW (ref 0.76–1.27)
GFR calc Af Amer: 98 mL/min/{1.73_m2} (ref >59)
GFR calc non Af Amer: 85 mL/min/{1.73_m2} (ref >59)
GLUCOSE: 98 mg/dL (ref 65–99)
Globulin, Total: 2.8 g/dL (ref 1.5–4.5)
Potassium: 4.7 mmol/L (ref 3.5–5.2)
SODIUM: 138 mmol/L (ref 134–144)
Total Protein: 6.4 g/dL (ref 6.0–8.5)

## 2015-06-15 LAB — TSH: TSH: 2.4 u[IU]/mL (ref 0.450–4.500)

## 2015-06-15 NOTE — Therapy (Signed)
[facility] [address], [place], [address] Phone: [phone]   Fax:  [phone]  Physical Therapy Treatment  Patient Details  Name: [name] [name] MRN: [id number] Date of Birth: 12-09-1921 Referring Provider: Olevia Perches, DO  Encounter Date: 06/15/2015      PT End of Session - 06/15/15 1642    Visit Number 36   Number of Visits 59   Date for PT Re-Evaluation 07/27/15   Authorization Type 2   Authorization Time Period of 10   PT Start Time 1642   PT Stop Time 1725   PT Time Calculation (min) 43 min   Equipment Utilized During Treatment Gait belt;Other (comment)  rw   Activity Tolerance Patient tolerated treatment well;No increased pain;Patient limited by fatigue   Behavior During Therapy [facility] for tasks assessed/performed      Past Medical History  Diagnosis Date  . CAD (coronary artery disease)     nuclear 01/2008, no ischemia, EF 60%, distal anterior scar  . Hyperlipidemia   . Syncope     .remote..no etiology  /  syncope 11/2008.. from acute leg pain  . Venous insufficiency     support hose  . Carotid artery disease (HCC)     doppler 05/2009,  0-39% bilateral / doppler 06/2010 OK  . Elevated CPK     11/23/08.Marland KitchenMarland Kitchenprobably from debris from popliteal aneurysm  . Popliteal aneurysm (HCC)     .repair..11/25/2008  Dr. Darrick Penna  . Speech problem     Neurologic event with brief infusion and speech difficulty.... January, 2011.... being assessed by neurology  February, 2011  . Hx of CABG     1998  . Ejection fraction     EF 45%, echo,05/2009,apical hypo,  ?? posterior and lateral hypo  . Prominent abdominal aortic pulsation     doppler normal  . Intracranial hemorrhage (HCC)     Two small subependymal bleeds 06/2010,,ASA held  . Bradycardia     06/2010  . Edema     Ankle edema August, 2012  . TIA (transient ischemic attack)     Brief episode of confusion and speech difficulty, January, 2012  /   recurrent confusion speech difficulty September, 2012  . PAD (peripheral artery disease) (HCC)     Above the knee to below the knee popliteal bypass,, right  //    Doppler,  July, 2013, patent  . Fall at home July 2015    Past Surgical History  Procedure Laterality Date  . Coronary artery bypass graft  1998  . Popliteal bypass  with reversed greater saphenous vein from right leg.      There were no vitals filed for this visit.      Subjective Assessment - 06/15/15 1645    Subjective Pt states doing well. No discomfort. Ate lunch today.    Patient is accompained by: Family member  Daughter.   Limitations Standing;Walking;House hold activities   Patient Stated Goals "To be able to walk again."   Currently in Pain? No/denies   Multiple Pain Sites No      Objectives:  There-ex 96% SpO2 Directed patient with sit <> stand from wc 4x CGA to SBA to min A  to stand, min to mod A to sit  And then throughout the session sitting bilateral hamstring stretch 2 minutes sitting bilateral scapular retraction 10x5 seconds for 3 sets (SpO2 improved to 97%)  Gait 30 ft with CGA +1 (SpO2 97% afterwards), 20 ft CGA +  1 (95% to 97% afterwards)  Seated manual gastroc stretch 1 min each LE  Seated manually resisted leg press 10x3 each LE  Seated hip flexion 10x each LE with PT assist to end range, then 10x2 each LE   Improved exercise technique, movement at target joints, use of target muscles after mod verbal, visual, tactile cues.      Better able to stand up from the sitting position from his wc with less assist today. SpO2 improves with upright posture and bilateral scapular retraction               PT Education - 06/15/15 1646    Education provided Yes   Education Details ther-ex   Person(s) Educated Patient   Methods Explanation;Demonstration;Tactile cues;Verbal cues   Comprehension Verbalized understanding;Returned demonstration             PT Long Term Goals -  06/13/15 1713    PT LONG TERM GOAL #1   Title Patient will demonstrate improved TUG score of  1 min  to decrease fall risk   Time 6   Period Weeks   Status On-going   PT LONG TERM GOAL #2   Title Patient will be able to ambulate with rw at least 70 ft with CGA +1 (second person pushing wheel chair) without rest to promote ability to ambulate around the house.    Time 6   Period Weeks   Status Revised   PT LONG TERM GOAL #3   Title Patient will demonstrate 5x STS in 1 min 45 seconds for improved strength and mobility at home.    Time 6   Period Weeks   Status On-going   PT LONG TERM GOAL #4   Title Patient will improve LEFS score to > 25/80 demonstrating improved function.   Time 6   Period Weeks   Status On-going               Plan - 06/15/15 1647    Clinical Impression Statement Better able to stand up from the sitting position from his wc with less assist today. SpO2 improves with upright posture and bilateral scapular retraction   Rehab Potential Fair   PT Frequency 2x / week   PT Duration 6 weeks   PT Treatment/Interventions ADLs/Self Care Home Management;Gait training;Therapeutic activities;Therapeutic exercise;Balance training;Wheelchair mobility training;Patient/family education;Passive range of motion   PT Next Visit Plan Strengthening, gait, stretching of LEs. balance   Consulted and Agree with Plan of Care Patient;Family member/caregiver   Family Member Consulted one of pt daughters      Patient will benefit from skilled therapeutic intervention in order to improve the following deficits and impairments:  Decreased activity tolerance, Decreased balance, Impaired flexibility, Decreased mobility  Visit Diagnosis: Muscle weakness (generalized)  Difficulty in walking, not elsewhere classified     Problem List Patient Active Problem List   Diagnosis Date Noted  . Pneumonia 02/09/2015  . 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 Discoloration of skin  of foot-Left 10/13/2013  . PAD (peripheral artery disease) (HCC)   . TIA (transient ischemic attack)   . Edema   . Hyperlipidemia   . Syncope   . CAD (coronary artery disease)   . Venous insufficiency   . Carotid artery disease (HCC)   . Elevated CPK   . Popliteal aneurysm (HCC)   . Speech problem   . Hx of CABG   . Ejection fraction   . Prominent abdominal aortic pulsation   . Intracranial hemorrhage (  HCC)   . Bradycardia     Loralyn FreshwaterMiguel Ralynn San PT, DPT   06/15/2015, 5:27 PM  [place] [facility] [address] [place], [place], [address] Phone: [phone]   Fax:  [phone]  Name: [name] [name] MRN: [id number] Date of Birth: 06/04/1921

## 2015-06-20 ENCOUNTER — Ambulatory Visit: Payer: Medicare Other

## 2015-06-20 ENCOUNTER — Encounter: Payer: Self-pay | Admitting: Physical Therapy

## 2015-06-20 VITALS — BP 119/62 | HR 79

## 2015-06-20 DIAGNOSIS — R531 Weakness: Secondary | ICD-10-CM | POA: Diagnosis not present

## 2015-06-20 DIAGNOSIS — M6281 Muscle weakness (generalized): Secondary | ICD-10-CM

## 2015-06-20 DIAGNOSIS — R262 Difficulty in walking, not elsewhere classified: Secondary | ICD-10-CM

## 2015-06-20 NOTE — Therapy (Signed)
[place] [facility] [address], [place], [address] Phone: [phone]   Fax:  [phone]  Physical Therapy Treatment  Patient Details  Name: [name] [name] MRN: [id number] Date of Birth: 08/11/1921 Referring Provider: Olevia Perches, DO  Encounter Date: 06/20/2015      PT End of Session - 06/20/15 1508    Visit Number 37   Number of Visits 59   Date for PT Re-Evaluation 07/27/15   Authorization Type 3   Authorization Time Period of 10   PT Start Time 1430   PT Stop Time 1500   PT Time Calculation (min) 30 min   Equipment Utilized During Treatment Gait belt;Other (comment)  rw   Activity Tolerance Patient tolerated treatment well;No increased pain;Patient limited by fatigue   Behavior During Therapy [facility] for tasks assessed/performed      Past Medical History  Diagnosis Date  . CAD (coronary artery disease)     nuclear 01/2008, no ischemia, EF 60%, distal anterior scar  . Hyperlipidemia   . Syncope     .remote..no etiology  /  syncope 11/2008.. from acute leg pain  . Venous insufficiency     support hose  . Carotid artery disease (HCC)     doppler 05/2009,  0-39% bilateral / doppler 06/2010 OK  . Elevated CPK     11/23/08.Marland KitchenMarland Kitchenprobably from debris from popliteal aneurysm  . Popliteal aneurysm (HCC)     .repair..11/25/2008  Dr. Darrick Penna  . Speech problem     Neurologic event with brief infusion and speech difficulty.... January, 2011.... being assessed by neurology  February, 2011  . Hx of CABG     1998  . Ejection fraction     EF 45%, echo,05/2009,apical hypo,  ?? posterior and lateral hypo  . Prominent abdominal aortic pulsation     doppler normal  . Intracranial hemorrhage (HCC)     Two small subependymal bleeds 06/2010,,ASA held  . Bradycardia     06/2010  . Edema     Ankle edema August, 2012  . TIA (transient ischemic attack)     Brief episode of confusion and speech difficulty, January, 2012  /   recurrent confusion speech difficulty September, 2012  . PAD (peripheral artery disease) (HCC)     Above the knee to below the knee popliteal bypass,, right  //    Doppler,  July, 2013, patent  . Fall at home July 2015    Past Surgical History  Procedure Laterality Date  . Coronary artery bypass graft  1998  . Popliteal bypass  with reversed greater saphenous vein from right leg.      Filed Vitals:   06/20/15 1429  BP: 119/62  Pulse: 79  SpO2: 98%        Subjective Assessment - 06/20/15 1429    Subjective Pt reports he is doing well on this date. He is performing HEP without issue. Denies pain or SOB currently. No specific questions or concerns.    Patient is accompained by: Family member  Daughter.   Limitations Standing;Walking;House hold activities   Patient Stated Goals "To be able to walk again."   Currently in Pain? No/denies       Treatment  There-ex Vitals obtained and WNL; Directed patient with sit <> stand from wc x 4 CGA to minA+1, heavy cues for forward trunk lean to maintain COM over BOS; Sitting bilateral hamstring stretch x 30 seconds; Sitting bilateral gastroc stretch 30 seconds x 2; Seated  manually resisted leg press 2 x 10 each LE; Seated manually resisted LAQ 2 x 10 bilateral; Seated manually resisted clams and adductor squeezes 2 x 10 each; Seated hip flexion 2 x 10 each; Seated manually resisted golf club rows and chest press 2 x 10 each; Gait 30 ft with CGA +1 and wheelchair follow (SpO2 97% afterwards). Vitals intermittently monitored throughout session and remain WNL. Pt requires verbal and tactile cues for correct exercise technique.                            PT Education - 06/20/15 1508    Education provided Yes   Education Details HEP reinforced   Person(s) Educated Patient;Child(ren)   Methods Explanation   Comprehension Verbalized understanding             PT Long Term Goals - 06/13/15 1713    PT LONG  TERM GOAL #1   Title Patient will demonstrate improved TUG score of  1 min  to decrease fall risk   Time 6   Period Weeks   Status On-going   PT LONG TERM GOAL #2   Title Patient will be able to ambulate with rw at least 70 ft with CGA +1 (second person pushing wheel chair) without rest to promote ability to ambulate around the house.    Time 6   Period Weeks   Status Revised   PT LONG TERM GOAL #3   Title Patient will demonstrate 5x STS in 1 min 45 seconds for improved strength and mobility at home.    Time 6   Period Weeks   Status On-going   PT LONG TERM GOAL #4   Title Patient will improve LEFS score to > 25/80 demonstrating improved function.   Time 6   Period Weeks   Status On-going               Plan - 06/20/15 1509    Clinical Impression Statement Pt arrived 13 minutes late so session shortened accordingly due to scheduling conflicts. Pt demonstrates minimal fatigue during seated ther-ex today. He does report fatigue with very limited ambulation however vitals remain WNL throughout distance. Pt encouraged to continue HEP and follow-up as scheduled.    Rehab Potential Fair   PT Frequency 2x / week   PT Duration 6 weeks   PT Treatment/Interventions ADLs/Self Care Home Management;Gait training;Therapeutic activities;Therapeutic exercise;Balance training;Wheelchair mobility training;Patient/family education;Passive range of motion   PT Next Visit Plan Strengthening, gait, stretching of LEs. balance   PT Home Exercise Plan Continue as prescribed   Consulted and Agree with Plan of Care Patient;Family member/caregiver   Family Member Consulted one of pt daughters      Patient will benefit from skilled therapeutic intervention in order to improve the following deficits and impairments:  Decreased activity tolerance, Decreased balance, Impaired flexibility, Decreased mobility  Visit Diagnosis: Muscle weakness (generalized)  Difficulty in walking, not elsewhere  classified     Problem List Patient Active Problem List   Diagnosis Date Noted  . Pneumonia 02/09/2015  . 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 Discoloration of skin of foot-Left 10/13/2013  . PAD (peripheral artery disease) (HCC)   . TIA (transient ischemic attack)   . Edema   . Hyperlipidemia   . Syncope   . CAD (coronary artery disease)   . Venous insufficiency   . Carotid artery disease (HCC)   . Elevated CPK   . Popliteal aneurysm (  HCC)   . Speech problem   . Hx of CABG   . Ejection fraction   . Prominent abdominal aortic pulsation   . Intracranial hemorrhage (HCC)   . Bradycardia    Lynnea MaizesJason D Bron Snellings PT, DPT   Josuel Koeppen 06/20/2015, 3:11 PM  [facility] [address] [place], [place], [address] Phone: [phone]   Fax:  [phone]  Name: [name] [name] MRN: [id number] Date of Birth: 05/05/1921

## 2015-06-27 ENCOUNTER — Ambulatory Visit: Payer: Medicare Other

## 2015-06-27 DIAGNOSIS — R531 Weakness: Secondary | ICD-10-CM | POA: Diagnosis not present

## 2015-06-27 DIAGNOSIS — R262 Difficulty in walking, not elsewhere classified: Secondary | ICD-10-CM

## 2015-06-27 DIAGNOSIS — M6281 Muscle weakness (generalized): Secondary | ICD-10-CM

## 2015-06-27 NOTE — Therapy (Signed)
[facility] [address], [place], [address] Phone: [phone]   Fax:  [phone]  Physical Therapy Treatment  Patient Details  Name: [name] [name] MRN: [id number] Date of Birth: 1921/09/28 Referring Provider: Olevia Perches, DO  Encounter Date: 06/27/2015      PT End of Session - 06/27/15 1659    Visit Number 38   Number of Visits 59   Date for PT Re-Evaluation 07/27/15   Authorization Type 4   Authorization Time Period of 10   PT Start Time 1659   PT Stop Time 1746   PT Time Calculation (min) 47 min   Equipment Utilized During Treatment Gait belt;Other (comment)  rw   Activity Tolerance Patient tolerated treatment well;No increased pain;Patient limited by fatigue   Behavior During Therapy [facility] for tasks assessed/performed      Past Medical History  Diagnosis Date  . CAD (coronary artery disease)     nuclear 01/2008, no ischemia, EF 60%, distal anterior scar  . Hyperlipidemia   . Syncope     .remote..no etiology  /  syncope 11/2008.. from acute leg pain  . Venous insufficiency     support hose  . Carotid artery disease (HCC)     doppler 05/2009,  0-39% bilateral / doppler 06/2010 OK  . Elevated CPK     11/23/08.Marland KitchenMarland Kitchenprobably from debris from popliteal aneurysm  . Popliteal aneurysm (HCC)     .repair..11/25/2008  Dr. Darrick Penna  . Speech problem     Neurologic event with brief infusion and speech difficulty.... January, 2011.... being assessed by neurology  February, 2011  . Hx of CABG     1998  . Ejection fraction     EF 45%, echo,05/2009,apical hypo,  ?? posterior and lateral hypo  . Prominent abdominal aortic pulsation     doppler normal  . Intracranial hemorrhage (HCC)     Two small subependymal bleeds 06/2010,,ASA held  . Bradycardia     06/2010  . Edema     Ankle edema August, 2012  . TIA (transient ischemic attack)     Brief episode of confusion and speech difficulty, January, 2012  /   recurrent confusion speech difficulty September, 2012  . PAD (peripheral artery disease) (HCC)     Above the knee to below the knee popliteal bypass,, right  //    Doppler,  July, 2013, patent  . Fall at home July 2015    Past Surgical History  Procedure Laterality Date  . Coronary artery bypass graft  1998  . Popliteal bypass  with reversed greater saphenous vein from right leg.      Filed Vitals:   06/27/15 1704  SpO2: 93%        Subjective Assessment - 06/27/15 1703    Subjective Pt states doing good. No pain. Ate lunch which also had protein.    Patient is accompained by: Family member  Daughter.   Limitations Standing;Walking;House hold activities   Patient Stated Goals "To be able to walk again."   Currently in Pain? No/denies   Multiple Pain Sites No    Pt also adds that when he takes a break from walking, its due to both fatigue and R medial knee discomfort.        Objectives  There-ex   Directed patient with sit <> stand from wc min to mod A throughout session  Stand pivot transfer from wc <> NuStep chair mod A, cues for proper rw placement  and technique  NuStep Lv 1, seat 9, arms 7 for 5 min vitals monitored (SpO2 improved to 98% afterwards)  Gait with rw 13 ft (97% SpO2), and 15 ft with CGA +1  Manual gastroc stretch 1 min each LE  Seated bilateral hamstring stretch x 2 min  Seated manually resisted clam shells (hips less than 90 degrees flexion to promote glute med use) 10x3  seated bilateral gastroc stretch 5x5 seconds each LE using strap  Reviewed and given as part of his HEP. Pt demonstrated and verbalized understanding. Daughter verbalized understanding.    Improved exercise technique, movement at target joints, use of target muscles after mod to max  verbal, visual, tactile cues.     Pt demonstrates decreased R femoral control during gait (R hip adduction and IR). Worked on seated manually resisted clam shells to promote glute med strength  to promote thigh control during gait and hopefully decrease R medial knee symptoms when performing activity.         PT Education - 06/27/15 1711    Education provided Yes   Education Details ther-ex   [employer]) Educated Patient   Methods Explanation;Demonstration;Tactile cues;Verbal cues   Comprehension Verbalized understanding;Returned demonstration             PT Long Term Goals - 06/13/15 1713    PT LONG TERM GOAL #1   Title Patient will demonstrate improved TUG score of  1 min  to decrease fall risk   Time 6   Period Weeks   Status On-going   PT LONG TERM GOAL #2   Title Patient will be able to ambulate with rw at least 70 ft with CGA +1 (second person pushing wheel chair) without rest to promote ability to ambulate around the house.    Time 6   Period Weeks   Status Revised   PT LONG TERM GOAL #3   Title Patient will demonstrate 5x STS in 1 min 45 seconds for improved strength and mobility at home.    Time 6   Period Weeks   Status On-going   PT LONG TERM GOAL #4   Title Patient will improve LEFS score to > 25/80 demonstrating improved function.   Time 6   Period Weeks   Status On-going               Plan - 06/27/15 1711    Clinical Impression Statement Pt demonstrates decreased R femoral control during gait (R hip adduction and IR). Worked on seated manually resisted clam shells to promote glute med strength to promote thigh control during gait and hopefully decrease R medial knee symptoms when performing activity.    Rehab Potential Fair   PT Frequency 2x / week   PT Duration 6 weeks   PT Treatment/Interventions ADLs/Self Care Home Management;Gait training;Therapeutic activities;Therapeutic exercise;Balance training;Wheelchair mobility training;Patient/family education;Passive range of motion   PT Next Visit Plan Strengthening, gait, stretching of LEs. balance   PT Home Exercise Plan Continue as prescribed   Consulted and Agree with Plan of Care  Patient;Family member/caregiver   Family Member Consulted one of pt daughters      Patient will benefit from skilled therapeutic intervention in order to improve the following deficits and impairments:  Decreased activity tolerance, Decreased balance, Impaired flexibility, Decreased mobility  Visit Diagnosis: Muscle weakness (generalized)  Difficulty in walking, not elsewhere classified     Problem List Patient Active Problem List   Diagnosis Date Noted  . Pneumonia 02/09/2015  . Popliteal artery  aneurysm (HCC) 10/13/2013  . Discoloration of skin of foot-Left 10/13/2013  . PAD (peripheral artery disease) (HCC)   . TIA (transient ischemic attack)   . Edema   . Hyperlipidemia   . Syncope   . CAD (coronary artery disease)   . Venous insufficiency   . Carotid artery disease (HCC)   . Elevated CPK   . Popliteal aneurysm (HCC)   . Speech problem   . Hx of CABG   . Ejection fraction   . Prominent abdominal aortic pulsation   . Intracranial hemorrhage (HCC)   . Bradycardia     Loralyn FreshwaterMiguel Ha Placeres PT, DPT   06/27/2015, 6:57 PM  [place] [facility] [address] [place], [place], [address] Phone: [phone]   Fax:  [phone]  Name: [name] [name] MRN: [id number] Date of Birth: 09/30/1921

## 2015-06-27 NOTE — Patient Instructions (Signed)
Gastroc / Heel Cord Stretch - Seated With Towel    Sit on your chair, towel around ball of foot. Gently pull foot in toward body, stretching heel cord and calf. Hold for __5_ seconds.  Repeat __5_ times. Do _3__ times per day. Perform for other leg as well.   Copyright  VHI. All rights reserved.

## 2015-06-29 ENCOUNTER — Ambulatory Visit: Payer: Medicare Other

## 2015-06-29 VITALS — HR 83

## 2015-06-29 DIAGNOSIS — R531 Weakness: Secondary | ICD-10-CM | POA: Diagnosis not present

## 2015-06-29 DIAGNOSIS — R262 Difficulty in walking, not elsewhere classified: Secondary | ICD-10-CM

## 2015-06-29 DIAGNOSIS — M6281 Muscle weakness (generalized): Secondary | ICD-10-CM

## 2015-06-29 NOTE — Therapy (Signed)
[place] [facility] PH[facility] AND [facility] MEDICINE [address] [place], [address] Phone: [phone]   Fax:  [phone]  Physical Therapy Treatment  Patient Details  Name: [name] [name] MRN: [id number] Date of Birth: [birth year] Referring Provider: Olevia Perches, DO  Encounter Date: 06/29/2015      PT End of Session - 06/29/15 1700    Visit Number 39   Number of Visits 59   Date for PT Re-Evaluation 07/27/15   Authorization Type 5   Authorization Time Period of 10   PT Start Time 1621   PT Stop Time 1700   PT Time Calculation (min) 39 min   Equipment Utilized During Treatment Gait belt;Other (comment)  rw   Activity Tolerance Patient tolerated treatment well;No increased pain;Patient limited by fatigue   Behavior During Therapy [facility] for tasks assessed/performed      Past Medical History  Diagnosis Date  . CAD (coronary artery disease)     nuclear 01/2008, no ischemia, EF 60%, distal anterior scar  . Hyperlipidemia   . Syncope     .remote..no etiology  /  syncope 11/2008.. from acute leg pain  . Venous insufficiency     support hose  . Carotid artery disease (HCC)     doppler 05/2009,  0-39% bilateral / doppler 06/2010 OK  . Elevated CPK     11/23/08.Marland KitchenMarland Kitchenprobably from debris from popliteal aneurysm  . Popliteal aneurysm (HCC)     .repair..11/25/2008  Dr. Darrick Penna  . Speech problem     Neurologic event with brief infusion and speech difficulty.... January, 2011.... being assessed by neurology  February, 2011  . Hx of CABG     1998  . Ejection fraction     EF 45%, echo,05/2009,apical hypo,  ?? posterior and lateral hypo  . Prominent abdominal aortic pulsation     doppler normal  . Intracranial hemorrhage (HCC)     Two small subependymal bleeds 06/2010,,ASA held  . Bradycardia     06/2010  . Edema     Ankle edema August, 2012  . TIA (transient ischemic attack)     Brief episode of confusion and speech difficulty, January, 2012  /   recurrent confusion speech difficulty September, 2012  . PAD (peripheral artery disease) (HCC)     Above the knee to below the knee popliteal bypass,, right  //    Doppler,  July, 2013, patent  . Fall at home July 2015    Past Surgical History  Procedure Laterality Date  . Coronary artery bypass graft  1998  . Popliteal bypass  with reversed greater saphenous vein from right leg.      Filed Vitals:   06/29/15 1621  Pulse: 83  SpO2: 97%        Subjective Assessment - 06/29/15 1621    Subjective R knee bothers him a little bit but not much. Not worth talking about it too much. R arm is worse 6-7/10 currently.    Patient is accompained by: Family member  Daughter.   Limitations Standing;Walking;House hold activities   Patient Stated Goals "To be able to walk again."   Currently in Pain? Yes  R arm   Pain Score 7   6-7/10     Objectives  Manual therapy  Soft tissue mobilization to R biceps secondary to pt stating arm bothering him when he pulls himself to perform wc to bed transfer at home  No R arm pain aftewards per pt.  Soft tissue mobilization to  R vastus lateralis secondary to discomfort when walking   Decreased muscle tension aftwards. Pt states feeling better after manual therapy.    There-ex  Attempted sit <> stand initially but pt had difficulty seconary to R arm pain  Sit <> stand multiple times after manual therapy mod A (pt states R arm did not bother him as much after manual therapy)    Improved to min A after 3rd attempt  Seated manually resisted leg press 10x each LE Seated hip flexion 10x each LE Seated manually resisted clam shells hips less than 90 degrees flexion 10x bilaterally Seated bilateral scap retract 10x5 seconds (96% SpO2)  gait with rw 8 ft with min A +1 (pt states R arm ok, some discomfort R lateral knee)    Improved exercise technique, movement at target joints, use of target muscles after mod to max verbal, visual, tactile cues.     Decreased R arm pain and improved ability to perform sit <> stand transfer from wc after manual therapy. Decreased R lateral knee discomfort after soft tissue mobilization to R vastus lateralis. Difficulty with gait today probably due to fatigue and R arm and lateral knee discomfort. Pt also demonstrates difficulty with placing his center of mass over his base of support and maintaining it there when performing sit <> stand transfers today.                         PT Education - 06/29/15 1902    Education provided Yes   Education Details ther-ex   Starwood HotelsPerson(s) Educated Patient   Methods Explanation;Demonstration;Tactile cues;Verbal cues   Comprehension Verbalized understanding;Returned demonstration             PT Long Term Goals - 06/13/15 1713    PT LONG TERM GOAL #1   Title Patient will demonstrate improved TUG score of  1 min  to decrease fall risk   Time 6   Period Weeks   Status On-going   PT LONG TERM GOAL #2   Title Patient will be able to ambulate with rw at least 70 ft with CGA +1 (second person pushing wheel chair) without rest to promote ability to ambulate around the house.    Time 6   Period Weeks   Status Revised   PT LONG TERM GOAL #3   Title Patient will demonstrate 5x STS in 1 min 45 seconds for improved strength and mobility at home.    Time 6   Period Weeks   Status On-going   PT LONG TERM GOAL #4   Title Patient will improve LEFS score to > 25/80 demonstrating improved function.   Time 6   Period Weeks   Status On-going               Plan - 06/29/15 1903    Clinical Impression Statement Decreased R arm pain and improved ability to perform sit <> stand transfer from wc after manual therapy. Decreased R lateral knee discomfort after soft tissue mobilization to R vastus lateralis. Difficulty with gait today probably due to fatigue and R arm and lateral knee discomfort. Pt also demonstrates difficulty with placing his center of  mass over his base of support and maintaining it there when performing sit <> stand transfers today.    Rehab Potential Fair   PT Frequency 2x / week   PT Duration 6 weeks   PT Treatment/Interventions ADLs/Self Care Home Management;Gait training;Therapeutic activities;Therapeutic exercise;Balance training;Wheelchair mobility training;Patient/family education;Passive range  of motion   PT Next Visit Plan Strengthening, gait, stretching of LEs. balance   PT Home Exercise Plan Continue as prescribed   Consulted and Agree with Plan of Care Patient;Family member/caregiver   Family Member Consulted one of pt daughters      Patient will benefit from skilled therapeutic intervention in order to improve the following deficits and impairments:  Decreased activity tolerance, Decreased balance, Impaired flexibility, Decreased mobility  Visit Diagnosis: Muscle weakness (generalized)  Difficulty in walking, not elsewhere classified     Problem List Patient Active Problem List   Diagnosis Date Noted  . Pneumonia 02/09/2015  . 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 Discoloration of skin of foot-Left 10/13/2013  . PAD (peripheral artery disease) (HCC)   . TIA (transient ischemic attack)   . Edema   . Hyperlipidemia   . Syncope   . CAD (coronary artery disease)   . Venous insufficiency   . Carotid artery disease (HCC)   . Elevated CPK   . Popliteal aneurysm (HCC)   . Speech problem   . Hx of CABG   . Ejection fraction   . Prominent abdominal aortic pulsation   . Intracranial hemorrhage (HCC)   . Bradycardia     Loralyn Freshwater PT, DPT   06/29/2015, 7:15 PM  [facility] PHYSICAL AND SPORTS MEDICINE [address] [place], [address] Phone: [phone]   Fax:  [phone]  Name: KETHAN PAPADOPOULOS MRN: [id number] Date of Birth: 04/12/1921

## 2015-07-04 ENCOUNTER — Ambulatory Visit: Payer: Medicare Other | Attending: Family Medicine | Admitting: Physical Therapy

## 2015-07-04 DIAGNOSIS — R262 Difficulty in walking, not elsewhere classified: Secondary | ICD-10-CM | POA: Insufficient documentation

## 2015-07-04 DIAGNOSIS — M6281 Muscle weakness (generalized): Secondary | ICD-10-CM | POA: Diagnosis present

## 2015-07-05 NOTE — Therapy (Signed)
[facility]IC[facility] [address] [place], [place], [address] Phone: [phone]   Fax:  [phone]  Physical Therapy Treatment  Patient Details  Name: [name] [name] MRN: [id number] Date of Birth: 09/01/1921 Referring Provider: Olevia PerchesMegan Johnson, DO  Encounter Date: 07/04/2015      PT End of Session - 07/05/15 0850    Visit Number 40   Number of Visits 59   Date for PT Re-Evaluation 07/27/15   Authorization Type 6   Authorization Time Period of 10   PT Start Time 1632   PT Stop Time 1700   PT Time Calculation (min) 28 min   Equipment Utilized During Treatment Gait belt;Other (comment)  RW   Activity Tolerance Patient tolerated treatment well   Behavior During Therapy [facility] for tasks assessed/performed      Past Medical History  Diagnosis Date  . CAD (coronary artery disease)     nuclear 01/2008, no ischemia, EF 60%, distal anterior scar  . Hyperlipidemia   . Syncope     .remote..no etiology  /  syncope 11/2008.. from acute leg pain  . Venous insufficiency     support hose  . Carotid artery disease (HCC)     doppler 05/2009,  0-39% bilateral / doppler 06/2010 OK  . Elevated CPK     11/23/08.Marland Kitchen.Marland Kitchen.probably from debris from popliteal aneurysm  . Popliteal aneurysm (HCC)     .repair..11/25/2008  Dr. Darrick PennaFields  . Speech problem     Neurologic event with brief infusion and speech difficulty.... January, 2011.... being assessed by neurology  February, 2011  . Hx of CABG     1998  . Ejection fraction     EF 45%, echo,05/2009,apical hypo,  ?? posterior and lateral hypo  . Prominent abdominal aortic pulsation     doppler normal  . Intracranial hemorrhage (HCC)     Two small subependymal bleeds 06/2010,,ASA held  . Bradycardia     06/2010  . Edema     Ankle edema August, 2012  . TIA (transient ischemic attack)     Brief episode of confusion and speech difficulty, January, 2012  /  recurrent confusion speech difficulty September, 2012   . PAD (peripheral artery disease) (HCC)     Above the knee to below the knee popliteal bypass,, right  //    Doppler,  July, 2013, patent  . Fall at home July 2015    Past Surgical History  Procedure Laterality Date  . Coronary artery bypass graft  1998  . Popliteal bypass  with reversed greater saphenous vein from right leg.      There were no vitals filed for this visit.      Subjective Assessment - 07/04/15 1636    Subjective Patient denies any complaints, says he has been exercising at home "but not the difficult ones". No pain reported today. States nothing else is new from previous session.    Patient is accompained by: Family member  Daughter.   Limitations Standing;Walking;House hold activities   Patient Stated Goals "To be able to walk again."   Currently in Pain? No/denies      SpO2- 99%, HR 89 bpm   Gait x 25', 16', 41' x 4 bouts (mod A x 1 to complete transfers cuing provided for hands on RW to encourage anterior trunk displacement, to bias knee extensors for transfers. Gait training included educating patient for increasing step lengths, more erect posture, being closer to RW for safety and efficiency).  LAQs x 10 bilaterally against manual resistance   Seated hip marching/flexion x 10 with 2#, x 10 with 8# bilaterally                             PT Education - 07/05/15 0850    Education provided Yes   Education Details Gait training (closer to RW, transfer techniques, more upright posture, longer strides).    Person(s) Educated Patient   Methods Explanation;Demonstration;Verbal cues   Comprehension Returned demonstration;Verbalized understanding;Verbal cues required;Need further instruction             PT Long Term Goals - 06/13/15 1713    PT LONG TERM GOAL #1   Title Patient will demonstrate improved TUG score of  1 min  to decrease fall risk   Time 6   Period Weeks   Status On-going   PT LONG TERM GOAL #2   Title Patient  will be able to ambulate with rw at least 70 ft with CGA +1 (second person pushing wheel chair) without rest to promote ability to ambulate around the house.    Time 6   Period Weeks   Status Revised   PT LONG TERM GOAL #3   Title Patient will demonstrate 5x STS in 1 min 45 seconds for improved strength and mobility at home.    Time 6   Period Weeks   Status On-going   PT LONG TERM GOAL #4   Title Patient will improve LEFS score to > 25/80 demonstrating improved function.   Time 6   Period Weeks   Status On-going               Plan - 07/05/15 0851    Clinical Impression Statement Patient seems to be reporting less pain today, though perhaps increasing difficulty with sit to stand transfer. He is able to ambulate further distances than recent sessions today, PT provided cuing for nose over toes, use of UEs to complete transfers and longer strides to increase foot clearance. Vitals remained WNL throughout session.    Rehab Potential Fair   PT Frequency 2x / week   PT Duration 6 weeks   PT Treatment/Interventions ADLs/Self Care Home Management;Gait training;Therapeutic activities;Therapeutic exercise;Balance training;Wheelchair mobility training;Patient/family education;Passive range of motion   PT Next Visit Plan Strengthening, gait, stretching of LEs. balance   PT Home Exercise Plan Continue as prescribed   Consulted and Agree with Plan of Care Patient;Family member/caregiver   Family Member Consulted one of pt daughters      Patient will benefit from skilled therapeutic intervention in order to improve the following deficits and impairments:  Decreased activity tolerance, Decreased balance, Impaired flexibility, Decreased mobility  Visit Diagnosis: Muscle weakness (generalized)  Difficulty in walking, not elsewhere classified     Problem List Patient Active Problem List   Diagnosis Date Noted  . Pneumonia 02/09/2015  . 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  Discoloration of skin of foot-Left 10/13/2013  . PAD (peripheral artery disease) (HCC)   . TIA (transient ischemic attack)   . Edema   . Hyperlipidemia   . Syncope   . CAD (coronary artery disease)   . Venous insufficiency   . Carotid artery disease (HCC)   . Elevated CPK   . Popliteal aneurysm (HCC)   . Speech problem   . Hx of CABG   . Ejection fraction   . Prominent abdominal aortic pulsation   . Intracranial hemorrhage (HCC)   .  Bradycardia    Kerin Ransom, PT, DPT    07/05/2015, 8:56 AM  [place] [facility] PHYSICAL AND SPORTS MEDICINE [address], [place], [address] Phone: [phone]   Fax:  [phone]  Name: [name] [name] MRN: [id number] Date of Birth: 09-Jun-1921

## 2015-07-06 ENCOUNTER — Ambulatory Visit: Payer: Medicare Other | Admitting: Physical Therapy

## 2015-07-06 DIAGNOSIS — M6281 Muscle weakness (generalized): Secondary | ICD-10-CM | POA: Diagnosis not present

## 2015-07-06 DIAGNOSIS — R262 Difficulty in walking, not elsewhere classified: Secondary | ICD-10-CM

## 2015-07-07 NOTE — Therapy (Signed)
[place] [facility] [address] [place], [place], [address] Phone: [phone]   Fax:  [phone]  Physical Therapy Treatment  Patient Details  Name: [name] Palmerlbert L Whiting MRN: [id number] Date of Birth: 09/08/1921 Referring Provider: Olevia PerchesMegan Johnson, DO  Encounter Date: 07/06/2015      PT End of Session - 07/06/15 1727    Visit Number 41   Number of Visits 59   Date for PT Re-Evaluation 07/27/15   Authorization Type 7   Authorization Time Period of 10   PT Start Time 1645   PT Stop Time 1720   PT Time Calculation (min) 35 min   Equipment Utilized During Treatment Gait belt;Other (comment)  RW   Activity Tolerance Patient tolerated treatment well   Behavior During Therapy [facility] for tasks assessed/performed      Past Medical History  Diagnosis Date  . CAD (coronary artery disease)     nuclear 01/2008, no ischemia, EF 60%, distal anterior scar  . Hyperlipidemia   . Syncope     .remote..no etiology  /  syncope 11/2008.. from acute leg pain  . Venous insufficiency     support hose  . Carotid artery disease (HCC)     doppler 05/2009,  0-39% bilateral / doppler 06/2010 OK  . Elevated CPK     11/23/08.Marland Kitchen.Marland Kitchen.probably from debris from popliteal aneurysm  . Popliteal aneurysm (HCC)     .repair..11/25/2008  Dr. Darrick PennaFields  . Speech problem     Neurologic event with brief infusion and speech difficulty.... January, 2011.... being assessed by neurology  February, 2011  . Hx of CABG     1998  . Ejection fraction     EF 45%, echo,05/2009,apical hypo,  ?? posterior and lateral hypo  . Prominent abdominal aortic pulsation     doppler normal  . Intracranial hemorrhage (HCC)     Two small subependymal bleeds 06/2010,,ASA held  . Bradycardia     06/2010  . Edema     Ankle edema August, 2012  . TIA (transient ischemic attack)     Brief episode of confusion and speech difficulty, January, 2012  /  recurrent confusion speech difficulty September, 2012   . PAD (peripheral artery disease) (HCC)     Above the knee to below the knee popliteal bypass,, right  //    Doppler,  July, 2013, patent  . Fall at home July 2015    Past Surgical History  Procedure Laterality Date  . Coronary artery bypass graft  1998  . Popliteal bypass  with reversed greater saphenous vein from right leg.      There were no vitals filed for this visit.      Subjective Assessment - 07/06/15 1725    Subjective Patient reports he was a little sore after the last session, otherwise he offers no new complaints. He reports he has been doing his seated exercises earlier today.    Patient is accompained by: Family member  Daughter    Limitations Standing;Walking;House hold activities   How long can you walk comfortably? 2 min   Patient Stated Goals "To be able to walk again."   Currently in Pain? No/denies      4759', 91' 67' --> 3 bouts of gait with no breaks with RW  LAQs to begin session as warm up x 10, after last gait x 15 bilaterally, progressed to 2# ankle weights x 12 for 2 sets bilaterally   Sit to stand transfers from highest  setting on black mat table x 5, cuing to keep nose over toes then come erect with shoulders. He was requiring mod A x 1 to transfer from w/c, from highest setting once cued appropriately he could perform STS with cga x1. Mat table lowered and patient able to complete x4 with cga-min A x1 though requiring increased cuing for upright posture throughout transfer and nose over toes.                             PT Education - 07/06/15 1726    Education provided Yes   Education Details Sit to stand technique, gait training with increased foot clearance.    Person(s) Educated Patient   Methods Explanation;Demonstration   Comprehension Verbalized understanding;Returned demonstration             PT Long Term Goals - 06/13/15 1713    PT LONG TERM GOAL #1   Title Patient will demonstrate improved TUG score of  1  min  to decrease fall risk   Time 6   Period Weeks   Status On-going   PT LONG TERM GOAL #2   Title Patient will be able to ambulate with rw at least 70 ft with CGA +1 (second person pushing wheel chair) without rest to promote ability to ambulate around the house.    Time 6   Period Weeks   Status Revised   PT LONG TERM GOAL #3   Title Patient will demonstrate 5x STS in 1 min 45 seconds for improved strength and mobility at home.    Time 6   Period Weeks   Status On-going   PT LONG TERM GOAL #4   Title Patient will improve LEFS score to > 25/80 demonstrating improved function.   Time 6   Period Weeks   Status On-going               Plan - 07/06/15 1728    Clinical Impression Statement Pt demonstrates significant improvement in gait speed/endurance in this session. He continues to require cuing for improved foot clearance in gait as well as more efficient sit to stand transfer mechanics. Overall he appears to be benefitting from therapy with impressive increase in distance ambulated in this session.    Rehab Potential Fair   PT Frequency 2x / week   PT Duration 6 weeks   PT Treatment/Interventions ADLs/Self Care Home Management;Gait training;Therapeutic activities;Therapeutic exercise;Balance training;Wheelchair mobility training;Patient/family education;Passive range of motion   PT Next Visit Plan Strengthening, gait, stretching of LEs. balance   PT Home Exercise Plan Continue as prescribed   Consulted and Agree with Plan of Care Patient;Family member/caregiver   Family Member Consulted one of pt daughters      Patient will benefit from skilled therapeutic intervention in order to improve the following deficits and impairments:  Decreased activity tolerance, Decreased balance, Impaired flexibility, Decreased mobility  Visit Diagnosis: Muscle weakness (generalized)  Difficulty in walking, not elsewhere classified     Problem List Patient Active Problem List    Diagnosis Date Noted  . Pneumonia 02/09/2015  . 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 Discoloration of skin of foot-Left 10/13/2013  . PAD (peripheral artery disease) (HCC)   . TIA (transient ischemic attack)   . Edema   . Hyperlipidemia   . Syncope   . CAD (coronary artery disease)   . Venous insufficiency   . Carotid artery disease (HCC)   . Elevated CPK   .  Popliteal aneurysm (HCC)   . Speech problem   . Hx of CABG   . Ejection fraction   . Prominent abdominal aortic pulsation   . Intracranial hemorrhage (HCC)   . Bradycardia    Kerin Ransom, PT, DPT    07/07/2015, 4:17 PM  [place] [facility] PHYSICAL AND SPORTS MEDICINE [address], [place], [address] Phone: [phone]   Fax:  [phone]  Name: ADONIAS DEMORE MRN: [id number] Date of Birth: August 12, 1921

## 2015-07-11 ENCOUNTER — Ambulatory Visit: Payer: Medicare Other

## 2015-07-11 DIAGNOSIS — R262 Difficulty in walking, not elsewhere classified: Secondary | ICD-10-CM

## 2015-07-11 DIAGNOSIS — M6281 Muscle weakness (generalized): Secondary | ICD-10-CM

## 2015-07-11 NOTE — Therapy (Signed)
[place] [facility] [address], [place], [address] Phone: [phone]   Fax:  [phone]  Physical Therapy Treatment  Patient Details  Name: [name] [name] MRN: [id number] Date of Birth: [birth year] Referring Provider: Olevia Perches, DO  Encounter Date: 07/11/2015      PT End of Session - 07/11/15 1751    Visit Number 42   Number of Visits 59   Date for PT Re-Evaluation 07/27/15   Authorization Type 8   Authorization Time Period of 10   PT Start Time 1751   PT Stop Time 1834   PT Time Calculation (min) 43 min   Equipment Utilized During Treatment Gait belt;Other (comment)  RW   Activity Tolerance Patient tolerated treatment well   Behavior During Therapy [facility] for tasks assessed/performed      Past Medical History  Diagnosis Date  . CAD (coronary artery disease)     nuclear 01/2008, no ischemia, EF 60%, distal anterior scar  . Hyperlipidemia   . Syncope     .remote..no etiology  /  syncope 11/2008.. from acute leg pain  . Venous insufficiency     support hose  . Carotid artery disease (HCC)     doppler 05/2009,  0-39% bilateral / doppler 06/2010 OK  . Elevated CPK     11/23/08.Marland KitchenMarland Kitchenprobably from debris from popliteal aneurysm  . Popliteal aneurysm (HCC)     .repair..11/25/2008  Dr. Darrick Penna  . Speech problem     Neurologic event with brief infusion and speech difficulty.... January, 2011.... being assessed by neurology  February, 2011  . Hx of CABG     1998  . Ejection fraction     EF 45%, echo,05/2009,apical hypo,  ?? posterior and lateral hypo  . Prominent abdominal aortic pulsation     doppler normal  . Intracranial hemorrhage (HCC)     Two small subependymal bleeds 06/2010,,ASA held  . Bradycardia     06/2010  . Edema     Ankle edema August, 2012  . TIA (transient ischemic attack)     Brief episode of confusion and speech difficulty, January, 2012  /  recurrent confusion speech difficulty September, 2012   . PAD (peripheral artery disease) (HCC)     Above the knee to below the knee popliteal bypass,, right  //    Doppler,  July, 2013, patent  . Fall at home July 2015    Past Surgical History  Procedure Laterality Date  . Coronary artery bypass graft  1998  . Popliteal bypass  with reversed greater saphenous vein from right leg.      Filed Vitals:   07/11/15 1752  SpO2: 96%        Subjective Assessment - 07/11/15 1752    Subjective Pt states that he is doing good. Some R arm pain 6/10  currently. Reaching up might have caused it. Has had it last week too   Patient is accompained by: Family member  Daughter    Limitations Standing;Walking;House hold activities   How long can you walk comfortably? 2 min   Patient Stated Goals "To be able to walk again."   Currently in Pain? Yes   Pain Score 6   R arm pain   Multiple Pain Sites No        Objectives  Manual therapy:  STM to R biceps  Felt better per pt.   There-ex  Gait 47 ft (96% SpO2), 36 ft, 67 ft  with CGA +  1  Seated bilateral scapular retraction 5x5-10 seconds (to promote SpO2)  LAQ 12x, then 2x12 with 2 lb ankle weight  Seated hip flexion resisting 2 lbs 10x2  Seated clam shells (hips less than 90 degrees flexion) resisting green band 10x2  Sit <> stand from wc throughout session multiple times with level of assist varying from mod A to SBA, cues for bringing center of gravity over base of support and hand placement.    Improved exercise technique, movement at target joints, use of target muscles after mod to max (max cues for sit <> stand and gait) verbal, visual, tactile cues.      Pt able to ambulate up to 67 ft today with CGA +1 with mod to max cues for increasing hip and knee flexion for foot clearance, and step length. Sit <> stand transfers range from mod A to SBA with mod to max cues for proper technique.                         PT Education - 07/11/15 1839    Education  provided Yes   Education Details ther-ex   [employer]) Educated Patient   Methods Explanation;Demonstration;Tactile cues;Verbal cues   Comprehension Verbalized understanding;Returned demonstration             PT Long Term Goals - 06/13/15 1713    PT LONG TERM GOAL #1   Title Patient will demonstrate improved TUG score of  1 min  to decrease fall risk   Time 6   Period Weeks   Status On-going   PT LONG TERM GOAL #2   Title Patient will be able to ambulate with rw at least 70 ft with CGA +1 (second person pushing wheel chair) without rest to promote ability to ambulate around the house.    Time 6   Period Weeks   Status Revised   PT LONG TERM GOAL #3   Title Patient will demonstrate 5x STS in 1 min 45 seconds for improved strength and mobility at home.    Time 6   Period Weeks   Status On-going   PT LONG TERM GOAL #4   Title Patient will improve LEFS score to > 25/80 demonstrating improved function.   Time 6   Period Weeks   Status On-going               Plan - 07/11/15 1839    Clinical Impression Statement Pt able to ambulate up to 67 ft today with CGA +1 with mod to max cues for increasing hip and knee flexion for foot clearance, and step length. Sit <> stand transfers range from mod A to SBA with mod to max cues for proper technique.    Rehab Potential Fair   PT Frequency 2x / week   PT Duration 6 weeks   PT Treatment/Interventions ADLs/Self Care Home Management;Gait training;Therapeutic activities;Therapeutic exercise;Balance training;Wheelchair mobility training;Patient/family education;Passive range of motion   PT Next Visit Plan Strengthening, gait, stretching of LEs. balance   PT Home Exercise Plan Continue as prescribed   Consulted and Agree with Plan of Care Patient;Family member/caregiver   Family Member Consulted one of pt daughters      Patient will benefit from skilled therapeutic intervention in order to improve the following deficits and  impairments:  Decreased activity tolerance, Decreased balance, Impaired flexibility, Decreased mobility  Visit Diagnosis: Muscle weakness (generalized)  Difficulty in walking, not elsewhere classified     Problem List Patient  Active Problem List   Diagnosis Date Noted  . Pneumonia 02/09/2015  . 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 Discoloration of skin of foot-Left 10/13/2013  . PAD (peripheral artery disease) (HCC)   . TIA (transient ischemic attack)   . Edema   . Hyperlipidemia   . Syncope   . CAD (coronary artery disease)   . Venous insufficiency   . Carotid artery disease (HCC)   . Elevated CPK   . Popliteal aneurysm (HCC)   . Speech problem   . Hx of CABG   . Ejection fraction   . Prominent abdominal aortic pulsation   . Intracranial hemorrhage (HCC)   . Bradycardia     Loralyn FreshwaterMiguel Asiyah Pineau PT, DPT   07/11/2015, 6:50 PM   [facility] [address] [place], [place], [address] Phone: [phone]   Fax:  [phone]  Name: [name] [name] MRN: [id number] Date of Birth: 07/29/1921

## 2015-07-13 ENCOUNTER — Ambulatory Visit: Payer: Medicare Other

## 2015-07-13 VITALS — HR 99

## 2015-07-13 DIAGNOSIS — R262 Difficulty in walking, not elsewhere classified: Secondary | ICD-10-CM

## 2015-07-13 DIAGNOSIS — M6281 Muscle weakness (generalized): Secondary | ICD-10-CM

## 2015-07-13 NOTE — Therapy (Signed)
[place] [facility] [address] [place], [address] Phone: [phone]   Fax:  [phone]  Physical Therapy Treatment  Patient Details  Name: RAYLEN TANGONAN MRN: [id number] Date of Birth: 04-Jul-1921 Referring Provider: Olevia Perches, DO  Encounter Date: 07/13/2015      PT End of Session - 07/13/15 1749    Visit Number 43   Number of Visits 59   Date for PT Re-Evaluation 07/27/15   Authorization Type 9   Authorization Time Period of 10   PT Start Time 1749   PT Stop Time 1836   PT Time Calculation (min) 47 min   Equipment Utilized During Treatment Gait belt;Other (comment)  RW   Activity Tolerance Patient tolerated treatment well   Behavior During Therapy [facility] for tasks assessed/performed      Past Medical History  Diagnosis Date  . CAD (coronary artery disease)     nuclear 01/2008, no ischemia, EF 60%, distal anterior scar  . Hyperlipidemia   . Syncope     .remote..no etiology  /  syncope 11/2008.. from acute leg pain  . Venous insufficiency     support hose  . Carotid artery disease (HCC)     doppler 05/2009,  0-39% bilateral / doppler 06/2010 OK  . Elevated CPK     11/23/08.Marland KitchenMarland Kitchenprobably from debris from popliteal aneurysm  . Popliteal aneurysm (HCC)     .repair..11/25/2008  Dr. Darrick Penna  . Speech problem     Neurologic event with brief infusion and speech difficulty.... January, 2011.... being assessed by neurology  February, 2011  . Hx of CABG     1998  . Ejection fraction     EF 45%, echo,05/2009,apical hypo,  ?? posterior and lateral hypo  . Prominent abdominal aortic pulsation     doppler normal  . Intracranial hemorrhage (HCC)     Two small subependymal bleeds 06/2010,,ASA held  . Bradycardia     06/2010  . Edema     Ankle edema August, 2012  . TIA (transient ischemic attack)     Brief episode of confusion and speech difficulty, January, 2012  /  recurrent confusion speech difficulty September,  2012  . PAD (peripheral artery disease) (HCC)     Above the knee to below the knee popliteal bypass,, right  //    Doppler,  July, 2013, patent  . Fall at home July 2015    Past Surgical History  Procedure Laterality Date  . Coronary artery bypass graft  1998  . Popliteal bypass  with reversed greater saphenous vein from right leg.      Filed Vitals:   07/13/15 1750  Pulse: 99  SpO2: 95%        Subjective Assessment - 07/13/15 1751    Subjective R arm is better today. Still has some pain but better.   Patient is accompained by: Family member  Daughter    Limitations Standing;Walking;House hold activities   How long can you walk comfortably? 2 min   Patient Stated Goals "To be able to walk again."   Currently in Pain? Yes   Pain Score --  no pain level provided.    Multiple Pain Sites No          Objectives  Manual therapy:  STM to R biceps Felt "much much better" per pt.   There-ex  Gait with rw, CGA + 1 for 10 ft, (95% SpO2), 45 ft (95% SpO2), 25 ft (95% SpO2)  Seated LAQ 12x each LE  Seated manually resisted leg press 10x3 each LE  Seated bilateral scapular retraction 10x5 seconds   Sit <> stand transfers throughout session min to mod A  Seated clam shells (hips less than 90 degrees flexion) resisting green band 10x3   Improved exercise technique, movement at target joints, use of target muscles after mod to max verbal, visual, tactile cues.            PT Education - 07/13/15 1840    Education provided Yes   Education Details ther-ex, increasing hip and knee flexion and step length during gait, sit <> stand technique.   Person(s) Educated Patient   Methods Explanation;Demonstration;Tactile cues;Verbal cues   Comprehension Verbalized understanding;Returned demonstration             PT Long Term Goals - 06/13/15 1713    PT LONG TERM GOAL #1   Title Patient will demonstrate improved TUG score of  1 min  to decrease fall risk    Time 6   Period Weeks   Status On-going   PT LONG TERM GOAL #2   Title Patient will be able to ambulate with rw at least 70 ft with CGA +1 (second person pushing wheel chair) without rest to promote ability to ambulate around the house.    Time 6   Period Weeks   Status Revised   PT LONG TERM GOAL #3   Title Patient will demonstrate 5x STS in 1 min 45 seconds for improved strength and mobility at home.    Time 6   Period Weeks   Status On-going   PT LONG TERM GOAL #4   Title Patient will improve LEFS score to > 25/80 demonstrating improved function.   Time 6   Period Weeks   Status On-going               Plan - 07/13/15 1840    Clinical Impression Statement Pt demonstrates difficulty with sit <> stand, needing min to mod A consistently, and with gait today. Continues to need mod to max cues for proper sit <> stand technique as well as increasing hip and knee flexion, step length, and staying close to his rw while walking. Improved R arm symptoms. Manual techniques performed for R arm to decrease pain and improve ability to use R UE to help him perform transfers and use the rw to ambulate.    Rehab Potential Fair   PT Frequency 2x / week   PT Duration 6 weeks   PT Treatment/Interventions ADLs/Self Care Home Management;Gait training;Therapeutic activities;Therapeutic exercise;Balance training;Wheelchair mobility training;Patient/family education;Passive range of motion   PT Next Visit Plan Strengthening, gait, stretching of LEs. balance   PT Home Exercise Plan Continue as prescribed   Consulted and Agree with Plan of Care Patient;Family member/caregiver   Family Member Consulted one of pt daughters      Patient will benefit from skilled therapeutic intervention in order to improve the following deficits and impairments:  Decreased activity tolerance, Decreased balance, Impaired flexibility, Decreased mobility  Visit Diagnosis: Muscle weakness (generalized)  Difficulty  in walking, not elsewhere classified     Problem List Patient Active Problem List   Diagnosis Date Noted  . Pneumonia 02/09/2015  . 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 Discoloration of skin of foot-Left 10/13/2013  . PAD (peripheral artery disease) (HCC)   . TIA (transient ischemic attack)   . Edema   . Hyperlipidemia   . Syncope   . CAD (  coronary artery disease)   . Venous insufficiency   . Carotid artery disease (HCC)   . Elevated CPK   . Popliteal aneurysm (HCC)   . Speech problem   . Hx of CABG   . Ejection fraction   . Prominent abdominal aortic pulsation   . Intracranial hemorrhage (HCC)   . Bradycardia     Loralyn FreshwaterMiguel Julen Rubert PT, DPT   07/13/2015, 6:49 PM  [place] [facility] [address] [place], [place], [address] Phone: [phone]   Fax:  [phone]  Name: Darden Palmerlbert L Tomasini MRN: [id number] Date of Birth: 03/20/1921

## 2015-07-17 ENCOUNTER — Encounter: Payer: Self-pay | Admitting: Family Medicine

## 2015-07-17 ENCOUNTER — Ambulatory Visit: Payer: Medicare Other

## 2015-07-17 ENCOUNTER — Ambulatory Visit (INDEPENDENT_AMBULATORY_CARE_PROVIDER_SITE_OTHER): Payer: Medicare Other | Admitting: Family Medicine

## 2015-07-17 VITALS — HR 94

## 2015-07-17 VITALS — BP 116/72 | HR 84 | Temp 97.4°F

## 2015-07-17 DIAGNOSIS — S46911A Strain of unspecified muscle, fascia and tendon at shoulder and upper arm level, right arm, initial encounter: Secondary | ICD-10-CM | POA: Diagnosis not present

## 2015-07-17 DIAGNOSIS — R262 Difficulty in walking, not elsewhere classified: Secondary | ICD-10-CM

## 2015-07-17 DIAGNOSIS — J189 Pneumonia, unspecified organism: Secondary | ICD-10-CM

## 2015-07-17 DIAGNOSIS — J181 Lobar pneumonia, unspecified organism: Principal | ICD-10-CM

## 2015-07-17 DIAGNOSIS — M6281 Muscle weakness (generalized): Secondary | ICD-10-CM

## 2015-07-17 MED ORDER — AZITHROMYCIN 250 MG PO TABS: ORAL_TABLET | ORAL | Status: DC

## 2015-07-17 NOTE — Progress Notes (Signed)
BP 116/72 mmHg  Pulse 84  Temp(Src) 97.4 F (36.3 C)  Ht   Wt   SpO2 99%   Subjective:    Patient ID: [name] [name], male    DOB: 11/08/1921, 82 y.o.   MRN: [id number]  HPI: [name] [name] is a 82 y.o. male  Chief Complaint  Patient presents with  . URI  . Arm Pain    right   Patient with complaints of right arm pain biceps area more at shoulder and also biceps insertion at elbow area tenderness and throughout biceps no change with muscle activation no evidence of rupture improved with some massage. Patient uses his right arm and hand to assist with standing pretty much exclusively.  Patient also with cold congestion drainage which is been starting as typical springtime allergy stuff but really gotten worse and with history of pneumonia of 2 episodes this last year is concerned with cough now productive no overt fever but feverish and feeling bad. History assist by his daughter who accompanies patient.  Relevant past medical, surgical, family and social history reviewed and updated as indicated. Interim medical history since our last visit reviewed. Allergies and medications reviewed and updated.  Review of Systems  Constitutional: Positive for chills and fatigue. Negative for fever.  Respiratory: Positive for cough and wheezing. Negative for apnea.   Cardiovascular: Negative.     Per HPI unless specifically indicated above     Objective:    BP 116/72 mmHg  Pulse 84  Temp(Src) 97.4 F (36.3 C)  Ht   Wt   SpO2 99%  Wt Readings from Last 3 Encounters:  05/04/15 212 lb (96.163 kg)  02/09/15 203 lb (92.08 kg)  12/07/14 205 lb (92.987 kg)    Physical Exam  Constitutional: He is oriented to person, place, and time. He appears well-developed and well-nourished. No distress.  HENT:  Head: Normocephalic and atraumatic.  Right Ear: Hearing normal.  Left Ear: Hearing normal.  Nose: Nose normal.  Eyes: Conjunctivae and lids are normal. Right eye exhibits no discharge.  Left eye exhibits no discharge. No scleral icterus.  Cardiovascular:  Some left lower lung slight rales  Pulmonary/Chest: Effort normal. No respiratory distress.  Musculoskeletal: Normal range of motion.  Right biceps area tenderness no change of rupture.  Neurological: He is alert and oriented to person, place, and time.  Skin: Skin is intact. No rash noted.  Psychiatric: He has a normal mood and affect. His speech is normal and behavior is normal. Judgment and thought content normal. Cognition and memory are normal.    Results for orders placed or performed in visit on 06/14/15  Comprehensive metabolic panel  Result Value Ref Range   Glucose 98 65 - 99 mg/dL   BUN 15 10 - 36 mg/dL   Creatinine, Ser 9.560.65 (L) 0.76 - 1.27 mg/dL   GFR calc non Af Amer 85 >59 mL/min/1.73   GFR calc Af Amer 98 >59 mL/min/1.73   BUN/Creatinine Ratio 23 10 - 24   Sodium 138 134 - 144 mmol/L   Potassium 4.7 3.5 - 5.2 mmol/L   Chloride 96 96 - 106 mmol/L   CO2 30 (H) 18 - 29 mmol/L   Calcium 9.2 8.6 - 10.2 mg/dL   Total Protein 6.4 6.0 - 8.5 g/dL   Albumin 3.6 3.2 - 4.6 g/dL   Globulin, Total 2.8 1.5 - 4.5 g/dL   Albumin/Globulin Ratio 1.3 1.2 - 2.2   Bilirubin Total 0.3 0.0 - 1.2 mg/dL  Alkaline Phosphatase 63 39 - 117 IU/L   AST 22 0 - 40 IU/L   ALT 11 0 - 44 IU/L  Lipid panel  Result Value Ref Range   Cholesterol, Total 134 100 - 199 mg/dL   Triglycerides 191 0 - 149 mg/dL   HDL 40 >47 mg/dL   VLDL Cholesterol Cal 23 5 - 40 mg/dL   LDL Calculated 71 0 - 99 mg/dL   Chol/HDL Ratio 3.4 0.0 - 5.0 ratio units  CBC with Differential/Platelet  Result Value Ref Range   WBC 6.5 3.4 - 10.8 x10E3/uL   RBC 4.29 4.14 - 5.80 x10E6/uL   Hemoglobin 13.0 12.6 - 17.7 g/dL   Hematocrit 82.9 56.2 - 51.0 %   MCV 92 79 - 97 fL   MCH 30.3 26.6 - 33.0 pg   MCHC 33.0 31.5 - 35.7 g/dL   RDW 13.0 86.5 - 78.4 %   Platelets 185 150 - 379 x10E3/uL   Neutrophils 62 %   Lymphs 25 %   Monocytes 7 %   Eos 5 %    Basos 1 %   Neutrophils Absolute 4.0 1.4 - 7.0 x10E3/uL   Lymphocytes Absolute 1.6 0.7 - 3.1 x10E3/uL   Monocytes Absolute 0.4 0.1 - 0.9 x10E3/uL   EOS (ABSOLUTE) 0.3 0.0 - 0.4 x10E3/uL   Basophils Absolute 0.0 0.0 - 0.2 x10E3/uL   Immature Granulocytes 0 %   Immature Grans (Abs) 0.0 0.0 - 0.1 x10E3/uL  TSH  Result Value Ref Range   TSH 2.400 0.450 - 4.500 uIU/mL  Urinalysis, Routine w reflex microscopic (not at [facility])  Result Value Ref Range   Specific Gravity, UA 1.015 1.005 - 1.030   pH, UA 7.5 5.0 - 7.5   Color, UA Yellow Yellow   Appearance Ur Clear Clear   Leukocytes, UA Negative Negative   Protein, UA Negative Negative/Trace   Glucose, UA Negative Negative   Ketones, UA Negative Negative   RBC, UA Negative Negative   Bilirubin, UA Negative Negative   Urobilinogen, Ur 1.0 0.2 - 1.0 mg/dL   Nitrite, UA Negative Negative      Assessment & Plan:   Problem List Items Addressed This Visit      Respiratory   Pneumonia - Primary    Changes consistent with early pneumonia Will treat with azithromycin as his responded well to that in the past avoiding  quinolones for now due to concern about tendon rupture Discuss x-ray patient got x-ray within the last 6 months with no follow-up indicated will not get x-ray this time.      Relevant Medications   Loratadine (CLARITIN REDITABS) 5 MG TBDP   azithromycin (ZITHROMAX) 250 MG tablet    Other Visit Diagnoses    Strain of upper arm, right, initial encounter        Discuss arm care trying to limit motion doing lifting and a different fashion        Follow up plan: Return for As scheduled.

## 2015-07-17 NOTE — Therapy (Signed)
[place] [facility] PH[facility] MEDICINE [address] [place], [place], [address] Phone: [phone]   Fax:  [phone]  Physical Therapy Treatment And Progress Report  Patient Details  Name: [name] [name] MRN: [id number] Date of Birth: 03/30/1921 Referring Provider: Olevia PerchesMegan Johnson, DO  Encounter Date: 07/17/2015      PT End of Session - 07/17/15 1649    Visit Number 44   Number of Visits 59   Date for PT Re-Evaluation 07/27/15   Authorization Type 1   Authorization Time Period of 10   PT Start Time 1649   PT Stop Time 1746   PT Time Calculation (min) 57 min   Equipment Utilized During Treatment Gait belt;Other (comment)  RW   Activity Tolerance Patient tolerated treatment well   Behavior During Therapy [facility] for tasks assessed/performed      Past Medical History  Diagnosis Date  . CAD (coronary artery disease)     nuclear 01/2008, no ischemia, EF 60%, distal anterior scar  . Hyperlipidemia   . Syncope     .remote..no etiology  /  syncope 11/2008.. from acute leg pain  . Venous insufficiency     support hose  . Carotid artery disease (HCC)     doppler 05/2009,  0-39% bilateral / doppler 06/2010 OK  . Elevated CPK     11/23/08.Marland Kitchen.Marland Kitchen.probably from debris from popliteal aneurysm  . Popliteal aneurysm (HCC)     .repair..11/25/2008  Dr. Darrick PennaFields  . Speech problem     Neurologic event with brief infusion and speech difficulty.... January, 2011.... being assessed by neurology  February, 2011  . Hx of CABG     1998  . Ejection fraction     EF 45%, echo,05/2009,apical hypo,  ?? posterior and lateral hypo  . Prominent abdominal aortic pulsation     doppler normal  . Intracranial hemorrhage (HCC)     Two small subependymal bleeds 06/2010,,ASA held  . Bradycardia     06/2010  . Edema     Ankle edema August, 2012  . TIA (transient ischemic attack)     Brief episode of confusion and speech difficulty, January, 2012  /  recurrent confusion speech  difficulty September, 2012  . PAD (peripheral artery disease) (HCC)     Above the knee to below the knee popliteal bypass,, right  //    Doppler,  July, 2013, patent  . Fall at home July 2015    Past Surgical History  Procedure Laterality Date  . Coronary artery bypass graft  1998  . Popliteal bypass  with reversed greater saphenous vein from right leg.      Filed Vitals:   07/17/15 1653  Pulse: 94  SpO2: 95%        Subjective Assessment - 07/17/15 1701    Subjective R arm at biceps area still hurts. The NuStep arm handles helps the arm during the exercise.   Patient is accompained by: Family member  Daughter    Limitations Standing;Walking;House hold activities   How long can you walk comfortably? 2 min   Patient Stated Goals "To be able to walk again."   Currently in Pain? Yes   Pain Score --  R biceps pain, no level provided   Multiple Pain Sites No            [facility] PT Assessment - 07/17/15 1910    Observation/Other Assessments   Observations TUG: 1 min 32 seconds, and 2 min 36 seconds. Pt unable to  walk all the way back both times. Min to mod A to stand and sit.       Objectives  There-ex  Directed patient with stand pivot transfer from wc to NuStep mod A   Sit <> stand transfers throughout session min to mod A.   NuStep x 5 min. SpO2 and heart rate at beginning: 95%, 92 BPM. SpO2 and hear rate at end of NuStep 98%, 92 BPM  Gait 28 ft (96% SpO2), 38 ft (93% SpO2) with CGA to min A at times +1 (second person pushing wc)  Standing up from a chair, walking 10 ft forward, then returning 8 ft, then sitting back onto chair 2x  1 min 32 seconds first try, 2 min 36 seconds on second try.   Pt unable to walk the full 10 ft to return to wc and turn around to sit on wc. Wc was brought to pt both times  Max cues to keep rw close to pt when turning and walking.     Seated manually resisted leg press 10x3 each LE  Seated hip flexion resisting 2 lbs 10x2 then  without weight 10x5 seconds    Improved exercise technique, movement at target joints, use of target muscles after mod to max verbal, visual, tactile cues.   Pt demonstrates difficulty with turning around when trying to perform the TUG test, needing max cues to turn his rw in small increments secondary to tendency to turn his rw about 80 degrees, making it difficult for the rest of his body to catch up.  Pt also needed consistent cues to stay closer to his rw, pick up his feet to promote better foot clearance, and increase in step length during gait. Pt demonstrates days when he is able to ambulate longer distances, walking up to 91 ft 2 weeks ago. Pt however averages between 25 ft - 40 ft. Able to perform sit <> stand transfers with min to mod A on average, needing consistent cues for forward trunk placement to place his center of gravity over his base to support to help him stand. Pt still demonstrates LE weakness, decreased endurance with gait, difficulty with turning and performing transfers. Pt will benefit from continued skilled physical therapy services to address the aforementioned deficits.                     PT Education - 07/17/15 1703    Education provided Yes   Education Details ther-ex   [employer]) Educated Patient   Methods Demonstration;Tactile cues;Explanation;Verbal cues   Comprehension Returned demonstration;Verbalized understanding             PT Long Term Goals - 07/17/15 1913    PT LONG TERM GOAL #1   Title Patient will demonstrate improved TUG score of  1 min  to decrease fall risk   Time 6   Period Weeks   Status On-going   PT LONG TERM GOAL #2   Title Patient will be able to ambulate with rw at least 70 ft with CGA +1 (second person pushing wheel chair) without rest to promote ability to ambulate around the house.    Time 6   Period Weeks   Status On-going   PT LONG TERM GOAL #3   Title Patient will demonstrate 5x STS in 1 min 45 seconds for  improved strength and mobility at home.    Time 6   Period Weeks   Status On-going   PT LONG TERM GOAL #4  Title Patient will improve LEFS score to > 25/80 demonstrating improved function.   Time 6   Period Weeks   Status On-going               Plan - 08-05-15 1703    Clinical Impression Statement Pt demonstrates difficulty with turning around when trying to perform the TUG test, needing max cues to turn his rw in small increments secondary to tendency to turn his rw about 80 degrees, making it difficult for the rest of his body to catch up.  Pt also needed consistent cues to stay closer to his rw, pick up his feet to promote better foot clearance, and increase in step length during gait. Pt demonstrates days when he is able to ambulate longer distances, walking up to 91 ft 2 weeks ago. Pt however averages between 25 ft - 40 ft. Able to perform sit <> stand transfers with min to mod A on average, needing consistent cues for forward trunk placement to place his center of gravity over his base to support to help him stand. Pt still demonstrates LE weakness, decreased endurance with gait, difficulty with turning and performing transfers. Pt will benefit from continued skilled physical therapy services to address the aforementioned deficits.    Rehab Potential Fair   PT Frequency 2x / week   PT Duration 6 weeks   PT Treatment/Interventions ADLs/Self Care Home Management;Gait training;Therapeutic activities;Therapeutic exercise;Balance training;Wheelchair mobility training;Patient/family education;Passive range of motion   PT Next Visit Plan Strengthening, gait, stretching of LEs. balance   PT Home Exercise Plan Continue as prescribed   Consulted and Agree with Plan of Care Patient;Family member/caregiver   Family Member Consulted one of pt daughters      Patient will benefit from skilled therapeutic intervention in order to improve the following deficits and impairments:  Decreased  activity tolerance, Decreased balance, Impaired flexibility, Decreased mobility  Visit Diagnosis: Muscle weakness (generalized)  Difficulty in walking, not elsewhere classified       G-Codes - 08/05/2015 1914    Functional Assessment Tool Used clinical presentation, gait   Functional Limitation Mobility: Walking and moving around   Mobility: Walking and Moving Around Current Status [phone]) At least 60 percent but less than 80 percent impaired, limited or restricted   Mobility: Walking and Moving Around Goal Status [phone]) At least 40 percent but less than 60 percent impaired, limited or restricted      Problem List Patient Active Problem List   Diagnosis Date Noted  . Pneumonia 02/09/2015  . 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 Discoloration of skin of foot-Left 10/13/2013  . PAD (peripheral artery disease) (HCC)   . TIA (transient ischemic attack)   . Edema   . Hyperlipidemia   . Syncope   . CAD (coronary artery disease)   . Venous insufficiency   . Carotid artery disease (HCC)   . Elevated CPK   . Popliteal aneurysm (HCC)   . Speech problem   . Hx of CABG   . Ejection fraction   . Prominent abdominal aortic pulsation   . Intracranial hemorrhage (HCC)   . Bradycardia     Thank you for your referral.  Loralyn Freshwater PT, DPT   08/05/15, 7:20 PM  [facility] PHYSICAL AND SPORTS MEDICINE [address], [place], [address] Phone: [phone]   Fax:  [phone]  Name: [name] [name] MRN: [id number] Date of Birth: Oct 23, 1921

## 2015-07-17 NOTE — Assessment & Plan Note (Addendum)
Changes consistent with early pneumonia Will treat with azithromycin as his responded well to that in the past avoiding  quinolones for now due to concern about tendon rupture Discuss x-ray patient got x-ray within the last 6 months with no follow-up indicated will not get x-ray this time.

## 2015-07-19 ENCOUNTER — Ambulatory Visit: Payer: Medicare Other

## 2015-07-19 VITALS — HR 93

## 2015-07-19 DIAGNOSIS — R262 Difficulty in walking, not elsewhere classified: Secondary | ICD-10-CM

## 2015-07-19 DIAGNOSIS — M6281 Muscle weakness (generalized): Secondary | ICD-10-CM | POA: Diagnosis not present

## 2015-07-19 NOTE — Therapy (Signed)
[place] [facility] PHYSICAL AND SPORTS MEDICINE [address] [place], [address] Phone: [phone]   Fax:  [phone]  Physical Therapy Treatment  Patient Details  Name: [name] [name] MRN: [id number] Date of Birth: 05-26-1921 Referring Provider: Olevia Perches, DO  Encounter Date: 07/19/2015      PT End of Session - 07/19/15 1707    Visit Number 45   Number of Visits 59   Date for PT Re-Evaluation 07/27/15   Authorization Type 2   Authorization Time Period of 10   PT Start Time 1707   PT Stop Time 1741   PT Time Calculation (min) 34 min   Equipment Utilized During Treatment Gait belt;Other (comment)  RW   Activity Tolerance Patient tolerated treatment well   Behavior During Therapy [facility] for tasks assessed/performed      Past Medical History  Diagnosis Date  . CAD (coronary artery disease)     nuclear 01/2008, no ischemia, EF 60%, distal anterior scar  . Hyperlipidemia   . Syncope     .remote..no etiology  /  syncope 11/2008.. from acute leg pain  . Venous insufficiency     support hose  . Carotid artery disease (HCC)     doppler 05/2009,  0-39% bilateral / doppler 06/2010 OK  . Elevated CPK     11/23/08.Marland KitchenMarland Kitchenprobably from debris from popliteal aneurysm  . Popliteal aneurysm (HCC)     .repair..11/25/2008  Dr. Darrick Penna  . Speech problem     Neurologic event with brief infusion and speech difficulty.... January, 2011.... being assessed by neurology  February, 2011  . Hx of CABG     1998  . Ejection fraction     EF 45%, echo,05/2009,apical hypo,  ?? posterior and lateral hypo  . Prominent abdominal aortic pulsation     doppler normal  . Intracranial hemorrhage (HCC)     Two small subependymal bleeds 06/2010,,ASA held  . Bradycardia     06/2010  . Edema     Ankle edema August, 2012  . TIA (transient ischemic attack)     Brief episode of confusion and speech difficulty, January, 2012  /  recurrent confusion speech difficulty September,  2012  . PAD (peripheral artery disease) (HCC)     Above the knee to below the knee popliteal bypass,, right  //    Doppler,  July, 2013, patent  . Fall at home July 2015    Past Surgical History  Procedure Laterality Date  . Coronary artery bypass graft  1998  . Popliteal bypass  with reversed greater saphenous vein from right leg.      Filed Vitals:   07/19/15 1710  Pulse: 93  SpO2: 97%        Subjective Assessment - 07/19/15 1710    Subjective Pt state feeling fair to middle. R biceps is some better. Pt daughter states pt having respiratory problems (pre-pneumonia). Per pt daughter, his cardiologists do not think the pt has CHF. Pt gets checked regularly both with his cardiologists and PCP. Started antibiotics (Zpack this past Monday 07/17/15) for his pre pneumonia   Patient is accompained by: Family member  Daughter    Limitations Standing;Walking;House hold activities   How long can you walk comfortably? 2 min   Patient Stated Goals "To be able to walk again."   Currently in Pain? Yes   Pain Score --  No pain level provided       Objectives   Manual therapy  STM R  biceps  Decreased R biceps discomfort   There-ex  Sit <> stand from wc mod A multiple times.   Seated manually resisted leg press 10x3 each LE  Seated hip flexion 10x5 seconds then 6 x 5 seconds each LE  Rest breaks provided secondary to fatigue.     Improved exercise technique, movement at target joints, use of target muscles after mod verbal, visual, tactile cues.     Light session today secondary to pt fatigue. Pt demonstrates difficulty with performing transfers and decreased activity tolerance today.                           PT Education - 07/19/15 1853    Education provided Yes   Education Details ther-ex   Person(s) Educated Patient   Methods Explanation;Demonstration;Tactile cues;Verbal cues   Comprehension Verbalized understanding;Returned demonstration              PT Long Term Goals - 07/17/15 1913    PT LONG TERM GOAL #1   Title Patient will demonstrate improved TUG score of  1 min  to decrease fall risk   Time 6   Period Weeks   Status On-going   PT LONG TERM GOAL #2   Title Patient will be able to ambulate with rw at least 70 ft with CGA +1 (second person pushing wheel chair) without rest to promote ability to ambulate around the house.    Time 6   Period Weeks   Status On-going   PT LONG TERM GOAL #3   Title Patient will demonstrate 5x STS in 1 min 45 seconds for improved strength and mobility at home.    Time 6   Period Weeks   Status On-going   PT LONG TERM GOAL #4   Title Patient will improve LEFS score to > 25/80 demonstrating improved function.   Time 6   Period Weeks   Status On-going               Plan - 07/19/15 1855    Clinical Impression Statement Light session today secondary to pt fatigue. Pt demonstrates difficulty with performing transfers and decreased activity tolerance today.   Rehab Potential Fair   PT Frequency 2x / week   PT Duration 6 weeks   PT Treatment/Interventions ADLs/Self Care Home Management;Gait training;Therapeutic activities;Therapeutic exercise;Balance training;Wheelchair mobility training;Patient/family education;Passive range of motion   PT Next Visit Plan Strengthening, gait, stretching of LEs. balance   PT Home Exercise Plan Continue as prescribed   Consulted and Agree with Plan of Care Patient;Family member/caregiver   Family Member Consulted one of pt daughters      Patient will benefit from skilled therapeutic intervention in order to improve the following deficits and impairments:  Decreased activity tolerance, Decreased balance, Impaired flexibility, Decreased mobility  Visit Diagnosis: Muscle weakness (generalized)  Difficulty in walking, not elsewhere classified     Problem List Patient Active Problem List   Diagnosis Date Noted  . Pneumonia 02/09/2015   . 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 Discoloration of skin of foot-Left 10/13/2013  . PAD (peripheral artery disease) (HCC)   . TIA (transient ischemic attack)   . Edema   . Hyperlipidemia   . Syncope   . CAD (coronary artery disease)   . Venous insufficiency   . Carotid artery disease (HCC)   . Elevated CPK   . Popliteal aneurysm (HCC)   . Speech problem   . Hx of CABG   .  Ejection fraction   . Prominent abdominal aortic pulsation   . Intracranial hemorrhage (HCC)   . Bradycardia     Loralyn Freshwater PT, DPT   07/19/2015, 7:01 PM  [place] [facility] PHYSICAL AND SPORTS MEDICINE [address], [place], [address] Phone: [phone]   Fax:  [phone]  Name: [name] [name] MRN: [id number] Date of Birth: 1922-01-15

## 2015-07-25 ENCOUNTER — Ambulatory Visit: Payer: Medicare Other

## 2015-07-25 DIAGNOSIS — R262 Difficulty in walking, not elsewhere classified: Secondary | ICD-10-CM

## 2015-07-25 DIAGNOSIS — M6281 Muscle weakness (generalized): Secondary | ICD-10-CM | POA: Diagnosis not present

## 2015-07-25 NOTE — Therapy (Signed)
[place] [facility]ICINE [address] [place], [place], [address] Phone: [phone]   Fax:  [phone]  Physical Therapy Treatment  Patient Details  Name: [name] Palmerlbert L Crichlow MRN: [id number] Date of Birth: 01/06/1922 Referring Provider: Olevia PerchesMegan Johnson, DO  Encounter Date: 07/25/2015      PT End of Session - 07/25/15 1705    Visit Number 46   Number of Visits 59   Date for PT Re-Evaluation 07/27/15   Authorization Type 3   Authorization Time Period of 10   PT Start Time 1705   PT Stop Time 1749   PT Time Calculation (min) 44 min   Equipment Utilized During Treatment Gait belt;Other (comment)  RW   Activity Tolerance Patient tolerated treatment well   Behavior During Therapy [facility] for tasks assessed/performed      Past Medical History  Diagnosis Date  . CAD (coronary artery disease)     nuclear 01/2008, no ischemia, EF 60%, distal anterior scar  . Hyperlipidemia   . Syncope     .remote..no etiology  /  syncope 11/2008.. from acute leg pain  . Venous insufficiency     support hose  . Carotid artery disease (HCC)     doppler 05/2009,  0-39% bilateral / doppler 06/2010 OK  . Elevated CPK     11/23/08.Marland Kitchen.Marland Kitchen.probably from debris from popliteal aneurysm  . Popliteal aneurysm (HCC)     .repair..11/25/2008  Dr. Darrick PennaFields  . Speech problem     Neurologic event with brief infusion and speech difficulty.... January, 2011.... being assessed by neurology  February, 2011  . Hx of CABG     1998  . Ejection fraction     EF 45%, echo,05/2009,apical hypo,  ?? posterior and lateral hypo  . Prominent abdominal aortic pulsation     doppler normal  . Intracranial hemorrhage (HCC)     Two small subependymal bleeds 06/2010,,ASA held  . Bradycardia     06/2010  . Edema     Ankle edema August, 2012  . TIA (transient ischemic attack)     Brief episode of confusion and speech difficulty, January, 2012  /  recurrent confusion speech difficulty September,  2012  . PAD (peripheral artery disease) (HCC)     Above the knee to below the knee popliteal bypass,, right  //    Doppler,  July, 2013, patent  . Fall at home July 2015    Past Surgical History  Procedure Laterality Date  . Coronary artery bypass graft  1998  . Popliteal bypass  with reversed greater saphenous vein from right leg.      There were no vitals filed for this visit.      Subjective Assessment - 07/25/15 1706    Subjective Pt daughter states pt went to PCP yeterday and lungs are getting clear. Pt states doing good. R arm is not good. Reaching up to pull himself up in the car bothers his R arm. Pushing himself up bothers his arm. Walking with rw does not bother his R arm.    Patient is accompained by: Family member  Daughter    Limitations Standing;Walking;House hold activities   How long can you walk comfortably? 2 min   Patient Stated Goals "To be able to walk again."   Currently in Pain? Yes   Pain Score 7   R arm         Objectives   Manual therapy  STM R biceps Pt states R arm feels  better afterwards   There-ex  SpO2: 93-94%  Sit <> stand from wc mod A 1x  Pt states difficulty due to R lateral ankle discomfort (bilateral ankles and feet chronically swollen)   Then with pillow between knees to decrease genu valgus 1x mod A   With ball between knees to decrease genu valgus 1x mod A   Then with towel supporting R foot arch (to decrease lateral ankle pressure)   No change in ankle pain with sit <> stand  Then sit <> stand throughout session normally with mod A multiple times  Seated R ankle heel slides to promote ROM 10x3 R LE (reviewed and given as part of HEP 30 x 3 daily, pt verbalized understanding)   SpO2 96%, HR 78 bpm   Gait 13 ft with min to mod A +1 (second person pushing wc), slight L knee bucking secondary to L ankle pain per pt.   Seated manually resisted leg press 10x2 each LE  Improved exercise technique, movement at  target joints, use of target muscles after mod to max verbal, visual, tactile cues.     Pt difficulty with sit <> stand transfers secondary to R ankle and R arm pain and with gait secondary to L ankle pain.  Rainy weather, bilateral foot pronation and chronic ankle swelling may play a factor during today's discomfort. No change in ankle symptoms with modifications with sit <> stand transfers. Decreased R arm discomfort/pain after soft tissue mobilization to R biceps.             PT Education - 07/25/15 1900    Education provided Yes   Education Details ther-ex, HEP   Person(s) Educated Patient   Methods Explanation;Demonstration;Verbal cues;Tactile cues   Comprehension Verbalized understanding;Returned demonstration             PT Long Term Goals - 07/17/15 1913    PT LONG TERM GOAL #1   Title Patient will demonstrate improved TUG score of  1 min  to decrease fall risk   Time 6   Period Weeks   Status On-going   PT LONG TERM GOAL #2   Title Patient will be able to ambulate with rw at least 70 ft with CGA +1 (second person pushing wheel chair) without rest to promote ability to ambulate around the house.    Time 6   Period Weeks   Status On-going   PT LONG TERM GOAL #3   Title Patient will demonstrate 5x STS in 1 min 45 seconds for improved strength and mobility at home.    Time 6   Period Weeks   Status On-going   PT LONG TERM GOAL #4   Title Patient will improve LEFS score to > 25/80 demonstrating improved function.   Time 6   Period Weeks   Status On-going               Plan - 07/25/15 1901    Clinical Impression Statement Pt difficulty with sit <> stand transfers secondary to R ankle and R arm pain and with gait secondary to L ankle pain.  Rainy weather, bilateral foot pronation and chronic ankle swelling may play a factor during today's discomfort. No change in ankle symptoms with modifications with sit <> stand transfers. Decreased R arm discomfort/pain  after soft tissue mobilization to R biceps.    Rehab Potential Fair   PT Frequency 2x / week   PT Duration 6 weeks   PT Treatment/Interventions ADLs/Self Care Home Management;Gait training;Therapeutic  activities;Therapeutic exercise;Balance training;Wheelchair mobility training;Patient/family education;Passive range of motion   PT Next Visit Plan Strengthening, gait, stretching of LEs. balance   PT Home Exercise Plan Continue as prescribed   Consulted and Agree with Plan of Care Patient;Family member/caregiver   Family Member Consulted one of pt daughters      Patient will benefit from skilled therapeutic intervention in order to improve the following deficits and impairments:  Decreased activity tolerance, Decreased balance, Impaired flexibility, Decreased mobility  Visit Diagnosis: Muscle weakness (generalized)  Difficulty in walking, not elsewhere classified     Problem List Patient Active Problem List   Diagnosis Date Noted  . Pneumonia 02/09/2015  . 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 Discoloration of skin of foot-Left 10/13/2013  . PAD (peripheral artery disease) (HCC)   . TIA (transient ischemic attack)   . Edema   . Hyperlipidemia   . Syncope   . CAD (coronary artery disease)   . Venous insufficiency   . Carotid artery disease (HCC)   . Elevated CPK   . Popliteal aneurysm (HCC)   . Speech problem   . Hx of CABG   . Ejection fraction   . Prominent abdominal aortic pulsation   . Intracranial hemorrhage (HCC)   . Bradycardia     Loralyn Freshwater PT, DPT   07/25/2015, 7:05 PM  [place] [facility] PHYSICAL AND SPORTS MEDICINE [address], [place], [address] Phone: [phone]   Fax:  [phone]  Name: JORGELUIS GURGANUS MRN: [id number] Date of Birth: 12-Jan-1922

## 2015-07-25 NOTE — Patient Instructions (Signed)
Patient was recommended to perform seated heel slides 30x3 daily to promote ankle ROM. Pt demonstrated and verbalized understanding.

## 2015-07-27 ENCOUNTER — Ambulatory Visit: Payer: Medicare Other

## 2015-07-27 DIAGNOSIS — M6281 Muscle weakness (generalized): Secondary | ICD-10-CM | POA: Diagnosis not present

## 2015-07-27 DIAGNOSIS — R262 Difficulty in walking, not elsewhere classified: Secondary | ICD-10-CM

## 2015-07-27 NOTE — Patient Instructions (Signed)
Gave manually resisted R biceps flexion isometrics (at 90 degrees flexion) 10x3 with 5 second holds daily at a pain free level of effort as part of his home exercise program to help decrease biceps pain. Pt daughter verbalized understanding.

## 2015-07-27 NOTE — Therapy (Signed)
[facility] [address], [place], [address] Phone: [phone]   Fax:  [phone]  Physical Therapy Treatment And Progress Report  Patient Details  Name: [name] [name] MRN: [id number] Date of Birth: 01/09/1922 Referring Provider: Olevia Perches, DO  Encounter Date: 07/27/2015      PT End of Session - 07/27/15 1705    Visit Number 47   Number of Visits 71   Date for PT Re-Evaluation 09/07/15   Authorization Type 4   Authorization Time Period of 10   PT Start Time 1705   PT Stop Time 1747   PT Time Calculation (min) 42 min   Equipment Utilized During Treatment Gait belt;Other (comment)  RW   Activity Tolerance Patient tolerated treatment well   Behavior During Therapy [facility] for tasks assessed/performed      Past Medical History  Diagnosis Date  . CAD (coronary artery disease)     nuclear 01/2008, no ischemia, EF 60%, distal anterior scar  . Hyperlipidemia   . Syncope     .remote..no etiology  /  syncope 11/2008.. from acute leg pain  . Venous insufficiency     support hose  . Carotid artery disease (HCC)     doppler 05/2009,  0-39% bilateral / doppler 06/2010 OK  . Elevated CPK     11/23/08.Marland KitchenMarland Kitchenprobably from debris from popliteal aneurysm  . Popliteal aneurysm (HCC)     .repair..11/25/2008  Dr. Darrick Penna  . Speech problem     Neurologic event with brief infusion and speech difficulty.... January, 2011.... being assessed by neurology  February, 2011  . Hx of CABG     1998  . Ejection fraction     EF 45%, echo,05/2009,apical hypo,  ?? posterior and lateral hypo  . Prominent abdominal aortic pulsation     doppler normal  . Intracranial hemorrhage (HCC)     Two small subependymal bleeds 06/2010,,ASA held  . Bradycardia     06/2010  . Edema     Ankle edema August, 2012  . TIA (transient ischemic attack)     Brief episode of confusion and speech difficulty, January, 2012  /  recurrent confusion speech  difficulty September, 2012  . PAD (peripheral artery disease) (HCC)     Above the knee to below the knee popliteal bypass,, right  //    Doppler,  July, 2013, patent  . Fall at home July 2015    Past Surgical History  Procedure Laterality Date  . Coronary artery bypass graft  1998  . Popliteal bypass  with reversed greater saphenous vein from right leg.      Filed Vitals:   07/27/15 1707  SpO2: 96%        Subjective Assessment - 07/27/15 1708    Subjective R arm is much better but it still has pain. 6/10 R arm pain currently. The ankles are better also. Once in a while they get sore.    Patient is accompained by: Family member  Daughter    Limitations Standing;Walking;House hold activities   How long can you walk comfortably? 2 min   Patient Stated Goals "To be able to walk again."            [facility] PT Assessment - 07/27/15 1729    Observation/Other Assessments   Observations Five times STS 1 min 31 seconds with min to mod A   Lower Extremity Functional Scale  15/80  Objectives  There-ex  Directed patient with gait 63 ft (95% SpO2), 63 ft (97% SpO2, 91 BPM) cues for increasing hip and knee flexion, step length (R LE > L LE), staying closer to his rw  Sit <> stand from wc 2x with SBA  Then 5x times with min to mod A, cues for leaning forward more to place and maintain his center of gravity over his base of support (feet). Some R biceps discomfort.   Reviewed progress/current status with physical therapy with pt and daughter with Five Times STS, TUG, gait  Manually resisted R biceps flexion isometrics (at 90 degrees flexion) 10x2 with 5 seconds   (pt daughter education to help her perform exercise with pt at home at pain free level of effort 10x3 with 5 second holds daily; daughter demonsrated and verbalized understanding)  Seated manually resisted leg press 10x2 each LE  Vitals monitored throughout session.   Rest breaks provided as needed  to allow patient time to recover from one exercise to the next.    Improved exercise technique, movement at target joints, use of target muscles after mod to max verbal, visual, tactile cues.     Five times sit to stand (96 % SpO2, HR 93 bpm) 1 min, 31 seconds with min to mod A   Patient has demonstrated some progress, though inconsistent, since last progress report on 06/13/2015 with gait distance improving to over 40 ft multiple times with one day being able to ambulate up to 91 ft. Currently able to ambulate 63 ft 2x today. Had difficulty ambulating similar distances during previous sessions probably due to fatigue as well as ankle and R biceps pain. No complain of ankle pain today and patient states that his R biceps symptoms are improving. Pt needs consistent mod to max verbal cues to increase hip and knee flexion, step length, proper rw placement when ambulating. Pt also able to perform the Five Times Sit to Stand faster today compared to his last progress report on 06/13/15, only being able to perform task 3x at that time with min to mod A (1 min and 51 seconds). Able to perform transfer 5 times today for the time but with min to mod A (1 min and 31 seconds). Still demonstrates some difficulty with transfers in which fatigue or R biceps pain may play a factor. Able to perform sit <> stand transfer today with improved mechanics 2x with SBA. Pt needed min to mod A to perform the transfer the rest of the session. Pt demonstrated difficulty with the TUG test measured on 07/17/15, not being able to finish the test to turn and sit down probably due to fatigue and difficulty managing his rw when making turns. Overall, patient has made some improvement with gait distance since his progress report on 05/09/15. Limiting factors include age, bilateral LE edema, weakness, decreased endurance and activity tolerance, bilateral ankle pain, knee discomfort at times, R biceps pain, fatigue. Patient will benefit from  continued skilled physical therapy services to improve ability to perform transfers, improve LE strength, ability to ambulate, maintain function, and prevent functional decline.               PT Education - 07/27/15 1919    Education provided Yes   Education Details ther-ex, HEP, plan of care   Person(s) Educated Patient   Methods Explanation;Demonstration;Tactile cues;Verbal cues   Comprehension Verbalized understanding;Returned demonstration             PT Long Term Goals -  07/27/15 1920    PT LONG TERM GOAL #1   Title Patient will demonstrate improved TUG score of  1 min  to decrease fall risk   Time 6   Period Weeks   Status On-going   PT LONG TERM GOAL #2   Title Patient will be able to ambulate with rw at least 70 ft with CGA +1 (second person pushing wheel chair) without rest to promote ability to ambulate around the house.    Time 6   Period Weeks   Status On-going   PT LONG TERM GOAL #3   Title Patient will demonstrate 5x STS in 1 min 45 seconds for improved strength and mobility at home. CGA   Time 6   Period Weeks   Status Revised   PT LONG TERM GOAL #4   Title Patient will improve LEFS score to > 25/80 demonstrating improved function.   Time 6   Period Weeks   Status On-going   PT LONG TERM GOAL #5   Title Patient will have a decrease in R biceps pain to 4/10 or less on average to promote ability to perform transfers and ambulate using a rw.   Baseline 6/10 average   Time 6   Period Weeks   Status New               Plan - 07/27/15 1911    Clinical Impression Statement Patient has demonstrated some progress, though inconsistent, since last progress report on 06/13/2015 with gait distance improving to over 40 ft multiple times with one day being able to ambulate up to 91 ft. Currently able to ambulate 63 ft 2x today. Had difficulty ambulating similar distances during previous sessions probably due to fatigue as well as ankle and R biceps pain.  No complain of ankle pain today and patient states that his R biceps symptoms are improving. Pt needs consistent mod to max verbal cues to increase hip and knee flexion, step length, proper rw placement when ambulating. Pt also able to perform the Five Times Sit to Stand faster today compared to his last progress report on 06/13/15, only being able to perform task 3x at that time with min to mod A (1 min and 51 seconds). Able to perform transfer 5 times today for the time but with min to mod A (1 min and 31 seconds). Still demonstrates some difficulty with transfers in which fatigue or R biceps pain may play a factor. Able to perform sit <> stand transfer today with improved mechanics 2x with SBA. Pt needed min to mod A to perform the transfer the rest of the session. Pt demonstrated difficulty with the TUG test measured on 07/17/15, not being able to finish the test to turn and sit down probably due to fatigue and difficulty managing his rw when making turns. Overall, patient has made some improvement with gait distance since his progress report on 05/09/15. Limiting factors include age, bilateral LE edema, weakness, decreased endurance and activity tolerance, bilateral ankle pain, knee discomfort at times, R biceps pain, fatigue. Patient will benefit from continued skilled physical therapy services to improve ability to perform transfers, improve LE strength, ability to ambulate, maintain function, and prevent functional decline.    Rehab Potential Fair   Clinical Impairments Affecting Rehab Potential age, bilateral LE edema, weakness, decreased endurance and activity tolerance, bilateral ankle pain, knee discomfort at times, R biceps pain, fatigue   PT Frequency 2x / week   PT Duration 6 weeks  PT Treatment/Interventions ADLs/Self Care Home Management;Gait training;Therapeutic activities;Therapeutic exercise;Balance training;Wheelchair mobility training;Patient/family education;Passive range of motion   PT  Next Visit Plan Strengthening, gait, stretching of LEs. balance   PT Home Exercise Plan Continue as prescribed   Consulted and Agree with Plan of Care Patient;Family member/caregiver   Family Member Consulted one of pt daughters      Patient will benefit from skilled therapeutic intervention in order to improve the following deficits and impairments:  Decreased activity tolerance, Decreased balance, Impaired flexibility, Decreased mobility  Visit Diagnosis: Difficulty in walking, not elsewhere classified - Plan: PT plan of care cert/re-cert  Muscle weakness (generalized) - Plan: PT plan of care cert/re-cert     Problem List Patient Active Problem List   Diagnosis Date Noted  . Pneumonia 02/09/2015  . 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 Discoloration of skin of foot-Left 10/13/2013  . PAD (peripheral artery disease) (HCC)   . TIA (transient ischemic attack)   . Edema   . Hyperlipidemia   . Syncope   . CAD (coronary artery disease)   . Venous insufficiency   . Carotid artery disease (HCC)   . Elevated CPK   . Popliteal aneurysm (HCC)   . Speech problem   . Hx of CABG   . Ejection fraction   . Prominent abdominal aortic pulsation   . Intracranial hemorrhage (HCC)   . Bradycardia      Thank you for your referral.   Loralyn Freshwater PT, DPT  07/27/2015, 7:27 PM  [facility] PH[facility] SPORTS MEDICINE [address], [place], [address] Phone: [phone]   Fax:  [phone]  Name: [name] [name] MRN: [id number] Date of Birth: June 20, 1921

## 2015-08-03 ENCOUNTER — Emergency Department: Payer: Medicare Other

## 2015-08-03 ENCOUNTER — Ambulatory Visit: Payer: Medicare Other | Admitting: Family Medicine

## 2015-08-03 ENCOUNTER — Encounter: Payer: Self-pay | Admitting: Intensive Care

## 2015-08-03 ENCOUNTER — Emergency Department
Admission: EM | Admit: 2015-08-03 | Discharge: 2015-08-03 | Disposition: A | Discharge status: Home / Self Care | Payer: Medicare Other | Attending: Internal Medicine | Admitting: Internal Medicine

## 2015-08-03 DIAGNOSIS — G459 Transient cerebral ischemic attack, unspecified: Secondary | ICD-10-CM | POA: Insufficient documentation

## 2015-08-03 DIAGNOSIS — R531 Weakness: Secondary | ICD-10-CM | POA: Diagnosis present

## 2015-08-03 DIAGNOSIS — Z79899 Other long term (current) drug therapy: Secondary | ICD-10-CM | POA: Insufficient documentation

## 2015-08-03 DIAGNOSIS — Z951 Presence of aortocoronary bypass graft: Secondary | ICD-10-CM | POA: Diagnosis not present

## 2015-08-03 DIAGNOSIS — Z87891 Personal history of nicotine dependence: Secondary | ICD-10-CM | POA: Diagnosis not present

## 2015-08-03 DIAGNOSIS — E785 Hyperlipidemia, unspecified: Secondary | ICD-10-CM | POA: Insufficient documentation

## 2015-08-03 DIAGNOSIS — R55 Syncope and collapse: Secondary | ICD-10-CM

## 2015-08-03 LAB — COMPREHENSIVE METABOLIC PANEL
ALBUMIN: 3.5 g/dL (ref 3.5–5.0)
ALK PHOS: 59 U/L (ref 38–126)
ALT: 14 U/L — ABNORMAL LOW (ref 17–63)
ANION GAP: 7 (ref 5–15)
AST: 26 U/L (ref 15–41)
BILIRUBIN TOTAL: 0.4 mg/dL (ref 0.3–1.2)
BUN: 15 mg/dL (ref 6–20)
CALCIUM: 9.1 mg/dL (ref 8.9–10.3)
CO2: 28 mmol/L (ref 22–32)
CREATININE: 0.66 mg/dL (ref 0.61–1.24)
Chloride: 99 mmol/L — ABNORMAL LOW (ref 101–111)
GFR calc non Af Amer: >60 mL/min (ref >60)
GLUCOSE: 110 mg/dL — AB (ref 65–99)
Potassium: 4.3 mmol/L (ref 3.5–5.1)
Sodium: 134 mmol/L — ABNORMAL LOW (ref 135–145)
TOTAL PROTEIN: 6.9 g/dL (ref 6.5–8.1)

## 2015-08-03 LAB — CBC WITH DIFFERENTIAL/PLATELET
BASOS PCT: 1 %
Basophils Absolute: 0 10*3/uL (ref 0–0.1)
EOS ABS: 0.2 10*3/uL (ref 0–0.7)
EOS PCT: 3 %
HCT: 40.4 % (ref 40.0–52.0)
HEMOGLOBIN: 13.3 g/dL (ref 13.0–18.0)
Lymphocytes Relative: 19 %
Lymphs Abs: 1.3 10*3/uL (ref 1.0–3.6)
MCH: 30.6 pg (ref 26.0–34.0)
MCHC: 32.9 g/dL (ref 32.0–36.0)
MCV: 92.8 fL (ref 80.0–100.0)
MONOS PCT: 6 %
Monocytes Absolute: 0.4 10*3/uL (ref 0.2–1.0)
NEUTROS PCT: 71 %
Neutro Abs: 4.9 10*3/uL (ref 1.4–6.5)
PLATELETS: 165 10*3/uL (ref 150–440)
RBC: 4.35 MIL/uL — ABNORMAL LOW (ref 4.40–5.90)
RDW: 14.3 % (ref 11.5–14.5)
WBC: 6.9 10*3/uL (ref 3.8–10.6)

## 2015-08-03 LAB — URINALYSIS COMPLETE WITH MICROSCOPIC (ARMC ONLY)
BILIRUBIN URINE: NEGATIVE
Bacteria, UA: NONE SEEN
GLUCOSE, UA: NEGATIVE mg/dL
HGB URINE DIPSTICK: NEGATIVE
KETONES UR: NEGATIVE mg/dL
Leukocytes, UA: NEGATIVE
NITRITE: NEGATIVE
Protein, ur: NEGATIVE mg/dL
SPECIFIC GRAVITY, URINE: 1.008 (ref 1.005–1.030)
pH: 7 (ref 5.0–8.0)

## 2015-08-03 LAB — TROPONIN I: Troponin I: <0.03 ng/mL (ref <0.031)

## 2015-08-03 LAB — BRAIN NATRIURETIC PEPTIDE: B Natriuretic Peptide: 154 pg/mL — ABNORMAL HIGH (ref 0.0–100.0)

## 2015-08-03 MED ORDER — AZITHROMYCIN 500 MG PO TABS
500.0000 mg | ORAL_TABLET | Freq: Every day | ORAL | Status: AC
  Administered 2015-08-03: 500 mg via ORAL

## 2015-08-03 MED ORDER — AZITHROMYCIN 500 MG PO TABS
ORAL_TABLET | ORAL | Status: AC
  Administered 2015-08-03: 500 mg via ORAL
  Filled 2015-08-03: qty 1

## 2015-08-03 MED ORDER — AZITHROMYCIN 250 MG PO TABS: 250.0000 mg | ORAL_TABLET | Freq: Every day | ORAL | Status: DC

## 2015-08-03 MED ORDER — AZITHROMYCIN 250 MG PO TABS: ORAL_TABLET | ORAL | Status: DC

## 2015-08-03 NOTE — Consult Note (Signed)
[employer] - [place] [facility]   PATIENT NAME: [name] [name]    MR#:  [id number]  DATE OF BIRTH:  02-15-1922  DATE OF ADMISSION:  08/03/2015  PRIMARY CARE PHYSICIAN: [name], MD   REQUESTING/REFERRING PHYSICIAN: Dr Dorothea Glassman  CHIEF COMPLAINT:   Chief Complaint  Patient presents with  . Weakness    HISTORY OF PRESENT ILLNESS:  [name] [name]  is a 82 y.o. male with a known history of Patient not sure what brought him in. The daughter states that he has been lethargic just looking over and weak. He has a history of this right arm muscle problem. He is not getting up and down well. He's been coughing up yellow mucus. The ER physician was concerned about this slumping over whether it could be a stroke. The patient does not complain of any weakness one side versus the other. In the ER, he is coughing but chest x-ray read as negative. Patient and family do not want to come into the hospital.  PAST MEDICAL HISTORY:   Past Medical History  Diagnosis Date  . CAD (coronary artery disease)     nuclear 01/2008, no ischemia, EF 60%, distal anterior scar  . Hyperlipidemia   . Syncope     .remote..no etiology  /  syncope 11/2008.. from acute leg pain  . Venous insufficiency     support hose  . Carotid artery disease (HCC)     doppler 05/2009,  0-39% bilateral / doppler 06/2010 OK  . Elevated CPK     11/23/08.Marland KitchenMarland Kitchenprobably from debris from popliteal aneurysm  . Popliteal aneurysm (HCC)     .repair..11/25/2008  Dr. Darrick Penna  . Speech problem     Neurologic event with brief infusion and speech difficulty.... January, 2011.... being assessed by neurology  February, 2011  . Hx of CABG     1998  . Ejection fraction     EF 45%, echo,05/2009,apical hypo,  ?? posterior and lateral hypo  . Prominent abdominal aortic pulsation     doppler normal  . Intracranial hemorrhage (HCC)     Two small subependymal bleeds 06/2010,,ASA held  . Bradycardia     06/2010  . Edema     Ankle  edema August, 2012  . TIA (transient ischemic attack)     Brief episode of confusion and speech difficulty, January, 2012  /  recurrent confusion speech difficulty September, 2012  . PAD (peripheral artery disease) (HCC)     Above the knee to below the knee popliteal bypass,, right  //    Doppler,  July, 2013, patent  . Fall at home July 2015    PAST SURGICAL HISTOIRY:   Past Surgical History  Procedure Laterality Date  . Coronary artery bypass graft  1998  . Popliteal bypass  with reversed greater saphenous vein from right leg.      SOCIAL HISTORY:   Social History  Substance Use Topics  . Smoking status: Former Smoker -- 20 years    Types: Cigarettes    Quit date: 09/12/1946  . Smokeless tobacco: Never Used     Comment: quit appox 50 yrs ago  . Alcohol Use: No    FAMILY HISTORY:   Family History  Problem Relation Age of Onset  . Diabetes Sister   . Stroke Mother   . Diabetes Mother     DRUG ALLERGIES:   Allergies  Allergen Reactions  . Penicillins Hives  . Latex Rash  . Sulfonamide Derivatives Rash  REVIEW OF SYSTEMS:  CONSTITUTIONAL: No fever, fatigue or weakness.  EYES: No blurred or double vision. Wears glasses and has cataract EARS, NOSE, AND THROAT: No tinnitus or ear pain. Decreased hearing. Some runny nose RESPIRATORY: Positive for cough, no shortness of breath, occasional wheezing, no hemoptysis.  CARDIOVASCULAR: No chest pain, orthopnea, edema.  GASTROINTESTINAL: No nausea, vomiting, diarrhea or abdominal pain.  GENITOURINARY: No dysuria, hematuria.  ENDOCRINE: No polyuria, nocturia,  HEMATOLOGY: No anemia, easy bruising or bleeding SKIN: No rash or lesion. MUSCULOSKELETAL: Some joint pain.   NEUROLOGIC: No tingling, numbness, weakness.  PSYCHIATRY: No anxiety or depression.   MEDICATIONS AT HOME:   Prior to Admission medications   Medication Sig Start Date End Date Taking? Authorizing Provider  aspirin 81 MG tablet Take 81 mg by mouth  daily.      Historical Provider, MD  azithromycin (ZITHROMAX) 250 MG tablet 2 now then 1 a day 07/17/15   Steele Sizer, MD  azithromycin (ZITHROMAX) 250 MG tablet One tab daily for 4 days 08/04/15   Alford Highland, MD  Calcium Carbonate (CALCIUM 500 PO) 2 tabs po qd     Historical Provider, MD  docusate (COLACE) 50 MG/5ML liquid Take by mouth.    Historical Provider, MD  Loratadine (CLARITIN REDITABS) 5 MG TBDP Take by mouth 2 (two) times daily as needed.    Historical Provider, MD  Multiple Vitamin (MULTIVITAMIN) capsule Take 1 capsule by mouth daily.      Historical Provider, MD      VITAL SIGNS:  Blood pressure 134/74, pulse 80, resp. rate 27, weight 95.255 kg (210 lb), SpO2 97 %.  Respirations was not 27 when I saw him. More like 16.  PHYSICAL EXAMINATION:  GENERAL:  82 y.o.-year-old patient lying in the bed with no acute distress.  EYES: Pupils equal, round, reactive to light and accommodation. No scleral icterus. Extraocular muscles intact.  HEENT: Head atraumatic, normocephalic. Oropharynx and nasopharynx clear.  NECK:  Supple, no jugular venous distention. No thyroid enlargement, no tenderness.  LUNGS: Normal breath sounds bilaterally, no wheezing, rales,rhonchi or crepitation. No use of accessory muscles of respiration.  CARDIOVASCULAR: S1, S2 normal. No murmurs, rubs, or gallops.  ABDOMEN: Soft, nontender, nondistended. Bowel sounds present. No organomegaly or mass.  EXTREMITIES: 3+ pedal edema, no cyanosis, or clubbing.  NEUROLOGIC: Cranial nerves II through XII are intact. Muscle strength 5/5 in all extremities. Sensation intact. Gait not checked. Patient able to straight leg raise. PSYCHIATRIC: The patient is alert and oriented x 3.  SKIN: No obvious rash, lesion, or ulcer.   LABORATORY PANEL:   CBC  Recent Labs Lab 08/03/15 1125  WBC 6.9  HGB 13.3  HCT 40.4  PLT 165    ------------------------------------------------------------------------------------------------------------------  Chemistries   Recent Labs Lab 08/03/15 1125  NA 134*  K 4.3  CL 99*  CO2 28  GLUCOSE 110*  BUN 15  CREATININE 0.66  CALCIUM 9.1  AST 26  ALT 14*  ALKPHOS 59  BILITOT 0.4   ------------------------------------------------------------------------------------------------------------------  Cardiac Enzymes  Recent Labs Lab 08/03/15 1125  TROPONINI <0.03   ------------------------------------------------------------------------------------------------------------------  RADIOLOGY:  Dg Chest 2 View  08/03/2015  CLINICAL DATA:  Cough for 3 days EXAM: CHEST  2 VIEW COMPARISON:  May 12, 2013 FINDINGS: There is no edema or consolidation. Heart size is upper normal with pulmonary vascularity within normal limits. No adenopathy. Patient is status post coronary artery bypass grafting. 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 Stomach appears somewhat distended  with fluid and air. There is degenerative change in both shoulders. IMPRESSION: Stomach distended with fluid and air.  No edema or consolidation. Electronically Signed   By: Bretta BangWilliam  Woodruff III M.D.   On: 08/03/2015 12:17   Ct Head Wo Contrast  08/03/2015  CLINICAL DATA:  Unusual weakness. EXAM: CT HEAD WITHOUT CONTRAST TECHNIQUE: Contiguous axial images were obtained from the base of the skull through the vertex without intravenous contrast. COMPARISON:  02/12/2011 FINDINGS: Brain: No evidence of acute infarction, hemorrhage, extra-axial collection, or mass effect. There is stable ventriculomegaly. There is atrophy and chronic small vessel disease changes. Vascular: Vascular calcifications are noted at the skullbase. Skull: Negative for fracture or focal lesion. Sinuses/Orbits: No acute findings. Other: None. IMPRESSION: Stable ventriculomegaly, somewhat out of proportion to the degree  of brain parenchymal atrophy. No acute intracranial abnormality. Chronic microvascular disease. Electronically Signed   By: Ted Mcalpineobrinka  Dimitrova M.D.   On: 08/03/2015 12:50     IMPRESSION AND PLAN:   1. Acute bronchitis with coughing. We'll give a course of Zithromax. Patient and family do not want to come into the hospital. 2. Episode of slumping over. Could be from cough. Doubt stroke. Again patient and family do not want to come into the hospital. 3. Lower extremity edema can consider Lasix as outpatient with PMD follow-up in one week especially if cough continues despite treatment 4. History of CAD on aspirin 5. Deconditioning 6. History of peripheral vascular disease    All the records are reviewed and case discussed with Consulting provider. Management plans discussed with the patient, family and they are in agreement.  CODE STATUS: Full code  TOTAL TIME TAKING CARE OF THIS PATIENT: 60 minutes.    Alford HighlandWIETING, Yvonda Fouty M.D on 08/03/2015 at 1:56 PM  Between 7am to 6pm - Pager - [phone]  After 6pm go to [email or web address] - password [email or web address]  [employer]  Office  [phone]  CC: Primary care Physician: Vonita MossMark Crissman, MD

## 2015-08-03 NOTE — ED Notes (Signed)
Patient arrived by EMS from home. Per daughter patient has had more weakness than normal since this morning and states he was "slumping" so they wanted him to be checked out. Per daughter patient has also had a cough for a few days. Patient is A&O X4. Patient denies any pain

## 2015-08-03 NOTE — ED Provider Notes (Signed)
[facility] Emergency Department Provider Note   ____________________________________________  Time seen: Approximately 11:53 AM  I have reviewed the triage vital signs and the nursing notes.   HISTORY  Chief Complaint Weakness    HPI [name] [name] is a 82 y.o. male who was with caregivers today at the breakfast table got weak and slumped first to the right side and then to the left side seemed to be lethargic little bit of slurry speech. Patient has had some coughing earlier and has had diagnosis of pneumonia couple times last year. Patient gets like this when he has a pneumonia. Episode lasted possibly 15 minutes or less today. It is completely resolved now patient feels fine. She denies any fever chills nausea vomiting headache shortness of breath chest pain etc.  Past Medical History  Diagnosis Date  . CAD (coronary artery disease)     nuclear 01/2008, no ischemia, EF 60%, distal anterior scar  . Hyperlipidemia   . Syncope     .remote..no etiology  /  syncope 11/2008.. from acute leg pain  . Venous insufficiency     support hose  . Carotid artery disease (HCC)     doppler 05/2009,  0-39% bilateral / doppler 06/2010 OK  . Elevated CPK     11/23/08.Marland KitchenMarland Kitchenprobably from debris from popliteal aneurysm  . Popliteal aneurysm (HCC)     .repair..11/25/2008  Dr. Darrick Penna  . Speech problem     Neurologic event with brief infusion and speech difficulty.... January, 2011.... being assessed by neurology  February, 2011  . Hx of CABG     1998  . Ejection fraction     EF 45%, echo,05/2009,apical hypo,  ?? posterior and lateral hypo  . Prominent abdominal aortic pulsation     doppler normal  . Intracranial hemorrhage (HCC)     Two small subependymal bleeds 06/2010,,ASA held  . Bradycardia     06/2010  . Edema     Ankle edema August, 2012  . TIA (transient ischemic attack)     Brief episode of confusion and speech difficulty, January, 2012  /  recurrent confusion  speech difficulty September, 2012  . PAD (peripheral artery disease) (HCC)     Above the knee to below the knee popliteal bypass,, right  //    Doppler,  July, 2013, patent  . Fall at home July 2015    Patient Active Problem List   Diagnosis Date Noted  . Pneumonia 02/09/2015  . 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 Discoloration of skin of foot-Left 10/13/2013  . PAD (peripheral artery disease) (HCC)   . TIA (transient ischemic attack)   . Edema   . Hyperlipidemia   . Syncope   . CAD (coronary artery disease)   . Venous insufficiency   . Carotid artery disease (HCC)   . Elevated CPK   . Popliteal aneurysm (HCC)   . Speech problem   . Hx of CABG   . Ejection fraction   . Prominent abdominal aortic pulsation   . Intracranial hemorrhage (HCC)   . Bradycardia     Past Surgical History  Procedure Laterality Date  . Coronary artery bypass graft  1998  . Popliteal bypass  with reversed greater saphenous vein from right leg.      Current Outpatient Rx  Name  Route  Sig  Dispense  Refill  . aspirin EC 81 MG tablet   Oral   Take 81 mg by mouth daily.         Marland Kitchen  calcium carbonate (TUMS - DOSED IN MG ELEMENTAL CALCIUM) 500 MG chewable tablet   Oral   Chew 2 tablets by mouth daily.         Marland Kitchen docusate sodium (COLACE) 100 MG capsule   Oral   Take 100 mg by mouth 2 (two) times daily as needed for mild constipation.         . Loratadine (CLARITIN REDITABS) 5 MG TBDP   Oral   Take by mouth 2 (two) times daily as needed.         . Multiple Vitamin (MULTIVITAMIN WITH MINERALS) TABS tablet   Oral   Take 1 tablet by mouth daily.         Marland Kitchen azithromycin (ZITHROMAX) 250 MG tablet      One tab daily for 4 days   4 each   0     Allergies Penicillins; Latex; and Sulfonamide derivatives  Family History  Problem Relation Age of Onset  . Diabetes Sister   . Stroke Mother   . Diabetes Mother     Social History Social History  Substance Use Topics  .  Smoking status: Former Smoker -- 20 years    Types: Cigarettes    Quit date: 09/12/1946  . Smokeless tobacco: Never Used     Comment: quit appox 50 yrs ago  . Alcohol Use: No    Review of Systems Constitutional: No fever/chills Eyes: No visual changes. ENT: No sore throat. Cardiovascular: Denies chest pain. Respiratory: Denies shortness of breath. Gastrointestinal: No abdominal pain.  No nausea, no vomiting.  No diarrhea.  No constipation. Genitourinary: Negative for dysuria. Musculoskeletal: Negative for back pain. Skin: Negative for rash. Neurological: Negative for headaches, focal weakness or numbness.  10-point ROS otherwise negative.  ____________________________________________   PHYSICAL EXAM:  VITAL SIGNS: ED Triage Vitals  Enc Vitals Group     BP --      Pulse --      Resp --      Temp --      Temp src --      SpO2 --      Weight --      Height --      Head Cir --      Peak Flow --      Pain Score --      Pain Loc --      Pain Edu? --      Excl. in GC? --     Constitutional: Alert and oriented. Well appearing and in no acute distress. Eyes: Conjunctivae are normal. PERRL. EOMI. Head: Atraumatic. Nose: No congestion/rhinnorhea. Mouth/Throat: Mucous membranes are moist.  Oropharynx non-erythematous. Neck: No stridor.   Cardiovascular: Normal rate, regular rhythm. Grossly normal heart sounds.  Good peripheral circulation. Respiratory: Normal respiratory effort.  No retractions. Lungs CTAB. Gastrointestinal: Soft and nontender. No distention. No abdominal bruits. No CVA tenderness. Musculoskeletal: No lower extremity tenderness Patient has chronic bilateral leg edema  No joint effusions.Patient had had pain in the right arm previously had had physical therapy for this patient reports there is no pain in the arm now although his daughter wanted me to check it out patient has no pain in the arm on palpation on movement on straining pushing me away or pointing  toward him pain had been present when he pushed away. Neurologic:  Normal speech and language. No gross focal neurologic deficits are appreciated. Cranial nerves II through XII are intact cerebellar finger-nose are normal motor strength is normal. Skin:  Skin is warm, dry and intact. No rash noted. Psychiatric: Mood and affect are normal. Speech and behavior are normal.  ____________________________________________   LABS (all labs ordered are listed, but only abnormal results are displayed)  Labs Reviewed  COMPREHENSIVE METABOLIC PANEL - Abnormal; Notable for the following:    Sodium 134 (*)    Chloride 99 (*)    Glucose, Bld 110 (*)    ALT 14 (*)    All other components within normal limits  BRAIN NATRIURETIC PEPTIDE - Abnormal; Notable for the following:    B Natriuretic Peptide 154.0 (*)    All other components within normal limits  CBC WITH DIFFERENTIAL/PLATELET - Abnormal; Notable for the following:    RBC 4.35 (*)    All other components within normal limits  URINALYSIS COMPLETEWITH MICROSCOPIC ([facility] ONLY) - Abnormal; Notable for the following:    Color, Urine YELLOW (*)    APPearance CLEAR (*)    Squamous Epithelial / LPF 0-5 (*)    All other components within normal limits  TROPONIN I   ____________________________________________  EKG  KG read and interpreted by me shows normal sinus rhythm rate of 84 left axis small complexes in V4 5 and 6 normal complexes elsewhere in no acute ST-T wave changes ____________________________________________  RADIOLOGY  FINDINGS: There is no edema or consolidation. Heart size is upper normal with pulmonary vascularity within normal limits. No adenopathy. Patient is status post coronary artery bypass grafting. 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 Stomach appears somewhat distended with fluid and air. There is degenerative change in both shoulders.  IMPRESSION: Stomach distended with fluid  and air. No edema or consolidation.   Electronically Signed  By: Bretta BangWilliam Woodruff III M.D.  On: 08/03/2015 12:17 ____________________________________________   PROCEDURES    ____________________________________________   INITIAL IMPRESSION / ASSESSMENT AND PLAN / ED COURSE  Pertinent labs & imaging results that were available during my care of the patient were reviewed by me and considered in my medical decision making (see chart for details).  Patient given to Dr. Fonnie BirkenheadWhiting for admission final disposition per him ____________________________________________   FINAL CLINICAL IMPRESSION(S) / ED DIAGNOSES  Final diagnoses:  Transient cerebral ischemia, unspecified transient cerebral ischemia type  Syncope, near      NEW MEDICATIONS STARTED DURING THIS VISIT:  Discharge Medication List as of 08/03/2015  2:09 PM       Note:  This document was prepared using Dragon voice recognition software and may include unintentional dictation errors.    Arnaldo NatalPaul F Kavish Lafitte, MD 08/03/15 [phone]

## 2015-08-03 NOTE — ED Notes (Signed)
Patient transported to X-ray 

## 2015-08-07 DIAGNOSIS — I6782 Cerebral ischemia: Secondary | ICD-10-CM | POA: Insufficient documentation

## 2015-08-07 DIAGNOSIS — I872 Venous insufficiency (chronic) (peripheral): Secondary | ICD-10-CM | POA: Insufficient documentation

## 2015-08-07 DIAGNOSIS — R0989 Other specified symptoms and signs involving the circulatory and respiratory systems: Secondary | ICD-10-CM | POA: Insufficient documentation

## 2015-08-07 DIAGNOSIS — G939 Disorder of brain, unspecified: Secondary | ICD-10-CM | POA: Insufficient documentation

## 2015-08-07 DIAGNOSIS — E785 Hyperlipidemia, unspecified: Secondary | ICD-10-CM | POA: Insufficient documentation

## 2015-08-07 DIAGNOSIS — R479 Unspecified speech disturbances: Secondary | ICD-10-CM | POA: Insufficient documentation

## 2015-08-07 DIAGNOSIS — I779 Disorder of arteries and arterioles, unspecified: Secondary | ICD-10-CM | POA: Insufficient documentation

## 2015-08-07 DIAGNOSIS — I2510 Atherosclerotic heart disease of native coronary artery without angina pectoris: Secondary | ICD-10-CM | POA: Insufficient documentation

## 2015-08-08 ENCOUNTER — Ambulatory Visit: Payer: Medicare Other | Attending: Family Medicine

## 2015-08-08 DIAGNOSIS — M6281 Muscle weakness (generalized): Secondary | ICD-10-CM | POA: Diagnosis present

## 2015-08-08 DIAGNOSIS — R262 Difficulty in walking, not elsewhere classified: Secondary | ICD-10-CM | POA: Diagnosis present

## 2015-08-08 NOTE — Patient Instructions (Signed)
Pt caregiver was recommended to perform sit <> stand with pt at least 10x total daily (3-5x at a time but rest breaks if needed for fatigue) (pt caregiver states pt performs sit <> stand 6x daily) as well as seated manually resisted leg press 10x3 (pt education on how to do so) each LE daily. Pt caregiver demonstrated and verbalized understanding.

## 2015-08-08 NOTE — Therapy (Signed)
[facility] PHYSICAL AND SPORTS MEDICINE [address] [place], [place], [address] Phone: [phone]   Fax:  [phone]  Physical Therapy Treatment  Patient Details  Name: [name] [name] MRN: [id number] Date of Birth: 01/20/1922 Referring Provider: Olevia PerchesMegan Johnson, DO  Encounter Date: 08/08/2015      PT End of Session - 08/08/15 1622    Visit Number [id number]   Number of Visits 71   Date for PT Re-Evaluation 09/07/15   Authorization Type 5   Authorization Time Period of 10   PT Start Time 1621   PT Stop Time 1702   PT Time Calculation (min) 41 min   Equipment Utilized During Treatment Gait belt;Other (comment)  RW   Activity Tolerance Patient tolerated treatment well   Behavior [facility] for tasks assessed/performed      Past Medical History  Diagnosis Date  . CAD (coronary artery disease)     nuclear 01/2008, no ischemia, EF 60%, distal anterior scar  . Hyperlipidemia   . Syncope     .remote..no etiology  /  syncope 11/2008.. from acute leg pain  . Venous insufficiency     support hose  . Carotid artery disease (HCC)     doppler 05/2009,  0-39% bilateral / doppler 06/2010 OK  . Elevated CPK     11/23/08.Marland Kitchen.Marland Kitchen.probably from debris from popliteal aneurysm  . Popliteal aneurysm (HCC)     .repair..11/25/2008  Dr. Darrick PennaFields  . Speech problem     Neurologic event with brief infusion and speech difficulty.... January, 2011.... being assessed by neurology  February, 2011  . Hx of CABG     1998  . Ejection fraction     EF 45%, echo,05/2009,apical hypo,  ?? posterior and lateral hypo  . Prominent abdominal aortic pulsation     doppler normal  . Intracranial hemorrhage (HCC)     Two small subependymal bleeds 06/2010,,ASA held  . Bradycardia     06/2010  . Edema     Ankle edema August, 2012  . TIA (transient ischemic attack)     Brief episode of confusion and speech difficulty, January, 2012  /  recurrent confusion speech difficulty September, 2012   . PAD (peripheral artery disease) (HCC)     Above the knee to below the knee popliteal bypass,, right  //    Doppler,  July, 2013, patent  . Fall at home July 2015    Past Surgical History  Procedure Laterality Date  . Coronary artery bypass graft  1998  . Popliteal bypass  with reversed greater saphenous vein from right leg.      Filed Vitals:   08/08/15 1623  SpO2: 96%        Subjective Assessment - 08/08/15 1710    Subjective 4-5/10 R biceps soreness. No ankle pain currently.  Pt accompanied by his caregiver (5 days a week with occasional Saturdays, 9 hour days)    Patient is accompained by: --  caregiver   Limitations Standing;Walking;House hold activities   How long can you walk comfortably? 2 min   Patient Stated Goals "To be able to walk again."   Currently in Pain? Yes   Pain Score 5   R biceps soreness   Multiple Pain Sites No         Objectives   There-ex     Directed patient with gait 31 ft, 51 ft (93%-96% SpO2), 49 ft (98%SpO2)  with rw CGA to min A +1 (second person pushing  a wc)  Cues for increasing hip and knee flexion, step length, staying close to rw  seated manually resisted leg press 10x 3 each LE  Reviewed and given as part of his HEP. Pt caregiver demonstrated and verbalized understanding.  Sit <> stand throughout session mod to min A. Pt caregiver was recommended to have him stand up at least 10x daily ( maybe 3-5x in a row if able but rest breaks provided)   Seated hip flexion resisting 2 lb ankle weight 10x each LE  Seated ankle DF stretch 1 min each LE  Seated manually resisted clam shells 10x2 with 5 second holds   Improved exercise technique, movement at target joints, use of target muscles after mod verbal, visual, tactile cues.       Pt needs consistent cues for proper hand and foot placement, leaning forward to place and maintain his center of gravity over his base of support when performing sit <> stand transfers. Pt also  needs consistent cues for increasing step length, hip and knee flexion for better foot clearance, as well as staying closer to rw when ambulating. Pt able to ambulate over 30 ft 3 times today, walking up to 51 ft.                    PT Education - 08/08/15 1828    Education provided Yes   Education Details ther-ex, HEP   Person(s) Educated Patient;Caregiver(s)   Methods Explanation;Demonstration;Tactile cues;Verbal cues   Comprehension Returned demonstration;Verbalized understanding             PT Long Term Goals - 07/27/15 1920    PT LONG TERM GOAL #1   Title Patient will demonstrate improved TUG score of  1 min  to decrease fall risk   Time 6   Period Weeks   Status On-going   PT LONG TERM GOAL #2   Title Patient will be able to ambulate with rw at least 70 ft with CGA +1 (second person pushing wheel chair) without rest to promote ability to ambulate around the house.    Time 6   Period Weeks   Status On-going   PT LONG TERM GOAL #3   Title Patient will demonstrate 5x STS in 1 min 45 seconds for improved strength and mobility at home. CGA   Time 6   Period Weeks   Status Revised   PT LONG TERM GOAL #4   Title Patient will improve LEFS score to > 25/80 demonstrating improved function.   Time 6   Period Weeks   Status On-going   PT LONG TERM GOAL #5   Title Patient will have a decrease in R biceps pain to 4/10 or less on average to promote ability to perform transfers and ambulate using a rw.   Baseline 6/10 average   Time 6   Period Weeks   Status New               Plan - 08/08/15 1837    Clinical Impression Statement Pt needs consistent cues for proper hand and foot placement, leaning forward to place and maintain his center of gravity over his base of support when performing sit <> stand transfers. Pt also needs consistent cues for increasing step length, hip and knee flexion for better foot clearance, as well as staying closer to rw when  ambulating. Pt able to ambulate over 30 ft 3 times today, walking up to 51 ft.     Rehab Potential Fair  Clinical Impairments Affecting Rehab Potential age, bilateral LE edema, weakness, decreased endurance and activity tolerance, bilateral ankle pain, knee discomfort at times, R biceps pain, fatigue   PT Frequency 2x / week   PT Duration 6 weeks   PT Treatment/Interventions ADLs/Self Care Home Management;Gait training;Therapeutic activities;Therapeutic exercise;Balance training;Wheelchair mobility training;Patient/family education;Passive range of motion   PT Next Visit Plan Strengthening, gait, stretching of LEs. balance   PT Home Exercise Plan Continue as prescribed   Consulted and Agree with Plan of Care Patient;Family member/caregiver   Family Member Consulted caregiver      Patient will benefit from skilled therapeutic intervention in order to improve the following deficits and impairments:  Decreased activity tolerance, Decreased balance, Impaired flexibility, Decreased mobility  Visit Diagnosis: Muscle weakness (generalized)  Difficulty in walking, not elsewhere classified     Problem List Patient Active Problem List   Diagnosis Date Noted  . Pneumonia 02/09/2015  . 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 Discoloration of skin of foot-Left 10/13/2013  . PAD (peripheral artery disease) (HCC)   . TIA (transient ischemic attack)   . Edema   . Hyperlipidemia   . Syncope   . CAD (coronary artery disease)   . Venous insufficiency   . Carotid artery disease (HCC)   . Elevated CPK   . Popliteal aneurysm (HCC)   . Speech problem   . Hx of CABG   . Ejection fraction   . Prominent abdominal aortic pulsation   . Intracranial hemorrhage (HCC)   . Bradycardia     Loralyn Freshwater PT, DPT    08/08/2015, 6:49 PM  [facility] PH[facility] SPORTS MEDICINE [address] [place], [address] Phone: [phone]   Fax:   [phone]  Name: [name] [name] MRN: [id number] Date of Birth: Mar 21, 1921

## 2015-08-10 ENCOUNTER — Ambulatory Visit: Payer: Medicare Other

## 2015-08-10 VITALS — BP 123/61 | HR 71

## 2015-08-10 DIAGNOSIS — M6281 Muscle weakness (generalized): Secondary | ICD-10-CM

## 2015-08-10 DIAGNOSIS — R262 Difficulty in walking, not elsewhere classified: Secondary | ICD-10-CM

## 2015-08-10 NOTE — Therapy (Signed)
[facility] [address], [place], [address] Phone: [phone]   Fax:  [phone]  Physical Therapy Treatment  Patient Details  Name: [name] [name] MRN: [id number] Date of Birth: 1921/12/29 Referring Provider: Olevia Perches, DO  Encounter Date: 08/10/2015      PT End of Session - 08/10/15 1621    Visit Number 49   Number of Visits 71   Date for PT Re-Evaluation 09/07/15   Authorization Type 6   Authorization Time Period of 10   PT Start Time 1622  pt arrived late   PT Stop Time 1705   PT Time Calculation (min) 43 min   Equipment Utilized During Treatment Gait belt;Other (comment)  RW   Activity Tolerance Patient tolerated treatment well   Behavior During Therapy [facility] for tasks assessed/performed      Past Medical History  Diagnosis Date  . CAD (coronary artery disease)     nuclear 01/2008, no ischemia, EF 60%, distal anterior scar  . Hyperlipidemia   . Syncope     .remote..no etiology  /  syncope 11/2008.. from acute leg pain  . Venous insufficiency     support hose  . Carotid artery disease (HCC)     doppler 05/2009,  0-39% bilateral / doppler 06/2010 OK  . Elevated CPK     11/23/08.Marland KitchenMarland Kitchenprobably from debris from popliteal aneurysm  . Popliteal aneurysm (HCC)     .repair..11/25/2008  Dr. Darrick Penna  . Speech problem     Neurologic event with brief infusion and speech difficulty.... January, 2011.... being assessed by neurology  February, 2011  . Hx of CABG     1998  . Ejection fraction     EF 45%, echo,05/2009,apical hypo,  ?? posterior and lateral hypo  . Prominent abdominal aortic pulsation     doppler normal  . Intracranial hemorrhage (HCC)     Two small subependymal bleeds 06/2010,,ASA held  . Bradycardia     06/2010  . Edema     Ankle edema August, 2012  . TIA (transient ischemic attack)     Brief episode of confusion and speech difficulty, January, 2012  /  recurrent confusion speech  difficulty September, 2012  . PAD (peripheral artery disease) (HCC)     Above the knee to below the knee popliteal bypass,, right  //    Doppler,  July, 2013, patent  . Fall at home July 2015    Past Surgical History  Procedure Laterality Date  . Coronary artery bypass graft  1998  . Popliteal bypass  with reversed greater saphenous vein from right leg.      Filed Vitals:   08/10/15 1629  BP: 123/61  Pulse: 71        Subjective Assessment - 08/10/15 1629    Subjective Pt states doing ok. No ankle pain. Just some pain in R arm 3-4/10 currently   Patient is accompained by: Family member  daughter and wife   Limitations Standing;Walking;House hold activities   How long can you walk comfortably? 2 min   Patient Stated Goals "To be able to walk again."   Currently in Pain? Yes   Pain Score 4   R arm              Objectives   There-ex   SpO2 95%-96% at start of session.    seated manually resisted leg press 10x 3 each LE   Gait 43 ft (95% SpO2), 32 ft, and  46 ft (97% SpO2) with CGA +1,   Min to mod cues for increasing step length, staying closer to rw, and hip and knee flexion for foot clearance.   Seated manual ankle DF stretch 1 min each LE  Seated hip flexion resisting 2 lb ankle weight 10x2 each LE to promote hip flexion during gait  Seated knee extension resisting 2 lbs 10x2 each LE   Sit <> stand throughout session min A to CGA   Improved ability to perform transfer today with min to mod cues (less cues compared to max cues on average for proper technique and hand and foot placement)   Seated manually resisted clam shells 10x2 with 5 second holds   Improved exercise technique, movement at target joints, use of target muscles after min to mod verbal, visual, tactile cues.     Better able to stand up from the chair today needing CGA at best with less cues compared to previous sessions. Able to ambulate at least 32 ft or greater again today using his  rw, needing min to mod cues for increasing step length, staying closer to rw, and increasing hip and knee flexion for foot clearance, less cues compared to mod to max on other days.                     PT Education - 08/10/15 1633    Education provided Yes   Education Details ther-ex   [employer]) Educated Patient   Methods Demonstration;Explanation;Tactile cues;Verbal cues   Comprehension Returned demonstration;Verbalized understanding             PT Long Term Goals - 07/27/15 1920    PT LONG TERM GOAL #1   Title Patient will demonstrate improved TUG score of  1 min  to decrease fall risk   Time 6   Period Weeks   Status On-going   PT LONG TERM GOAL #2   Title Patient will be able to ambulate with rw at least 70 ft with CGA +1 (second person pushing wheel chair) without rest to promote ability to ambulate around the house.    Time 6   Period Weeks   Status On-going   PT LONG TERM GOAL #3   Title Patient will demonstrate 5x STS in 1 min 45 seconds for improved strength and mobility at home. CGA   Time 6   Period Weeks   Status Revised   PT LONG TERM GOAL #4   Title Patient will improve LEFS score to > 25/80 demonstrating improved function.   Time 6   Period Weeks   Status On-going   PT LONG TERM GOAL #5   Title Patient will have a decrease in R biceps pain to 4/10 or less on average to promote ability to perform transfers and ambulate using a rw.   Baseline 6/10 average   Time 6   Period Weeks   Status New               Plan - 08/10/15 1636    Clinical Impression Statement Better able to stand up from the chair today needing CGA at best with less cues compared to previous sessions. Able to ambulate at least 32 ft or greater again today using his rw, needing min to mod cues for increasing step length, staying closer to rw, and increasing hip and knee flexion for foot clearance, less cues compared to mod to max on other days.    Rehab Potential Fair  Clinical Impairments Affecting Rehab Potential age, bilateral LE edema, weakness, decreased endurance and activity tolerance, bilateral ankle pain, knee discomfort at times, R biceps pain, fatigue   PT Frequency 2x / week   PT Duration 6 weeks   PT Treatment/Interventions ADLs/Self Care Home Management;Gait training;Therapeutic activities;Therapeutic exercise;Balance training;Wheelchair mobility training;Patient/family education;Passive range of motion   PT Next Visit Plan Strengthening, gait, stretching of LEs. balance   PT Home Exercise Plan Continue as prescribed   Consulted and Agree with Plan of Care Patient;Family member/caregiver   Family Member Consulted caregiver      Patient will benefit from skilled therapeutic intervention in order to improve the following deficits and impairments:  Decreased activity tolerance, Decreased balance, Impaired flexibility, Decreased mobility  Visit Diagnosis: Muscle weakness (generalized)  Difficulty in walking, not elsewhere classified     Problem List Patient Active Problem List   Diagnosis Date Noted  . Pneumonia 02/09/2015  . 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 Discoloration of skin of foot-Left 10/13/2013  . PAD (peripheral artery disease) (HCC)   . TIA (transient ischemic attack)   . Edema   . Hyperlipidemia   . Syncope   . CAD (coronary artery disease)   . Venous insufficiency   . Carotid artery disease (HCC)   . Elevated CPK   . Popliteal aneurysm (HCC)   . Speech problem   . Hx of CABG   . Ejection fraction   . Prominent abdominal aortic pulsation   . Intracranial hemorrhage (HCC)   . Bradycardia     Loralyn FreshwaterMiguel Min Collymore PT, DPT   08/10/2015, 7:28 PM  [facility] [address] [place], [place], [id number] Phone: [phone]   Fax:  [phone]  Name: Darden Palmerlbert L Figge MRN: [id number] Date of Birth: 03/29/1921

## 2015-08-15 ENCOUNTER — Ambulatory Visit: Payer: Medicare Other

## 2015-08-15 VITALS — BP 116/67 | HR 78

## 2015-08-15 DIAGNOSIS — M6281 Muscle weakness (generalized): Secondary | ICD-10-CM

## 2015-08-15 DIAGNOSIS — R262 Difficulty in walking, not elsewhere classified: Secondary | ICD-10-CM

## 2015-08-15 NOTE — Patient Instructions (Addendum)
Pt caregiver training in walking with pt (always have at least 2 people, one to guard and assist pt, and the other to push the wc) with caregiver to provide cues to increase hip and knee flexion, step length, and to stay closer to rw. Pt caregiver to take vitals such as SpO2 (can walk pt if SpO2 is 95% or higher) and blood pressure, making sure that they are within normal limits prior to walking. If pt caregiver feels uncomfortable doing so, she does not have to. Pt caregiver also to walk patient only to what he can do and not to overwork him. Pt caregiver demonstrated and verbalized understanding. Pt caregiver also verbalized understanding on how to prevent pt from falling if he gets fatigued or loses balance when she is guarding him.

## 2015-08-15 NOTE — Therapy (Signed)
[place] [address], [place], [address] Phone: [phone]   Fax:  [phone]  Physical Therapy Treatment  Patient Details  Name: [name] [name] MRN: [id number] Date of Birth: [birth year] Referring Provider: Olevia Perches, DO  Encounter Date: 08/15/2015      PT End of Session - 08/15/15 1617    Visit Number 50   Number of Visits 71   Date for PT Re-Evaluation 09/07/15   Authorization Type 7   Authorization Time Period of 10   PT Start Time 1618   PT Stop Time 1702   PT Time Calculation (min) 44 min   Equipment Utilized During Treatment Gait belt;Other (comment)  RW   Activity Tolerance Patient tolerated treatment well   Behavior During Therapy [facility] for tasks assessed/performed      Past Medical History  Diagnosis Date  . CAD (coronary artery disease)     nuclear 01/2008, no ischemia, EF 60%, distal anterior scar  . Hyperlipidemia   . Syncope     .remote..no etiology  /  syncope 11/2008.. from acute leg pain  . Venous insufficiency     support hose  . Carotid artery disease (HCC)     doppler 05/2009,  0-39% bilateral / doppler 06/2010 OK  . Elevated CPK     11/23/08.Marland KitchenMarland Kitchenprobably from debris from popliteal aneurysm  . Popliteal aneurysm (HCC)     .repair..11/25/2008  Dr. Darrick Penna  . Speech problem     Neurologic event with brief infusion and speech difficulty.... January, 2011.... being assessed by neurology  February, 2011  . Hx of CABG     1998  . Ejection fraction     EF 45%, echo,05/2009,apical hypo,  ?? posterior and lateral hypo  . Prominent abdominal aortic pulsation     doppler normal  . Intracranial hemorrhage (HCC)     Two small subependymal bleeds 06/2010,,ASA held  . Bradycardia     06/2010  . Edema     Ankle edema August, 2012  . TIA (transient ischemic attack)     Brief episode of confusion and speech difficulty, January, 2012  /  recurrent confusion speech difficulty September,  2012  . PAD (peripheral artery disease) (HCC)     Above the knee to below the knee popliteal bypass,, right  //    Doppler,  July, 2013, patent  . Fall at home July 2015    Past Surgical History  Procedure Laterality Date  . Coronary artery bypass graft  1998  . Popliteal bypass  with reversed greater saphenous vein from right leg.      Filed Vitals:   08/15/15 1621  BP: 116/67  Pulse: 78  SpO2: 97%        Subjective Assessment - 08/15/15 1621    Subjective 6-8/10 R arm pain currently. Pt caregiver states pt uses his R arm a lot for transfers.    Patient is accompained by: --  caregiver   Limitations Standing;Walking;House hold activities   How long can you walk comfortably? 2 min   Patient Stated Goals "To be able to walk again."   Currently in Pain? Yes   Pain Score 6   6-8/10 R arm    Multiple Pain Sites No          Objectives    Manual therapy  STM R biceps   Decreased R biceps pain afterwards  There-ex   Vitals obtained. SpO2 remained at normal range throughout session.  Directed patient with sit <> stand throughout session with CGA to min A  R biceps stretch 30 seconds x 3  decreased R arm pain  Gait 29 ft (96% SpO2), 14 ft, 26 ft, 22 ft (95%)with CGA to min A +1  Caregiver training with walking with pt at home, with pt caregiver to cue pt to increase hip and knee flexion, step length and stay closer to rw.   Please see patient instructions.    Improved exercise technique, movement at target joints, use of target muscles after mod verbal, visual, tactile cues.     Decreased R arm pain after manual therapy and biceps stretch. Worked on R arm to help decrease pain to promote independence when performing transfers at home. Trained caregiver with walking with pt at home to help promote function and LE strength (please see pt instructions). Pt able to perform sit to stand with CGA today, but needed mod A from stand to sit. Pt able to ambulate and  average of at least 22 ft today. Difficulty walking over 30 ft today. Patient will benefit from continued skilled physical therapy services to promote LE strength, endurance, ability to perform transfers, and ambulate longer distances.                           PT Education - 08/15/15 1709    Education provided Yes   Education Details ther-ex, gait with caregiver (at least 2 people, one to guard patient when walking and the other to push the wc)   Person(s) Educated Patient;Caregiver(s)   Methods Explanation;Demonstration;Tactile cues;Verbal cues   Comprehension Verbalized understanding;Returned demonstration             PT Long Term Goals - 07/27/15 1920    PT LONG TERM GOAL #1   Title Patient will demonstrate improved TUG score of  1 min  to decrease fall risk   Time 6   Period Weeks   Status On-going   PT LONG TERM GOAL #2   Title Patient will be able to ambulate with rw at least 70 ft with CGA +1 (second person pushing wheel chair) without rest to promote ability to ambulate around the house.    Time 6   Period Weeks   Status On-going   PT LONG TERM GOAL #3   Title Patient will demonstrate 5x STS in 1 min 45 seconds for improved strength and mobility at home. CGA   Time 6   Period Weeks   Status Revised   PT LONG TERM GOAL #4   Title Patient will improve LEFS score to > 25/80 demonstrating improved function.   Time 6   Period Weeks   Status On-going   PT LONG TERM GOAL #5   Title Patient will have a decrease in R biceps pain to 4/10 or less on average to promote ability to perform transfers and ambulate using a rw.   Baseline 6/10 average   Time 6   Period Weeks   Status New               Plan - 08/15/15 1710    Clinical Impression Statement Decreased R arm pain after manual therapy and biceps stretch. Worked on R arm to help decrease pain to promote independence when performing transfers at home. Trained caregiver with walking with pt  at home to help promote function and LE strength (please see pt instructions). Pt able to perform sit to stand with CGA  today, but needed mod A from stand to sit. Pt able to ambulate and average of at least 22 ft today. Difficulty walking over 30 ft today. Patient will benefit from continued skilled physical therapy services to promote LE strength, endurance, ability to perform transfers, and ambulate longer distances.    Rehab Potential Fair   Clinical Impairments Affecting Rehab Potential age, bilateral LE edema, weakness, decreased endurance and activity tolerance, bilateral ankle pain, knee discomfort at times, R biceps pain, fatigue   PT Frequency 2x / week   PT Duration 6 weeks   PT Treatment/Interventions ADLs/Self Care Home Management;Gait training;Therapeutic activities;Therapeutic exercise;Balance training;Wheelchair mobility training;Patient/family education;Passive range of motion   PT Next Visit Plan Strengthening, gait, stretching of LEs. balance   PT Home Exercise Plan Continue as prescribed   Consulted and Agree with Plan of Care Patient;Family member/caregiver   Family Member Consulted caregiver      Patient will benefit from skilled therapeutic intervention in order to improve the following deficits and impairments:  Decreased activity tolerance, Decreased balance, Impaired flexibility, Decreased mobility  Visit Diagnosis: Muscle weakness (generalized)  Difficulty in walking, not elsewhere classified     Problem List Patient Active Problem List   Diagnosis Date Noted  . Pneumonia 02/09/2015  . 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 Discoloration of skin of foot-Left 10/13/2013  . PAD (peripheral artery disease) (HCC)   . TIA (transient ischemic attack)   . Edema   . Hyperlipidemia   . Syncope   . CAD (coronary artery disease)   . Venous insufficiency   . Carotid artery disease (HCC)   . Elevated CPK   . Popliteal aneurysm (HCC)   . Speech problem   .  Hx of CABG   . Ejection fraction   . Prominent abdominal aortic pulsation   . Intracranial hemorrhage (HCC)   . Bradycardia     Loralyn Freshwater PT, DPT    08/15/2015, 5:36 PM   [facility] AND SPORTS MEDICINE [address], [place], [address] Phone: [phone]   Fax:  [phone]  Name: [name] [name] MRN: [id number] Date of Birth: 02/05/1922

## 2015-08-16 ENCOUNTER — Emergency Department: Payer: Medicare Other

## 2015-08-16 ENCOUNTER — Inpatient Hospital Stay
Admission: EM | Admit: 2015-08-16 | Discharge: 2015-08-22 | DRG: 069 | Disposition: A | Discharge status: Skilled Nursing Facility | Payer: Medicare Other | Attending: Internal Medicine | Admitting: Internal Medicine

## 2015-08-16 DIAGNOSIS — Z833 Family history of diabetes mellitus: Secondary | ICD-10-CM

## 2015-08-16 DIAGNOSIS — E871 Hypo-osmolality and hyponatremia: Secondary | ICD-10-CM | POA: Diagnosis present

## 2015-08-16 DIAGNOSIS — J209 Acute bronchitis, unspecified: Secondary | ICD-10-CM | POA: Diagnosis present

## 2015-08-16 DIAGNOSIS — H269 Unspecified cataract: Secondary | ICD-10-CM | POA: Diagnosis present

## 2015-08-16 DIAGNOSIS — R059 Cough, unspecified: Secondary | ICD-10-CM

## 2015-08-16 DIAGNOSIS — R6884 Jaw pain: Secondary | ICD-10-CM | POA: Diagnosis present

## 2015-08-16 DIAGNOSIS — Z961 Presence of intraocular lens: Secondary | ICD-10-CM | POA: Diagnosis present

## 2015-08-16 DIAGNOSIS — I639 Cerebral infarction, unspecified: Secondary | ICD-10-CM

## 2015-08-16 DIAGNOSIS — E785 Hyperlipidemia, unspecified: Secondary | ICD-10-CM | POA: Diagnosis present

## 2015-08-16 DIAGNOSIS — R000 Tachycardia, unspecified: Secondary | ICD-10-CM | POA: Diagnosis present

## 2015-08-16 DIAGNOSIS — Z7982 Long term (current) use of aspirin: Secondary | ICD-10-CM

## 2015-08-16 DIAGNOSIS — I739 Peripheral vascular disease, unspecified: Secondary | ICD-10-CM | POA: Diagnosis present

## 2015-08-16 DIAGNOSIS — R011 Cardiac murmur, unspecified: Secondary | ICD-10-CM | POA: Diagnosis present

## 2015-08-16 DIAGNOSIS — Q133 Congenital corneal opacity: Secondary | ICD-10-CM

## 2015-08-16 DIAGNOSIS — Z87891 Personal history of nicotine dependence: Secondary | ICD-10-CM

## 2015-08-16 DIAGNOSIS — R600 Localized edema: Secondary | ICD-10-CM | POA: Diagnosis present

## 2015-08-16 DIAGNOSIS — G9340 Encephalopathy, unspecified: Secondary | ICD-10-CM | POA: Diagnosis present

## 2015-08-16 DIAGNOSIS — Z823 Family history of stroke: Secondary | ICD-10-CM

## 2015-08-16 DIAGNOSIS — R05 Cough: Secondary | ICD-10-CM | POA: Diagnosis not present

## 2015-08-16 DIAGNOSIS — R32 Unspecified urinary incontinence: Secondary | ICD-10-CM | POA: Diagnosis present

## 2015-08-16 DIAGNOSIS — R0602 Shortness of breath: Secondary | ICD-10-CM

## 2015-08-16 DIAGNOSIS — H547 Unspecified visual loss: Secondary | ICD-10-CM

## 2015-08-16 DIAGNOSIS — E876 Hypokalemia: Secondary | ICD-10-CM | POA: Diagnosis not present

## 2015-08-16 DIAGNOSIS — Z951 Presence of aortocoronary bypass graft: Secondary | ICD-10-CM

## 2015-08-16 DIAGNOSIS — G459 Transient cerebral ischemic attack, unspecified: Principal | ICD-10-CM | POA: Diagnosis present

## 2015-08-16 DIAGNOSIS — H538 Other visual disturbances: Secondary | ICD-10-CM | POA: Diagnosis present

## 2015-08-16 DIAGNOSIS — I2510 Atherosclerotic heart disease of native coronary artery without angina pectoris: Secondary | ICD-10-CM | POA: Diagnosis present

## 2015-08-16 DIAGNOSIS — H5340 Unspecified visual field defects: Secondary | ICD-10-CM

## 2015-08-16 DIAGNOSIS — I480 Paroxysmal atrial fibrillation: Secondary | ICD-10-CM | POA: Diagnosis present

## 2015-08-16 DIAGNOSIS — Z8673 Personal history of transient ischemic attack (TIA), and cerebral infarction without residual deficits: Secondary | ICD-10-CM

## 2015-08-16 DIAGNOSIS — R4182 Altered mental status, unspecified: Secondary | ICD-10-CM

## 2015-08-16 DIAGNOSIS — K30 Functional dyspepsia: Secondary | ICD-10-CM | POA: Diagnosis present

## 2015-08-16 LAB — URINALYSIS COMPLETE WITH MICROSCOPIC (ARMC ONLY)
BILIRUBIN URINE: NEGATIVE
Bacteria, UA: NONE SEEN
Glucose, UA: NEGATIVE mg/dL
HGB URINE DIPSTICK: NEGATIVE
Ketones, ur: NEGATIVE mg/dL
LEUKOCYTES UA: NEGATIVE
Nitrite: NEGATIVE
PH: 6 (ref 5.0–8.0)
PROTEIN: NEGATIVE mg/dL
Specific Gravity, Urine: 1.016 (ref 1.005–1.030)

## 2015-08-16 LAB — BASIC METABOLIC PANEL
Anion gap: 11 (ref 5–15)
BUN: 16 mg/dL (ref 6–20)
CO2: 25 mmol/L (ref 22–32)
CREATININE: 0.71 mg/dL (ref 0.61–1.24)
Calcium: 9.1 mg/dL (ref 8.9–10.3)
Chloride: 97 mmol/L — ABNORMAL LOW (ref 101–111)
Glucose, Bld: 101 mg/dL — ABNORMAL HIGH (ref 65–99)
POTASSIUM: 4.2 mmol/L (ref 3.5–5.1)
SODIUM: 133 mmol/L — AB (ref 135–145)

## 2015-08-16 LAB — CBC
HCT: 39.5 % — ABNORMAL LOW (ref 40.0–52.0)
HEMOGLOBIN: 13.3 g/dL (ref 13.0–18.0)
MCH: 30.3 pg (ref 26.0–34.0)
MCHC: 33.8 g/dL (ref 32.0–36.0)
MCV: 89.7 fL (ref 80.0–100.0)
PLATELETS: 163 10*3/uL (ref 150–440)
RBC: 4.4 MIL/uL (ref 4.40–5.90)
RDW: 14.3 % (ref 11.5–14.5)
WBC: 8.4 10*3/uL (ref 3.8–10.6)

## 2015-08-16 MED ORDER — ASPIRIN 81 MG PO CHEW: 162.0000 mg | CHEWABLE_TABLET | Freq: Once | ORAL | Status: DC

## 2015-08-16 MED ORDER — ASPIRIN 81 MG PO CHEW
324.0000 mg | CHEWABLE_TABLET | Freq: Once | ORAL | Status: AC
  Administered 2015-08-16: 324 mg via ORAL
  Filled 2015-08-16: qty 4

## 2015-08-16 NOTE — ED Notes (Addendum)
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  Pt denies vision problems at this time, per EMS pts family called out b/c pts sys "wasn't tracking right".  Pt denies pain, SOB, n/v/d, CP, falls or dizziness/weakness.  Family at bedside, sts started 2 hours ago

## 2015-08-16 NOTE — ED Notes (Signed)
Patient transported to CT 

## 2015-08-16 NOTE — ED Provider Notes (Addendum)
[facility] Emergency Department Provider Note  ____________________________________________  Time seen: Approximately 8:08 PM  I have reviewed the triage vital signs and the nursing notes.   HISTORY  Chief Complaint Eye Pain    HPI [name] [name] is a 82 y.o. male with a history of CAD s/p CABG, TIA, peripheral artery disease presenting with "not acting right" and possible visual complaint. The patient's daughter gives most of the history and describes that over the past several days the patient has had a nonproductive cough with clear rhinorrhea without fever. Today, she went over to the patient's house and he states that his eyes were "have closed." This is sometimes happened when he doesn't feel well. However, the family started to test the patient for possible stroke symptoms, and while he had no facial droop and a normal grip strength, he was unable to track his extraocular movements to the left lateral gaze. The nursing notes states that he had sudden blindness, but this is noncardiac, there is no history of confirmed visual change.   Past Medical History  Diagnosis Date  . CAD (coronary artery disease)     nuclear 01/2008, no ischemia, EF 60%, distal anterior scar  . Hyperlipidemia   . Syncope     .remote..no etiology  /  syncope 11/2008.. from acute leg pain  . Venous insufficiency     support hose  . Carotid artery disease (HCC)     doppler 05/2009,  0-39% bilateral / doppler 06/2010 OK  . Elevated CPK     11/23/08.Marland Kitchen.Marland Kitchen.probably from debris from popliteal aneurysm  . Popliteal aneurysm (HCC)     .repair..11/25/2008  Dr. Darrick PennaFields  . Speech problem     Neurologic event with brief infusion and speech difficulty.... January, 2011.... being assessed by neurology  February, 2011  . Hx of CABG     1998  . Ejection fraction     EF 45%, echo,05/2009,apical hypo,  ?? posterior and lateral hypo  . Prominent abdominal aortic pulsation     doppler normal  .  Intracranial hemorrhage (HCC)     Two small subependymal bleeds 06/2010,,ASA held  . Bradycardia     06/2010  . Edema     Ankle edema August, 2012  . TIA (transient ischemic attack)     Brief episode of confusion and speech difficulty, January, 2012  /  recurrent confusion speech difficulty September, 2012  . PAD (peripheral artery disease) (HCC)     Above the knee to below the knee popliteal bypass,, right  //    Doppler,  July, 2013, patent  . Fall at home July 2015    Patient Active Problem List   Diagnosis Date Noted  . Pneumonia 02/09/2015  . 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 Discoloration of skin of foot-Left 10/13/2013  . PAD (peripheral artery disease) (HCC)   . TIA (transient ischemic attack)   . Edema   . Hyperlipidemia   . Syncope   . CAD (coronary artery disease)   . Venous insufficiency   . Carotid artery disease (HCC)   . Elevated CPK   . Popliteal aneurysm (HCC)   . Speech problem   . Hx of CABG   . Ejection fraction   . Prominent abdominal aortic pulsation   . Intracranial hemorrhage (HCC)   . Bradycardia     Past Surgical History  Procedure Laterality Date  . Coronary artery bypass graft  1998  . Popliteal bypass  with reversed greater saphenous vein  from right leg.      Current Outpatient Rx  Name  Route  Sig  Dispense  Refill  . aspirin EC 81 MG tablet   Oral   Take 81 mg by mouth daily.         Marland Kitchen azithromycin (ZITHROMAX) 250 MG tablet      One tab daily for 4 days   4 each   0   . calcium carbonate (TUMS - DOSED IN MG ELEMENTAL CALCIUM) 500 MG chewable tablet   Oral   Chew 2 tablets by mouth daily.         Marland Kitchen docusate sodium (COLACE) 100 MG capsule   Oral   Take 100 mg by mouth 2 (two) times daily as needed for mild constipation.         . Loratadine (CLARITIN REDITABS) 5 MG TBDP   Oral   Take by mouth 2 (two) times daily as needed.         . Multiple Vitamin (MULTIVITAMIN WITH MINERALS) TABS tablet   Oral   Take  1 tablet by mouth daily.           Allergies Penicillins; Latex; and Sulfonamide derivatives  Family History  Problem Relation Age of Onset  . Diabetes Sister   . Stroke Mother   . Diabetes Mother     Social History Social History  Substance Use Topics  . Smoking status: Former Smoker -- 20 years    Types: Cigarettes    Quit date: 09/12/1946  . Smokeless tobacco: Never Used     Comment: quit appox 50 yrs ago  . Alcohol Use: No    Review of Systems Constitutional: No fever/chills.No lightheadedness or syncope. No known trauma. Eyes: No confirmed visual changes. Inability to track to the left lateral gaze. ENT: No sore throat. Positive congestion and clear rhinorrhea. Cardiovascular: Denies chest pain. Denies palpitations. Respiratory: Denies shortness of breath.  Positive cough. Gastrointestinal: No abdominal pain.  No nausea, no vomiting.  No diarrhea.  No constipation. Genitourinary: Negative for dysuria. Musculoskeletal: Negative for back pain. Skin: Negative for rash. Neurological: Negative for headaches. No focal numbness, tingling or weakness. No change in gait. Positive inability to have lateral gaze to the left. No change in speech.  10-point ROS otherwise negative.  ____________________________________________   PHYSICAL EXAM:  VITAL SIGNS: ED Triage Vitals  Enc Vitals Group     BP 08/16/15 1914 147/95 mmHg     Pulse Rate 08/16/15 1914 88     Resp 08/16/15 1914 25     Temp 08/16/15 1914 99.3 F (37.4 C)     Temp Source 08/16/15 1914 Oral     SpO2 08/16/15 1914 96 %     Weight 08/16/15 1914 210 lb (95.255 kg)     Height --      Head Cir --      Peak Flow --      Pain Score 08/16/15 1915 0     Pain Loc --      Pain Edu? --      Excl. in GC? --     Constitutional: Alert and oriented. Well appearing and in no acute distress. Answers questions appropriately. Eyes: Conjunctivae are normal.  EOMI. No scleral icterus. Head: Atraumatic. Nose: No  congestion/rhinnorhea. Mouth/Throat: Mucous membranes are moist.  Neck: No stridor.  Supple.  No JVD. No meningismus. Cardiovascular: Normal rate, regular rhythm. No murmurs, rubs or gallops.  Respiratory: Normal respiratory effort.  No accessory muscle use or  retractions. Lungs CTAB.  No wheezes, rales or ronchi. Gastrointestinal: Soft, nontender and nondistended.  No guarding or rebound.  No peritoneal signs. Musculoskeletal: No LE edema. No ttp in the calves or palpable cords.  Negative Homan's sign. Neurologic: Alert and oriented to person and place but thinks it's the 1990s and that Felix Pacini is the president. Speech is clear. Naming is intact. Face and smile symmetric. When the patient is encouraged, he does have full extraocular movements. He wears glasses at baseline, and is unable to comply with full visual field testing, and it is unclear whether this is due to visual field deficit or inability to comply for other reasons including baseline vision deficits. Tongue has a mild left deviation.. 5 out of 5 grip, biceps, triceps, plantar flexion and dorsiflexion. 4+ out of 5 hip flexor strength. Normal sensation to light touch in the bilateral upper and lower extremities, and face.  Skin:  Skin is warm, dry and intact. No rash noted. Psychiatric: Mood and affect are normal.   ____________________________________________   LABS (all labs ordered are listed, but only abnormal results are displayed)  Labs Reviewed  CBC  BASIC METABOLIC PANEL  URINALYSIS COMPLETEWITH MICROSCOPIC ([facility] ONLY)   ____________________________________________  EKG  ED ECG REPORT I, Rockne Menghini, the attending physician, personally viewed and interpreted this ECG.   Date: 08/16/2015  EKG Time: 1911  Rate: 85  Rhythm: normal sinus rhythm  Axis: Leftward  Intervals:first degree block  ST&T Change: No ST changes  ____________________________________________  RADIOLOGY  Dg Chest 2  View  08/16/2015  CLINICAL DATA:  Cough EXAM: CHEST  2 VIEW COMPARISON:  08/03/2015 FINDINGS: Cardiomediastinal silhouette is stable. Again noted moderate gaseous distension of the stomach. Status post CABG. Osteopenia and mild degenerative change thoracic spine. No infiltrate or pulmonary edema. IMPRESSION: No active disease. Again noted moderate gaseous distension of the stomach. Electronically Signed   By: Natasha Mead M.D.   On: 08/16/2015 20:14   Ct Head Wo Contrast  08/16/2015  CLINICAL DATA:  Sudden blindness in the left eye. EXAM: CT HEAD WITHOUT CONTRAST TECHNIQUE: Contiguous axial images were obtained from the base of the skull through the vertex without intravenous contrast. COMPARISON:  08/03/2015 FINDINGS: Stable ventriculomegaly. There is stable low-density in the periventricular white matter. Evidence for mild cerebral atrophy. No evidence for acute hemorrhage, mass lesion, midline shift or large infarct. Mild mucosal disease in the right ethmoid air cells. Otherwise, the visualized paranasal sinuses are clear. Mastoid air cells are clear. No calvarial fracture. IMPRESSION: No acute intracranial abnormality. Stable ventriculomegaly. Stable mild atrophy and evidence for chronic small vessel ischemic changes. Electronically Signed   By: Richarda Overlie M.D.   On: 08/16/2015 20:08    ____________________________________________   PROCEDURES  Procedure(s) performed: None  Critical Care performed: No ____________________________________________   INITIAL IMPRESSION / ASSESSMENT AND PLAN / ED COURSE  Pertinent labs & imaging results that were available during my care of the patient were reviewed by me and considered in my medical decision making (see chart for details).  82 y.o. male who has recently had URI symptoms including rhinorrhea and cough, who was found to be unable to have left lateral gaze at home. Here, the patient does have full extraocular movements but his visual testing is  difficult to interpret. He does have a mild deviation of his tongue, but otherwise has no other focal neurologic findings. There are multiple possible etiologies including acute CVA or TIA. I would also consider infection including UTI  or pneumonia given his recent cough. We will evaluate for electrolyte disturbances.  The patient has been signed out to Dr. Scotty Court, who will follow up the results of this testing. I have had a long conversation with the family about further testing if his initial workup in the emergency department is negative. They have been offered inpatient evaluation for stroke workup including MRI, and will make a decision once we have the initial results back.  ____________________________________________  FINAL CLINICAL IMPRESSION(S) / ED DIAGNOSES  Final diagnoses:  Cough  Vision problem      NEW MEDICATIONS STARTED DURING THIS VISIT:  New Prescriptions   No medications on file     Rockne Menghini, MD 08/16/15 2017  Rockne Menghini, MD 08/16/15 2031

## 2015-08-16 NOTE — ED Notes (Signed)
MD at bedside. 

## 2015-08-16 NOTE — ED Notes (Signed)
Pt uprite on stretcher in exam room with no distress noted; family at bedside, voices good understanding of plan of care; awaiting hospitalist; pt with no c/o at present

## 2015-08-16 NOTE — ED Provider Notes (Signed)
Initial workup unremarkable including CT had chest x-ray labs and urinalysis. Had further extensive discussion with the family regarding plan of care and they would like to proceed with hospitalization for complete stroke workup. Vital signs remained stable. We'll give oral aspirin and discuss with hospitalist.  Sharman CheekPhillip Kirin Brandenburger, MD 08/16/15 [phone]

## 2015-08-17 ENCOUNTER — Observation Stay: Payer: Medicare Other

## 2015-08-17 ENCOUNTER — Ambulatory Visit: Payer: Medicare Other

## 2015-08-17 ENCOUNTER — Observation Stay (HOSPITAL_BASED_OUTPATIENT_CLINIC_OR_DEPARTMENT_OTHER)
Admit: 2015-08-17 | Discharge: 2015-08-17 | Disposition: A | Discharge status: Home / Self Care | Payer: Medicare Other | Attending: Internal Medicine | Admitting: Internal Medicine

## 2015-08-17 ENCOUNTER — Ambulatory Visit: Payer: Medicare Other | Admitting: Family Medicine

## 2015-08-17 DIAGNOSIS — H539 Unspecified visual disturbance: Secondary | ICD-10-CM | POA: Diagnosis not present

## 2015-08-17 DIAGNOSIS — G459 Transient cerebral ischemic attack, unspecified: Secondary | ICD-10-CM | POA: Diagnosis not present

## 2015-08-17 DIAGNOSIS — H53139 Sudden visual loss, unspecified eye: Secondary | ICD-10-CM | POA: Diagnosis not present

## 2015-08-17 DIAGNOSIS — R4182 Altered mental status, unspecified: Secondary | ICD-10-CM | POA: Diagnosis not present

## 2015-08-17 LAB — C-REACTIVE PROTEIN: CRP: 2 mg/dL — AB (ref <1.0)

## 2015-08-17 LAB — CBC
HCT: 38.9 % — ABNORMAL LOW (ref 40.0–52.0)
Hemoglobin: 13.2 g/dL (ref 13.0–18.0)
MCH: 30.5 pg (ref 26.0–34.0)
MCHC: 33.9 g/dL (ref 32.0–36.0)
MCV: 90.1 fL (ref 80.0–100.0)
PLATELETS: 156 10*3/uL (ref 150–440)
RBC: 4.31 MIL/uL — AB (ref 4.40–5.90)
RDW: 13.9 % (ref 11.5–14.5)
WBC: 7.4 10*3/uL (ref 3.8–10.6)

## 2015-08-17 LAB — ECHOCARDIOGRAM COMPLETE
AV Peak grad: 2 mmHg
AV pk vel: 70.6 cm/s
AVAREAVTI: 3.21 cm2
Ao pk vel: 0.85 m/s
CHL CUP AV PEAK INDEX: 1.5
E/e' ratio: 4.98
EWDT: 259 ms
FS: 51 % — AB (ref 28–44)
Height: 71 in
IV/PV OW: 1.15
LA diam index: 1.87 cm/m2
LA vol: 34.6 mL
LASIZE: 40 mm
LAVOLA4C: 36.4 mL
LAVOLIN: 16.2 mL/m2
LDCA: 3.8 cm2
LEFT ATRIUM END SYS DIAM: 40 mm
LV E/e'average: 4.98
LV PW d: 9.97 mm — AB (ref 0.6–1.1)
LV TDI E'MEDIAL: 4.35
LV e' LATERAL: 9.46 cm/s
LVEEMED: 4.98
LVOT diameter: 22 mm
LVOTPV: 59.7 cm/s
MV Dec: 259
MV pk A vel: 75.2 m/s
MV pk E vel: 47.1 m/s
TAPSE: 13 mm
TDI e' lateral: 9.46
WEIGHTICAEL: 3316 [oz_av]

## 2015-08-17 LAB — SEDIMENTATION RATE: SED RATE: 37 mm/h — AB (ref 0–20)

## 2015-08-17 LAB — HEMOGLOBIN A1C: HEMOGLOBIN A1C: 5.3 % (ref 4.0–6.0)

## 2015-08-17 LAB — BASIC METABOLIC PANEL
ANION GAP: 9 (ref 5–15)
BUN: 14 mg/dL (ref 6–20)
CO2: 27 mmol/L (ref 22–32)
Calcium: 8.9 mg/dL (ref 8.9–10.3)
Chloride: 97 mmol/L — ABNORMAL LOW (ref 101–111)
Creatinine, Ser: 0.67 mg/dL (ref 0.61–1.24)
GFR calc Af Amer: >60 mL/min (ref >60)
Glucose, Bld: 108 mg/dL — ABNORMAL HIGH (ref 65–99)
POTASSIUM: 4.1 mmol/L (ref 3.5–5.1)
SODIUM: 133 mmol/L — AB (ref 135–145)

## 2015-08-17 LAB — RAPID HIV SCREEN (HIV 1/2 AB+AG)
HIV 1/2 Antibodies: NONREACTIVE
HIV-1 P24 Antigen - HIV24: NONREACTIVE

## 2015-08-17 LAB — LIPID PANEL
Cholesterol: 138 mg/dL (ref 0–200)
HDL: 41 mg/dL (ref >40)
LDL Cholesterol: 87 mg/dL (ref 0–99)
TRIGLYCERIDES: 52 mg/dL (ref <150)
Total CHOL/HDL Ratio: 3.4 RATIO
VLDL: 10 mg/dL (ref 0–40)

## 2015-08-17 LAB — TSH: TSH: 3.15 u[IU]/mL (ref 0.350–4.500)

## 2015-08-17 LAB — VITAMIN B12: Vitamin B-12: 772 pg/mL (ref 180–914)

## 2015-08-17 MED ORDER — ACETAMINOPHEN 650 MG RE SUPP: 650.0000 mg | Freq: Four times a day (QID) | RECTAL | Status: DC | PRN

## 2015-08-17 MED ORDER — QUETIAPINE FUMARATE 25 MG PO TABS: 12.5000 mg | ORAL_TABLET | Freq: Every day | ORAL | Status: DC

## 2015-08-17 MED ORDER — SODIUM CHLORIDE 0.9 % IV SOLN
INTRAVENOUS | Status: DC
  Administered 2015-08-17 – 2015-08-18 (×2): via INTRAVENOUS

## 2015-08-17 MED ORDER — FAMOTIDINE IN NACL 20-0.9 MG/50ML-% IV SOLN
20.0000 mg | Freq: Two times a day (BID) | INTRAVENOUS | Status: DC
  Administered 2015-08-17 – 2015-08-19 (×4): 20 mg via INTRAVENOUS
  Filled 2015-08-17 (×6): qty 50

## 2015-08-17 MED ORDER — ENOXAPARIN SODIUM 40 MG/0.4ML ~~LOC~~ SOLN
40.0000 mg | SUBCUTANEOUS | Status: DC
  Administered 2015-08-17 – 2015-08-19 (×3): 40 mg via SUBCUTANEOUS
  Filled 2015-08-17 (×3): qty 0.4

## 2015-08-17 MED ORDER — CLOPIDOGREL BISULFATE 75 MG PO TABS: 75.0000 mg | ORAL_TABLET | Freq: Every day | ORAL | Status: DC

## 2015-08-17 MED ORDER — STROKE: EARLY STAGES OF RECOVERY BOOK
Freq: Once | Status: AC
  Administered 2015-08-17: 03:00:00

## 2015-08-17 MED ORDER — SENNOSIDES-DOCUSATE SODIUM 8.6-50 MG PO TABS
1.0000 | ORAL_TABLET | Freq: Every evening | ORAL | Status: DC | PRN
  Administered 2015-08-21: 1 via ORAL
  Filled 2015-08-17: qty 1

## 2015-08-17 MED ORDER — ACETAMINOPHEN 325 MG PO TABS
650.0000 mg | ORAL_TABLET | Freq: Four times a day (QID) | ORAL | Status: DC | PRN
Start: 2015-08-17 — End: 2015-08-22
  Administered 2015-08-19 – 2015-08-22 (×3): 650 mg via ORAL
  Filled 2015-08-17 (×3): qty 2

## 2015-08-17 MED ORDER — FLUTICASONE PROPIONATE 50 MCG/ACT NA SUSP
1.0000 | Freq: Every day | NASAL | Status: DC
  Administered 2015-08-17 – 2015-08-22 (×6): 1 via NASAL
  Filled 2015-08-17: qty 16

## 2015-08-17 MED ORDER — ASPIRIN EC 81 MG PO TBEC
81.0000 mg | DELAYED_RELEASE_TABLET | Freq: Every day | ORAL | Status: DC
Start: 2015-08-17 — End: 2015-08-22
  Administered 2015-08-17 – 2015-08-22 (×6): 81 mg via ORAL
  Filled 2015-08-17 (×6): qty 1

## 2015-08-17 MED ORDER — DOCUSATE SODIUM 100 MG PO CAPS
100.0000 mg | ORAL_CAPSULE | Freq: Two times a day (BID) | ORAL | Status: DC | PRN
  Administered 2015-08-21 – 2015-08-22 (×2): 100 mg via ORAL
  Filled 2015-08-17 (×2): qty 1

## 2015-08-17 MED ORDER — ENOXAPARIN SODIUM 40 MG/0.4ML ~~LOC~~ SOLN: 40.0000 mg | SUBCUTANEOUS | Status: DC

## 2015-08-17 MED ORDER — CALCIUM CARBONATE ANTACID 500 MG PO CHEW
2.0000 | CHEWABLE_TABLET | Freq: Every day | ORAL | Status: DC
Start: 2015-08-17 — End: 2015-08-22
  Administered 2015-08-18 – 2015-08-22 (×5): 400 mg via ORAL
  Filled 2015-08-17 (×5): qty 2

## 2015-08-17 NOTE — H&P (Signed)
[facility] History and Physical  [name] [name]:[id number] DOB: March 27, 1921 DOA: 08/16/2015  Referring physician:  PCP: [name], MD   Chief Complaint: Patient had visual deficit, he had trouble tracking tonight  HPI: [name] [name] is a 82 y.o. male past medical history significant for hyperlipidemia, coronary artery disease status post CABG, prior history of TIA and peripheral artery disease only on a baby aspirin [facility] hospital because patient was complaining of some trouble with eye tracking tonight he was able to look towards the left. Was thought that the patient was very lethargic, he had drooping eyelids. He had some strokelike symptoms. He also reports that since this evening he's been coughing some frothy clear sputum. Family denied any fever, chills or sweating. He chronically has lower extremity weakness which they attributed to peripheral artery disease otherwise has not noticed any other focal neurologic deficit. He does complain of some right arm weakness from a pulled muscle but has good sensation otherwise. Patient reports he is able to see fine but just had trouble with his eye muscles.  In the emergency room patient's vitals are stable, he's been afebrile. His CBC was benign, metabolic panel shows a mildly decreased serum sodium of 133. Underwent a chest x-ray which showed no evidence of vascular congestion or consolidation, and a CT of his head without contrast which showed no acute intracranial abnormality. EKG currently pending. Patient given 162 mg of aspirin at hospitals called to admit.  MEDS  CODE STATUS  REVIEW FMHX, SHX  Review of Systems:  Constitutional:  No weight loss, night sweats, Fevers, chills, does have some generalized fatigue.  HEENT: Patient had inability to look towards his left, otherwise was having normal vision. No headaches, occasionally does have some difficulty with coughing after thin liquids, denied Tooth/dental problems,Sore  throat, Cardio-vascular:  No chest pain, Orthopnea, PND, swelling in lower extremities, anasarca, dizziness, palpitations  GI:  No heartburn, indigestion, abdominal pain, nausea, vomiting, diarrhea, change in bowel habits, loss of appetite  Resp: Patient reported a cough productive of some whitish sputum  No shortness of breath with exertion or at rest. Skin:  no rash or lesions.  GU:  no dysuria, change in color of urine, no urgency or frequency. No flank pain.  Musculoskeletal:  Patient reported weakness right upper extremity from my pulled muscle, some decreased range of motion No joint pain or swelling. . No back pain.  Psych:  No change in mood or affect. No depression or anxiety. No memory loss.  Neuro: Trouble with vision as above in history of present illness No change in sensation  All other systems were reviewed and are negative.  Past Medical History  Diagnosis Date  . CAD (coronary artery disease)     nuclear 01/2008, no ischemia, EF 60%, distal anterior scar  . Hyperlipidemia   . Syncope     .remote..no etiology  /  syncope 11/2008.. from acute leg pain  . Venous insufficiency     support hose  . Carotid artery disease (HCC)     doppler 05/2009,  0-39% bilateral / doppler 06/2010 OK  . Elevated CPK     11/23/08.Marland KitchenMarland Kitchenprobably from debris from popliteal aneurysm  . Popliteal aneurysm (HCC)     .repair..11/25/2008  Dr. Darrick Penna  . Speech problem     Neurologic event with brief infusion and speech difficulty.... January, 2011.... being assessed by neurology  February, 2011  . Hx of CABG     1998  . Ejection fraction  EF 45%, echo,05/2009,apical hypo,  ?? posterior and lateral hypo  . Prominent abdominal aortic pulsation     doppler normal  . Intracranial hemorrhage (HCC)     Two small subependymal bleeds 06/2010,,ASA held  . Bradycardia     06/2010  . Edema     Ankle edema August, 2012  . TIA (transient ischemic attack)     Brief episode of confusion and speech  difficulty, January, 2012  /  recurrent confusion speech difficulty September, 2012  . PAD (peripheral artery disease) (HCC)     Above the knee to below the knee popliteal bypass,, right  //    Doppler,  July, 2013, patent  . Fall at home July 2015   Past Surgical History  Procedure Laterality Date  . Coronary artery bypass graft  1998  . Popliteal bypass  with reversed greater saphenous vein from right leg.     Social History:  reports that he quit smoking about 68 years ago. His smoking use included Cigarettes. He quit after 20 years of use. He has never used smokeless tobacco. He reports that he does not drink alcohol or use illicit drugs.  Allergies  Allergen Reactions  . Penicillins Hives    Has patient had a PCN reaction causing immediate rash, facial/tongue/throat swelling, SOB or lightheadedness with hypotension: Yes Has patient had a PCN reaction causing severe rash involving mucus membranes or skin necrosis: Yes Has patient had a PCN reaction that required hospitalization No Has patient had a PCN reaction occurring within the last 10 years: No If all of the above answers are "NO", then may proceed with Cephalosporin use.   . Latex Rash  . Sulfonamide Derivatives Rash    Family History  Problem Relation Age of Onset  . Diabetes Sister   . Stroke Mother   . Diabetes Mother      Prior to Admission medications   Medication Sig Start Date End Date Taking? Authorizing Provider  aspirin EC 81 MG tablet Take 81 mg by mouth daily.   Yes Historical Provider, MD  calcium carbonate (TUMS - DOSED IN MG ELEMENTAL CALCIUM) 500 MG chewable tablet Chew 2 tablets by mouth daily.   Yes Historical Provider, MD  docusate sodium (COLACE) 100 MG capsule Take 100 mg by mouth 2 (two) times daily as needed for mild constipation.   Yes Historical Provider, MD  Loratadine (CLARITIN REDITABS) 5 MG TBDP Take by mouth 2 (two) times daily as needed.   Yes Historical Provider, MD  Multiple Vitamin  (MULTIVITAMIN WITH MINERALS) TABS tablet Take 1 tablet by mouth daily.   Yes Historical Provider, MD   Physical Exam: Filed Vitals:   08/16/15 2100 08/16/15 2130 08/16/15 2330 08/17/15 0031  BP: 145/98 140/91 135/81 148/82  Pulse: 85 86    Temp:      TempSrc:      Resp: 23  23 15   Weight:      SpO2: 98% 94% 99% 98%    Wt Readings from Last 3 Encounters:  08/16/15 95.255 kg (210 lb)  08/03/15 95.255 kg (210 lb)  05/04/15 96.163 kg (212 lb)    General:  Appears calm and comfortable Eyes:  PERRL, EOMI, normal lids, iris ENT:  grossly normal hearing, lips & tongue Neck:  no LAD, masses or thyromegaly Cardiovascular:  RRR, no m/r/g. No LE edema.  Respiratory:  CTA bilaterally, no w/r/r. Normal respiratory effort. Abdomen:  soft, ntnd Skin:  no rash or induration seen on limited exam Musculoskeletal: Preserved  strength bilateral upper extremity is, has weakness and falls to gravity bilateral lower extremities Psychiatric:  grossly normal mood and affect, speech fluent and appropriate Neurologic: Patient with inability to look towards his left CN 2-12 grossly intact, moves all extremities in coordinated fashion, does have a little bit of problems with finger past-pointing, has lower extremity weakness as above .          Labs on Admission:  Basic Metabolic Panel:  Recent Labs Lab 08/16/15 1929  NA 133*  K 4.2  CL 97*  CO2 25  GLUCOSE 101*  BUN 16  CREATININE 0.71  CALCIUM 9.1   Liver Function Tests: No results for input(s): AST, ALT, ALKPHOS, BILITOT, PROT, ALBUMIN in the last 168 hours. No results for input(s): LIPASE, AMYLASE in the last 168 hours. No results for input(s): AMMONIA in the last 168 hours. CBC:  Recent Labs Lab 08/16/15 1929  WBC 8.4  HGB 13.3  HCT 39.5*  MCV 89.7  PLT 163   Cardiac Enzymes: No results for input(s): CKTOTAL, CKMB, CKMBINDEX, TROPONINI in the last 168 hours.  BNP (last 3 results)  Recent Labs  08/03/15 1125  BNP 154.0*      ProBNP (last 3 results) No results for input(s): PROBNP in the last 8760 hours.   CREATININE: 0.71 (08/16/15 1929) Estimated creatinine clearance - 70.9 mL/min  CBG: No results for input(s): GLUCAP in the last 168 hours.  Radiological Exams on Admission: Dg Chest 2 View  08/16/2015  CLINICAL DATA:  Cough EXAM: CHEST  2 VIEW COMPARISON:  08/03/2015 FINDINGS: Cardiomediastinal silhouette is stable. Again noted moderate gaseous distension of the stomach. Status post CABG. Osteopenia and mild degenerative change thoracic spine. No infiltrate or pulmonary edema. IMPRESSION: No active disease. Again noted moderate gaseous distension of the stomach. Electronically Signed   By: Natasha Mead M.D.   On: 08/16/2015 20:14   Ct Head Wo Contrast  08/16/2015  CLINICAL DATA:  Sudden blindness in the left eye. EXAM: CT HEAD WITHOUT CONTRAST TECHNIQUE: Contiguous axial images were obtained from the base of the skull through the vertex without intravenous contrast. COMPARISON:  08/03/2015 FINDINGS: Stable ventriculomegaly. There is stable low-density in the periventricular white matter. Evidence for mild cerebral atrophy. No evidence for acute hemorrhage, mass lesion, midline shift or large infarct. Mild mucosal disease in the right ethmoid air cells. Otherwise, the visualized paranasal sinuses are clear. Mastoid air cells are clear. No calvarial fracture. IMPRESSION: No acute intracranial abnormality. Stable ventriculomegaly. Stable mild atrophy and evidence for chronic small vessel ischemic changes. Electronically Signed   By: Richarda Overlie M.D.   On: 08/16/2015 20:08    EKG: Not performed in ER, currently pending  Assessment/Plan Active Problems:   TIA (transient ischemic attack)   1. TIA. Patient appears to have some difficulty with looking towards his left. CT of his head was negative for any acute intracranial abnormality. I will follow-up with an MRI and MRA to evaluate vasculature. Ordered an  echocardiogram, monitor on telemetry. Have PT OT and speech therapy seen and evaluated. Was previously on baby aspirin, will switch to Plavix of a 5 mg daily.  2. Indigestion. When necessary Tums.  3. Hyponatremia. Mild, did not complain of any dizziness. I'll provide him with gentle IV hydration and repeat BMP in the morning.  4. Cough. Chest x-ray without any acute consolidation. He's afebrile without a fever. Daughter concerned about the possibility of bronchitis but don't see any indication for an biotics at this time. We'll  follow-up swallow evaluation. May be aspirating.  DVT prophylaxis. Lovenox daily.  Code Status: Full code  DVT Prophylaxis: Lovenox 40 mg daily Family Communication: Daughter at the bedside and is in agreement. Disposition Plan: Pending Improvement    Joella PrinceJames Janssen Zee, MD Family Medicine [facility] [email or web address] Password [email or web address]

## 2015-08-17 NOTE — Consult Note (Signed)
Reason for Consult:Loss of vision Referring Physician: [place]  CC: Loss of vision  HPI: [name] [name] is an 82 y.o. male who is nonamblatory and has a caregiver at home.  Per report of daughter is reasonably cognitively intact but needs help to dress, bathe and use the bathroom.  Is incontinent.  Can feed himself.  On yesterday was somewhat lethargic which is not particularly unusual for him.  When his daughter came home at 83 she noted that his eyes were not quite right.  Unclear onset of symptoms.  He was brought in for evaluation at that time.  Appears to have difficulty with vision but reports that he is able to see fine.  Reports some past history of diplopia.       Past Medical History  Diagnosis Date  . CAD (coronary artery disease)     nuclear 01/2008, no ischemia, EF 60%, distal anterior scar  . Hyperlipidemia   . Syncope     .remote..no etiology  /  syncope 11/2008.. from acute leg pain  . Venous insufficiency     support hose  . Carotid artery disease ([place])     doppler 05/2009,  0-39% bilateral / doppler 06/2010 OK  . Elevated CPK     11/23/08.Marland KitchenMarland Kitchenprobably from debris from popliteal aneurysm  . Popliteal aneurysm ([place])     .repair..11/25/2008  Dr. Oneida Alar  . Speech problem     Neurologic event with brief infusion and speech difficulty.... January, 2011.... being assessed by neurology  February, 2011  . Hx of CABG     1998  . Ejection fraction     EF 45%, echo,05/2009,apical hypo,  ?? posterior and lateral hypo  . Prominent abdominal aortic pulsation     doppler normal  . Intracranial hemorrhage (HCC)     Two small subependymal bleeds 06/2010,,ASA held  . Bradycardia     06/2010  . Edema     Ankle edema August, 2012  . TIA (transient ischemic attack)     Brief episode of confusion and speech difficulty, January, 2012  /  recurrent confusion speech difficulty September, 2012  . PAD (peripheral artery disease) (HCC)     Above the knee to below the knee popliteal bypass,, right   //    Doppler,  July, 2013, patent  . Fall at home July 2015    Past Surgical History  Procedure Laterality Date  . Coronary artery bypass graft  1998  . Popliteal bypass  with reversed greater saphenous vein from right leg.      Family History  Problem Relation Age of Onset  . Diabetes Sister   . Stroke Mother   . Diabetes Mother     Social History:  reports that he quit smoking about 68 years ago. His smoking use included Cigarettes. He quit after 20 years of use. He has never used smokeless tobacco. He reports that he does not drink alcohol or use illicit drugs.  Allergies  Allergen Reactions  . Penicillins Hives    Has patient had a PCN reaction causing immediate rash, facial/tongue/throat swelling, SOB or lightheadedness with hypotension: Yes Has patient had a PCN reaction causing severe rash involving mucus membranes or skin necrosis: Yes Has patient had a PCN reaction that required hospitalization No Has patient had a PCN reaction occurring within the last 10 years: No If all of the above answers are "NO", then may proceed with Cephalosporin use.   . Latex Rash  . Sulfonamide Derivatives Rash  Medications:  I have reviewed the patient's current medications. Prior to Admission:  Prescriptions prior to admission  Medication Sig Dispense Refill Last Dose  . aspirin EC 81 MG tablet Take 81 mg by mouth daily.   08/16/2015 at Unknown time  . calcium carbonate (TUMS - DOSED IN MG ELEMENTAL CALCIUM) 500 MG chewable tablet Chew 2 tablets by mouth daily.   08/16/2015 at Unknown time  . docusate sodium (COLACE) 100 MG capsule Take 100 mg by mouth 2 (two) times daily as needed for mild constipation.   prn at prn  . Loratadine (CLARITIN REDITABS) 5 MG TBDP Take by mouth 2 (two) times daily as needed.   prn at prn  . Multiple Vitamin (MULTIVITAMIN WITH MINERALS) TABS tablet Take 1 tablet by mouth daily.   08/16/2015 at Unknown time   Scheduled: . aspirin EC  81 mg Oral Daily  .  calcium carbonate  2 tablet Oral Daily  . enoxaparin (LOVENOX) injection  40 mg Subcutaneous Q24H  . famotidine (PEPCID) IV  20 mg Intravenous Q12H  . fluticasone  1 spray Each Nare Daily  . QUEtiapine  12.5 mg Oral QHS    ROS: History obtained from daughter  General ROS: negative for - chills, fatigue, fever, night sweats, weight gain or weight loss Psychological ROS: negative for - behavioral disorder, hallucinations, memory difficulties, mood swings or suicidal ideation Ophthalmic ROS: negative for - blurry vision, double vision, eye pain or loss of vision ENT ROS: negative for - epistaxis, nasal discharge, oral lesions, sore throat, tinnitus or vertigo Allergy and Immunology ROS: negative for - hives or itchy/watery eyes Hematological and Lymphatic ROS: negative for - bleeding problems, bruising or swollen lymph nodes Endocrine ROS: negative for - galactorrhea, hair pattern changes, polydipsia/polyuria or temperature intolerance Respiratory ROS: cough Cardiovascular ROS: leg swelling Gastrointestinal ROS: negative for - abdominal pain, diarrhea, hematemesis, nausea/vomiting or stool incontinence Genito-Urinary ROS: negative for - dysuria, hematuria, incontinence or urinary frequency/urgency Musculoskeletal ROS: negative for - joint swelling or muscular weakness Neurological ROS: as noted in HPI Dermatological ROS: negative for rash and skin lesion changes  Physical Examination: Blood pressure 142/74, pulse 77, temperature 98 F (36.7 C), temperature source Oral, resp. rate 18, height 5' 11"  (1.803 m), weight 94.008 kg (207 lb 4 oz), SpO2 97 %.  HEENT-  Normocephalic, no lesions, without obvious abnormality.  Normal external eye and conjunctiva.  Normal TM's bilaterally.  Normal auditory canals and external ears. Normal external nose, mucus membranes and septum.  Normal pharynx. Cardiovascular- S1, S2 normal, pulses palpable throughout   Lungs- chest clear, no wheezing, rales,  normal symmetric air entry Abdomen- soft, non-tender; bowel sounds normal; no masses,  no organomegaly Extremities- BLE edema Lymph-no adenopathy palpable Musculoskeletal-no joint tenderness, deformity or swelling Skin-warm and dry, no hyperpigmentation, vitiligo, or suspicious lesions  Neurological Examination Mental Status: Alert.  Speech fluent without evidence of aphasia.  Able to follow commands without difficulty.  Questionable left neglect.   Cranial Nerves: II: Discs flat bilaterally; When asked if he can see reports that he can see and looks to the ceiling reporting there is a chair and refrigerator.  When formal vision testing done the responses are inconsistent.  It does appear that to see he requires visual contrast since he is able to count fingers better when my hand is gloved.  Can count fingers with forward gaze.  Decrased vision peripherally to the right.  Possibly unable o see at all from the left eye other than centrally.  Pupils equal, round, reactive to light and accommodation III,IV, VI: ptosis not present, extra-ocular motions intact bilaterally with some mild decrease in lateral excursion with the left eye.   V,VII: mild left facial droop, facial light touch sensation normal bilaterally VIII: hearing normal bilaterally IX,X: gag reflex present XI: bilateral shoulder shrug XII: midline tongue extension Motor: Right : Upper extremity   5/5    Left:     Upper extremity   4+/5 Unable to lift either leg off the bed.  Lower extremities  edematous Sensory: Pinprick and light touch intact decreased in the LUE Deep Tendon Reflexes: 1+ in the upper extremities.  Unable to test in the lower extremities due to hose Plantars: Right: mute   Left: mute Cerebellar: Normal finger to nose testing.   Gait: not tested due to safety concerns    Laboratory Studies:   Basic Metabolic Panel:  Recent Labs Lab 08/16/15 1929 08/17/15 0240  NA 133* 133*  K 4.2 4.1  CL 97* 97*  CO2  25 27  GLUCOSE 101* 108*  BUN 16 14  CREATININE 0.71 0.67  CALCIUM 9.1 8.9    Liver Function Tests: No results for input(s): AST, ALT, ALKPHOS, BILITOT, PROT, ALBUMIN in the last 168 hours. No results for input(s): LIPASE, AMYLASE in the last 168 hours. No results for input(s): AMMONIA in the last 168 hours.  CBC:  Recent Labs Lab 08/16/15 1929 08/17/15 0240  WBC 8.4 7.4  HGB 13.3 13.2  HCT 39.5* 38.9*  MCV 89.7 90.1  PLT 163 156    Cardiac Enzymes: No results for input(s): CKTOTAL, CKMB, CKMBINDEX, TROPONINI in the last 168 hours.  BNP: Invalid input(s): POCBNP  CBG: No results for input(s): GLUCAP in the last 168 hours.  Microbiology: Results for orders placed or performed during the hospital encounter of 06/26/10  Culture, blood (routine x 2)     Status: None   Collection Time: 06/26/10  7:01 PM  Result Value Ref Range Status   Specimen Description BLOOD ARM LEFT  Final   Special Requests BOTTLES DRAWN AEROBIC ONLY [facility]  Final   Culture  Setup Time [id number]  Final   Culture NO GROWTH 5 DAYS  Final   Report Status 07/03/2010 FINAL  Final  Culture, blood (routine x 2)     Status: None   Collection Time: 06/26/10  7:08 PM  Result Value Ref Range Status   Specimen Description BLOOD ARM RIGHT  Final   Special Requests BOTTLES DRAWN AEROBIC ONLY [facility]  Final   Culture  Setup Time [id number]  Final   Culture NO GROWTH 5 DAYS  Final   Report Status 07/03/2010 FINAL  Final  Urine culture     Status: None   Collection Time: 06/26/10  9:44 PM  Result Value Ref Range Status   Specimen Description URINE, CATHETERIZED  Final   Special Requests NONE  Final   Culture  Setup Time [id number]  Final   Colony Count NO GROWTH  Final   Culture NO GROWTH  Final   Report Status 06/27/2010 FINAL  Final    Coagulation Studies: No results for input(s): LABPROT, INR in the last 72 hours.  Urinalysis:  Recent Labs Lab 08/16/15 2146  COLORURINE YELLOW*  LABSPEC 1.016   PHURINE 6.0  GLUCOSEU NEGATIVE  HGBUR NEGATIVE  BILIRUBINUR NEGATIVE  KETONESUR NEGATIVE  PROTEINUR NEGATIVE  NITRITE NEGATIVE  LEUKOCYTESUR NEGATIVE    Lipid Panel:     Component Value Date/Time   CHOL 138 08/17/2015  0240   CHOL 134 06/14/2015 1558   TRIG 52 08/17/2015 0240   HDL 41 08/17/2015 0240   HDL 40 06/14/2015 1558   CHOLHDL 3.4 08/17/2015 0240   CHOLHDL 3.4 06/14/2015 1558   VLDL 10 08/17/2015 0240   LDLCALC 87 08/17/2015 0240   LDLCALC 71 06/14/2015 1558    HgbA1C:  Lab Results  Component Value Date   HGBA1C * 06/26/2010    6.0 (NOTE)                                                                       According to the ADA Clinical Practice Recommendations for 2011, when HbA1c is used as a screening test:   >=6.5%   Diagnostic of Diabetes Mellitus           (if abnormal result  is confirmed)  5.7-6.4%   Increased risk of developing Diabetes Mellitus  References:Diagnosis and Classification of Diabetes Mellitus,Diabetes YDXA,1287,86([facility] 1):S62-S69 and Standards of Medical Care in         Diabetes - 2011,Diabetes Care,2011,34  (Suppl 1):S11-S61.    Urine Drug Screen:  No results found for: LABOPIA, COCAINSCRNUR, LABBENZ, AMPHETMU, THCU, LABBARB  Alcohol Level: No results for input(s): ETH in the last 168 hours.  Other results: EKG: normal sinus rhythm at 82 bpm.  Imaging: Dg Chest 2 View  08/16/2015  CLINICAL DATA:  Cough EXAM: CHEST  2 VIEW COMPARISON:  08/03/2015 FINDINGS: Cardiomediastinal silhouette is stable. Again noted moderate gaseous distension of the stomach. Status post CABG. Osteopenia and mild degenerative change thoracic spine. No infiltrate or pulmonary edema. IMPRESSION: No active disease. Again noted moderate gaseous distension of the stomach. Electronically Signed   By: Lahoma Crocker M.D.   On: 08/16/2015 20:14   Ct Head Wo Contrast  08/16/2015  CLINICAL DATA:  Sudden blindness in the left eye. EXAM: CT HEAD WITHOUT CONTRAST TECHNIQUE:  Contiguous axial images were obtained from the base of the skull through the vertex without intravenous contrast. COMPARISON:  08/03/2015 FINDINGS: Stable ventriculomegaly. There is stable low-density in the periventricular white matter. Evidence for mild cerebral atrophy. No evidence for acute hemorrhage, mass lesion, midline shift or large infarct. Mild mucosal disease in the right ethmoid air cells. Otherwise, the visualized paranasal sinuses are clear. Mastoid air cells are clear. No calvarial fracture. IMPRESSION: No acute intracranial abnormality. Stable ventriculomegaly. Stable mild atrophy and evidence for chronic small vessel ischemic changes. Electronically Signed   By: Markus Daft M.D.   On: 08/16/2015 20:08   Mr Brain Wo Contrast  08/17/2015  CLINICAL DATA:  82 year old male with stroke like symptoms, visual problems looking to the left. Initial encounter. EXAM: MRI HEAD WITHOUT CONTRAST MRA HEAD WITHOUT CONTRAST TECHNIQUE: Multiplanar, multiecho pulse sequences of the brain and surrounding structures were obtained without intravenous contrast. Angiographic images of the head were obtained using MRA technique without contrast. COMPARISON:  Head CT without contrast 08/16/2015. [facility] Brain MRI and intracranial MRA 06/27/2010. FINDINGS: MRI HEAD FINDINGS Major intracranial vascular flow voids are stable. No restricted diffusion to suggest acute infarction. No midline shift, mass effect, evidence of mass lesion, extra-axial collection or acute intracranial hemorrhage. Cervicomedullary junction and pituitary are within normal limits. Stable cerebral volume. Stable ventricular prominence without  acute ventriculomegaly. Patchy periventricular T2 and FLAIR hyperintensity has not significantly changed. Regressed hemosiderin in the right lateral ventricle since the 2012 comparison. No other chronic cerebral blood products identified. T2 heterogeneity throughout the deep gray matter nuclei has not  significantly changed an appears to be in large part due to perivascular spaces. Patchy T2 hyperintensity in the pons has not significantly changed. Stable and negative visualized internal auditory structures. Small volume retained secretions in the nasopharynx. Stable and well pneumatized visualized paranasal sinuses and mastoids. Orbits soft tissues appear stable and normal aside from postoperative changes to the right globe. Negative scalp soft tissues. Negative for age visualized cervical spine. MRA HEAD FINDINGS Stable antegrade flow in the posterior circulation. MOTSA artifact suspected in the lower posterior fossa. No distal vertebral artery stenosis suspected. Bilateral PICA flow is visible. The vertebrobasilar junction and basilar artery remain normal without stenosis. SCA and PCA origins remain normal. Normal left posterior communicating artery, the right is diminutive or absent. Bilateral PCA branches are stable and within normal limits. Antegrade flow in both ICA siphons. No siphon stenosis. Ophthalmic and left posterior communicating artery origins remain normal. Carotid termini remain patent. MCA and ACA origins remain normal. Anterior communicating artery and visualized ACA branches are stable and within normal limits. Visualized bilateral MCA branches are stable and normal aside from mild tortuosity. IMPRESSION: 1.  No acute intracranial abnormality. 2. Expected evolution of the small volume intraventricular hemorrhage on the right in 2012. Otherwise stable noncontrast MRI appearance the brain, including chronic signal changes felt to be small vessel disease related. 3. Stable and negative intracranial MRA. Electronically Signed   By: Genevie Ann M.D.   On: 08/17/2015 10:21   US Carotid Bilateral  08/17/2015  CLINICAL DATA:  CVA. EXAM: BILATERAL CAROTID DUPLEX ULTRASOUND TECHNIQUE: Pearline Cables scale imaging, color Doppler and duplex ultrasound were performed of bilateral carotid and vertebral arteries in the  neck. COMPARISON:  MRI 08/17/2015 . FINDINGS: Criteria: Quantification of carotid stenosis is based on velocity parameters that correlate the residual internal carotid diameter with NASCET-based stenosis levels, using the diameter of the distal internal carotid lumen as the denominator for stenosis measurement. The following velocity measurements were obtained: RIGHT ICA:  72/12 cm/sec CCA:  44/03 cm/sec SYSTOLIC ICA/CCA RATIO:  0.8 DIASTOLIC ICA/CCA RATIO:  1.1 ECA:  59 cm/sec LEFT ICA:  57/14 cm/sec CCA:  47/42 cm/sec SYSTOLIC ICA/CCA RATIO:  0.8 DIASTOLIC ICA/CCA RATIO:  1.1 ECA:  34 cm/sec RIGHT CAROTID ARTERY: No significant carotid atherosclerotic vascular disease. No flow limiting stenosis. RIGHT VERTEBRAL ARTERY:  Patent with antegrade flow. LEFT CAROTID ARTERY: Mild left carotid bifurcation proximal ICA atherosclerotic vascular plaque. No flow limiting stenosis. LEFT VERTEBRAL ARTERY:  Patent with antegrade flow. IMPRESSION: 1. Mild left carotid bifurcation proximal ICA calcified atherosclerotic vascular plaque. No flow limiting stenosis. Degree of stenosis less than 50%. Right carotid widely patent. 2.  Vertebral arteries are patent with antegrade flow. Electronically Signed   By: Marcello Moores  Register   On: 08/17/2015 10:48   Mr Jodene Nam Head/brain Wo Cm  08/17/2015  CLINICAL DATA:  82 year old male with stroke like symptoms, visual problems looking to the left. Initial encounter. EXAM: MRI HEAD WITHOUT CONTRAST MRA HEAD WITHOUT CONTRAST TECHNIQUE: Multiplanar, multiecho pulse sequences of the brain and surrounding structures were obtained without intravenous contrast. Angiographic images of the head were obtained using MRA technique without contrast. COMPARISON:  Head CT without contrast 08/16/2015. [facility] Brain MRI and intracranial MRA 06/27/2010. FINDINGS: MRI HEAD FINDINGS Major intracranial  vascular flow voids are stable. No restricted diffusion to suggest acute infarction. No midline shift,  mass effect, evidence of mass lesion, extra-axial collection or acute intracranial hemorrhage. Cervicomedullary junction and pituitary are within normal limits. Stable cerebral volume. Stable ventricular prominence without acute ventriculomegaly. Patchy periventricular T2 and FLAIR hyperintensity has not significantly changed. Regressed hemosiderin in the right lateral ventricle since the 2012 comparison. No other chronic cerebral blood products identified. T2 heterogeneity throughout the deep gray matter nuclei has not significantly changed an appears to be in large part due to perivascular spaces. Patchy T2 hyperintensity in the pons has not significantly changed. Stable and negative visualized internal auditory structures. Small volume retained secretions in the nasopharynx. Stable and well pneumatized visualized paranasal sinuses and mastoids. Orbits soft tissues appear stable and normal aside from postoperative changes to the right globe. Negative scalp soft tissues. Negative for age visualized cervical spine. MRA HEAD FINDINGS Stable antegrade flow in the posterior circulation. MOTSA artifact suspected in the lower posterior fossa. No distal vertebral artery stenosis suspected. Bilateral PICA flow is visible. The vertebrobasilar junction and basilar artery remain normal without stenosis. SCA and PCA origins remain normal. Normal left posterior communicating artery, the right is diminutive or absent. Bilateral PCA branches are stable and within normal limits. Antegrade flow in both ICA siphons. No siphon stenosis. Ophthalmic and left posterior communicating artery origins remain normal. Carotid termini remain patent. MCA and ACA origins remain normal. Anterior communicating artery and visualized ACA branches are stable and within normal limits. Visualized bilateral MCA branches are stable and normal aside from mild tortuosity. IMPRESSION: 1.  No acute intracranial abnormality. 2. Expected evolution of the small  volume intraventricular hemorrhage on the right in 2012. Otherwise stable noncontrast MRI appearance the brain, including chronic signal changes felt to be small vessel disease related. 3. Stable and negative intracranial MRA. Electronically Signed   By: Genevie Ann M.D.   On: 08/17/2015 10:21     Assessment/Plan: 82 year old male presenting with what appears to be a visual confabulation, left neglect and left upper extremity weakness.  Although symptoms suggestive of a cerebral event, MRI of the brain personally reviewed and show no acute changes.  Exam is quite confusing though and changes each time performed due to confabulation.  Further work up recommended.  Patient on ASA daily.    Recommendations: 1.  Ophthalmology consult 2.  B12, ESR,CRP, heavy metal screen, RPR, copper 3.  EEG 4.  If above work up unremarkable may need to repeat imaging.     Alexis Goodell, MD Neurology [phone] 08/17/2015, 3:19 PM

## 2015-08-17 NOTE — Progress Notes (Signed)
Nurse called me because pt vision is getting worse on left side.spoke to  ophthalomolgist  DR.Synetta FailAnita .she said she will talk to family.

## 2015-08-17 NOTE — Progress Notes (Signed)
Luberta MutterKonidena made aware that patients vision has slightly worsened. Ophthalmology paged to [place].   Ophthalmology spoke to patients daughter via telephone and to see patient tomorrow at approx. noon.   Patients daughter remains at bedside and is concerned. Patient is more confused - disoriented to time. Oncoming RN made aware. Bo McclintockBrewer,Joelly Bolanos S, RN

## 2015-08-17 NOTE — Progress Notes (Signed)
*  PRELIMINARY RESULTS* Echocardiogram 2D Echocardiogram has been performed.  Georgann HousekeeperJerry R Hege 08/17/2015, 2:24 PM

## 2015-08-17 NOTE — Care Management Obs Status (Signed)
MEDICARE OBSERVATION STATUS NOTIFICATION   Patient Details  Name: [name] [name] MRN: [id number] Date of Birth: 05/20/1921   Medicare Observation Status Notification Given:  Yes    Gwenette GreetBrenda S Mclane Arora, RN 08/17/2015, 2:55 PM

## 2015-08-17 NOTE — Progress Notes (Signed)
OT Cancellation Note  Patient Details Name: [name] [name] MRN: [id number] DOB: 07/27/1921   Cancelled Treatment:    Reason Eval/Treat Not Completed: Patient's level of consciousness. Spoke to PT and NSG after completing chart review and pt is not able to attend to full evaluation at this time due to decreased AMS and visual field loss.  Will attempt again tomorrow.  Susanne BordersSusan Dezirae Service, OTR/L ascom [phone] 08/17/2015, 2:58 PM

## 2015-08-17 NOTE — Progress Notes (Signed)
Patients daughter is concerned regarding patients vision. Explained to daughter that Ophthalmology has been consulted and must see patient within 24 hours. Daughter feels that patient should be seen sooner. On call MD paged. Bo McclintockBrewer,Obryan Radu S, RN

## 2015-08-17 NOTE — Care Management (Addendum)
Admitted to [facility] with the diagnosis of TIA. Daughter is Arma HeadingRenee Downing [phone]). Last visit at Dr. Christell Faithrissman's office was 06/14/15. Private paid caregivers 24/7. Life Alert, wheelchair, rolling walker, bedside commode and safety rails in the home. No skilled nursing facilities. Home Health through [facility] in the past. No falls. Good appetite. [place] prescriptions [facility] at [facility] [place] (Takes no medications). Daughters will transport. Gwenette GreetBrenda S Evann Erazo RN MSN CCM Care Management [phone]

## 2015-08-17 NOTE — ED Notes (Signed)
Dr Sullivan in to see pt 

## 2015-08-17 NOTE — Progress Notes (Signed)
PT Cancellation Note  Patient Details Name: [name] [name] L [place] MRN: [id number] DOB: 11/13/1921   Cancelled Treatment:    Reason Eval/Treat Not Completed: Patient's level of consciousness. Attempted PT evaluation. Daughter in room assists with subjective information. The patient is oriented to person only, and unable to correctly respond to any questions regarding visual stimuli (what color is my shirt? How many fingers am I holding up? Reach forward and touch my hand.) Pt also demonstrating difficulty bringing finger to nose when cued bilat. Will hold until AMS is improved and pt is better able to participate. Will attempt again at later date/time.     2:30 PM, 08/17/2015 Rosamaria LintsAllan C Buccola, PT, DPT PRN Physical Therapist - [facility] License # [id number] [phone](ASCOM)  [phone] (mobile)

## 2015-08-17 NOTE — Progress Notes (Signed)
Patient ID: [name] [name], male   DOB: 07-14-1921, 82 y.o.   MRN: [id number] [facility] PROGRESS NOTE  [name] [name]:[id number] DOB: October 26, 1921 DOA: 08/16/2015 PCP: [name], MD  HPI/Subjective: Brought in family for not acting right. Patient having difficulty looking to the left. Last night didn't sleep much because he was brought in through the ER. Cough which is continuous.  Objective: Filed Vitals:   08/17/15 0500 08/17/15 0752  BP: 147/94 142/74  Pulse: 85 77  Temp: 98.5 F (36.9 C) 98 F (36.7 C)  Resp: 20 18    Filed Weights   08/16/15 1914 08/17/15 0300  Weight: 95.255 kg (210 lb) 94.008 kg (207 lb 4 oz)    ROS: Review of Systems  Constitutional: Negative for fever and chills.  Eyes: Negative for blurred vision.  Respiratory: Positive for cough. Negative for shortness of breath.   Cardiovascular: Negative for chest pain.  Gastrointestinal: Negative for nausea, vomiting, abdominal pain, diarrhea and constipation.  Genitourinary: Negative for dysuria.  Musculoskeletal: Negative for joint pain.  Neurological: Negative for dizziness and headaches.   Exam: Physical Exam  HENT:  Nose: No mucosal edema.  Mouth/Throat: No oropharyngeal exudate or posterior oropharyngeal edema.  Eyes: Conjunctivae, EOM and lids are normal. Pupils are equal, round, and reactive to light.  Neck: No JVD present. Carotid bruit is not present. No edema present. No thyroid mass and no thyromegaly present.  Cardiovascular: S1 normal and S2 normal.  Exam reveals no gallop.   Murmur heard.  Systolic murmur is present with a grade of 2/6  Pulses:      Dorsalis pedis pulses are 1+ on the right side, and 1+ on the left side.  Respiratory: No respiratory distress. He has no wheezes. He has no rhonchi. He has no rales.  GI: Soft. Bowel sounds are normal. There is no tenderness.  Musculoskeletal:       Right ankle: He exhibits swelling.       Left ankle: He exhibits swelling.   Lymphadenopathy:    He has no cervical adenopathy.  Neurological: He is alert.  Difficulty looking to the left. Periods where he is a little more lethargic but periods where he answers questions.  Skin: Skin is warm. No rash noted. Nails show no clubbing.  Psychiatric: He has a normal mood and affect.      Data Reviewed: Basic Metabolic Panel:  Recent Labs Lab 08/16/15 1929 08/17/15 0240  NA 133* 133*  K 4.2 4.1  CL 97* 97*  CO2 25 27  GLUCOSE 101* 108*  BUN 16 14  CREATININE 0.71 0.67  CALCIUM 9.1 8.9   CBC:  Recent Labs Lab 08/16/15 1929 08/17/15 0240  WBC 8.4 7.4  HGB 13.3 13.2  HCT 39.5* 38.9*  MCV 89.7 90.1  PLT 163 156   BNP (last 3 results)  Recent Labs  08/03/15 1125  BNP 154.0*      Studies: Dg Chest 2 View  08/16/2015  CLINICAL DATA:  Cough EXAM: CHEST  2 VIEW COMPARISON:  08/03/2015 FINDINGS: Cardiomediastinal silhouette is stable. Again noted moderate gaseous distension of the stomach. Status post CABG. Osteopenia and mild degenerative change thoracic spine. No infiltrate or pulmonary edema. IMPRESSION: No active disease. Again noted moderate gaseous distension of the stomach. Electronically Signed   By: Natasha Mead M.D.   On: 08/16/2015 20:14   Ct Head Wo Contrast  08/16/2015  CLINICAL DATA:  Sudden blindness in the left eye. EXAM: CT HEAD WITHOUT CONTRAST TECHNIQUE:  Contiguous axial images were obtained from the base of the skull through the vertex without intravenous contrast. COMPARISON:  08/03/2015 FINDINGS: Stable ventriculomegaly. There is stable low-density in the periventricular white matter. Evidence for mild cerebral atrophy. No evidence for acute hemorrhage, mass lesion, midline shift or large infarct. Mild mucosal disease in the right ethmoid air cells. Otherwise, the visualized paranasal sinuses are clear. Mastoid air cells are clear. No calvarial fracture. IMPRESSION: No acute intracranial abnormality. Stable ventriculomegaly. Stable  mild atrophy and evidence for chronic small vessel ischemic changes. Electronically Signed   By: Richarda OverlieAdam  Henn M.D.   On: 08/16/2015 20:08   Mr Brain Wo Contrast  08/17/2015  CLINICAL DATA:  82 year old male with stroke like symptoms, visual problems looking to the left. Initial encounter. EXAM: MRI HEAD WITHOUT CONTRAST MRA HEAD WITHOUT CONTRAST TECHNIQUE: Multiplanar, multiecho pulse sequences of the brain and surrounding structures were obtained without intravenous contrast. Angiographic images of the head were obtained using MRA technique without contrast. COMPARISON:  Head CT without contrast [facility]16[facility]2015[facility]. FINDINGS: MRI HEAD FINDINGS Major intracranial vascular flow voids are stable. No restricted diffusion to suggest acute infarction. No midline shift, mass effect, evidence of mass lesion, extra-axial collection or acute intracranial hemorrhage. Cervicomedullary junction and pituitary are within normal limits. Stable cerebral volume. Stable ventricular prominence without acute ventriculomegaly. Patchy periventricular T2 and FLAIR hyperintensity has not significantly changed. Regressed hemosiderin in the right lateral ventricle since the 2012 comparison. No other chronic cerebral blood products identified. T2 heterogeneity throughout the deep gray matter nuclei has not significantly changed an appears to be in large part due to perivascular spaces. Patchy T2 hyperintensity in the pons has not significantly changed. Stable and negative visualized internal auditory structures. Small volume retained secretions in the nasopharynx. Stable and well pneumatized visualized paranasal sinuses and mastoids. Orbits soft tissues appear stable and normal aside from postoperative changes to the right globe. Negative scalp soft tissues. Negative for age visualized cervical spine. MRA HEAD FINDINGS Stable antegrade flow in the posterior circulation. MOTSA artifact  suspected in the lower posterior fossa. No distal vertebral artery stenosis suspected. Bilateral PICA flow is visible. The vertebrobasilar junction and basilar artery remain normal without stenosis. SCA and PCA origins remain normal. Normal left posterior communicating artery, the right is diminutive or absent. Bilateral PCA branches are stable and within normal limits. Antegrade flow in both ICA siphons. No siphon stenosis. Ophthalmic and left posterior communicating artery origins remain normal. Carotid termini remain patent. MCA and ACA origins remain normal. Anterior communicating artery and visualized ACA branches are stable and within normal limits. Visualized bilateral MCA branches are stable and normal aside from mild tortuosity. IMPRESSION: 1.  No acute intracranial abnormality. 2. Expected evolution of the small volume intraventricular hemorrhage on the right in 2012. Otherwise stable noncontrast MRI appearance the brain, including chronic signal changes felt to be small vessel disease related. 3. Stable and negative intracranial MRA. Electronically Signed   By: Odessa FlemingH  Hall M.D.   On: 08/17/2015 10:21   [place] Carotid Bilateral  08/17/2015  CLINICAL DATA:  CVA. EXAM: BILATERAL CAROTID DUPLEX ULTRASOUND TECHNIQUE: Wallace CullensGray scale imaging, color Doppler and duplex ultrasound were performed of bilateral carotid and vertebral arteries in the neck. COMPARISON:  MRI 08/17/2015 . FINDINGS: Criteria: Quantification of carotid stenosis is based on velocity parameters that correlate the residual internal carotid diameter with NASCET-based stenosis levels, using the diameter of the distal internal carotid lumen as the denominator for stenosis  measurement. The following velocity measurements were obtained: RIGHT ICA:  72/12 cm/sec CCA:  89/12 cm/sec SYSTOLIC ICA/CCA RATIO:  0.8 DIASTOLIC ICA/CCA RATIO:  1.1 ECA:  59 cm/sec LEFT ICA:  57/14 cm/sec CCA:  73/12 cm/sec SYSTOLIC ICA/CCA RATIO:  0.8 DIASTOLIC ICA/CCA RATIO:  1.1 ECA:   34 cm/sec RIGHT CAROTID ARTERY: No significant carotid atherosclerotic vascular disease. No flow limiting stenosis. RIGHT VERTEBRAL ARTERY:  Patent with antegrade flow. LEFT CAROTID ARTERY: Mild left carotid bifurcation proximal ICA atherosclerotic vascular plaque. No flow limiting stenosis. LEFT VERTEBRAL ARTERY:  Patent with antegrade flow. IMPRESSION: 1. Mild left carotid bifurcation proximal ICA calcified atherosclerotic vascular plaque. No flow limiting stenosis. Degree of stenosis less than 50%. Right carotid widely patent. 2.  Vertebral arteries are patent with antegrade flow. Electronically Signed   By: Maisie Fus  Register   On: 08/17/2015 10:48   Mr Maxine Glenn Head/brain Wo Cm  08/17/2015  CLINICAL DATA:  82 year old male with stroke like symptoms, visual problems looking to the left. Initial encounter. EXAM: MRI HEAD WITHOUT CONTRAST MRA HEAD WITHOUT CONTRAST TECHNIQUE: Multiplanar, multiecho pulse sequences of the brain and surrounding structures were obtained without intravenous contrast. Angiographic images of the head were obtained using MRA technique without contrast. COMPARISON:  Head CT without contrast 08/16/2015. [facility] Brain MRI and intracranial MRA 06/27/2010. FINDINGS: MRI HEAD FINDINGS Major intracranial vascular flow voids are stable. No restricted diffusion to suggest acute infarction. No midline shift, mass effect, evidence of mass lesion, extra-axial collection or acute intracranial hemorrhage. Cervicomedullary junction and pituitary are within normal limits. Stable cerebral volume. Stable ventricular prominence without acute ventriculomegaly. Patchy periventricular T2 and FLAIR hyperintensity has not significantly changed. Regressed hemosiderin in the right lateral ventricle since the 2012 comparison. No other chronic cerebral blood products identified. T2 heterogeneity throughout the deep gray matter nuclei has not significantly changed an appears to be in large part due to  perivascular spaces. Patchy T2 hyperintensity in the pons has not significantly changed. Stable and negative visualized internal auditory structures. Small volume retained secretions in the nasopharynx. Stable and well pneumatized visualized paranasal sinuses and mastoids. Orbits soft tissues appear stable and normal aside from postoperative changes to the right globe. Negative scalp soft tissues. Negative for age visualized cervical spine. MRA HEAD FINDINGS Stable antegrade flow in the posterior circulation. MOTSA artifact suspected in the lower posterior fossa. No distal vertebral artery stenosis suspected. Bilateral PICA flow is visible. The vertebrobasilar junction and basilar artery remain normal without stenosis. SCA and PCA origins remain normal. Normal left posterior communicating artery, the right is diminutive or absent. Bilateral PCA branches are stable and within normal limits. Antegrade flow in both ICA siphons. No siphon stenosis. Ophthalmic and left posterior communicating artery origins remain normal. Carotid termini remain patent. MCA and ACA origins remain normal. Anterior communicating artery and visualized ACA branches are stable and within normal limits. Visualized bilateral MCA branches are stable and normal aside from mild tortuosity. IMPRESSION: 1.  No acute intracranial abnormality. 2. Expected evolution of the small volume intraventricular hemorrhage on the right in 2012. Otherwise stable noncontrast MRI appearance the brain, including chronic signal changes felt to be small vessel disease related. 3. Stable and negative intracranial MRA. Electronically Signed   By: Odessa Fleming M.D.   On: 08/17/2015 10:21    Scheduled Meds: . aspirin EC  81 mg Oral Daily  . calcium carbonate  2 tablet Oral Daily  . enoxaparin (LOVENOX) injection  40 mg Subcutaneous Q24H  . famotidine (PEPCID)  IV  20 mg Intravenous Q12H  . fluticasone  1 spray Each Nare Daily  . QUEtiapine  12.5 mg Oral QHS    Continuous Infusions: . sodium chloride 30 mL/hr at 08/17/15 1300    Assessment/Plan:  1. Acute encephalopathy, difficulty looking to the left. MRI and MRA of the brain did not show anything acute. I will have neurology see the patient just in case there is a cranial nerve palsy. Does not seem to be any source of infection. I recently saw the patient in the ER for a slumping over episode and sent him home from the ER. Speech therapy place the patient on a diet. 2. Cough. Had a recent Z-Pak. Chest x-ray no signs of infection. I will give Pepcid for possible acid reflux and Flonase nasal spray for possible postnasal drip. 3. Lower extremity edema. Family has been hesitant before about diuretic. I will decrease the IV fluid hydration at this point. 4. Patient did not sleep last night and we'll give a dose of circled tonight. 5. History of CAD on aspirin 6. Deconditioning. We'll get physical therapy evaluation 7. Nurse noted some blood on the bed. We'll send off another urine analysis  Code Status:     Code Status Orders        Start     Ordered   08/17/15 0113  Full code   Continuous     08/17/15 0114    Code Status History    Date Active Date Inactive Code Status Order ID Comments User Context   08/17/2015  1:14 AM  Full Code [id number]  Joella Prince, MD ED    Advance Directive Documentation        Most Recent Value   Type of Advance Directive  Healthcare Power of Attorney   Pre-existing out of facility DNR order (yellow form or pink MOST form)     "MOST" Form in Place?       Family Communication: Spoke with daughter at the bedside Disposition Plan: To be determined  Consultants:  Neurology  Time spent: 38 minutes  Alford Highland  [employer]

## 2015-08-17 NOTE — Evaluation (Signed)
Clinical/Bedside Swallow Evaluation Patient Details  Name: [name] [name] MRN: [id number] Date of Birth: Sep 26, 1921  Today's Date: 08/17/2015 Time: SLP Start Time (ACUTE ONLY): 1245 SLP Stop Time (ACUTE ONLY): 1345 SLP Time Calculation (min) (ACUTE ONLY): 60 min  Past Medical History:  Past Medical History  Diagnosis Date  . CAD (coronary artery disease)     nuclear 01/2008, no ischemia, EF 60%, distal anterior scar  . Hyperlipidemia   . Syncope     .remote..no etiology  /  syncope 11/2008.. from acute leg pain  . Venous insufficiency     support hose  . Carotid artery disease (HCC)     doppler 05/2009,  0-39% bilateral / doppler 06/2010 OK  . Elevated CPK     11/23/08.Marland KitchenMarland Kitchenprobably from debris from popliteal aneurysm  . Popliteal aneurysm (HCC)     .repair..11/25/2008  Dr. Darrick Penna  . Speech problem     Neurologic event with brief infusion and speech difficulty.... January, 2011.... being assessed by neurology  February, 2011  . Hx of CABG     1998  . Ejection fraction     EF 45%, echo,05/2009,apical hypo,  ?? posterior and lateral hypo  . Prominent abdominal aortic pulsation     doppler normal  . Intracranial hemorrhage (HCC)     Two small subependymal bleeds 06/2010,,ASA held  . Bradycardia     06/2010  . Edema     Ankle edema August, 2012  . TIA (transient ischemic attack)     Brief episode of confusion and speech difficulty, January, 2012  /  recurrent confusion speech difficulty September, 2012  . PAD (peripheral artery disease) (HCC)     Above the knee to below the knee popliteal bypass,, right  //    Doppler,  July, 2013, patent  . Fall at home July 2015   Past Surgical History:  Past Surgical History  Procedure Laterality Date  . Coronary artery bypass graft  1998  . Popliteal bypass  with reversed greater saphenous vein from right leg.     HPI:  Pt is a 82 y.o. male past medical history significant for hyperlipidemia, coronary artery disease status post CABG, prior  history of TIA w/ resulting confusion and speech issues in 2011/2012, reduced EF, and peripheral artery disease only on a baby aspirin. He presented to the hospital d/t complaining of some trouble with eye tracking. He had drooping eyelids. He also reports that since this evening he's been coughing some frothy clear sputum. Family denied any fever, chills or sweating. He chronically has lower extremity weakness which they attributed to peripheral artery disease otherwise has not noticed any other focal neurologic deficit. Patient having difficulty looking to the left this morning w/ MD but did not sleep during the night d/t being the ED all night per report. A cough has been reported; Dtr stated pt intermittently "has trouble" when drinking thin liquids during meals but stated "it is not frequent or constant". MD reported dx of Acute encephalopathy, difficulty looking to the left. MRI and MRA of the brain did not show anything acute; there is atrophy and chronic small vessel disease changes per scan. Pt was verbally responsive; speech clear. Noted he kept his eyes closed more often during the assessment but w/ cues, he opened and participated in feeding himself. He indicated his wants and needs re: the food and followed general commands w/ min cues given for stimulation. He helped to feed himself but required moderate support. Unsure of pt's baseline Cognitive  status. Dtr reported pt ate "quickly" not chewing all of his foods thoroughly; he is missing some dentition.    Assessment / Plan / Recommendation Clinical Impression  Pt appears to present w/ adequate oropharyngeal phase swallow function; no overt s/s of aspiration noted during po trials of thin liquids and soft/moistened consistency food. No oral phase deficits noted during bolus management of these trials but suspect meats would be more difficult to fully break down d/t missing molars, effort. Pt fed self w/ setup and support/assistance w/ drink and  bites of puree. Unsure of pt's basleine Cognitive status as he needed increased support and redirection during tasks. Recommend a Dys. 3 (mech soft - cut meats) diet w/ thin liquids; general aspiration precautions; cut any tough meats/foods and add gravy to moisten well d/t missing few dentition; meds given w/ puree IF any trouble swallowing w/ liquids. NSG updated and will reconsult ST services if any decline or change in status while admitted.     Aspiration Risk   (reduced)    Diet Recommendation  Dysphagia 3(cut meats moistened); thin liquids; general aspiration precautions including reducing distractions during meals; support w/ tray setup and feeding as needed during meals.   Medication Administration: Whole meds with puree (as needed for easier swallowing)    Other  Recommendations Recommended Consults:  (none at this time) Oral Care Recommendations: Oral care BID;Staff/trained caregiver to provide oral care   Follow up Recommendations  None (currently)    Frequency and Duration            Prognosis Prognosis for Safe Diet Advancement: Good Barriers to Reach Goals:  (potential Cognitive decline currently)      Swallow Study   General Date of Onset: 08/16/15 HPI: Pt is a 82 y.o. male past medical history significant for hyperlipidemia, coronary artery disease status post CABG, prior history of TIA w/ resulting confusion and speech issues in 2011/2012, reduced EF, and peripheral artery disease only on a baby aspirin. He presented to the hospital d/t complaining of some trouble with eye tracking. He had drooping eyelids. He also reports that since this evening he's been coughing some frothy clear sputum. Family denied any fever, chills or sweating. He chronically has lower extremity weakness which they attributed to peripheral artery disease otherwise has not noticed any other focal neurologic deficit. Patient having difficulty looking to the left this morning w/ MD but did not sleep  during the night d/t being the ED all night per report. A cough has been reported; Dtr stated pt intermittently "has trouble" when drinking thin liquids during meals but stated "it is not frequent or constant". MD reported dx of Acute encephalopathy, difficulty looking to the left. MRI and MRA of the brain did not show anything acute; there is atrophy and chronic small vessel disease changes per scan. Pt was verbally responsive; speech clear. Noted he kept his eyes closed more often during the assessment but w/ cues, he opened and participated in feeding himself. He indicated his wants and needs re: the food and followed general commands w/ min cues given for stimulation. He helped to feed himself but required moderate support. Unsure of pt's baseline Cognitive status. Dtr reported pt ate "quickly" not chewing all of his foods thoroughly; he is missing some dentition.  Type of Study: Bedside Swallow Evaluation Previous Swallow Assessment: none Diet Prior to this Study: Regular;Thin liquids Temperature Spikes Noted: No (not elevated) Respiratory Status: Room air History of Recent Intubation: No Behavior/Cognition: Alert;Cooperative;Pleasant mood;Confused;Distractible;Requires cueing Oral  Cavity Assessment: Dry Oral Care Completed by SLP: Yes Oral Cavity - Dentition: Missing dentition (molars) Vision: Functional for self-feeding (held cup presented and fed self; eyes closed often) Self-Feeding Abilities: Able to feed self;Needs assist;Needs set up Patient Positioning: Upright in bed Baseline Vocal Quality: Normal Volitional Cough: Strong Volitional Swallow: Able to elicit    Oral/Motor/Sensory Function Overall Oral Motor/Sensory Function: Within functional limits   Ice Chips Ice chips: Within functional limits Presentation: Spoon (fed; 2 trials)   Thin Liquid Thin Liquid: Within functional limits Presentation: Cup;Self Fed (w/ support; 6 trials)    Nectar Thick Nectar Thick Liquid: Not tested    Honey Thick Honey Thick Liquid: Not tested   Puree Puree: Within functional limits Presentation: Spoon;Self Fed (w/ support; 4 trials)   Solid   GO   Solid: Within functional limits (softened) Presentation: Self Fed (w/ support; 2 trials) Other Comments: pt to be seen for next test    Functional Assessment Tool Used: clinical judgement Functional Limitations: Swallowing Swallow Current Status ([id number]): At least 1 percent but less than 20 percent impaired, limited or restricted Swallow Goal Status [id number]([id number]): At least 1 percent but less than 20 percent impaired, limited or restricted Swallow Discharge Status [phone]([id number]): At least 1 percent but less than 20 percent impaired, limited or restricted    Jerilynn SomKatherine Watson, MS, CCC-SLP  Watson,Katherine 08/17/2015,1:58 PM

## 2015-08-18 ENCOUNTER — Inpatient Hospital Stay: Payer: Medicare Other

## 2015-08-18 ENCOUNTER — Observation Stay: Payer: Medicare Other

## 2015-08-18 DIAGNOSIS — H269 Unspecified cataract: Secondary | ICD-10-CM | POA: Diagnosis present

## 2015-08-18 DIAGNOSIS — Z951 Presence of aortocoronary bypass graft: Secondary | ICD-10-CM | POA: Diagnosis not present

## 2015-08-18 DIAGNOSIS — R6884 Jaw pain: Secondary | ICD-10-CM | POA: Diagnosis present

## 2015-08-18 DIAGNOSIS — Z7982 Long term (current) use of aspirin: Secondary | ICD-10-CM | POA: Diagnosis not present

## 2015-08-18 DIAGNOSIS — H539 Unspecified visual disturbance: Secondary | ICD-10-CM

## 2015-08-18 DIAGNOSIS — Z87891 Personal history of nicotine dependence: Secondary | ICD-10-CM | POA: Diagnosis not present

## 2015-08-18 DIAGNOSIS — I2510 Atherosclerotic heart disease of native coronary artery without angina pectoris: Secondary | ICD-10-CM | POA: Diagnosis present

## 2015-08-18 DIAGNOSIS — E785 Hyperlipidemia, unspecified: Secondary | ICD-10-CM | POA: Diagnosis present

## 2015-08-18 DIAGNOSIS — K30 Functional dyspepsia: Secondary | ICD-10-CM | POA: Diagnosis present

## 2015-08-18 DIAGNOSIS — Z8673 Personal history of transient ischemic attack (TIA), and cerebral infarction without residual deficits: Secondary | ICD-10-CM | POA: Diagnosis not present

## 2015-08-18 DIAGNOSIS — Z961 Presence of intraocular lens: Secondary | ICD-10-CM | POA: Diagnosis present

## 2015-08-18 DIAGNOSIS — R000 Tachycardia, unspecified: Secondary | ICD-10-CM | POA: Diagnosis present

## 2015-08-18 DIAGNOSIS — R600 Localized edema: Secondary | ICD-10-CM | POA: Diagnosis present

## 2015-08-18 DIAGNOSIS — G459 Transient cerebral ischemic attack, unspecified: Secondary | ICD-10-CM | POA: Diagnosis present

## 2015-08-18 DIAGNOSIS — R011 Cardiac murmur, unspecified: Secondary | ICD-10-CM | POA: Diagnosis present

## 2015-08-18 DIAGNOSIS — E876 Hypokalemia: Secondary | ICD-10-CM | POA: Diagnosis not present

## 2015-08-18 DIAGNOSIS — R4182 Altered mental status, unspecified: Secondary | ICD-10-CM

## 2015-08-18 DIAGNOSIS — G453 Amaurosis fugax: Secondary | ICD-10-CM | POA: Diagnosis not present

## 2015-08-18 DIAGNOSIS — R32 Unspecified urinary incontinence: Secondary | ICD-10-CM | POA: Diagnosis present

## 2015-08-18 DIAGNOSIS — Z833 Family history of diabetes mellitus: Secondary | ICD-10-CM | POA: Diagnosis not present

## 2015-08-18 DIAGNOSIS — I480 Paroxysmal atrial fibrillation: Secondary | ICD-10-CM | POA: Diagnosis present

## 2015-08-18 DIAGNOSIS — G9340 Encephalopathy, unspecified: Secondary | ICD-10-CM | POA: Diagnosis present

## 2015-08-18 DIAGNOSIS — I739 Peripheral vascular disease, unspecified: Secondary | ICD-10-CM | POA: Diagnosis present

## 2015-08-18 DIAGNOSIS — R05 Cough: Secondary | ICD-10-CM | POA: Diagnosis present

## 2015-08-18 DIAGNOSIS — J209 Acute bronchitis, unspecified: Secondary | ICD-10-CM | POA: Diagnosis present

## 2015-08-18 DIAGNOSIS — Z823 Family history of stroke: Secondary | ICD-10-CM | POA: Diagnosis not present

## 2015-08-18 DIAGNOSIS — H538 Other visual disturbances: Secondary | ICD-10-CM | POA: Diagnosis present

## 2015-08-18 DIAGNOSIS — E871 Hypo-osmolality and hyponatremia: Secondary | ICD-10-CM | POA: Diagnosis present

## 2015-08-18 LAB — CBC
HEMATOCRIT: 36.8 % — AB (ref 40.0–52.0)
HEMOGLOBIN: 12.6 g/dL — AB (ref 13.0–18.0)
MCH: 30.8 pg (ref 26.0–34.0)
MCHC: 34.3 g/dL (ref 32.0–36.0)
MCV: 89.8 fL (ref 80.0–100.0)
Platelets: 136 10*3/uL — ABNORMAL LOW (ref 150–440)
RBC: 4.1 MIL/uL — AB (ref 4.40–5.90)
RDW: 14 % (ref 11.5–14.5)
WBC: 7.1 10*3/uL (ref 3.8–10.6)

## 2015-08-18 LAB — BASIC METABOLIC PANEL
Anion gap: 8 (ref 5–15)
BUN: 11 mg/dL (ref 6–20)
CHLORIDE: 99 mmol/L — AB (ref 101–111)
CO2: 24 mmol/L (ref 22–32)
Calcium: 8.2 mg/dL — ABNORMAL LOW (ref 8.9–10.3)
Creatinine, Ser: 0.65 mg/dL (ref 0.61–1.24)
GFR calc Af Amer: >60 mL/min (ref >60)
GFR calc non Af Amer: >60 mL/min (ref >60)
GLUCOSE: 111 mg/dL — AB (ref 65–99)
POTASSIUM: 3.6 mmol/L (ref 3.5–5.1)
SODIUM: 131 mmol/L — AB (ref 135–145)

## 2015-08-18 LAB — HEAVY METALS, BLOOD
Arsenic: 7 ug/L (ref 2–23)
Lead: 2 ug/dL (ref 0–19)
Mercury: NOT DETECTED ug/L (ref 0.0–14.9)

## 2015-08-18 LAB — CERULOPLASMIN: Ceruloplasmin: 29.5 mg/dL (ref 16.0–31.0)

## 2015-08-18 LAB — RPR: RPR Ser Ql: NONREACTIVE

## 2015-08-18 MED ORDER — GADOBENATE DIMEGLUMINE 529 MG/ML IV SOLN
20.0000 mL | Freq: Once | INTRAVENOUS | Status: AC | PRN
  Administered 2015-08-18: 20 mL via INTRAVENOUS

## 2015-08-18 NOTE — Progress Notes (Signed)
Subjective: Patient reports that his vision is improved.  No complaints of pain   Objective: Current vital signs: BP 142/70 mmHg  Pulse 72  Temp(Src) 98.8 F (37.1 C) (Oral)  Resp 20  Ht 5\' 11"  (1.803 m)  Wt 94.008 kg (207 lb 4 oz)  BMI 28.92 kg/m2  SpO2 95% Vital signs in last 24 hours: Temp:  [98 F (36.7 C)-99.3 F (37.4 C)] 98.8 F (37.1 C) (06/16 0443) Pulse Rate:  [67-80] 72 (06/16 0443) Resp:  [16-20] 20 (06/16 0443) BP: (123-142)/(51-72) 142/70 mmHg (06/16 0443) SpO2:  [94 %-99 %] 95 % (06/16 0443)  Intake/Output from previous day: 06/15 0701 - 06/16 0700 In: 432.3 [I.V.:432.3] Out: -  Intake/Output this shift: Total I/O In: 360 [P.O.:360] Out: -  Nutritional status: DIET DYS 3 Room service appropriate?: Yes with Assist; Fluid consistency:: Thin  Neurologic Exam: Mental Status: Alert. Speech fluent without evidence of aphasia. Able to follow commands without difficulty.  Cranial Nerves: II: Discs flat bilaterally; Able to count fingers on forward gaze.  With right eye able to count fingers in all visual fields.  With left eye continues to have visual deficits in the temporal visual field.  Pupils equal, round, reactive to light and accommodation III,IV, VI: ptosis not present, extra-ocular motions intact bilaterally with some mild decrease in lateral excursion with the left eye.  V,VII: mild left facial droop, facial light touch sensation normal bilaterally VIII: hearing normal bilaterally IX,X: gag reflex present XI: bilateral shoulder shrug XII: midline tongue extension Motor: Right :Upper extremity 5/5Left: Upper extremity 5-/5 Unable to lift either leg off the bed. Lower extremities edematous   Lab Results: Basic Metabolic Panel:  Recent Labs Lab 08/16/15 1929 08/17/15 0240 08/18/15 0508  NA 133* 133* 131*  K 4.2 4.1 3.6  CL 97* 97* 99*  CO2 25 27 24   GLUCOSE 101* 108* 111*  BUN 16 14 11    CREATININE 0.71 0.67 0.65  CALCIUM 9.1 8.9 8.2*    Liver Function Tests: No results for input(s): AST, ALT, ALKPHOS, BILITOT, PROT, ALBUMIN in the last 168 hours. No results for input(s): LIPASE, AMYLASE in the last 168 hours. No results for input(s): AMMONIA in the last 168 hours.  CBC:  Recent Labs Lab 08/16/15 1929 08/17/15 0240 08/18/15 0508  WBC 8.4 7.4 7.1  HGB 13.3 13.2 12.6*  HCT 39.5* 38.9* 36.8*  MCV 89.7 90.1 89.8  PLT 163 156 136*    Cardiac Enzymes: No results for input(s): CKTOTAL, CKMB, CKMBINDEX, TROPONINI in the last 168 hours.  Lipid Panel:  Recent Labs Lab 08/17/15 0240  CHOL 138  TRIG 52  HDL 41  CHOLHDL 3.4  VLDL 10  LDLCALC 87    CBG: No results for input(s): GLUCAP in the last 168 hours.  Microbiology: Results for orders placed or performed during the hospital encounter of 06/26/10  Culture, blood (routine x 2)     Status: None   Collection Time: 06/26/10  7:01 PM  Result Value Ref Range Status   Specimen Description BLOOD ARM LEFT  Final   Special Requests BOTTLES DRAWN AEROBIC ONLY [facility]8CC  Final   Culture  Setup Time [id number]  Final   Culture NO GROWTH 5 DAYS  Final   Report Status 07/03/2010 FINAL  Final  Culture, blood (routine x 2)     Status: None   Collection Time: 06/26/10  7:08 PM  Result Value Ref Range Status   Specimen Description BLOOD ARM RIGHT  Final   Special  Requests BOTTLES DRAWN AEROBIC ONLY [facility]  Final   Culture  Setup Time [id number]  Final   Culture NO GROWTH 5 DAYS  Final   Report Status 07/03/2010 FINAL  Final  Urine culture     Status: None   Collection Time: 06/26/10  9:44 PM  Result Value Ref Range Status   Specimen Description URINE, CATHETERIZED  Final   Special Requests NONE  Final   Culture  Setup Time [id number]  Final   Colony Count NO GROWTH  Final   Culture NO GROWTH  Final   Report Status 06/27/2010 FINAL  Final    Coagulation Studies: No results for input(s): LABPROT, INR in the  last 72 hours.  Imaging: Dg Chest 2 View  08/16/2015  CLINICAL DATA:  Cough EXAM: CHEST  2 VIEW COMPARISON:  08/03/2015 FINDINGS: Cardiomediastinal silhouette is stable. Again noted moderate gaseous distension of the stomach. Status post CABG. Osteopenia and mild degenerative change thoracic spine. No infiltrate or pulmonary edema. IMPRESSION: No active disease. Again noted moderate gaseous distension of the stomach. Electronically Signed   By: Natasha Mead M.D.   On: 08/16/2015 20:14   Ct Head Wo Contrast  08/16/2015  CLINICAL DATA:  Sudden blindness in the left eye. EXAM: CT HEAD WITHOUT CONTRAST TECHNIQUE: Contiguous axial images were obtained from the base of the skull through the vertex without intravenous contrast. COMPARISON:  08/03/2015 FINDINGS: Stable ventriculomegaly. There is stable low-density in the periventricular white matter. Evidence for mild cerebral atrophy. No evidence for acute hemorrhage, mass lesion, midline shift or large infarct. Mild mucosal disease in the right ethmoid air cells. Otherwise, the visualized paranasal sinuses are clear. Mastoid air cells are clear. No calvarial fracture. IMPRESSION: No acute intracranial abnormality. Stable ventriculomegaly. Stable mild atrophy and evidence for chronic small vessel ischemic changes. Electronically Signed   By: Richarda Overlie M.D.   On: 08/16/2015 20:08   Mr Brain Wo Contrast  08/17/2015  CLINICAL DATA:  82 year old male with stroke like symptoms, visual problems looking to the left. Initial encounter. EXAM: MRI HEAD WITHOUT CONTRAST MRA HEAD WITHOUT CONTRAST TECHNIQUE: Multiplanar, multiecho pulse sequences of the brain and surrounding structures were obtained without intravenous contrast. Angiographic images of the head were obtained using MRA technique without contrast. COMPARISON:  Head CT without contrast 08/16/2015. [facility] Brain MRI and intracranial MRA 06/27/2010. FINDINGS: MRI HEAD FINDINGS Major intracranial vascular  flow voids are stable. No restricted diffusion to suggest acute infarction. No midline shift, mass effect, evidence of mass lesion, extra-axial collection or acute intracranial hemorrhage. Cervicomedullary junction and pituitary are within normal limits. Stable cerebral volume. Stable ventricular prominence without acute ventriculomegaly. Patchy periventricular T2 and FLAIR hyperintensity has not significantly changed. Regressed hemosiderin in the right lateral ventricle since the 2012 comparison. No other chronic cerebral blood products identified. T2 heterogeneity throughout the deep gray matter nuclei has not significantly changed an appears to be in large part due to perivascular spaces. Patchy T2 hyperintensity in the pons has not significantly changed. Stable and negative visualized internal auditory structures. Small volume retained secretions in the nasopharynx. Stable and well pneumatized visualized paranasal sinuses and mastoids. Orbits soft tissues appear stable and normal aside from postoperative changes to the right globe. Negative scalp soft tissues. Negative for age visualized cervical spine. MRA HEAD FINDINGS Stable antegrade flow in the posterior circulation. MOTSA artifact suspected in the lower posterior fossa. No distal vertebral artery stenosis suspected. Bilateral PICA flow is visible. The vertebrobasilar junction and basilar artery remain  normal without stenosis. SCA and PCA origins remain normal. Normal left posterior communicating artery, the right is diminutive or absent. Bilateral PCA branches are stable and within normal limits. Antegrade flow in both ICA siphons. No siphon stenosis. Ophthalmic and left posterior communicating artery origins remain normal. Carotid termini remain patent. MCA and ACA origins remain normal. Anterior communicating artery and visualized ACA branches are stable and within normal limits. Visualized bilateral MCA branches are stable and normal aside from mild  tortuosity. IMPRESSION: 1.  No acute intracranial abnormality. 2. Expected evolution of the small volume intraventricular hemorrhage on the right in 2012. Otherwise stable noncontrast MRI appearance the brain, including chronic signal changes felt to be small vessel disease related. 3. Stable and negative intracranial MRA. Electronically Signed   By: Odessa Fleming M.D.   On: 08/17/2015 10:21   US Carotid Bilateral  08/17/2015  CLINICAL DATA:  CVA. EXAM: BILATERAL CAROTID DUPLEX ULTRASOUND TECHNIQUE: Wallace Cullens scale imaging, color Doppler and duplex ultrasound were performed of bilateral carotid and vertebral arteries in the neck. COMPARISON:  MRI 08/17/2015 . FINDINGS: Criteria: Quantification of carotid stenosis is based on velocity parameters that correlate the residual internal carotid diameter with NASCET-based stenosis levels, using the diameter of the distal internal carotid lumen as the denominator for stenosis measurement. The following velocity measurements were obtained: RIGHT ICA:  72/12 cm/sec CCA:  89/12 cm/sec SYSTOLIC ICA/CCA RATIO:  0.8 DIASTOLIC ICA/CCA RATIO:  1.1 ECA:  59 cm/sec LEFT ICA:  57/14 cm/sec CCA:  73/12 cm/sec SYSTOLIC ICA/CCA RATIO:  0.8 DIASTOLIC ICA/CCA RATIO:  1.1 ECA:  34 cm/sec RIGHT CAROTID ARTERY: No significant carotid atherosclerotic vascular disease. No flow limiting stenosis. RIGHT VERTEBRAL ARTERY:  Patent with antegrade flow. LEFT CAROTID ARTERY: Mild left carotid bifurcation proximal ICA atherosclerotic vascular plaque. No flow limiting stenosis. LEFT VERTEBRAL ARTERY:  Patent with antegrade flow. IMPRESSION: 1. Mild left carotid bifurcation proximal ICA calcified atherosclerotic vascular plaque. No flow limiting stenosis. Degree of stenosis less than 50%. Right carotid widely patent. 2.  Vertebral arteries are patent with antegrade flow. Electronically Signed   By: Maisie Fus  Register   On: 08/17/2015 10:48   Mr Maxine Glenn Head/brain Wo Cm  08/17/2015  CLINICAL DATA:  82 year old male  with stroke like symptoms, visual problems looking to the left. Initial encounter. EXAM: MRI HEAD WITHOUT CONTRAST MRA HEAD WITHOUT CONTRAST TECHNIQUE: Multiplanar, multiecho pulse sequences of the brain and surrounding structures were obtained without intravenous contrast. Angiographic images of the head were obtained using MRA technique without contrast. COMPARISON:  Head CT without contrast 08/16/2015. [facility] Brain MRI and intracranial MRA 06/27/2010. FINDINGS: MRI HEAD FINDINGS Major intracranial vascular flow voids are stable. No restricted diffusion to suggest acute infarction. No midline shift, mass effect, evidence of mass lesion, extra-axial collection or acute intracranial hemorrhage. Cervicomedullary junction and pituitary are within normal limits. Stable cerebral volume. Stable ventricular prominence without acute ventriculomegaly. Patchy periventricular T2 and FLAIR hyperintensity has not significantly changed. Regressed hemosiderin in the right lateral ventricle since the 2012 comparison. No other chronic cerebral blood products identified. T2 heterogeneity throughout the deep gray matter nuclei has not significantly changed an appears to be in large part due to perivascular spaces. Patchy T2 hyperintensity in the pons has not significantly changed. Stable and negative visualized internal auditory structures. Small volume retained secretions in the nasopharynx. Stable and well pneumatized visualized paranasal sinuses and mastoids. Orbits soft tissues appear stable and normal aside from postoperative changes to the right globe. Negative scalp soft tissues.  Negative for age visualized cervical spine. MRA HEAD FINDINGS Stable antegrade flow in the posterior circulation. MOTSA artifact suspected in the lower posterior fossa. No distal vertebral artery stenosis suspected. Bilateral PICA flow is visible. The vertebrobasilar junction and basilar artery remain normal without stenosis. SCA and PCA  origins remain normal. Normal left posterior communicating artery, the right is diminutive or absent. Bilateral PCA branches are stable and within normal limits. Antegrade flow in both ICA siphons. No siphon stenosis. Ophthalmic and left posterior communicating artery origins remain normal. Carotid termini remain patent. MCA and ACA origins remain normal. Anterior communicating artery and visualized ACA branches are stable and within normal limits. Visualized bilateral MCA branches are stable and normal aside from mild tortuosity. IMPRESSION: 1.  No acute intracranial abnormality. 2. Expected evolution of the small volume intraventricular hemorrhage on the right in 2012. Otherwise stable noncontrast MRI appearance the brain, including chronic signal changes felt to be small vessel disease related. 3. Stable and negative intracranial MRA. Electronically Signed   By: Odessa Fleming M.D.   On: 08/17/2015 10:21    Medications:  I have reviewed the patient's current medications. Scheduled: . aspirin EC  81 mg Oral Daily  . calcium carbonate  2 tablet Oral Daily  . enoxaparin (LOVENOX) injection  40 mg Subcutaneous Q24H  . famotidine (PEPCID) IV  20 mg Intravenous Q12H  . fluticasone  1 spray Each Nare Daily  . QUEtiapine  12.5 mg Oral QHS    Assessment/Plan: Vision improved but continues to have some visual field deficits. EEG pending.  Hyponatremic.  Sedimentation rate only slightly elevated but CRP elevated at 2.0.  B12 normal.  Remaining labs pending.  Ophthalmology consult pending.  Will make determination for need for steroids at that time.      Thana Farr, MD Neurology [phone] 08/18/2015  10:13 AM

## 2015-08-18 NOTE — Progress Notes (Signed)
PT Cancellation Note  Patient Details Name: [name] [name] MRN: [id number] DOB: 04/24/1921   Cancelled Treatment:    Reason Eval/Treat Not Completed: Patient at procedure or test/unavailable Attempted X2 this AM, first pt was eating breakfast and wanted to hold PT.  On trying again he was out of room for procedure, will try back later.  [name] 08/18/2015, 9:20 AM

## 2015-08-18 NOTE — Evaluation (Signed)
Occupational Therapy Evaluation Patient Details Name: Darden Palmerlbert L Ficken MRN: [id number] DOB: 02/13/1922 Today's Date: 08/18/2015    History of Present Illness 82 y.o. male past medical history of hyperlipidemia, coronary artery disease status post CABG, prior history of TIA and peripheral artery disease only on a baby aspirin. Came to hospital because he was complaining of some trouble with eye tracking tonight he was unable to look towards the left. Pt with some stroke-like symptoms, CT and MRI negative.   Clinical Impression   Pt. Is a 82 y.o. Male who [facility] with [facility]IA and visual changes. Pt. Presents with lethargy, impaired vision, weakness, confusion, decreased functional mobility, and leaning to the left which hinder his ability to assist in and complete ADL and IADL functioning. Pt. Could benefit from skilled OT services to further assess visual perceptual functioning, review visual compensatory strategies during ADLs, therapeutic ex., functional mobility during ADLs, ADL training, positioning, as well as pt. Caregiver education.       Follow Up Recommendations  SNF    Equipment Recommendations       Recommendations for Other Services       Precautions / Restrictions Precautions Precautions: Fall Restrictions Weight Bearing Restrictions: No      Mobility Bed Mobility Overal bed mobility: Needs Assistance Bed Mobility: Supine to Sit     Supine to sit: Mod assist     General bed mobility comments: Pt shows great effort getting up to sitting EOB but needs considerable assist to rise and to be able to hold bed rails (secondary to vision issues)  Transfers         Balance Overall balance assessment: Needs assistance Sitting-balance support: Bilateral upper extremity supported Sitting balance-Leahy Scale: Fair: left sided leaning.                                  ADL Overall ADL's : Needs assistance/impaired                                        General ADL Comments: Pt. requires extensive assist with all ADLs. Pt. requires frequent cues for vision, cognition, and lethargy.     Vision Vision Assessment?: Vision impaired- to be further tested in functional context (Vision was difficult to assess secondary to lethargy, and limited attention.)   Perception     Praxis      Pertinent Vitals/Pain       Hand Dominance     Extremity/Trunk Assessment Upper Extremity Assessment Upper Extremity Assessment: Generalized weakness       Communication Communication Communication: HOH   Cognition Arousal/Alertness: Lethargic Behavior During Therapy: WFL for tasks assessed/performed Overall Cognitive Status: History of cognitive impairments - at baseline (family reports mental status is not too far from baseline)                     General Comments       Exercises      Shoulder Instructions      Home Living Family/patient expects to be discharged to:: Private residence Living Arrangements: Spouse/significant other;Children Available Help at Discharge: Available 24 hours/day Type of Home: House Home Access: Ramped entrance     Home Layout: One level     Bathroom Shower/Tub: Producer, television/film/videoWalk-in shower   Bathroom Toilet: Standard Bathroom Accessibility: Yes  Home Equipment: Walker - 2 wheels;Cane - single point;Bedside commode          Prior Functioning/Environment Level of Independence: Needs assistance    ADL's / Homemaking Assistance Needed: Pt. required assist with ADLs  at home. Pt. took sponge baths.   Comments: Pt has going to outpateint PT recently and started working on walking (max of ~50 ft) but has not been walking in the home for many months.    OT Diagnosis: Generalized weakness   OT Problem List: Decreased strength;Impaired vision/perception;Decreased safety awareness;Decreased activity tolerance;Decreased coordination;Decreased range of motion;Decreased knowledge of use  of DME or AE   OT Treatment/Interventions: Self-care/ADL training;Therapeutic exercise;Therapeutic activities;Neuromuscular education;Patient/family education;Visual/perceptual remediation/compensation    OT Goals(Current goals can be found in the care plan section) Acute Rehab OT Goals Patient Stated Goal: go home  OT Frequency: Min 1X/week   Barriers to D/C:            Co-evaluation              End of Session    Activity Tolerance: Patient limited by lethargy Patient left: in chair;with chair alarm set;with call bell/phone within reach;with family/visitor present   Time: 1345-1405 OT Time Calculation (min): 20 min Charges:  OT General Charges $OT Visit: 1 Procedure OT Evaluation $OT Eval Moderate Complexity: 1 Procedure G-Codes:    Olegario Messier, MS, OTR/L Olegario Messier 08/18/2015, 3:24 PM

## 2015-08-18 NOTE — Evaluation (Signed)
Physical Therapy Evaluation Patient Details Name: [name] [name] MRN: [id number] DOB: 06/27/1921 Today's Date: 08/18/2015   History of Present Illness  82 y.o. male past medical history of hyperlipidemia, coronary artery disease status post CABG, prior history of TIA and peripheral artery disease only on a baby aspirin. Came to hospital because he was complaining of some trouble with eye tracking he was unable to look towards the left. Pt with some stroke-like symptoms, CT and MRI negative.    Clinical Impression  Pt showed good effort with PT exam and family very involved and helpful.  He is limited with some lethargy (apparently mild confusion is not cognitively too far from baseline) but mostly due to his inability to see well.  Pt appears to be more limited than he thinks he is in this regard.  Overall he was able to do ~10 minutes of general LE exercises but was very limited with standing/ambulation as he took many, many small steps just to get from bed to recliner.  Pt will benefit from PT in a rehab setting to get back to ability to function with 24 hour assist at home.     Follow Up Recommendations SNF    Equipment Recommendations       Recommendations for Other Services       Precautions / Restrictions Precautions Precautions: Fall Restrictions Weight Bearing Restrictions: No      Mobility  Bed Mobility Overal bed mobility: Needs Assistance Bed Mobility: Supine to Sit     Supine to sit: Mod assist     General bed mobility comments: Pt shows great effort getting up to sitting EOB but needs considerable assist to rise and to be able to hold bed rails (secondary to vision issues)  Transfers Overall transfer level: Needs assistance Equipment used: Rolling walker (2 wheeled) Transfers: Sit to/from Stand Sit to Stand: Min assist;Mod assist         General transfer comment: Pt again shows good effort but struggles to get fully upright and needs assist to bring hips over  BOS and attain (and maintain) upright.   Ambulation/Gait Ambulation/Gait assistance: Mod assist Ambulation Distance (Feet): 2 Feet Assistive device: Rolling walker (2 wheeled)       General Gait Details: Pt able to very slowly go bed to recliner but ultiamtely is very weak and labored with the effort.  He had no overt LOBs during the effort but was grossly unsteady the entire time.  Pt unable to really move feet more than 1-2 inches with each step and even that was with great effort and a lot of cuing.   Stairs            Wheelchair Mobility    Modified Rankin (Stroke Patients Only)       Balance Overall balance assessment: Needs assistance Sitting-balance support: Bilateral upper extremity supported Sitting balance-Leahy Scale: Fair     Standing balance support: Bilateral upper extremity supported Standing balance-Leahy Scale: Poor                               Pertinent Vitals/Pain      Home Living Family/patient expects to be discharged to:: Private residence Living Arrangements: Spouse/significant other Available Help at Discharge: Available 24 hours/day;Personal care attendant;Family Type of Home: House Home Access: Ramped entrance     Home Layout: One level Home Equipment: Wheelchair - [employer] Corporationmanual;Walker - 2 wheels;Cane - single point;Bedside commode  Prior Function Level of Independence: Needs assistance         Comments: Pt has going to outpateint PT recently and started working on walking (max of ~50 ft) but has not been walking in the home for many months.     Hand Dominance        Extremity/Trunk Assessment   Upper Extremity Assessment: Generalized weakness (R UE sore with resistance, grossly 3+/5 in available range)           Lower Extremity Assessment: Generalized weakness (grossly 4-/5 in b/l LEs, = bilaterally)         Communication   Communication: HOH (Pt with delayed up take and response with most questions)   Cognition Arousal/Alertness: Lethargic Behavior During Therapy: WFL for tasks assessed/performed Overall Cognitive Status: History of cognitive impairments - at baseline (family reports mental status is not too far from baseline)                      General Comments      Exercises General Exercises - Lower Extremity Ankle Circles/Pumps: Strengthening;AROM;5 reps;Both Heel Slides: Strengthening;5 reps;Both Hip ABduction/ADduction: Strengthening;5 reps;Both Straight Leg Raises: AAROM;5 reps;Both      Assessment/Plan    PT Assessment Patient needs continued PT services  PT Diagnosis Difficulty walking;Generalized weakness   PT Problem List Decreased strength;Decreased range of motion;Decreased activity tolerance;Decreased balance;Decreased mobility;Decreased coordination;Decreased cognition;Decreased knowledge of use of DME;Decreased safety awareness  PT Treatment Interventions Gait training;Functional mobility training;Therapeutic activities;Therapeutic exercise;DME instruction;Balance training;Neuromuscular re-education;Patient/family education;Cognitive remediation   PT Goals (Current goals can be found in the Care Plan section) Acute Rehab PT Goals Patient Stated Goal: go home PT Goal Formulation: With patient/family Time For Goal Achievement: 09/01/15 Potential to Achieve Goals: Fair    Frequency Min 2X/week   Barriers to discharge        Co-evaluation               End of Session Equipment Utilized During Treatment: Gait belt Activity Tolerance: Patient tolerated treatment well Patient left: with chair alarm set;with call bell/phone within reach;with family/visitor present      Functional Assessment Tool Used: clinical judgement Functional Limitation: Mobility: Walking and moving around Mobility: Walking and Moving Around Current Status [phone]): At least 60 percent but less than 80 percent impaired, limited or restricted Mobility: Walking and Moving  Around Goal Status [phone]): At least 20 percent but less than 40 percent impaired, limited or restricted    Time: 1031-1102 PT Time Calculation (min) (ACUTE ONLY): 31 min   Charges:   PT Evaluation $PT Eval Moderate Complexity: 1 Procedure PT Treatments $Therapeutic Exercise: 8-22 mins   PT G Codes:   PT G-Codes **NOT FOR INPATIENT CLASS** Functional Assessment Tool Used: clinical judgement Functional Limitation: Mobility: Walking and moving around Mobility: Walking and Moving Around Current Status ([id number]): At least 60 percent but less than 80 percent impaired, limited or restricted Mobility: Walking and Moving Around Goal Status [phone]): At least 20 percent but less than 40 percent impaired, limited or restricted    Malachi Pro, DPT  08/18/2015, 12:38 PM

## 2015-08-18 NOTE — NC FL2 (Signed)
[place] MEDICAID FL2 LEVEL OF CARE SCREENING TOOL     IDENTIFICATION  Patient Name: [name] [name] Birthdate: 04/11/1921 Sex: male Admission Date (Current Location): 08/16/2015  [place]   Facility and Address:  [facility], [address], [place], [place] [address]      Provider Number: [id number]  Attending Physician Name and Address:  Alford Highlandichard Wieting, MD  Relative Name and Phone Number:       Current Level of Care: Hospital Recommended Level of Care: Skilled Nursing Facility Prior Approval Number:    Date Approved/Denied:   PASRR Number: [id number] [id number]  Discharge Plan: SNF    Current Diagnoses: Patient Active Problem List   Diagnosis Date Noted  . Altered mental status 08/18/2015  . Pneumonia 02/09/2015  . 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 Discoloration of skin of foot-Left 10/13/2013  . PAD (peripheral artery disease) (HCC)   . TIA (transient ischemic attack)   . Edema   . Hyperlipidemia   . Syncope   . CAD (coronary artery disease)   . Venous insufficiency   . Carotid artery disease (HCC)   . Elevated CPK   . Popliteal aneurysm (HCC)   . Speech problem   . Hx of CABG   . Ejection fraction   . Prominent abdominal aortic pulsation   . Intracranial hemorrhage (HCC)   . Bradycardia     Orientation RESPIRATION BLADDER Height & Weight     Self, Time, Situation, Place  Normal Incontinent Weight: 207 lb 4 oz (94.008 kg) Height:  5\' 11"  (180.3 cm)  BEHAVIORAL SYMPTOMS/MOOD NEUROLOGICAL BOWEL NUTRITION STATUS      Incontinent Diet (DYS 3)  AMBULATORY STATUS COMMUNICATION OF NEEDS Skin   Limited Assist Verbally Normal                       Personal Care Assistance Level of Assistance  Bathing, Feeding, Dressing Bathing Assistance: Limited assistance Feeding assistance: Limited assistance Dressing Assistance: Limited assistance     Functional Limitations Info  Sight, Hearing,  Speech Sight Info: Impaired Hearing Info: Adequate Speech Info: Adequate    SPECIAL CARE FACTORS FREQUENCY  PT (By licensed PT), OT (By licensed OT), Speech therapy     PT Frequency: 5 OT Frequency: 5     Speech Therapy Frequency: 5      Contractures      Additional Factors Info  Code Status, Allergies Code Status Info: Full Code Allergies Info: Allergies: Penicillins, Latex, Sulfonamide Derivatives           Current Medications (08/18/2015):  This is the current hospital active medication list Current Facility-Administered Medications  Medication Dose Route Frequency Provider Last Rate Last Dose  . 0.9 %  sodium chloride infusion   Intravenous Continuous Alford Highlandichard Wieting, MD 30 mL/hr at 08/18/15 0705    . acetaminophen (TYLENOL) tablet 650 mg  650 mg Oral Q6H PRN Joella PrinceJames Sullivan, MD       Or  . acetaminophen (TYLENOL) suppository 650 mg  650 mg Rectal Q6H PRN Joella PrinceJames Sullivan, MD      . aspirin EC tablet 81 mg  81 mg Oral Daily Alford Highlandichard Wieting, MD   81 mg at 08/18/15 1129  . calcium carbonate (TUMS - dosed in mg elemental calcium) chewable tablet 400 mg of elemental calcium  2 tablet Oral Daily Joella PrinceJames Sullivan, MD   400 mg of elemental calcium at 08/18/15 1129  . docusate sodium (COLACE) capsule 100 mg  100 mg Oral BID PRN Joella Prince, MD      . enoxaparin (LOVENOX) injection 40 mg  40 mg Subcutaneous Q24H Joella Prince, MD   40 mg at 08/17/15 2107  . famotidine (PEPCID) IVPB 20 mg premix  20 mg Intravenous Q12H Alford Highland, MD   20 mg at 08/18/15 0455  . fluticasone (FLONASE) 50 MCG/ACT nasal spray 1 spray  1 spray Each Nare Daily Alford Highland, MD   1 spray at 08/18/15 1129  . senna-docusate (Senokot-S) tablet 1 tablet  1 tablet Oral QHS PRN Joella Prince, MD         Discharge Medications: Please see discharge summary for a list of discharge medications.  Relevant Imaging Results:  Relevant Lab Results:   Additional Information SSN:  [id number]  Dede Query, LCSW

## 2015-08-18 NOTE — Progress Notes (Signed)
Patient ID: [name] [name], male   DOB: 1921-07-02, 82 y.o.   MRN: [id number] [employer] PROGRESS NOTE  [name] QIH:[id number] DOB: [birth year] DOA: 08/16/2015 PCP: [name], MD  HPI/Subjective: Patient still not acting right, lethargic. He does answer yes or no questions. He did sleep last night. Cough is less.  Objective: Filed Vitals:   08/18/15 0443 08/18/15 1014  BP: 142/70 132/67  Pulse: 72 64  Temp: 98.8 F (37.1 C) 97.7 F (36.5 C)  Resp: 20 20    Filed Weights   08/16/15 1914 08/17/15 0300  Weight: 95.255 kg (210 lb) 94.008 kg (207 lb 4 oz)    ROS: Review of Systems  Constitutional: Negative for fever and chills.  Eyes: Negative for blurred vision.  Respiratory: Positive for cough. Negative for shortness of breath.   Cardiovascular: Negative for chest pain.  Gastrointestinal: Negative for nausea, vomiting, abdominal pain, diarrhea and constipation.  Genitourinary: Negative for dysuria.  Musculoskeletal: Negative for joint pain.  Neurological: Negative for dizziness and headaches.   Exam: Physical Exam  HENT:  Nose: No mucosal edema.  Mouth/Throat: No oropharyngeal exudate or posterior oropharyngeal edema.  Eyes: Conjunctivae, EOM and lids are normal. Pupils are equal, round, and reactive to light.  Neck: No JVD present. Carotid bruit is not present. No edema present. No thyroid mass and no thyromegaly present.  Cardiovascular: S1 normal and S2 normal.  Exam reveals no gallop.   Murmur heard.  Systolic murmur is present with a grade of 2/6  Pulses:      Dorsalis pedis pulses are 1+ on the right side, and 1+ on the left side.  Respiratory: No respiratory distress. He has no wheezes. He has no rhonchi. He has no rales.  GI: Soft. Bowel sounds are normal. There is no tenderness.  Musculoskeletal:       Right ankle: He exhibits swelling.       Left ankle: He exhibits swelling.  Lymphadenopathy:    He has no cervical adenopathy.  Neurological: He  is alert.  Left eye still does not go all the way to the left. He states vision is okay  Skin: Skin is warm. No rash noted. Nails show no clubbing.  Psychiatric: His affect is blunt.      Data Reviewed: Basic Metabolic Panel:  Recent Labs Lab 08/16/15 1929 08/17/15 0240 08/18/15 0508  NA 133* 133* 131*  K 4.2 4.1 3.6  CL 97* 97* 99*  CO2 25 27 24   GLUCOSE 101* 108* 111*  BUN 16 14 11   CREATININE 0.71 0.67 0.65  CALCIUM 9.1 8.9 8.2*   CBC:  Recent Labs Lab 08/16/15 1929 08/17/15 0240 08/18/15 0508  WBC 8.4 7.4 7.1  HGB 13.3 13.2 12.6*  HCT 39.5* 38.9* 36.8*  MCV 89.7 90.1 89.8  PLT 163 156 136*   BNP (last 3 results)  Recent Labs  08/03/15 1125  BNP 154.0*      Studies: Dg Chest 2 View  08/16/2015  CLINICAL DATA:  Cough EXAM: CHEST  2 VIEW COMPARISON:  08/03/2015 FINDINGS: Cardiomediastinal silhouette is stable. Again noted moderate gaseous distension of the stomach. Status post CABG. Osteopenia and mild degenerative change thoracic spine. No infiltrate or pulmonary edema. IMPRESSION: No active disease. Again noted moderate gaseous distension of the stomach. Electronically Signed   By: Natasha Mead M.D.   On: 08/16/2015 20:14   Ct Head Wo Contrast  08/16/2015  CLINICAL DATA:  Sudden blindness in the left eye. EXAM: CT HEAD WITHOUT  CONTRAST TECHNIQUE: Contiguous axial images were obtained from the base of the skull through the vertex without intravenous contrast. COMPARISON:  08/03/2015 FINDINGS: Stable ventriculomegaly. There is stable low-density in the periventricular white matter. Evidence for mild cerebral atrophy. No evidence for acute hemorrhage, mass lesion, midline shift or large infarct. Mild mucosal disease in the right ethmoid air cells. Otherwise, the visualized paranasal sinuses are clear. Mastoid air cells are clear. No calvarial fracture. IMPRESSION: No acute intracranial abnormality. Stable ventriculomegaly. Stable mild atrophy and evidence for  chronic small vessel ischemic changes. Electronically Signed   By: Richarda OverlieAdam  Henn M.D.   On: 08/16/2015 20:08   Mr Brain Wo Contrast  08/17/2015  CLINICAL DATA:  82 year old male with stroke like symptoms, visual problems looking to the left. Initial encounter. EXAM: MRI HEAD WITHOUT CONTRAST MRA HEAD WITHOUT CONTRAST TECHNIQUE: Multiplanar, multiecho pulse sequences of the brain and surrounding structures were obtained without intravenous contrast. Angiographic images of the head were obtained using MRA technique without contrast. COMPARISON:  Head CT without contrast 08/16/2015. [facility]/27/2010. FINDINGS: MRI HEAD FINDINGS Major intracranial vascular flow voids are stable. No restricted diffusion to suggest acute infarction. No midline shift, mass effect, evidence of mass lesion, extra-axial collection or acute intracranial hemorrhage. Cervicomedullary junction and pituitary are within normal limits. Stable cerebral volume. Stable ventricular prominence without acute ventriculomegaly. Patchy periventricular T2 and FLAIR hyperintensity has not significantly changed. Regressed hemosiderin in the right lateral ventricle since the 2012 comparison. No other chronic cerebral blood products identified. T2 heterogeneity throughout the deep gray matter nuclei has not significantly changed an appears to be in large part due to perivascular spaces. Patchy T2 hyperintensity in the pons has not significantly changed. Stable and negative visualized internal auditory structures. Small volume retained secretions in the nasopharynx. Stable and well pneumatized visualized paranasal sinuses and mastoids. Orbits soft tissues appear stable and normal aside from postoperative changes to the right globe. Negative scalp soft tissues. Negative for age visualized cervical spine. MRA HEAD FINDINGS Stable antegrade flow in the posterior circulation. MOTSA artifact suspected in the lower posterior  fossa. No distal vertebral artery stenosis suspected. Bilateral PICA flow is visible. The vertebrobasilar junction and basilar artery remain normal without stenosis. SCA and PCA origins remain normal. Normal left posterior communicating artery, the right is diminutive or absent. Bilateral PCA branches are stable and within normal limits. Antegrade flow in both ICA siphons. No siphon stenosis. Ophthalmic and left posterior communicating artery origins remain normal. Carotid termini remain patent. MCA and ACA origins remain normal. Anterior communicating artery and visualized ACA branches are stable and within normal limits. Visualized bilateral MCA branches are stable and normal aside from mild tortuosity. IMPRESSION: 1.  No acute intracranial abnormality. 2. Expected evolution of the small volume intraventricular hemorrhage on the right in 2012. Otherwise stable noncontrast MRI appearance the brain, including chronic signal changes felt to be small vessel disease related. 3. Stable and negative intracranial MRA. Electronically Signed   By: Odessa FlemingH  Hall M.D.   On: 08/17/2015 10:21   [place] Carotid Bilateral  08/17/2015  CLINICAL DATA:  CVA. EXAM: BILATERAL CAROTID DUPLEX ULTRASOUND TECHNIQUE: Wallace CullensGray scale imaging, color Doppler and duplex ultrasound were performed of bilateral carotid and vertebral arteries in the neck. COMPARISON:  MRI 08/17/2015 . FINDINGS: Criteria: Quantification of carotid stenosis is based on velocity parameters that correlate the residual internal carotid diameter with NASCET-based stenosis levels, using the diameter of the distal internal carotid lumen as the denominator  for stenosis measurement. The following velocity measurements were obtained: RIGHT ICA:  72/12 cm/sec CCA:  89/12 cm/sec SYSTOLIC ICA/CCA RATIO:  0.8 DIASTOLIC ICA/CCA RATIO:  1.1 ECA:  59 cm/sec LEFT ICA:  57/14 cm/sec CCA:  73/12 cm/sec SYSTOLIC ICA/CCA RATIO:  0.8 DIASTOLIC ICA/CCA RATIO:  1.1 ECA:  34 cm/sec RIGHT CAROTID ARTERY:  No significant carotid atherosclerotic vascular disease. No flow limiting stenosis. RIGHT VERTEBRAL ARTERY:  Patent with antegrade flow. LEFT CAROTID ARTERY: Mild left carotid bifurcation proximal ICA atherosclerotic vascular plaque. No flow limiting stenosis. LEFT VERTEBRAL ARTERY:  Patent with antegrade flow. IMPRESSION: 1. Mild left carotid bifurcation proximal ICA calcified atherosclerotic vascular plaque. No flow limiting stenosis. Degree of stenosis less than 50%. Right carotid widely patent. 2.  Vertebral arteries are patent with antegrade flow. Electronically Signed   By: Maisie Fus  Register   On: 08/17/2015 10:48   Mr Maxine Glenn Head/brain Wo Cm  08/17/2015  CLINICAL DATA:  82 year old male with stroke like symptoms, visual problems looking to the left. Initial encounter. EXAM: MRI HEAD WITHOUT CONTRAST MRA HEAD WITHOUT CONTRAST TECHNIQUE: Multiplanar, multiecho pulse sequences of the brain and surrounding structures were obtained without intravenous contrast. Angiographic images of the head were obtained using MRA technique without contrast. COMPARISON:  Head CT without contrast 08/16/2015. [facility] Brain MRI and intracranial MRA 06/27/2010. FINDINGS: MRI HEAD FINDINGS Major intracranial vascular flow voids are stable. No restricted diffusion to suggest acute infarction. No midline shift, mass effect, evidence of mass lesion, extra-axial collection or acute intracranial hemorrhage. Cervicomedullary junction and pituitary are within normal limits. Stable cerebral volume. Stable ventricular prominence without acute ventriculomegaly. Patchy periventricular T2 and FLAIR hyperintensity has not significantly changed. Regressed hemosiderin in the right lateral ventricle since the 2012 comparison. No other chronic cerebral blood products identified. T2 heterogeneity throughout the deep gray matter nuclei has not significantly changed an appears to be in large part due to perivascular spaces. Patchy T2  hyperintensity in the pons has not significantly changed. Stable and negative visualized internal auditory structures. Small volume retained secretions in the nasopharynx. Stable and well pneumatized visualized paranasal sinuses and mastoids. Orbits soft tissues appear stable and normal aside from postoperative changes to the right globe. Negative scalp soft tissues. Negative for age visualized cervical spine. MRA HEAD FINDINGS Stable antegrade flow in the posterior circulation. MOTSA artifact suspected in the lower posterior fossa. No distal vertebral artery stenosis suspected. Bilateral PICA flow is visible. The vertebrobasilar junction and basilar artery remain normal without stenosis. SCA and PCA origins remain normal. Normal left posterior communicating artery, the right is diminutive or absent. Bilateral PCA branches are stable and within normal limits. Antegrade flow in both ICA siphons. No siphon stenosis. Ophthalmic and left posterior communicating artery origins remain normal. Carotid termini remain patent. MCA and ACA origins remain normal. Anterior communicating artery and visualized ACA branches are stable and within normal limits. Visualized bilateral MCA branches are stable and normal aside from mild tortuosity. IMPRESSION: 1.  No acute intracranial abnormality. 2. Expected evolution of the small volume intraventricular hemorrhage on the right in 2012. Otherwise stable noncontrast MRI appearance the brain, including chronic signal changes felt to be small vessel disease related. 3. Stable and negative intracranial MRA. Electronically Signed   By: Odessa Fleming M.D.   On: 08/17/2015 10:21    Scheduled Meds: . aspirin EC  81 mg Oral Daily  . calcium carbonate  2 tablet Oral Daily  . enoxaparin (LOVENOX) injection  40 mg Subcutaneous Q24H  .  famotidine (PEPCID) IV  20 mg Intravenous Q12H  . fluticasone  1 spray Each Nare Daily   Continuous Infusions: . sodium chloride 30 mL/hr at 08/18/15 0705     Assessment/Plan:  1. Acute encephalopathy. Patient still not right as per family. They do not want a lumbar puncture at this point.  Will get mri brain with contrast. Will repeat cxr. Family does not want seroquel. Monitor temperature curve. 2. Cough. Had a recent Z-Pak. Pepcid for possible acid reflux and Flonase nasal spray for possible postnasal drip. 3. Lower extremity edema. Family has been hesitant before about diuretic. I will decrease the IV fluid hydration at this point. 4.   History of CAD on aspirin 5.   Deconditioning. PT rec rehab and family is interested   Code Status:     Code Status Orders        Start     Ordered   08/17/15 0113  Full code   Continuous     08/17/15 0114    Code Status History    Date Active Date Inactive Code Status Order ID Comments User Context   08/17/2015  1:14 AM  Full Code [id number]  Joella Prince, MD ED    Advance Directive Documentation        Most Recent Value   Type of Advance Directive  Healthcare Power of Attorney   Pre-existing out of facility DNR order (yellow form or pink MOST form)     "MOST" Form in Place?       Family Communication: Spoke with Family at the bedside Disposition Plan: Likely to rehab.  Case discussed with Neurology and opthalmology  Consultants:  Neurology  Opthalmology  Time spent: 35 minutes  Alford Highland  [employer]

## 2015-08-18 NOTE — H&P (Signed)
Reason for Consult: bilateral vision loss and impaired ocular motility both eyes   [name] [name] is a 82 y.o. male.  Chief complaint: bilateral vision loss HPI: [name] [name] last had a dilated fundus exam in 09/2014 at which time he was found to have a cataract in the left eye, pseudophakia OD, and some dry eye, otherwise normal exam.  Per his daughter, he was reading normally at home on Monday and Tuesday.  He underwent physical therapy early this week without difficulty.  On Wednesday afternoon she came to [name] [name]'s home and found his eyes to appear "unfocused" and his eyes did not track her fingers movements.  He was also very lethargic.  Due to his persistent lethargy, [name] [name] is unable to discuss his symptoms.  He was in and out of sleep during the entire exam.  Per his daughter, the vision has not worsened but has not improved either.   Past Medical History  Diagnosis Date  . CAD (coronary artery disease)     nuclear 01/2008, no ischemia, EF 60%, distal anterior scar  . Hyperlipidemia   . Syncope     .remote..no etiology  /  syncope 11/2008.. from acute leg pain  . Venous insufficiency     support hose  . Carotid artery disease ([place])     doppler 05/2009,  0-39% bilateral / doppler 06/2010 OK  . Elevated CPK     11/23/08.Marland KitchenMarland Kitchenprobably from debris from popliteal aneurysm  . 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 Oneida Alar  . Speech problem     Neurologic event with brief infusion and speech difficulty.... January, 2011.... being assessed by neurology  February, 2011  . Hx of CABG     1998  . Ejection fraction     EF 45%, echo,05/2009,apical hypo,  ?? posterior and lateral hypo  . Prominent abdominal aortic pulsation     doppler normal  . Intracranial hemorrhage (HCC)     Two small subependymal bleeds 06/2010,,ASA held  . Bradycardia     06/2010  . Edema     Ankle edema August, 2012  . TIA (transient ischemic attack)     Brief episode of confusion and speech difficulty, January,  2012  /  recurrent confusion speech difficulty September, 2012  . PAD (peripheral artery disease) (HCC)     Above the knee to below the knee popliteal bypass,, right  //    Doppler,  July, 2013, patent  . Fall at home July 2015    Review of Systems  Constitutional: Negative for fever and chills.  Eyes: Positive for blurred vision. Negative for double vision, photophobia, pain, discharge and redness.  Respiratory: Positive for cough. Negative for hemoptysis.   Cardiovascular: Negative for chest pain.  Gastrointestinal: Positive for heartburn.  Genitourinary: Negative for dysuria.  Musculoskeletal: Negative for myalgias.  Neurological: Negative for dizziness, tingling, tremors and headaches.  Endo/Heme/Allergies: Does not bruise/bleed easily.  Psychiatric/Behavioral: Negative for depression.    Past Surgical History  Procedure Laterality Date  . Coronary artery bypass graft  1998  . Popliteal bypass  with reversed greater saphenous vein from right leg.      Family History  Problem Relation Age of Onset  . Diabetes Sister   . Stroke Mother   . Diabetes Mother     Social History:  reports that he quit smoking about 68 years ago. His smoking use included Cigarettes. He quit after 20 years of use. He has never used smokeless tobacco. He  reports that he does not drink alcohol or use illicit drugs.  Allergies:  Allergies  Allergen Reactions  . Penicillins Hives    Has patient had a PCN reaction causing immediate rash, facial/tongue/throat swelling, SOB or lightheadedness with hypotension: Yes Has patient had a PCN reaction causing severe rash involving mucus membranes or skin necrosis: Yes Has patient had a PCN reaction that required hospitalization No Has patient had a PCN reaction occurring within the last 10 years: No If all of the above answers are "NO", then may proceed with Cephalosporin use.   . Latex Rash  . Sulfonamide Derivatives Rash    Medications: I have reviewed  the patient's current medications.  Results for orders placed or performed during the hospital encounter of 08/16/15 (from the past 48 hour(s))  CBC     Status: Abnormal   Collection Time: 08/16/15  7:29 PM  Result Value Ref Range   WBC 8.4 3.8 - 10.6 K/uL   RBC 4.40 4.40 - 5.90 MIL/uL   Hemoglobin 13.3 13.0 - 18.0 g/dL   HCT 39.5 (L) 40.0 - 52.0 %   MCV 89.7 80.0 - 100.0 fL   MCH 30.3 26.0 - 34.0 pg   MCHC 33.8 32.0 - 36.0 g/dL   RDW 14.3 11.5 - 14.5 %   Platelets 163 150 - 440 K/uL  Basic metabolic panel     Status: Abnormal   Collection Time: 08/16/15  7:29 PM  Result Value Ref Range   Sodium 133 (L) 135 - 145 mmol/L   Potassium 4.2 3.5 - 5.1 mmol/L   Chloride 97 (L) 101 - 111 mmol/L   CO2 25 22 - 32 mmol/L   Glucose, Bld 101 (H) 65 - 99 mg/dL   BUN 16 6 - 20 mg/dL   Creatinine, Ser 0.71 0.61 - 1.24 mg/dL   Calcium 9.1 8.9 - 10.3 mg/dL   GFR calc non Af Amer >60 >60 mL/min   GFR calc Af Amer >60 >60 mL/min    Comment: (NOTE) The eGFR has been calculated using the CKD EPI equation. This calculation has not been validated in all clinical situations. eGFR's persistently <60 mL/min signify possible Chronic Kidney Disease.    Anion gap 11 5 - 15  Urinalysis complete, with microscopic ([facility] only)     Status: Abnormal   Collection Time: 08/16/15  9:46 PM  Result Value Ref Range   Color, Urine YELLOW (A) YELLOW   APPearance CLEAR (A) CLEAR   Glucose, UA NEGATIVE NEGATIVE mg/dL   Bilirubin Urine NEGATIVE NEGATIVE   Ketones, ur NEGATIVE NEGATIVE mg/dL   Specific Gravity, Urine 1.016 1.005 - 1.030   Hgb urine dipstick NEGATIVE NEGATIVE   pH 6.0 5.0 - 8.0   Protein, ur NEGATIVE NEGATIVE mg/dL   Nitrite NEGATIVE NEGATIVE   Leukocytes, UA NEGATIVE NEGATIVE   RBC / HPF 0-5 0 - 5 RBC/hpf   WBC, UA 0-5 0 - 5 WBC/hpf   Bacteria, UA NONE SEEN NONE SEEN   Squamous Epithelial / LPF 0-5 (A) NONE SEEN   Mucous PRESENT   CBC     Status: Abnormal   Collection Time: 08/17/15  2:40 AM   Result Value Ref Range   WBC 7.4 3.8 - 10.6 K/uL   RBC 4.31 (L) 4.40 - 5.90 MIL/uL   Hemoglobin 13.2 13.0 - 18.0 g/dL   HCT 38.9 (L) 40.0 - 52.0 %   MCV 90.1 80.0 - 100.0 fL   MCH 30.5 26.0 - 34.0 pg   MCHC  33.9 32.0 - 36.0 g/dL   RDW 13.9 11.5 - 14.5 %   Platelets 156 150 - 440 K/uL  Basic metabolic panel     Status: Abnormal   Collection Time: 08/17/15  2:40 AM  Result Value Ref Range   Sodium 133 (L) 135 - 145 mmol/L   Potassium 4.1 3.5 - 5.1 mmol/L   Chloride 97 (L) 101 - 111 mmol/L   CO2 27 22 - 32 mmol/L   Glucose, Bld 108 (H) 65 - 99 mg/dL   BUN 14 6 - 20 mg/dL   Creatinine, Ser 0.67 0.61 - 1.24 mg/dL   Calcium 8.9 8.9 - 10.3 mg/dL   GFR calc non Af Amer >60 >60 mL/min   GFR calc Af Amer >60 >60 mL/min    Comment: (NOTE) The eGFR has been calculated using the CKD EPI equation. This calculation has not been validated in all clinical situations. eGFR's persistently <60 mL/min signify possible Chronic Kidney Disease.    Anion gap 9 5 - 15  TSH     Status: None   Collection Time: 08/17/15  2:40 AM  Result Value Ref Range   TSH 3.150 0.350 - 4.500 uIU/mL  Lipid panel     Status: None   Collection Time: 08/17/15  2:40 AM  Result Value Ref Range   Cholesterol 138 0 - 200 mg/dL   Triglycerides 52 <150 mg/dL   HDL 41 >40 mg/dL   Total CHOL/HDL Ratio 3.4 RATIO   VLDL 10 0 - 40 mg/dL   LDL Cholesterol 87 0 - 99 mg/dL    Comment:        Total Cholesterol/HDL:CHD Risk Coronary Heart Disease Risk Table                     Men   Women  1/2 Average Risk   3.4   3.3  Average Risk       5.0   4.4  2 X Average Risk   9.6   7.1  3 X Average Risk  23.4   11.0        Use the calculated Patient Ratio above and the CHD Risk Table to determine the patient's CHD Risk.        ATP III CLASSIFICATION (LDL):  <100     mg/dL   Optimal  100-129  mg/dL   Near or Above                    Optimal  130-159  mg/dL   Borderline  160-189  mg/dL   High  >190     mg/dL   Very High    Hemoglobin A1c     Status: None   Collection Time: 08/17/15  2:40 AM  Result Value Ref Range   Hgb A1c MFr Bld 5.3 4.0 - 6.0 %  Sedimentation rate     Status: Abnormal   Collection Time: 08/17/15  3:48 PM  Result Value Ref Range   Sed Rate 37 (H) 0 - 20 mm/hr  C-reactive protein     Status: Abnormal   Collection Time: 08/17/15  3:48 PM  Result Value Ref Range   CRP 2.0 (H) <1.0 mg/dL    Comment: Performed at [facility]  Vitamin B12     Status: None   Collection Time: 08/17/15  3:48 PM  Result Value Ref Range   Vitamin B-12 772 180 - 914 pg/mL    Comment: (NOTE) This assay is  not validated for testing neonatal or myeloproliferative syndrome specimens for Vitamin B12 levels. Performed at [facility]   Rapid HIV screen (HIV 1/2 Ab+Ag) ([facility] Only)     Status: None   Collection Time: 08/17/15  3:48 PM  Result Value Ref Range   HIV-1 P24 Antigen - HIV24 NON REACTIVE NON REACTIVE   HIV 1/2 Antibodies NON REACTIVE NON REACTIVE   Interpretation (HIV Ag Ab)      A non reactive test result means that HIV 1 or HIV 2 antibodies and HIV 1 p24 antigen were not detected in the specimen.  CBC     Status: Abnormal   Collection Time: 08/18/15  5:08 AM  Result Value Ref Range   WBC 7.1 3.8 - 10.6 K/uL   RBC 4.10 (L) 4.40 - 5.90 MIL/uL   Hemoglobin 12.6 (L) 13.0 - 18.0 g/dL   HCT 36.8 (L) 40.0 - 52.0 %   MCV 89.8 80.0 - 100.0 fL   MCH 30.8 26.0 - 34.0 pg   MCHC 34.3 32.0 - 36.0 g/dL   RDW 14.0 11.5 - 14.5 %   Platelets 136 (L) 150 - 440 K/uL  Basic metabolic panel     Status: Abnormal   Collection Time: 08/18/15  5:08 AM  Result Value Ref Range   Sodium 131 (L) 135 - 145 mmol/L   Potassium 3.6 3.5 - 5.1 mmol/L   Chloride 99 (L) 101 - 111 mmol/L   CO2 24 22 - 32 mmol/L   Glucose, Bld 111 (H) 65 - 99 mg/dL   BUN 11 6 - 20 mg/dL   Creatinine, Ser 0.65 0.61 - 1.24 mg/dL   Calcium 8.2 (L) 8.9 - 10.3 mg/dL   GFR calc non Af Amer >60 >60 mL/min   GFR calc Af Amer >60 >60  mL/min    Comment: (NOTE) The eGFR has been calculated using the CKD EPI equation. This calculation has not been validated in all clinical situations. eGFR's persistently <60 mL/min signify possible Chronic Kidney Disease.    Anion gap 8 5 - 15    Dg Chest 2 View  08/16/2015  CLINICAL DATA:  Cough EXAM: CHEST  2 VIEW COMPARISON:  08/03/2015 FINDINGS: Cardiomediastinal silhouette is stable. Again noted moderate gaseous distension of the stomach. Status post CABG. Osteopenia and mild degenerative change thoracic spine. No infiltrate or pulmonary edema. IMPRESSION: No active disease. Again noted moderate gaseous distension of the stomach. Electronically Signed   By: Lahoma Crocker M.D.   On: 08/16/2015 20:14   Ct Head Wo Contrast  08/16/2015  CLINICAL DATA:  Sudden blindness in the left eye. EXAM: CT HEAD WITHOUT CONTRAST TECHNIQUE: Contiguous axial images were obtained from the base of the skull through the vertex without intravenous contrast. COMPARISON:  08/03/2015 FINDINGS: Stable ventriculomegaly. There is stable low-density in the periventricular white matter. Evidence for mild cerebral atrophy. No evidence for acute hemorrhage, mass lesion, midline shift or large infarct. Mild mucosal disease in the right ethmoid air cells. Otherwise, the visualized paranasal sinuses are clear. Mastoid air cells are clear. No calvarial fracture. IMPRESSION: No acute intracranial abnormality. Stable ventriculomegaly. Stable mild atrophy and evidence for chronic small vessel ischemic changes. Electronically Signed   By: Markus Daft M.D.   On: 08/16/2015 20:08   Mr Brain Wo Contrast  08/17/2015  CLINICAL DATA:  82 year old male with stroke like symptoms, visual problems looking to the left. Initial encounter. EXAM: MRI HEAD WITHOUT CONTRAST MRA HEAD WITHOUT CONTRAST TECHNIQUE: Multiplanar, multiecho pulse sequences  of the brain and surrounding structures were obtained without intravenous contrast. Angiographic images of  the head were obtained using MRA technique without contrast. COMPARISON:  Head CT without contrast 08/16/2015. [facility] Brain MRI and intracranial MRA 06/27/2010. FINDINGS: MRI HEAD FINDINGS Major intracranial vascular flow voids are stable. No restricted diffusion to suggest acute infarction. No midline shift, mass effect, evidence of mass lesion, extra-axial collection or acute intracranial hemorrhage. Cervicomedullary junction and pituitary are within normal limits. Stable cerebral volume. Stable ventricular prominence without acute ventriculomegaly. Patchy periventricular T2 and FLAIR hyperintensity has not significantly changed. Regressed hemosiderin in the right lateral ventricle since the 2012 comparison. No other chronic cerebral blood products identified. T2 heterogeneity throughout the deep gray matter nuclei has not significantly changed an appears to be in large part due to perivascular spaces. Patchy T2 hyperintensity in the pons has not significantly changed. Stable and negative visualized internal auditory structures. Small volume retained secretions in the nasopharynx. Stable and well pneumatized visualized paranasal sinuses and mastoids. Orbits soft tissues appear stable and normal aside from postoperative changes to the right globe. Negative scalp soft tissues. Negative for age visualized cervical spine. MRA HEAD FINDINGS Stable antegrade flow in the posterior circulation. MOTSA artifact suspected in the lower posterior fossa. No distal vertebral artery stenosis suspected. Bilateral PICA flow is visible. The vertebrobasilar junction and basilar artery remain normal without stenosis. SCA and PCA origins remain normal. Normal left posterior communicating artery, the right is diminutive or absent. Bilateral PCA branches are stable and within normal limits. Antegrade flow in both ICA siphons. No siphon stenosis. Ophthalmic and left posterior communicating artery origins remain normal.  Carotid termini remain patent. MCA and ACA origins remain normal. Anterior communicating artery and visualized ACA branches are stable and within normal limits. Visualized bilateral MCA branches are stable and normal aside from mild tortuosity. IMPRESSION: 1.  No acute intracranial abnormality. 2. Expected evolution of the small volume intraventricular hemorrhage on the right in 2012. Otherwise stable noncontrast MRI appearance the brain, including chronic signal changes felt to be small vessel disease related. 3. Stable and negative intracranial MRA. Electronically Signed   By: Genevie Ann M.D.   On: 08/17/2015 10:21   US Carotid Bilateral  08/17/2015  CLINICAL DATA:  CVA. EXAM: BILATERAL CAROTID DUPLEX ULTRASOUND TECHNIQUE: Pearline Cables scale imaging, color Doppler and duplex ultrasound were performed of bilateral carotid and vertebral arteries in the neck. COMPARISON:  MRI 08/17/2015 . FINDINGS: Criteria: Quantification of carotid stenosis is based on velocity parameters that correlate the residual internal carotid diameter with NASCET-based stenosis levels, using the diameter of the distal internal carotid lumen as the denominator for stenosis measurement. The following velocity measurements were obtained: RIGHT ICA:  72/12 cm/sec CCA:  43/32 cm/sec SYSTOLIC ICA/CCA RATIO:  0.8 DIASTOLIC ICA/CCA RATIO:  1.1 ECA:  59 cm/sec LEFT ICA:  57/14 cm/sec CCA:  95/18 cm/sec SYSTOLIC ICA/CCA RATIO:  0.8 DIASTOLIC ICA/CCA RATIO:  1.1 ECA:  34 cm/sec RIGHT CAROTID ARTERY: No significant carotid atherosclerotic vascular disease. No flow limiting stenosis. RIGHT VERTEBRAL ARTERY:  Patent with antegrade flow. LEFT CAROTID ARTERY: Mild left carotid bifurcation proximal ICA atherosclerotic vascular plaque. No flow limiting stenosis. LEFT VERTEBRAL ARTERY:  Patent with antegrade flow. IMPRESSION: 1. Mild left carotid bifurcation proximal ICA calcified atherosclerotic vascular plaque. No flow limiting stenosis. Degree of stenosis less than  50%. Right carotid widely patent. 2.  Vertebral arteries are patent with antegrade flow. Electronically Signed   By: Marcello Moores  Register   On:  08/17/2015 10:48   Mr Jodene Nam Head/brain Wo Cm  08/17/2015  CLINICAL DATA:  82 year old male with stroke like symptoms, visual problems looking to the left. Initial encounter. EXAM: MRI HEAD WITHOUT CONTRAST MRA HEAD WITHOUT CONTRAST TECHNIQUE: Multiplanar, multiecho pulse sequences of the brain and surrounding structures were obtained without intravenous contrast. Angiographic images of the head were obtained using MRA technique without contrast. COMPARISON:  Head CT without contrast 08/16/2015. [facility] Brain MRI and intracranial MRA 06/27/2010. FINDINGS: MRI HEAD FINDINGS Major intracranial vascular flow voids are stable. No restricted diffusion to suggest acute infarction. No midline shift, mass effect, evidence of mass lesion, extra-axial collection or acute intracranial hemorrhage. Cervicomedullary junction and pituitary are within normal limits. Stable cerebral volume. Stable ventricular prominence without acute ventriculomegaly. Patchy periventricular T2 and FLAIR hyperintensity has not significantly changed. Regressed hemosiderin in the right lateral ventricle since the 2012 comparison. No other chronic cerebral blood products identified. T2 heterogeneity throughout the deep gray matter nuclei has not significantly changed an appears to be in large part due to perivascular spaces. Patchy T2 hyperintensity in the pons has not significantly changed. Stable and negative visualized internal auditory structures. Small volume retained secretions in the nasopharynx. Stable and well pneumatized visualized paranasal sinuses and mastoids. Orbits soft tissues appear stable and normal aside from postoperative changes to the right globe. Negative scalp soft tissues. Negative for age visualized cervical spine. MRA HEAD FINDINGS Stable antegrade flow in the posterior  circulation. MOTSA artifact suspected in the lower posterior fossa. No distal vertebral artery stenosis suspected. Bilateral PICA flow is visible. The vertebrobasilar junction and basilar artery remain normal without stenosis. SCA and PCA origins remain normal. Normal left posterior communicating artery, the right is diminutive or absent. Bilateral PCA branches are stable and within normal limits. Antegrade flow in both ICA siphons. No siphon stenosis. Ophthalmic and left posterior communicating artery origins remain normal. Carotid termini remain patent. MCA and ACA origins remain normal. Anterior communicating artery and visualized ACA branches are stable and within normal limits. Visualized bilateral MCA branches are stable and normal aside from mild tortuosity. IMPRESSION: 1.  No acute intracranial abnormality. 2. Expected evolution of the small volume intraventricular hemorrhage on the right in 2012. Otherwise stable noncontrast MRI appearance the brain, including chronic signal changes felt to be small vessel disease related. 3. Stable and negative intracranial MRA. Electronically Signed   By: Genevie Ann M.D.   On: 08/17/2015 10:21    There were no vitals taken for this visit.  Mental status: lethargic, eyes frequently closed during exam.  Visual Acuity:  Finger counting OU but inconsistent responses  Pupils:  Equally round/ reactive to light.  No Afferent defect.  Miotic OU.  Motility:  Full OU during retina exam (at that time he was slightly more awake and able to fully follow my commands)  Visual Fields:  Pt too lethargic to test.  IOP:  Soft to palpation OU  External/ Lids/ Lashes:  Normal  Anterior Segment:  Conjunctiva:  Normal  OU  Cornea:  Normal  OU  Anterior Chamber: Normal  OU  Lens:   Pseudophakia OD, mild-mod cataract OS  Posterior Segment: Dilated OU with 1% Tropicamide and 2.5% Phenylephrine  Discs:   Normal c/d ratio (0.3 OU), no pallor, no edema OU  Macula:  Normal - 1  disc diameter choroidal nevus along inferotemporal arcade OS no subretinal fluid  Vessels/ Periphery: Normal    Assessment/Plan: 1. Lethargy 2. Blurred vision  Normal eye exam OU.  I suspect that the perceived vision loss is more due to inattention / lethargy rather than true vision loss but difficult to test.  Should vision blurring persist past the hospitalization, please have patient followup on an outpatient basis to [facility].  Delynn Flavin [facility] 08/18/2015, 12:34 PM

## 2015-08-18 NOTE — Progress Notes (Addendum)
OT Cancellation Note  Patient Details Name: [name] [name] MRN: [id number] DOB: 05/07/1921   Cancelled Treatment:    Reason Eval/Treat Not Completed: Patient at procedure or test/ unavailable   [name] MessierElaine Cori Justus, MS, OTR/L   [name] [name] 08/18/2015, 9:21 AM

## 2015-08-18 NOTE — Care Management (Signed)
Physical therapy has recommended skilled nursing facility placement.  CSW is initiating bed search

## 2015-08-19 DIAGNOSIS — G453 Amaurosis fugax: Secondary | ICD-10-CM

## 2015-08-19 LAB — URINALYSIS COMPLETE WITH MICROSCOPIC (ARMC ONLY)

## 2015-08-19 MED ORDER — LEVOFLOXACIN IN D5W 500 MG/100ML IV SOLN
500.0000 mg | INTRAVENOUS | Status: DC
  Filled 2015-08-19: qty 100

## 2015-08-19 MED ORDER — GUAIFENESIN 100 MG/5ML PO SOLN
5.0000 mL | Freq: Two times a day (BID) | ORAL | Status: DC | PRN
  Administered 2015-08-19: 100 mg via ORAL
  Filled 2015-08-19 (×3): qty 10

## 2015-08-19 MED ORDER — LEVOFLOXACIN IN D5W 500 MG/100ML IV SOLN
500.0000 mg | Freq: Once | INTRAVENOUS | Status: AC
  Administered 2015-08-19: 500 mg via INTRAVENOUS
  Filled 2015-08-19: qty 100

## 2015-08-19 NOTE — Progress Notes (Signed)
Pharmacy Antibiotic Note  [name] [name] is a 82 y.o. male admitted on 08/16/2015 with pneumonia.  Pharmacy has been consulted for levofloxacin dosing.  Plan: Will order levofloxacin 500 mg IV q24h based on renal function.  Height: 5\' 11"  (180.3 cm) Weight: 207 lb 4 oz (94.008 kg) IBW/kg (Calculated) : 75.3  Temp (24hrs), Avg:98.1 F (36.7 C), Min:97.7 F (36.5 C), Max:99.1 F (37.3 C)   Recent Labs Lab 08/16/15 1929 08/17/15 0240 08/18/15 0508  WBC 8.4 7.4 7.1  CREATININE 0.71 0.67 0.65    Estimated Creatinine Clearance: 70.4 mL/min (by C-G formula based on Cr of 0.65).    Allergies  Allergen Reactions  . Penicillins Hives    Has patient had a PCN reaction causing immediate rash, facial/tongue/throat swelling, SOB or lightheadedness with hypotension: Yes Has patient had a PCN reaction causing severe rash involving mucus membranes or skin necrosis: Yes Has patient had a PCN reaction that required hospitalization No Has patient had a PCN reaction occurring within the last 10 years: No If all of the above answers are "NO", then may proceed with Cephalosporin use.   . Latex Rash  . Sulfonamide Derivatives Rash    Antimicrobials this admission: levofloxacin 6/17 >>   Dose adjustments this admission: Continue to follow renal function for adjustments  Microbiology results: No results at this time  Thank you for allowing pharmacy to be a part of this patient's care.  [name] [name] G 08/19/2015 8:19 AM

## 2015-08-19 NOTE — Progress Notes (Signed)
Subjective: As per family there is a report of vision improving.  Most of his symptoms are in the L eye.    Objective: Current vital signs: BP 137/66 mmHg  Pulse 77  Temp(Src) 99.1 F (37.3 C) (Axillary)  Resp 18  Ht 5\' 11"  (1.803 m)  Wt 94.008 kg (207 lb 4 oz)  BMI 28.92 kg/m2  SpO2 92% Vital signs in last 24 hours: Temp:  [97.7 F (36.5 C)-99.1 F (37.3 C)] 99.1 F (37.3 C) (06/17 1610) Pulse Rate:  [64-83] 77 (06/17 0619) Resp:  [18-20] 18 (06/17 0619) BP: (132-149)/(66-72) 137/66 mmHg (06/17 0619) SpO2:  [92 %-99 %] 92 % (06/17 0619)  Intake/Output from previous day: 06/16 0701 - 06/17 0700 In: 671.5 [P.O.:360; I.V.:311.5] Out: -  Intake/Output this shift:   Nutritional status: DIET DYS 3 Room service appropriate?: Yes with Assist; Fluid consistency:: Thin  Neurologic Exam: Mental Status: Alert. Speech fluent without evidence of aphasia. Able to follow commands without difficulty.  Cranial Nerves: II: Discs flat bilaterally; Able to count fingers on forward gaze.  With right eye able to count fingers in all visual fields.  With left eye continues to have visual deficits in the temporal visual field.  Pupils equal, round, reactive to light and accommodation III,IV, VI: ptosis not present, extra-ocular motions intact bilaterally with some mild decrease in lateral excursion with the left eye.  V,VII: mild left facial droop, facial light touch sensation normal bilaterally VIII: hearing normal bilaterally IX,X: gag reflex present XI: bilateral shoulder shrug XII: midline tongue extension Motor: Right :Upper extremity 5/5Left: Upper extremity 5-/5 Unable to lift either leg off the bed. Lower extremities edematous   Lab Results: Basic Metabolic Panel:  Recent Labs Lab 08/16/15 1929 08/17/15 0240 08/18/15 0508  NA 133* 133* 131*  K 4.2 4.1 3.6  CL 97* 97* 99*  CO2 25 27 24   GLUCOSE 101* 108* 111*  BUN 16  14 11   CREATININE 0.71 0.67 0.65  CALCIUM 9.1 8.9 8.2*    Liver Function Tests: No results for input(s): AST, ALT, ALKPHOS, BILITOT, PROT, ALBUMIN in the last 168 hours. No results for input(s): LIPASE, AMYLASE in the last 168 hours. No results for input(s): AMMONIA in the last 168 hours.  CBC:  Recent Labs Lab 08/16/15 1929 08/17/15 0240 08/18/15 0508  WBC 8.4 7.4 7.1  HGB 13.3 13.2 12.6*  HCT 39.5* 38.9* 36.8*  MCV 89.7 90.1 89.8  PLT 163 156 136*    Cardiac Enzymes: No results for input(s): CKTOTAL, CKMB, CKMBINDEX, TROPONINI in the last 168 hours.  Lipid Panel:  Recent Labs Lab 08/17/15 0240  CHOL 138  TRIG 52  HDL 41  CHOLHDL 3.4  VLDL 10  LDLCALC 87    CBG: No results for input(s): GLUCAP in the last 168 hours.  Microbiology: Results for orders placed or performed during the hospital encounter of 06/26/10  Culture, blood (routine x 2)     Status: None   Collection Time: 06/26/10  7:01 PM  Result Value Ref Range Status   Specimen Description BLOOD ARM LEFT  Final   Special Requests BOTTLES DRAWN AEROBIC ONLY [facility]  Final   Culture  Setup Time [id number]  Final   Culture NO GROWTH 5 DAYS  Final   Report Status 07/03/2010 FINAL  Final  Culture, blood (routine x 2)     Status: None   Collection Time: 06/26/10  7:08 PM  Result Value Ref Range Status   Specimen Description BLOOD ARM RIGHT  Final   Special Requests BOTTLES DRAWN AEROBIC ONLY [id number]  Final   Culture  Setup Time [id number]  Final   Culture NO GROWTH 5 DAYS  Final   Report Status 07/03/2010 FINAL  Final  Urine culture     Status: None   Collection Time: 06/26/10  9:44 PM  Result Value Ref Range Status   Specimen Description URINE, CATHETERIZED  Final   Special Requests NONE  Final   Culture  Setup Time [id number]  Final   Colony Count NO GROWTH  Final   Culture NO GROWTH  Final   Report Status 06/27/2010 FINAL  Final    Coagulation Studies: No results for input(s): LABPROT, INR in  the last 72 hours.  Imaging: Dg Chest 1 View  08/18/2015  CLINICAL DATA:  Lethargy, cough EXAM: CHEST 1 VIEW COMPARISON:  08/16/2015 FINDINGS: Cardiomediastinal silhouette is stable. No acute infiltrate or pleural effusion. No pulmonary edema. Stable chronic elevation of the left hemidiaphragm with left basilar atelectasis. Status post median sternotomy. IMPRESSION: No active disease. Chronic elevation of the left hemidiaphragm with left basilar atelectasis. Electronically Signed   By: Natasha MeadLiviu  Pop M.D.   On: 08/18/2015 15:53   Mr Brain Wo Contrast  08/17/2015  CLINICAL DATA:  82 year old male with stroke like symptoms, visual problems looking to the left. Initial encounter. EXAM: MRI HEAD WITHOUT CONTRAST MRA HEAD WITHOUT CONTRAST TECHNIQUE: Multiplanar, multiecho pulse sequences of the brain and surrounding structures were obtained without intravenous contrast. Angiographic images of the head were obtained using MRA technique without contrast. COMPARISON:  Head CT without contrast [facility]16[facility]2015[facility]. FINDINGS: MRI HEAD FINDINGS Major intracranial vascular flow voids are stable. No restricted diffusion to suggest acute infarction. No midline shift, mass effect, evidence of mass lesion, extra-axial collection or acute intracranial hemorrhage. Cervicomedullary junction and pituitary are within normal limits. Stable cerebral volume. Stable ventricular prominence without acute ventriculomegaly. Patchy periventricular T2 and FLAIR hyperintensity has not significantly changed. Regressed hemosiderin in the right lateral ventricle since the 2012 comparison. No other chronic cerebral blood products identified. T2 heterogeneity throughout the deep gray matter nuclei has not significantly changed an appears to be in large part due to perivascular spaces. Patchy T2 hyperintensity in the pons has not significantly changed. Stable and negative visualized internal  auditory structures. Small volume retained secretions in the nasopharynx. Stable and well pneumatized visualized paranasal sinuses and mastoids. Orbits soft tissues appear stable and normal aside from postoperative changes to the right globe. Negative scalp soft tissues. Negative for age visualized cervical spine. MRA HEAD FINDINGS Stable antegrade flow in the posterior circulation. MOTSA artifact suspected in the lower posterior fossa. No distal vertebral artery stenosis suspected. Bilateral PICA flow is visible. The vertebrobasilar junction and basilar artery remain normal without stenosis. SCA and PCA origins remain normal. Normal left posterior communicating artery, the right is diminutive or absent. Bilateral PCA branches are stable and within normal limits. Antegrade flow in both ICA siphons. No siphon stenosis. Ophthalmic and left posterior communicating artery origins remain normal. Carotid termini remain patent. MCA and ACA origins remain normal. Anterior communicating artery and visualized ACA branches are stable and within normal limits. Visualized bilateral MCA branches are stable and normal aside from mild tortuosity. IMPRESSION: 1.  No acute intracranial abnormality. 2. Expected evolution of the small volume intraventricular hemorrhage on the right in 2012. Otherwise stable noncontrast MRI appearance the brain, including chronic signal changes felt to be small vessel disease related. 3.  Stable and negative intracranial MRA. Electronically Signed   By: Odessa Fleming M.D.   On: 08/17/2015 10:21   Mr Brain W Contrast  08/18/2015  CLINICAL DATA:  Acute encephalopathy. Patient not acting RIGHT, lethargic. EXAM: MRI HEAD WITH CONTRAST TECHNIQUE: Multiplanar, multiecho pulse sequences of the brain and surrounding structures were obtained with intravenous contrast. COMPARISON:  MR brain without contrast 08/17/2015. CONTRAST:  20mL MULTIHANCE GADOBENATE DIMEGLUMINE 529 MG/ML IV SOLN FINDINGS: No abnormal  enhancement of the brain or meninges. Major dural venous sinuses are patent. Atrophy and small vessel disease, better evaluated on prior MR. No concerning features when correlated with yesterday's MR. IMPRESSION: No acute abnormalities are seen on post infusion imaging. Electronically Signed   By: Elsie Stain M.D.   On: 08/18/2015 15:15   US Carotid Bilateral  08/17/2015  CLINICAL DATA:  CVA. EXAM: BILATERAL CAROTID DUPLEX ULTRASOUND TECHNIQUE: Wallace Cullens scale imaging, color Doppler and duplex ultrasound were performed of bilateral carotid and vertebral arteries in the neck. COMPARISON:  MRI 08/17/2015 . FINDINGS: Criteria: Quantification of carotid stenosis is based on velocity parameters that correlate the residual internal carotid diameter with NASCET-based stenosis levels, using the diameter of the distal internal carotid lumen as the denominator for stenosis measurement. The following velocity measurements were obtained: RIGHT ICA:  72/12 cm/sec CCA:  89/12 cm/sec SYSTOLIC ICA/CCA RATIO:  0.8 DIASTOLIC ICA/CCA RATIO:  1.1 ECA:  59 cm/sec LEFT ICA:  57/14 cm/sec CCA:  73/12 cm/sec SYSTOLIC ICA/CCA RATIO:  0.8 DIASTOLIC ICA/CCA RATIO:  1.1 ECA:  34 cm/sec RIGHT CAROTID ARTERY: No significant carotid atherosclerotic vascular disease. No flow limiting stenosis. RIGHT VERTEBRAL ARTERY:  Patent with antegrade flow. LEFT CAROTID ARTERY: Mild left carotid bifurcation proximal ICA atherosclerotic vascular plaque. No flow limiting stenosis. LEFT VERTEBRAL ARTERY:  Patent with antegrade flow. IMPRESSION: 1. Mild left carotid bifurcation proximal ICA calcified atherosclerotic vascular plaque. No flow limiting stenosis. Degree of stenosis less than 50%. Right carotid widely patent. 2.  Vertebral arteries are patent with antegrade flow. Electronically Signed   By: Maisie Fus  Register   On: 08/17/2015 10:48   Mr Maxine Glenn Head/brain Wo Cm  08/17/2015  CLINICAL DATA:  82 year old male with stroke like symptoms, visual problems  looking to the left. Initial encounter. EXAM: MRI HEAD WITHOUT CONTRAST MRA HEAD WITHOUT CONTRAST TECHNIQUE: Multiplanar, multiecho pulse sequences of the brain and surrounding structures were obtained without intravenous contrast. Angiographic images of the head were obtained using MRA technique without contrast. COMPARISON:  Head CT without contrast 08/16/2015. [facility] Brain MRI and intracranial MRA 06/27/2010. FINDINGS: MRI HEAD FINDINGS Major intracranial vascular flow voids are stable. No restricted diffusion to suggest acute infarction. No midline shift, mass effect, evidence of mass lesion, extra-axial collection or acute intracranial hemorrhage. Cervicomedullary junction and pituitary are within normal limits. Stable cerebral volume. Stable ventricular prominence without acute ventriculomegaly. Patchy periventricular T2 and FLAIR hyperintensity has not significantly changed. Regressed hemosiderin in the right lateral ventricle since the 2012 comparison. No other chronic cerebral blood products identified. T2 heterogeneity throughout the deep gray matter nuclei has not significantly changed an appears to be in large part due to perivascular spaces. Patchy T2 hyperintensity in the pons has not significantly changed. Stable and negative visualized internal auditory structures. Small volume retained secretions in the nasopharynx. Stable and well pneumatized visualized paranasal sinuses and mastoids. Orbits soft tissues appear stable and normal aside from postoperative changes to the right globe. Negative scalp soft tissues. Negative for age visualized cervical spine. MRA HEAD  FINDINGS Stable antegrade flow in the posterior circulation. MOTSA artifact suspected in the lower posterior fossa. No distal vertebral artery stenosis suspected. Bilateral PICA flow is visible. The vertebrobasilar junction and basilar artery remain normal without stenosis. SCA and PCA origins remain normal. Normal left  posterior communicating artery, the right is diminutive or absent. Bilateral PCA branches are stable and within normal limits. Antegrade flow in both ICA siphons. No siphon stenosis. Ophthalmic and left posterior communicating artery origins remain normal. Carotid termini remain patent. MCA and ACA origins remain normal. Anterior communicating artery and visualized ACA branches are stable and within normal limits. Visualized bilateral MCA branches are stable and normal aside from mild tortuosity. IMPRESSION: 1.  No acute intracranial abnormality. 2. Expected evolution of the small volume intraventricular hemorrhage on the right in 2012. Otherwise stable noncontrast MRI appearance the brain, including chronic signal changes felt to be small vessel disease related. 3. Stable and negative intracranial MRA. Electronically Signed   By: Odessa Fleming M.D.   On: 08/17/2015 10:21    Medications:  I have reviewed the patient's current medications. Scheduled: . aspirin EC  81 mg Oral Daily  . calcium carbonate  2 tablet Oral Daily  . enoxaparin (LOVENOX) injection  40 mg Subcutaneous Q24H  . fluticasone  1 spray Each Nare Daily  . levofloxacin (LEVAQUIN) IV  500 mg Intravenous Once  . [START ON 08/20/2015] levofloxacin (LEVAQUIN) IV  500 mg Intravenous Q24H    Assessment/Plan: Vision improved but continues to have some visual field deficits. EEG pending.  Hyponatremic.  Sedimentation rate only slightly elevated but CRP elevated at 2.0.  B12 normal.    Pt's vision has slightly improved as per family at bedside.  Poor vision in L eye.  Pt is behaving like Anton's Syndrome with confabulation but no radiographic findings.  Sometimes early onset PRESS can give you such abnormality even though no radiological findings are seen. Since symptoms improving I suspect  he will continue to improve.   If stagnant or not improving any more I would consider repeating MRI brain to look at the Occipital lobes.    Pauletta Browns

## 2015-08-19 NOTE — Plan of Care (Signed)
Called Dr. Luberta MutterKonidena - alerted by Nurse Tech's that pt expelled white, mucousy fluid from rectum.  Dr. Was ok as long as no blood was present.

## 2015-08-19 NOTE — Progress Notes (Signed)
Patient ID: [name] [name], male   DOB: 28-Feb-1922, 82 y.o.   MRN: [id number] [facility] PROGRESS NOTE  [name] [name]:[id number] DOB: Jun 18, 1921 DOA: 08/16/2015 PCP: [name], MD  HPI/Subjective: Patient is seen at the bedside. He is alert and awake oriented. Does have some cough and phlegm. And also low-grade fever this morning. No hypoxia. Daughter is concerned about his vision. Discussed all the test results in detail and spoke to her about 20 minutes.   Objective: Filed Vitals:   08/19/15 0210 08/19/15 0619  BP: 149/72 137/66  Pulse: 83 77  Temp: 97.7 F (36.5 C) 99.1 F (37.3 C)  Resp: 18 18    Filed Weights   08/16/15 1914 08/17/15 0300  Weight: 95.255 kg (210 lb) 94.008 kg (207 lb 4 oz)    ROS: Review of Systems  Constitutional: Negative for fever and chills.  Eyes: Negative for blurred vision.  Respiratory: Positive for cough. Negative for shortness of breath.   Cardiovascular: Negative for chest pain.  Gastrointestinal: Negative for nausea, vomiting, abdominal pain, diarrhea and constipation.  Genitourinary: Negative for dysuria.  Musculoskeletal: Negative for joint pain.  Neurological: Negative for dizziness and headaches.   Exam: Physical Exam  HENT:  Nose: No mucosal edema.  Mouth/Throat: No oropharyngeal exudate or posterior oropharyngeal edema.  Eyes: Conjunctivae, EOM and lids are normal. Pupils are equal, round, and reactive to light.  Neck: No JVD present. Carotid bruit is not present. No edema present. No thyroid mass and no thyromegaly present.  Cardiovascular: S1 normal and S2 normal.  Exam reveals no gallop.   Murmur heard.  Systolic murmur is present with a grade of 2/6  Pulses:      Dorsalis pedis pulses are 1+ on the right side, and 1+ on the left side.  Respiratory: No respiratory distress. He has no wheezes. He has no rhonchi. He has no rales.  GI: Soft. Bowel sounds are normal. There is no tenderness.  Musculoskeletal:        Right ankle: He exhibits swelling.       Left ankle: He exhibits swelling.  Lymphadenopathy:    He has no cervical adenopathy.  Neurological: He is alert.  Left eye still does not go all the way to the left. He states vision is okay  Skin: Skin is warm. No rash noted. Nails show no clubbing.  Psychiatric: His affect is blunt.      Data Reviewed: Basic Metabolic Panel:  Recent Labs Lab 08/16/15 1929 08/17/15 0240 08/18/15 0508  NA 133* 133* 131*  K 4.2 4.1 3.6  CL 97* 97* 99*  CO2 25 27 24   GLUCOSE 101* 108* 111*  BUN 16 14 11   CREATININE 0.71 0.67 0.65  CALCIUM 9.1 8.9 8.2*   CBC:  Recent Labs Lab 08/16/15 1929 08/17/15 0240 08/18/15 0508  WBC 8.4 7.4 7.1  HGB 13.3 13.2 12.6*  HCT 39.5* 38.9* 36.8*  MCV 89.7 90.1 89.8  PLT 163 156 136*   BNP (last 3 results)  Recent Labs  08/03/15 1125  BNP 154.0*      Studies: Dg Chest 1 View  08/18/2015  CLINICAL DATA:  Lethargy, cough EXAM: CHEST 1 VIEW COMPARISON:  08/16/2015 FINDINGS: Cardiomediastinal silhouette is stable. No acute infiltrate or pleural effusion. No pulmonary edema. Stable chronic elevation of the left hemidiaphragm with left basilar atelectasis. Status post median sternotomy. IMPRESSION: No active disease. Chronic elevation of the left hemidiaphragm with left basilar atelectasis. Electronically Signed   By: Lang Snow  Pop M.D.   On: 08/18/2015 15:53   Mr Brain Wo Contrast  08/17/2015  CLINICAL DATA:  82 year old male with stroke like symptoms, visual problems looking to the left. Initial encounter. EXAM: MRI HEAD WITHOUT CONTRAST MRA HEAD WITHOUT CONTRAST TECHNIQUE: Multiplanar, multiecho pulse sequences of the brain and surrounding structures were obtained without intravenous contrast. Angiographic images of the head were obtained using MRA technique without contrast. COMPARISON:  Head CT without contrast 08/16/2015. [facility] Brain MRI and intracranial MRA 06/27/2010. FINDINGS: MRI HEAD FINDINGS  Major intracranial vascular flow voids are stable. No restricted diffusion to suggest acute infarction. No midline shift, mass effect, evidence of mass lesion, extra-axial collection or acute intracranial hemorrhage. Cervicomedullary junction and pituitary are within normal limits. Stable cerebral volume. Stable ventricular prominence without acute ventriculomegaly. Patchy periventricular T2 and FLAIR hyperintensity has not significantly changed. Regressed hemosiderin in the right lateral ventricle since the 2012 comparison. No other chronic cerebral blood products identified. T2 heterogeneity throughout the deep gray matter nuclei has not significantly changed an appears to be in large part due to perivascular spaces. Patchy T2 hyperintensity in the pons has not significantly changed. Stable and negative visualized internal auditory structures. Small volume retained secretions in the nasopharynx. Stable and well pneumatized visualized paranasal sinuses and mastoids. Orbits soft tissues appear stable and normal aside from postoperative changes to the right globe. Negative scalp soft tissues. Negative for age visualized cervical spine. MRA HEAD FINDINGS Stable antegrade flow in the posterior circulation. MOTSA artifact suspected in the lower posterior fossa. No distal vertebral artery stenosis suspected. Bilateral PICA flow is visible. The vertebrobasilar junction and basilar artery remain normal without stenosis. SCA and PCA origins remain normal. Normal left posterior communicating artery, the right is diminutive or absent. Bilateral PCA branches are stable and within normal limits. Antegrade flow in both ICA siphons. No siphon stenosis. Ophthalmic and left posterior communicating artery origins remain normal. Carotid termini remain patent. MCA and ACA origins remain normal. Anterior communicating artery and visualized ACA branches are stable and within normal limits. Visualized bilateral MCA branches are stable  and normal aside from mild tortuosity. IMPRESSION: 1.  No acute intracranial abnormality. 2. Expected evolution of the small volume intraventricular hemorrhage on the right in 2012. Otherwise stable noncontrast MRI appearance the brain, including chronic signal changes felt to be small vessel disease related. 3. Stable and negative intracranial MRA. Electronically Signed   By: Odessa FlemingH  Hall M.D.   On: 08/17/2015 10:21   Mr Brain W Contrast  08/18/2015  CLINICAL DATA:  Acute encephalopathy. Patient not acting RIGHT, lethargic. EXAM: MRI HEAD WITH CONTRAST TECHNIQUE: Multiplanar, multiecho pulse sequences of the brain and surrounding structures were obtained with intravenous contrast. COMPARISON:  MR brain without contrast 08/17/2015. CONTRAST:  20mL MULTIHANCE GADOBENATE DIMEGLUMINE 529 MG/ML IV SOLN FINDINGS: No abnormal enhancement of the brain or meninges. Major dural venous sinuses are patent. Atrophy and small vessel disease, better evaluated on prior MR. No concerning features when correlated with yesterday's MR. IMPRESSION: No acute abnormalities are seen on post infusion imaging. Electronically Signed   By: Elsie StainJohn T Curnes M.D.   On: 08/18/2015 15:15   [place] Carotid Bilateral  08/17/2015  CLINICAL DATA:  CVA. EXAM: BILATERAL CAROTID DUPLEX ULTRASOUND TECHNIQUE: Wallace CullensGray scale imaging, color Doppler and duplex ultrasound were performed of bilateral carotid and vertebral arteries in the neck. COMPARISON:  MRI 08/17/2015 . FINDINGS: Criteria: Quantification of carotid stenosis is based on velocity parameters that correlate the residual internal carotid diameter with NASCET-based  stenosis levels, using the diameter of the distal internal carotid lumen as the denominator for stenosis measurement. The following velocity measurements were obtained: RIGHT ICA:  72/12 cm/sec CCA:  89/12 cm/sec SYSTOLIC ICA/CCA RATIO:  0.8 DIASTOLIC ICA/CCA RATIO:  1.1 ECA:  59 cm/sec LEFT ICA:  57/14 cm/sec CCA:  73/12 cm/sec SYSTOLIC ICA/CCA  RATIO:  0.8 DIASTOLIC ICA/CCA RATIO:  1.1 ECA:  34 cm/sec RIGHT CAROTID ARTERY: No significant carotid atherosclerotic vascular disease. No flow limiting stenosis. RIGHT VERTEBRAL ARTERY:  Patent with antegrade flow. LEFT CAROTID ARTERY: Mild left carotid bifurcation proximal ICA atherosclerotic vascular plaque. No flow limiting stenosis. LEFT VERTEBRAL ARTERY:  Patent with antegrade flow. IMPRESSION: 1. Mild left carotid bifurcation proximal ICA calcified atherosclerotic vascular plaque. No flow limiting stenosis. Degree of stenosis less than 50%. Right carotid widely patent. 2.  Vertebral arteries are patent with antegrade flow. Electronically Signed   By: Maisie Fus  Register   On: 08/17/2015 10:48   Mr Maxine Glenn Head/brain Wo Cm  08/17/2015  CLINICAL DATA:  82 year old male with stroke like symptoms, visual problems looking to the left. Initial encounter. EXAM: MRI HEAD WITHOUT CONTRAST MRA HEAD WITHOUT CONTRAST TECHNIQUE: Multiplanar, multiecho pulse sequences of the brain and surrounding structures were obtained without intravenous contrast. Angiographic images of the head were obtained using MRA technique without contrast. COMPARISON:  Head CT without contrast 08/16/2015. [facility] Brain MRI and intracranial MRA 06/27/2010. FINDINGS: MRI HEAD FINDINGS Major intracranial vascular flow voids are stable. No restricted diffusion to suggest acute infarction. No midline shift, mass effect, evidence of mass lesion, extra-axial collection or acute intracranial hemorrhage. Cervicomedullary junction and pituitary are within normal limits. Stable cerebral volume. Stable ventricular prominence without acute ventriculomegaly. Patchy periventricular T2 and FLAIR hyperintensity has not significantly changed. Regressed hemosiderin in the right lateral ventricle since the 2012 comparison. No other chronic cerebral blood products identified. T2 heterogeneity throughout the deep gray matter nuclei has not significantly  changed an appears to be in large part due to perivascular spaces. Patchy T2 hyperintensity in the pons has not significantly changed. Stable and negative visualized internal auditory structures. Small volume retained secretions in the nasopharynx. Stable and well pneumatized visualized paranasal sinuses and mastoids. Orbits soft tissues appear stable and normal aside from postoperative changes to the right globe. Negative scalp soft tissues. Negative for age visualized cervical spine. MRA HEAD FINDINGS Stable antegrade flow in the posterior circulation. MOTSA artifact suspected in the lower posterior fossa. No distal vertebral artery stenosis suspected. Bilateral PICA flow is visible. The vertebrobasilar junction and basilar artery remain normal without stenosis. SCA and PCA origins remain normal. Normal left posterior communicating artery, the right is diminutive or absent. Bilateral PCA branches are stable and within normal limits. Antegrade flow in both ICA siphons. No siphon stenosis. Ophthalmic and left posterior communicating artery origins remain normal. Carotid termini remain patent. MCA and ACA origins remain normal. Anterior communicating artery and visualized ACA branches are stable and within normal limits. Visualized bilateral MCA branches are stable and normal aside from mild tortuosity. IMPRESSION: 1.  No acute intracranial abnormality. 2. Expected evolution of the small volume intraventricular hemorrhage on the right in 2012. Otherwise stable noncontrast MRI appearance the brain, including chronic signal changes felt to be small vessel disease related. 3. Stable and negative intracranial MRA. Electronically Signed   By: Odessa Fleming M.D.   On: 08/17/2015 10:21    Scheduled Meds: . aspirin EC  81 mg Oral Daily  . calcium carbonate  2 tablet  Oral Daily  . enoxaparin (LOVENOX) injection  40 mg Subcutaneous Q24H  . fluticasone  1 spray Each Nare Daily   Continuous Infusions:     Assessment/Plan:  1. Acute encephalopathy. Unclear cause at this time. Family does not want lumbar puncture. Patient mental status improved. Physical therapy recommended SNF placement. All the workup has been negative so far including MRI of the brain, ultrasound carotids, EKG, echocardiogram, EEG.  discontinue IV hydration, discontinue IV PPIs, ambulate the patient with physical therapy.  Lower extremity edema. Family has been hesitant before about diuretic.  4.   History of CAD on aspirin 5.   Deconditioning. PT rec rehab and family is interested  #6. Cough,  left-sided pneumonia: Continue incentive spirometry ,because of low-grade fever started empiric Levaquin. I discussed this with patient's daughter and she is agreeable for this. More than 50% of time spent in counseling and coordination of  Services. Code Status:     Code Status Orders        Start     Ordered   08/17/15 0113  Full code   Continuous     08/17/15 0114    Code Status History    Date Active Date Inactive Code Status Order ID Comments User Context   08/17/2015  1:14 AM  Full Code [id number]  Joella Prince, MD ED    Advance Directive Documentation        Most Recent Value   Type of Advance Directive  Healthcare Power of Attorney   Pre-existing out of facility DNR order (yellow form or pink MOST form)     "MOST" Form in Place?       Family Communication: Spoke with Family at the bedside Disposition Plan: Likely to rehab.  Case discussed with Neurology and opthalmology  Consultants:  Neurology  Opthalmology  Time spent: 35 minutes  [facility]

## 2015-08-19 NOTE — Clinical Social Work Placement (Signed)
   CLINICAL SOCIAL WORK PLACEMENT  NOTE  Date:  08/19/2015  Patient Details  Name: [name] [name] MRN: [id number] Date of Birth: 09/06/1921  Clinical Social Work is seeking post-discharge placement for this patient at the Skilled  Nursing Facility level of care (*CSW will initial, date and re-position this form in  chart as items are completed):  Yes   Patient/family provided with [facility] Clinical Social Work Department's list of facilities offering this level of care within the geographic area requested by the patient (or if unable, by the patient's family).  Yes   Patient/family informed of their freedom to choose among providers that offer the needed level of care, that participate in Medicare, Medicaid or managed care program needed by the patient, have an available bed and are willing to accept the patient.  Yes   Patient/family informed of [facility] ownership interest [facility] and [facility], as well as of the fact that they are under no obligation to receive care at these facilities.  PASRR submitted to EDS on 08/19/15     PASRR number received on 08/19/15     Existing PASRR number confirmed on       FL2 transmitted to all facilities in geographic area requested by pt/family on 08/19/15     FL2 transmitted to all facilities within larger geographic area on       Patient informed that his/her managed care company has contracts with or will negotiate with certain facilities, including the following:        Yes   Patient/family informed of bed offers received.  Patient chooses bed at       Physician recommends and patient chooses bed at      Patient to be transferred to   on  .  Patient to be transferred to facility by       Patient family notified on   of transfer.  Name of family member notified:        PHYSICIAN       Additional Comment:    _______________________________________________ Haig ProphetMorgan, Sulayman Manning G, LCSW 08/19/2015, 1:39 PM

## 2015-08-19 NOTE — Progress Notes (Signed)
Physical Therapy Treatment Patient Details Name: [name] [name] MRN: [id number] DOB: April 25, 1921 Today's Date: 08/19/2015    History of Present Illness 82 y.o. male past medical history of hyperlipidemia, coronary artery disease status post CABG, prior history of TIA and peripheral artery disease only on a baby aspirin. Came to hospital because he was complaining of some trouble with eye tracking tonight he was unable to look towards the left. Pt with some stroke-like symptoms, CT and MRI negative.    PT Comments    Pt again shows good attitude and motivation to work but was weaker and more limited today, especially with attempts at standing.  He simply was unable to get his hips forward over his BOS and despite heavy assist and cuing, multiple attempts and great effort he needed max/total assist just to remain off the bed much less upright.   Follow Up Recommendations  SNF     Equipment Recommendations       Recommendations for Other Services       Precautions / Restrictions Precautions Precautions: Fall Restrictions Weight Bearing Restrictions: No    Mobility  Bed Mobility Overal bed mobility: Needs Assistance Bed Mobility: Supine to Sit;Sit to Supine     Supine to sit: Max assist Sit to supine: Max assist   General bed mobility comments: Pt shows more alertness and effort today, but is leaning heavily back and to the L and is unable to really get himself situated "upright" at the EOB.  Pt is unable to consistently use UEs to effectively assist with stability/balance  Transfers Overall transfer level: Needs assistance Equipment used: Rolling walker (2 wheeled) Transfers: Sit to/from Stand Sit to Stand: Total assist         General transfer comment: attempted to get to standing X 3 today and pt simply unable to get his hips forward/off the bed despite heavy cuing, raising the bed, multiple hand placement issues and heavy encouragement from daughter.  Any time spent in a  partially upright position was heavily supported with great effort by PT.    Ambulation/Gait             General Gait Details: Pt completely unable to get to standing, unable to attempt even taking a step   Stairs            Wheelchair Mobility    Modified Rankin (Stroke Patients Only)       Balance Overall balance assessment: Needs assistance Sitting-balance support: Bilateral upper extremity supported Sitting balance-Leahy Scale: Poor Sitting balance - Comments: Pt less steady today sitting at EOB, leaning back and to the L   Standing balance support: Bilateral upper extremity supported Standing balance-Leahy Scale: Zero Standing balance comment: Pt needing excessive assist to even try to get to standing, never able to get to upright                    Cognition Arousal/Alertness: Awake/alert Behavior During Therapy: WFL for tasks assessed/performed Overall Cognitive Status: History of cognitive impairments - at baseline                      Exercises General Exercises - Lower Extremity Ankle Circles/Pumps: Strengthening;10 reps;Both Heel Slides: Strengthening;10 reps;Both Hip ABduction/ADduction: 10 reps;Both;AROM Straight Leg Raises: AAROM;10 reps;Both    General Comments        Pertinent Vitals/Pain Pain Assessment: No/denies pain (reports mild R shoulder pain)    Home Living  Prior Function            PT Goals (current goals can now be found in the care plan section) Progress towards PT goals:  (pt more limited with mobility/standing today)    Frequency  Min 2X/week    PT Plan Current plan remains appropriate    Co-evaluation             End of Session Equipment Utilized During Treatment: Gait belt Activity Tolerance: Patient limited by fatigue Patient left: with bed alarm set;with call bell/phone within reach;with family/visitor present     Time: [id number] PT Time Calculation  (min) (ACUTE ONLY): 26 min  Charges:  $Therapeutic Exercise: 8-22 mins $Therapeutic Activity: 8-22 mins                    G Codes:      Malachi ProGalen R Saben Donigan, DPT  08/19/2015, 4:08 PM

## 2015-08-19 NOTE — Clinical Social Work Note (Signed)
Clinical Social Work Assessment  Patient Details  Name: [name] [name] MRN: [id number] Date of Birth: 10/21/1921  Date of referral:  08/19/15               Reason for consult:  Facility Placement                Permission sought to share information with:  Chartered certified accountant granted to share information::  Yes, Verbal Permission Granted  Name::      [place]::   [place]   Relationship::     Contact Information:     Housing/Transportation Living arrangements for the past 2 months:  [place] of Information:  Patient, Adult Children Patient Interpreter Needed:  None Criminal Activity/Legal Involvement Pertinent to Current Situation/Hospitalization:  No - Comment as needed Significant Relationships:  Adult Children, Spouse Lives with:  Spouse Do you feel safe going back to the place where you live?  Yes Need for family participation in patient care:  Yes (Comment)  Care giving concerns:  Patient lives with his wife in [place].    Social Worker assessment / plan:  Holiday representative (CSW) met with patient and his daughter [name] was at bedside. CSW introduced self and explained role of CSW department. Patient reported that he lives with his wife in [place]. Patient and daughter are agreeable to SNF search and prefer [employer] FL2 complete and faxed out. CSW presented bed offers to daughter and patient. Daughter requested [facility] however [facility] has not responded to referral. Daughter chose [facility] in case [facility] does not offer a bed. CSW will continue to follow and assist as needed.   Employment status:  Disabled (Comment on whether or not currently receiving Disability), Retired Nurse, adult PT Recommendations:  [place] / Referral to community resources:  [place]  Patient/Family's Response to care:  Daughter chose [facility] in case [facility] does not have a bed.   Patient/Family's Understanding of and Emotional Response to Diagnosis, Current Treatment, and Prognosis:  Patient and daughter were pleasant and thanked CSW for visit.   Emotional Assessment Appearance:  Appears stated age Attitude/Demeanor/Rapport:    Affect (typically observed):  Accepting, Adaptable, Pleasant Orientation:  Oriented to Self, Oriented to Place, Oriented to  Time, Oriented to Situation Alcohol / Substance use:  Not Applicable Psych involvement (Current and /or in the community):  No (Comment)  Discharge Needs  Concerns to be addressed:  Discharge Planning Concerns Readmission within the last 30 days:  No Current discharge risk:  Dependent with Mobility Barriers to Discharge:  Continued Medical Work up   Loralyn Freshwater, LCSW 08/19/2015, 1:40 PM

## 2015-08-19 NOTE — Progress Notes (Signed)
Speech Therapy Note: reviewed chart notes and labs(no decline); consulted NSG re: pt's status today. NSG reported pt's vision is improving, noted MD notes. There has been no report of dysphagia; NSG reported good toleration of meds and meals thus far. ST will f/u w/ pt's status on Monday for any further education as needed. NSG agreed.

## 2015-08-20 ENCOUNTER — Inpatient Hospital Stay: Payer: Medicare Other

## 2015-08-20 LAB — CBC
HCT: 36.4 % — ABNORMAL LOW (ref 40.0–52.0)
Hemoglobin: 12.2 g/dL — ABNORMAL LOW (ref 13.0–18.0)
MCH: 30.3 pg (ref 26.0–34.0)
MCHC: 33.5 g/dL (ref 32.0–36.0)
MCV: 90.5 fL (ref 80.0–100.0)
PLATELETS: 150 10*3/uL (ref 150–440)
RBC: 4.02 MIL/uL — AB (ref 4.40–5.90)
RDW: 14 % (ref 11.5–14.5)
WBC: 6.2 10*3/uL (ref 3.8–10.6)

## 2015-08-20 LAB — BASIC METABOLIC PANEL
ANION GAP: 7 (ref 5–15)
BUN: 13 mg/dL (ref 6–20)
CO2: 27 mmol/L (ref 22–32)
Calcium: 8.1 mg/dL — ABNORMAL LOW (ref 8.9–10.3)
Chloride: 101 mmol/L (ref 101–111)
Creatinine, Ser: 0.58 mg/dL — ABNORMAL LOW (ref 0.61–1.24)
GFR calc Af Amer: >60 mL/min (ref >60)
GLUCOSE: 106 mg/dL — AB (ref 65–99)
POTASSIUM: 3.3 mmol/L — AB (ref 3.5–5.1)
Sodium: 135 mmol/L (ref 135–145)

## 2015-08-20 LAB — CKMB (ARMC ONLY): CK, MB: 4.4 ng/mL (ref 0.5–5.0)

## 2015-08-20 LAB — TROPONIN I: Troponin I: <0.03 ng/mL (ref <0.031)

## 2015-08-20 MED ORDER — AMIODARONE HCL 200 MG PO TABS
200.0000 mg | ORAL_TABLET | Freq: Every day | ORAL | Status: DC
  Administered 2015-08-20 – 2015-08-22 (×3): 200 mg via ORAL
  Filled 2015-08-20 (×4): qty 1

## 2015-08-20 MED ORDER — NITROGLYCERIN 2 % TD OINT
0.5000 [in_us] | TOPICAL_OINTMENT | Freq: Four times a day (QID) | TRANSDERMAL | Status: DC
  Administered 2015-08-20 (×2): 0.5 [in_us] via TOPICAL
  Filled 2015-08-20 (×3): qty 1

## 2015-08-20 MED ORDER — POTASSIUM CHLORIDE CRYS ER 20 MEQ PO TBCR
40.0000 meq | EXTENDED_RELEASE_TABLET | Freq: Once | ORAL | Status: AC
  Administered 2015-08-20: 40 meq via ORAL
  Filled 2015-08-20: qty 2

## 2015-08-20 MED ORDER — MORPHINE SULFATE (PF) 2 MG/ML IV SOLN: 1.0000 mg | INTRAVENOUS | Status: DC | PRN

## 2015-08-20 MED ORDER — BENZONATATE 100 MG PO CAPS
100.0000 mg | ORAL_CAPSULE | Freq: Three times a day (TID) | ORAL | Status: DC | PRN
Start: 2015-08-20 — End: 2015-08-22
  Administered 2015-08-20 – 2015-08-22 (×5): 100 mg via ORAL
  Filled 2015-08-20 (×5): qty 1

## 2015-08-20 MED ORDER — APIXABAN 5 MG PO TABS
5.0000 mg | ORAL_TABLET | Freq: Two times a day (BID) | ORAL | Status: DC
  Administered 2015-08-20 – 2015-08-22 (×4): 5 mg via ORAL
  Filled 2015-08-20 (×4): qty 1

## 2015-08-20 NOTE — H&P (Signed)
[name] [name] is a 82 y.o. male  [id number]  Primary Cardiologist: Adrian Blackwater Reason for Consultation: Atrial fibrillation  HPI: 82 year old white male with a past medical history of CABG but no history of prior atrial fibrillation presented to the hospital with possible CVA. According to the daughter he was very confused and gazing up forward to the ceiling and was brought to the hospital last Wednesday. Patient also has intermittent left jaw pain but no chest pain. Neurological workup including MRI and EEG were unremarkable for CVA. This morning patient had on monitor atrial fibrillation with ventricular rate about 160. But EKG done twice has normal sinus rhythm.   Review of Systems: No orthopnea PND or leg swelling   Past Medical History  Diagnosis Date  . CAD (coronary artery disease)     nuclear 01/2008, no ischemia, EF 60%, distal anterior scar  . Hyperlipidemia   . Syncope     .remote..no etiology  /  syncope 11/2008.. from acute leg pain  . Venous insufficiency     support hose  . Carotid artery disease (HCC)     doppler 05/2009,  0-39% bilateral / doppler 06/2010 OK  . Elevated CPK     11/23/08.Marland KitchenMarland Kitchenprobably from debris from popliteal aneurysm  . Popliteal aneurysm (HCC)     .repair..11/25/2008  Dr. Darrick Penna  . Speech problem     Neurologic event with brief infusion and speech difficulty.... January, 2011.... being assessed by neurology  February, 2011  . Hx of CABG     1998  . Ejection fraction     EF 45%, echo,05/2009,apical hypo,  ?? posterior and lateral hypo  . Prominent abdominal aortic pulsation     doppler normal  . Intracranial hemorrhage (HCC)     Two small subependymal bleeds 06/2010,,ASA held  . Bradycardia     06/2010  . Edema     Ankle edema August, 2012  . TIA (transient ischemic attack)     Brief episode of confusion and speech difficulty, January, 2012  /  recurrent confusion speech difficulty September, 2012  . PAD (peripheral artery disease) (HCC)      Above the knee to below the knee popliteal bypass,, right  //    Doppler,  July, 2013, patent  . Fall at home July 2015    Medications Prior to Admission  Medication Sig Dispense Refill  . aspirin EC 81 MG tablet Take 81 mg by mouth daily.    . calcium carbonate (TUMS - DOSED IN MG ELEMENTAL CALCIUM) 500 MG chewable tablet Chew 2 tablets by mouth daily.    Marland Kitchen docusate sodium (COLACE) 100 MG capsule Take 100 mg by mouth 2 (two) times daily as needed for mild constipation.    . Loratadine (CLARITIN REDITABS) 5 MG TBDP Take by mouth 2 (two) times daily as needed.    . Multiple Vitamin (MULTIVITAMIN WITH MINERALS) TABS tablet Take 1 tablet by mouth daily.       Marland Kitchen aspirin EC  81 mg Oral Daily  . calcium carbonate  2 tablet Oral Daily  . enoxaparin (LOVENOX) injection  40 mg Subcutaneous Q24H  . fluticasone  1 spray Each Nare Daily  . nitroGLYCERIN  0.5 inch Topical Q6H    Infusions:    Allergies  Allergen Reactions  . Penicillins Hives    Has patient had a PCN reaction causing immediate rash, facial/tongue/throat swelling, SOB or lightheadedness with hypotension: Yes Has patient had a PCN reaction causing severe rash involving mucus membranes  or skin necrosis: Yes Has patient had a PCN reaction that required hospitalization No Has patient had a PCN reaction occurring within the last 10 years: No If all of the above answers are "NO", then may proceed with Cephalosporin use.   . Latex Rash  . Sulfonamide Derivatives Rash    Social History   Social History  . Marital Status: Married    Spouse Name: N/A  . Number of Children: N/A  . Years of Education: N/A   Occupational History  . Not on file.   Social History Main Topics  . Smoking status: Former Smoker -- 20 years    Types: Cigarettes    Quit date: 09/12/1946  . Smokeless tobacco: Never Used     Comment: quit appox 50 yrs ago  . Alcohol Use: No  . Drug Use: No  . Sexual Activity: Not on file   Other Topics Concern   . Not on file   Social History Narrative    Family History  Problem Relation Age of Onset  . Diabetes Sister   . Stroke Mother   . Diabetes Mother     PHYSICAL EXAM: Filed Vitals:   08/20/15 0108 08/20/15 0514  BP: 127/84 116/67  Pulse: 126 69  Temp: 98.9 F (37.2 C) 98.2 F (36.8 C)  Resp: 16 17     Intake/Output Summary (Last 24 hours) at 08/20/15 1137 Last data filed at 08/20/15 0930  Gross per 24 hour  Intake    240 ml  Output      0 ml  Net    240 ml    General:  Well appearing. No respiratory difficulty HEENT: normal Neck: supple. no JVD. Carotids 2+ bilat; no bruits. No lymphadenopathy or thryomegaly appreciated. Cor: PMI nondisplaced. Regular rate & rhythm. No rubs, gallops or murmurs. Lungs: clear Abdomen: soft, nontender, nondistended. No hepatosplenomegaly. No bruits or masses. Good bowel sounds. Extremities: no cyanosis, clubbing, rash, edema Neuro: alert & oriented x 3, cranial nerves grossly intact. moves all 4 extremities w/o difficulty. Affect pleasant.  UXL:KGMWN rhythm nonspecific ST-T changes  Results for orders placed or performed during the hospital encounter of 08/16/15 (from the past 24 hour(s))  Urinalysis complete, with microscopic ([facility] only)     Status: Abnormal   Collection Time: 08/19/15 11:45 AM  Result Value Ref Range   Color, Urine (A) YELLOW    QUESTIONABLE IDENTIFICATION / INCORRECTLY LABELED SPECIMEN   APPearance (A) CLEAR    QUESTIONABLE IDENTIFICATION / INCORRECTLY LABELED SPECIMEN   Glucose, UA (A) NEGATIVE mg/dL    QUESTIONABLE IDENTIFICATION / INCORRECTLY LABELED SPECIMEN   Bilirubin Urine (A) NEGATIVE    QUESTIONABLE IDENTIFICATION / INCORRECTLY LABELED SPECIMEN   Ketones, ur (A) NEGATIVE mg/dL    QUESTIONABLE IDENTIFICATION / INCORRECTLY LABELED SPECIMEN   Specific Gravity, Urine  1.005 - 1.030    QUESTIONABLE IDENTIFICATION / INCORRECTLY LABELED SPECIMEN   Hgb urine dipstick (A) NEGATIVE    QUESTIONABLE  IDENTIFICATION / INCORRECTLY LABELED SPECIMEN   pH  5.0 - 8.0    QUESTIONABLE IDENTIFICATION / INCORRECTLY LABELED SPECIMEN   Protein, ur (A) NEGATIVE mg/dL    QUESTIONABLE IDENTIFICATION / INCORRECTLY LABELED SPECIMEN   Nitrite (A) NEGATIVE    QUESTIONABLE IDENTIFICATION / INCORRECTLY LABELED SPECIMEN   Leukocytes, UA (A) NEGATIVE    QUESTIONABLE IDENTIFICATION / INCORRECTLY LABELED SPECIMEN   RBC / HPF  0 - 5 RBC/hpf    QUESTIONABLE IDENTIFICATION / INCORRECTLY LABELED SPECIMEN   WBC, UA  0 - 5 WBC/hpf  QUESTIONABLE IDENTIFICATION / INCORRECTLY LABELED SPECIMEN   Bacteria, UA (A) NONE SEEN    QUESTIONABLE IDENTIFICATION / INCORRECTLY LABELED SPECIMEN   Squamous Epithelial / LPF (A) NONE SEEN    QUESTIONABLE IDENTIFICATION / INCORRECTLY LABELED SPECIMEN   WBC Clumps      QUESTIONABLE IDENTIFICATION / INCORRECTLY LABELED SPECIMEN   Mucous      QUESTIONABLE IDENTIFICATION / INCORRECTLY LABELED SPECIMEN  Troponin I     Status: None   Collection Time: 08/20/15  1:48 AM  Result Value Ref Range   Troponin I <0.03 <0.031 ng/mL  CKMB([facility] only)     Status: None   Collection Time: 08/20/15  1:48 AM  Result Value Ref Range   CK, MB 4.4 0.5 - 5.0 ng/mL  CBC     Status: Abnormal   Collection Time: 08/20/15  8:37 AM  Result Value Ref Range   WBC 6.2 3.8 - 10.6 K/uL   RBC 4.02 (L) 4.40 - 5.90 MIL/uL   Hemoglobin 12.2 (L) 13.0 - 18.0 g/dL   HCT 16.1 (L) 09.6 - 04.5 %   MCV 90.5 80.0 - 100.0 fL   MCH 30.3 26.0 - 34.0 pg   MCHC 33.5 32.0 - 36.0 g/dL   RDW 40.9 81.1 - 91.4 %   Platelets 150 150 - 440 K/uL  Basic metabolic panel     Status: Abnormal   Collection Time: 08/20/15  8:37 AM  Result Value Ref Range   Sodium 135 135 - 145 mmol/L   Potassium 3.3 (L) 3.5 - 5.1 mmol/L   Chloride 101 101 - 111 mmol/L   CO2 27 22 - 32 mmol/L   Glucose, Bld 106 (H) 65 - 99 mg/dL   BUN 13 6 - 20 mg/dL   Creatinine, Ser 7.82 (L) 0.61 - 1.24 mg/dL   Calcium 8.1 (L) 8.9 - 10.3 mg/dL   GFR calc  non Af Amer >60 >60 mL/min   GFR calc Af Amer >60 >60 mL/min   Anion gap 7 5 - 15   Dg Chest 1 View  08/20/2015  CLINICAL DATA:  Cough with fever this morning.  Jaw pain. EXAM: CHEST 1 VIEW COMPARISON:  08/18/2015 FINDINGS: Postoperative changes in the mediastinum. Shallow inspiration with atelectasis in the lung bases. Cardiac enlargement without vascular congestion. Colonic interposition under the right hemidiaphragm. Calcification of the aorta. Degenerative changes in the spine and shoulders. IMPRESSION: Shallow inspiration with atelectasis in the lung bases. Electronically Signed   By: Burman Nieves M.D.   On: 08/20/2015 02:13   Dg Chest 1 View  08/18/2015  CLINICAL DATA:  Lethargy, cough EXAM: CHEST 1 VIEW COMPARISON:  08/16/2015 FINDINGS: Cardiomediastinal silhouette is stable. No acute infiltrate or pleural effusion. No pulmonary edema. Stable chronic elevation of the left hemidiaphragm with left basilar atelectasis. Status post median sternotomy. IMPRESSION: No active disease. Chronic elevation of the left hemidiaphragm with left basilar atelectasis. Electronically Signed   By: Natasha Mead M.D.   On: 08/18/2015 15:53   Mr Laqueta Jean Contrast  08/18/2015  CLINICAL DATA:  Acute encephalopathy. Patient not acting RIGHT, lethargic. EXAM: MRI HEAD WITH CONTRAST TECHNIQUE: Multiplanar, multiecho pulse sequences of the brain and surrounding structures were obtained with intravenous contrast. COMPARISON:  MR brain without contrast 08/17/2015. CONTRAST:  20mL MULTIHANCE GADOBENATE DIMEGLUMINE 529 MG/ML IV SOLN FINDINGS: No abnormal enhancement of the brain or meninges. Major dural venous sinuses are patent. Atrophy and small vessel disease, better evaluated on prior MR. No concerning features when correlated with yesterday's MR.  IMPRESSION: No acute abnormalities are seen on post infusion imaging. Electronically Signed   By: Elsie StainJohn T Curnes M.D.   On: 08/18/2015 15:15     ASSESSMENT AND PLAN: Paroxysmal  atrial fibrillation with symptoms of TIA. Patient is already on Lovenox as anticoagulation because has very high chads score. We will put the patient on Eliquis and by mouth amiodarone. Echocardiogram showed wall motion abnormality suggestive of coronary artery disease and ejection fraction 45%.  Asiah Browder A

## 2015-08-20 NOTE — Consult Note (Signed)
ANTICOAGULATION CONSULT NOTE - Initial Consult  Pharmacy Consult for apixaban Indication: atrial fibrillation  Allergies  Allergen Reactions  . Penicillins Hives    Has patient had a PCN reaction causing immediate rash, facial/tongue/throat swelling, SOB or lightheadedness with hypotension: Yes Has patient had a PCN reaction causing severe rash involving mucus membranes or skin necrosis: Yes Has patient had a PCN reaction that required hospitalization No Has patient had a PCN reaction occurring within the last 10 years: No If all of the above answers are "NO", then may proceed with Cephalosporin use.   . Latex Rash  . Sulfonamide Derivatives Rash    Patient Measurements: Height: 5\' 11"  (180.3 cm) Weight: 207 lb 4 oz (94.008 kg) IBW/kg (Calculated) : 75.3 Heparin Dosing Weight:   Vital Signs: Temp: 98.2 F (36.8 C) (06/18 0514) Temp Source: Oral (06/18 0514) BP: 116/67 mmHg (06/18 0514) Pulse Rate: 69 (06/18 0514)  Labs:  Recent Labs  08/18/15 0508 08/20/15 0148 08/20/15 0837  HGB 12.6*  --  12.2*  HCT 36.8*  --  36.4*  PLT 136*  --  150  CREATININE 0.65  --  0.58*  CKMB  --  4.4  --   TROPONINI  --  <0.03  --     Estimated Creatinine Clearance: 70.4 mL/min (by C-G formula based on Cr of 0.58).   Medical History: Past Medical History  Diagnosis Date  . CAD (coronary artery disease)     nuclear 01/2008, no ischemia, EF 60%, distal anterior scar  . Hyperlipidemia   . Syncope     .remote..no etiology  /  syncope 11/2008.. from acute leg pain  . Venous insufficiency     support hose  . Carotid artery disease (HCC)     doppler 05/2009,  0-39% bilateral / doppler 06/2010 OK  . Elevated CPK     11/23/08.Marland Kitchen.Marland Kitchen.probably from debris from popliteal aneurysm  . Popliteal aneurysm (HCC)     .repair..11/25/2008  Dr. Darrick PennaFields  . Speech problem     Neurologic event with brief infusion and speech difficulty.... January, 2011.... being assessed by neurology  February, 2011  . Hx  of CABG     1998  . Ejection fraction     EF 45%, echo,05/2009,apical hypo,  ?? posterior and lateral hypo  . Prominent abdominal aortic pulsation     doppler normal  . Intracranial hemorrhage (HCC)     Two small subependymal bleeds 06/2010,,ASA held  . Bradycardia     06/2010  . Edema     Ankle edema August, 2012  . TIA (transient ischemic attack)     Brief episode of confusion and speech difficulty, January, 2012  /  recurrent confusion speech difficulty September, 2012  . PAD (peripheral artery disease) (HCC)     Above the knee to below the knee popliteal bypass,, right  //    Doppler,  July, 2013, patent  . Fall at home July 2015    Medications:  Scheduled:  . amiodarone  200 mg Oral Daily  . apixaban  5 mg Oral BID  . aspirin EC  81 mg Oral Daily  . calcium carbonate  2 tablet Oral Daily  . fluticasone  1 spray Each Nare Daily  . nitroGLYCERIN  0.5 inch Topical Q6H    Assessment: Pt is a 82 year old male with new onset Afib. Pharmacy consulted to dose apixaban. Pt has been on lovenox 40 q 24. Last dose 6/17 @ 2130. Scr <1.5, weight >60kg  Goal of  Therapy:   Monitor platelets by anticoagulation protocol: Yes   Plan:  Apixaban  BID starting this evening at 2200. Pharmacy to continue to monitor.  Olene Floss, Pharm.D Clinical Pharmacist   08/20/2015,12:03 PM

## 2015-08-20 NOTE — Progress Notes (Signed)
Patient ID: [name] [name], male   DOB: [birth year], 82 y.o.   MRN: [id number] [employer] PROGRESS NOTE  [name] [name] YQM:[id number] DOB: Aug 15, 1921, 82 DOA: 08/16/2015 PCP: [name], MD  HPI/Subjective: Episode of tachycardia and atrial fibrillation last night and heart rate up to 15 0 bpm as per family. But none  Of it is documented last night  Note.in vital section Hr upto 126 last night. According to patient's daughter [name] [name] ,patient had a very rough night with coughing spells.. Patient is awake, oriented. No fever.   Objective: Filed Vitals:   08/20/15 0108 08/20/15 0514  BP: 127/84 116/67  Pulse: 126 69  Temp: 98.9 F (37.2 C) 98.2 F (36.8 C)  Resp: 16 17    Filed Weights   08/16/15 1914 08/17/15 0300  Weight: 95.255 kg (210 lb) 94.008 kg (207 lb 4 oz)    ROS: Review of Systems  Constitutional: Negative for fever and chills.  Eyes: Negative for blurred vision.  Respiratory: Positive for cough. Negative for shortness of breath.   Cardiovascular: Negative for chest pain.  Gastrointestinal: Negative for nausea, vomiting, abdominal pain, diarrhea and constipation.  Genitourinary: Negative for dysuria.  Musculoskeletal: Negative for joint pain.  Neurological: Negative for dizziness and headaches.   Exam: Physical Exam  HENT:  Nose: No mucosal edema.  Mouth/Throat: No oropharyngeal exudate or posterior oropharyngeal edema.  Eyes: Conjunctivae, EOM and lids are normal. Pupils are equal, round, and reactive to light.  Neck: No JVD present. Carotid bruit is not present. No edema present. No thyroid mass and no thyromegaly present.  Cardiovascular: S1 normal and S2 normal.  Exam reveals no gallop.   Murmur heard.  Systolic murmur is present with a grade of 2/6  Pulses:      Dorsalis pedis pulses are 1+ on the right side, and 1+ on the left side.  Respiratory: No respiratory distress. He has no wheezes. He has no rhonchi. He has no rales.  GI: Soft. Bowel sounds are  normal. There is no tenderness.  Musculoskeletal:       Right ankle: He exhibits swelling.       Left ankle: He exhibits swelling.  Lymphadenopathy:    He has no cervical adenopathy.  Neurological: He is alert.  Left eye still does not go all the way to the left. He states vision is okay  Skin: Skin is warm. No rash noted. Nails show no clubbing.  Psychiatric: His affect is blunt.      Data Reviewed: Basic Metabolic Panel:  Recent Labs Lab 08/16/15 1929 08/17/15 0240 08/18/15 0508 08/20/15 0837  NA 133* 133* 131* 135  K 4.2 4.1 3.6 3.3*  CL 97* 97* 99* 101  CO2 GLUCOSE 101* 108* 111* 106*  BUN CREATININE 0.71 0.67 0.65 0.58*  CALCIUM 9.1 8.9 8.2* 8.1*   CBC:  Recent Labs Lab 08/16/15 1929 08/17/15 0240 08/18/15 0508 08/20/15 0837  WBC 8.4 7.4 7.1 6.2  HGB 13.3 13.2 12.6* 12.2*  HCT 39.5* 38.9* 36.8* 36.4*  MCV 89.7 90.1 89.8 90.5  PLT 163 156 136* 150   BNP (last 3 results)  Recent Labs  08/03/15 1125  BNP 154.0*      Studies: Dg Chest 1 View  08/20/2015  CLINICAL DATA:  Cough with fever this morning.  Jaw pain. EXAM: CHEST 1 VIEW COMPARISON:  08/18/2015 FINDINGS: Postoperative changes in the mediastinum. Shallow inspiration with atelectasis in the lung bases. Cardiac enlargement  without vascular congestion. Colonic interposition under the right hemidiaphragm. Calcification of the aorta. Degenerative changes in the spine and shoulders. IMPRESSION: Shallow inspiration with atelectasis in the lung bases. Electronically Signed   By: Burman Nieves M.D.   On: 08/20/2015 02:13   Dg Chest 1 View  08/18/2015  CLINICAL DATA:  Lethargy, cough EXAM: CHEST 1 VIEW COMPARISON:  08/16/2015 FINDINGS: Cardiomediastinal silhouette is stable. No acute infiltrate or pleural effusion. No pulmonary edema. Stable chronic elevation of the left hemidiaphragm with left basilar atelectasis. Status post median sternotomy. IMPRESSION: No active disease.  Chronic elevation of the left hemidiaphragm with left basilar atelectasis. Electronically Signed   By: Natasha Mead M.D.   On: 08/18/2015 15:53   Mr [name] [name] Contrast  08/18/2015  CLINICAL DATA:  Acute encephalopathy. Patient not acting RIGHT, lethargic. EXAM: MRI HEAD WITH CONTRAST TECHNIQUE: Multiplanar, multiecho pulse sequences of the brain and surrounding structures were obtained with intravenous contrast. COMPARISON:  MR brain without contrast 08/17/2015. CONTRAST:  20mL MULTIHANCE GADOBENATE DIMEGLUMINE 529 MG/ML IV SOLN FINDINGS: No abnormal enhancement of the brain or meninges. Major dural venous sinuses are patent. Atrophy and small vessel disease, better evaluated on prior MR. No concerning features when correlated with yesterday's MR. IMPRESSION: No acute abnormalities are seen on post infusion imaging. Electronically Signed   By: Elsie Stain M.D.   On: 08/18/2015 15:15    Scheduled Meds: . aspirin EC  81 mg Oral Daily  . calcium carbonate  2 tablet Oral Daily  . enoxaparin (LOVENOX) injection  40 mg Subcutaneous Q24H  . fluticasone  1 spray Each Nare Daily  . nitroGLYCERIN  0.5 inch Topical Q6H   Continuous Infusions:    Assessment/Plan:  Acute encephalopathy. Unclear cause at this time. Family does not want lumbar puncture. Patient mental status improved. Physical therapy recommended SNF placement. All the workup has been negative so far including MRI of the brain, ultrasound carotids, EKG, echocardiogram, EEG.   Lower extremity edema. Family has been hesitant before about diuretic.  4.   History of CAD on aspirin.Because of atrial fibrillation last night, family requesting cardiology consult. Patient has  [place] cardi[facility] before. Consult [facility] cardiology. Get EKG.  . 5.   Deconditioning. PT rec rehab and family is interested  #6. Cough,  left-sided pneumonia: Continue incentive spirometry ,because of low-grade fever started empiric Levaquin.daughter [name] [name] is concerned  about Levaquin and also tachycardia. She wanted to see if we can discontinue the Levaquin and the see if the heart rate improves. Told her cough is probably the cause for his her tachycardia rather than the Levaquin. #7.Hyponatremia;improved. Now has hypokalemia, replace the potassium. #8.poor vision in the left eye likely early-onset  PRESS. Reassess.neurology is following  Discussed patient's daughter  At bedside, spent more than 20 minutes explaining the findings and also workup.  More than 50% of time spent in counseling and coordination of  Services. Code Status:     Code Status Orders        Start     Ordered   08/17/15 0113  Full code   Continuous     08/17/15 0114    Code Status History    Date Active Date Inactive Code Status Order ID Comments User Context   08/17/2015  1:14 AM  Full Code [id number]  Joella Prince, MD ED    Advance Directive Documentation        Most Recent Value   Type of Advance Directive  Healthcare Power of [place]  Pre-existing out of facility DNR order (yellow form or pink MOST form)     "MOST" Form in Place?       Family Communication: Spoke with Family at the bedside Disposition Plan: Likely to rehab.  Case discussed with Neurology and opthalmology  Consultants:  Neurology  Opthalmology  Time spent: 35 minutes  [facility] CenterKONIDENA,Beauty Pless  [employer]

## 2015-08-21 ENCOUNTER — Inpatient Hospital Stay: Payer: Medicare Other

## 2015-08-21 MED ORDER — GUAIFENESIN ER 600 MG PO TB12
600.0000 mg | ORAL_TABLET | Freq: Two times a day (BID) | ORAL | Status: DC
  Administered 2015-08-21 – 2015-08-22 (×3): 600 mg via ORAL
  Filled 2015-08-21 (×3): qty 1

## 2015-08-21 MED ORDER — CEFUROXIME AXETIL 500 MG PO TABS
500.0000 mg | ORAL_TABLET | Freq: Two times a day (BID) | ORAL | Status: DC
  Administered 2015-08-21 – 2015-08-22 (×2): 500 mg via ORAL
  Filled 2015-08-21 (×2): qty 1

## 2015-08-21 NOTE — Progress Notes (Signed)
SUBJECTIVE: Patient is feeling much better denies any chest pain or shortness of breath   Filed Vitals:   08/20/15 2357 08/21/15 0456 08/21/15 0457 08/21/15 0854  BP: 140/67 148/76  130/72  Pulse: 84 88 86 74  Temp: 98.4 F (36.9 C) 98.1 F (36.7 C)  98.4 F (36.9 C)  TempSrc: Oral Oral  Oral  Resp: 17 17  18   Height:      Weight:      SpO2: 93% 90% 92% 94%    Intake/Output Summary (Last 24 hours) at 08/21/15 1155 Last data filed at 08/21/15 0900  Gross per 24 hour  Intake      0 ml  Output      0 ml  Net      0 ml    LABS: Basic Metabolic Panel:  Recent Labs  16/12/9604/18/17 0837  NA 135  K 3.3*  CL 101  CO2 27  GLUCOSE 106*  BUN 13  CREATININE 0.58*  CALCIUM 8.1*   Liver Function Tests: No results for input(s): AST, ALT, ALKPHOS, BILITOT, PROT, ALBUMIN in the last 72 hours. No results for input(s): LIPASE, AMYLASE in the last 72 hours. CBC:  Recent Labs  08/20/15 0837  WBC 6.2  HGB 12.2*  HCT 36.4*  MCV 90.5  PLT 150   Cardiac Enzymes:  Recent Labs  08/20/15 0148  CKMB 4.4  TROPONINI <0.03   BNP: Invalid input(s): POCBNP D-Dimer: No results for input(s): DDIMER in the last 72 hours. Hemoglobin A1C: No results for input(s): HGBA1C in the last 72 hours. Fasting Lipid Panel: No results for input(s): CHOL, HDL, LDLCALC, TRIG, CHOLHDL, LDLDIRECT in the last 72 hours. Thyroid Function Tests: No results for input(s): TSH, T4TOTAL, T3FREE, THYROIDAB in the last 72 hours.  Invalid input(s): FREET3 Anemia Panel: No results for input(s): VITAMINB12, FOLATE, FERRITIN, TIBC, IRON, RETICCTPCT in the last 72 hours.   PHYSICAL EXAM General: Well developed, well nourished, in no acute distress HEENT:  Normocephalic and atramatic Neck:  No JVD.  Lungs: Clear bilaterally to auscultation and percussion. Heart: HRRR . Normal S1 and S2 without gallops or murmurs.  Abdomen: Bowel sounds are positive, abdomen soft and non-tender  Msk:  Back normal, normal  gait. Normal strength and tone for age. Extremities: No clubbing, cyanosis or edema.   Neuro: Alert and oriented X 3. Psych:  Good affect, responds appropriately  TELEMETRY:Remained in sinus rhythm about 78 bpm last night no episodes of atrial fibrillation.  ASSESSMENT AND PLAN: Paroxysmal atrial fibrillation with transient ischemic attacks. Started the patient on Eliquis yesterday along with amiodarone 200 mg by mouth daily. Patient can be discharged with follow-up in the office next week.  Active Problems:   TIA (transient ischemic attack)   Altered mental status    [name] [name] A, MD, [facility] 08/21/2015 11:55 AM

## 2015-08-21 NOTE — Progress Notes (Signed)
Occupational Therapy Treatment Patient Details Name: [name] [name] MRN: [id number] DOB: 08/13/1921 Today's Date: 08/21/2015    History of present illness 82 y.o. male past medical history of hyperlipidemia, coronary artery disease status post CABG, prior history of TIA and peripheral artery disease only on a baby aspirin. Came to hospital because he was complaining of some trouble with eye tracking tonight he was unable to look towards the left. Pt with some stroke-like symptoms, CT and MRI negative.   OT comments  Patient seen this date for OT tx session with focus on left sided visual attention during functional tasks and ADLs, requires moderate cues for left visual field to attend.  Reading is difficult and requires compensatory strategies to read from left to right, attend to edge of paper and using finger to follow text.  Able to complete basic grooming tasks with set up and minimal cues.  Family reports he is feeding himself but missing items on the left side.  Education to patient and family regarding room set up, tray set up and cues to provide patient to encourage left sided attention.  Patient continues to benefit from skilled OT.   Follow Up Recommendations  SNF    Equipment Recommendations       Recommendations for Other Services      Precautions / Restrictions Precautions Precautions: Fall Restrictions Weight Bearing Restrictions: No       Mobility Bed Mobility Overal bed mobility: Needs Assistance;+2 for physical assistance             General bed mobility comments: assist to reposition in bed for comfort and pressure relief  Transfers                      Balance                                   ADL Overall ADL's : Needs assistance/impaired Eating/Feeding: Set up;Cueing for compensatory techinques Eating/Feeding Details (indicate cue type and reason): Patient missing items on the left side of plate, cues for scanning to left.   Encouraged family to provide cues rather than attempt to move items into visual field.  Grooming: Wash/dry hands;Wash/dry face;Set up;Bed level Grooming Details (indicate cue type and reason): Patient able to cross midline to wash face and hands and using bilateral UE to remove glasses.  Cues for items placed towards left side.                                       Vision                 Additional Comments: Patient seen for activities to challenge left peripheral vision such as searching around the room for objects of a certain color, reading from cards.  Requires cues both verbal and tactile for compensatory strategies for reading and attending to left visual field.    Perception     Praxis      Cognition   Behavior During Therapy: WFL for tasks assessed/performed Overall Cognitive Status: History of cognitive impairments - at baseline                       Extremity/Trunk Assessment               Exercises Other Exercises Other  Exercises: Patient seen for bilateral UE ROM/strengthening towel exercises for shoulder flexion, chest press for 10 reps for 1 set each, cues both verbal and tactile.    Shoulder Instructions       General Comments      Pertinent Vitals/ Pain       Pain Assessment: No/denies pain  Home Living                                          Prior Functioning/Environment              Frequency Min 1X/week     Progress Toward Goals  OT Goals(current goals can now be found in the care plan section)  Progress towards OT goals: Progressing toward goals  Acute Rehab OT Goals Patient Stated Goal: go home  Plan Discharge plan remains appropriate    Co-evaluation                 End of Session     Activity Tolerance Patient tolerated treatment well   Patient Left in bed;with call bell/phone within reach;with bed alarm set;with family/visitor present   Nurse Communication           Time: 1610-9604 OT Time Calculation (min): 34 min  Charges: OT General Charges $OT Visit: 1 Procedure OT Treatments $Self Care/Home Management : 8-22 mins $Therapeutic Exercise: 8-22 mins  Nghia Mcentee  Alyssha Housh T Leather Estis, OTR/L, CLT  08/21/2015, 4:27 PM

## 2015-08-21 NOTE — Care Management Important Message (Signed)
Important Message  Patient Details  Name: [name] [name] MRN: [id number] Date of Birth: 12/06/1921   Medicare Important Message Given:  Yes    [name], RN 08/21/2015, 11:15 AM

## 2015-08-21 NOTE — Progress Notes (Signed)
Physical Therapy Treatment Patient Details Name: [name] [name] MRN: [id number] DOB: 1922-02-24 Today's Date: 08/21/2015    History of Present Illness 82 y.o. male past medical history of hyperlipidemia, coronary artery disease status post CABG, prior history of TIA and peripheral artery disease only on a baby aspirin. Came to hospital because he was complaining of some trouble with eye tracking tonight he was unable to look towards the left. Pt with some stroke-like symptoms, CT and MRI negative.    PT Comments    Pt in bed ready to get up this morning.  Pt requires mod a +2 for all mobility skills today.  Was able to stand on first attempt but unable to move feet.  On second attempt he was able to transfer to chair with slow steady steps.  Posture remained somewhat stooped but overall did well.  Discussed transfer status with primary CNA.  Pt requires +2 assist and may need lift equipment if fatigued from sitting this morning.  Participated in exercises as described below.    Follow Up Recommendations  SNF     Equipment Recommendations       Recommendations for Other Services       Precautions / Restrictions Precautions Precautions: Fall Restrictions Weight Bearing Restrictions: No    Mobility  Bed Mobility Overal bed mobility: Needs Assistance;+2 for physical assistance Bed Mobility: Supine to Sit     Supine to sit: Mod assist;+2 for physical assistance        Transfers Overall transfer level: Needs assistance Equipment used: Rolling walker (2 wheeled) Transfers: Sit to/from Stand Sit to Stand: Mod assist;+2 physical assistance         General transfer comment: able to stand today but remains in crounched position.  able to tranfer to chair at bedsidw with slow movements  Ambulation/Gait Ambulation/Gait assistance: +2 physical assistance;Mod assist Ambulation Distance (Feet): 2 Feet Assistive device: Rolling walker (2 wheeled) Gait Pattern/deviations: Step-to  pattern;Shuffle;Decreased step length - right;Decreased step length - left   Gait velocity interpretation: <1.8 ft/sec, indicative of risk for recurrent falls General Gait Details: decreased step length and height for stepping.  moderate verbal cues to move feet and stand fully.   Stairs            Wheelchair Mobility    Modified Rankin (Stroke Patients Only)       Balance Overall balance assessment: Needs assistance Sitting-balance support: Feet supported;Bilateral upper extremity supported Sitting balance-Leahy Scale: Poor Sitting balance - Comments: able to maintain sitting today without leaning.   Standing balance support: Bilateral upper extremity supported Standing balance-Leahy Scale: Poor                      Cognition Arousal/Alertness: Awake/alert Behavior During Therapy: WFL for tasks assessed/performed Overall Cognitive Status: History of cognitive impairments - at baseline                      Exercises General Exercises - Lower Extremity Ankle Circles/Pumps: Strengthening;10 reps;Both Long Arc Quad: AROM;Both;10 reps;Seated Hip ABduction/ADduction: 10 reps;Both;AROM Hip Flexion/Marching: AROM;Both;10 reps;Seated    General Comments        Pertinent Vitals/Pain Pain Assessment: No/denies pain    Home Living                      Prior Function            PT Goals (current goals can now be found in the care plan  section) Progress towards PT goals: Progressing toward goals    Frequency  Min 2X/week    PT Plan Current plan remains appropriate    Co-evaluation             End of Session Equipment Utilized During Treatment: Gait belt Activity Tolerance: Patient tolerated treatment well Patient left: in chair;with chair alarm set;with call bell/phone within reach;with family/visitor present     Time: [id number] PT Time Calculation (min) (ACUTE ONLY): 24 min  Charges:  $Therapeutic Exercise: 8-22  mins $Therapeutic Activity: 8-22 mins                    G Codes:      Danielle DessSarah Johnathyn Viscomi, PTA 08/21/2015, 11:00 AM

## 2015-08-21 NOTE — Progress Notes (Signed)
Checked to see if pre-authorization would be needed for non-emergent EMS transport. Per [employer] benefits obtained online through [employer], patient has a [employer] Medicare Advantage PPO policy.  Medicare PPO plans do not require pre-auth for non-emergent ground transports using service codes A0426 or A0428.   

## 2015-08-21 NOTE — Progress Notes (Signed)
Patient ID: [name] [name], male   DOB: 09/06/1921, 82 y.o.   MRN: [id number] [facility] PROGRESS NOTE  [name] [name] [id number] DOB: 07/05/1921 DOA: 08/16/2015 PCP: Vonita MossMark Crissman, MD  HPI/Subjective: Patient seems to be doing better, he's sitting in the chair eating. His family is at bedside. They're concerned with this yellow productive cough. Currently not coughing   Objective: Filed Vitals:   08/21/15 0457 08/21/15 0854  BP:  130/72  Pulse: 86 74  Temp:  98.4 F (36.9 C)  Resp:  18    Filed Weights   08/16/15 1914 08/17/15 0300  Weight: 95.255 kg (210 lb) 94.008 kg (207 lb 4 oz)    ROS: Review of Systems  Constitutional: Negative for fever and chills.  Eyes: Negative for blurred vision.  Respiratory: Positive for cough. Negative for shortness of breath.   Cardiovascular: Negative for chest pain.  Gastrointestinal: Negative for nausea, vomiting, abdominal pain, diarrhea and constipation.  Genitourinary: Negative for dysuria.  Musculoskeletal: Negative for joint pain.  Neurological: Negative for dizziness and headaches.   Exam: Physical Exam  HENT:  Nose: No mucosal edema.  Mouth/Throat: No oropharyngeal exudate or posterior oropharyngeal edema.  Eyes: Conjunctivae, EOM and lids are normal. Pupils are equal, round, and reactive to light.  Neck: No JVD present. Carotid bruit is not present. No edema present. No thyroid mass and no thyromegaly present.  Cardiovascular: S1 normal and S2 normal.  Exam reveals no gallop.   Murmur heard.  Systolic murmur is present with a grade of 2/6  Pulses:      Dorsalis pedis pulses are 1+ on the right side, and 1+ on the left side.  Respiratory: No respiratory distress. He has no wheezes. He has no rhonchi. He has no rales.  GI: Soft. Bowel sounds are normal. There is no tenderness.  Musculoskeletal:       Right ankle: He exhibits swelling.       Left ankle: He exhibits swelling.  Lymphadenopathy:    He has no cervical  adenopathy.  Neurological: He is alert.  Left eye still does not go all the way to the left. He states vision is okay  Skin: Skin is warm. No rash noted. Nails show no clubbing.  Psychiatric: His affect is blunt.      Data Reviewed: Basic Metabolic Panel:  Recent Labs Lab 08/16/15 1929 08/17/15 0240 08/18/15 0508 08/20/15 0837  NA 133* 133* 131* 135  K 4.2 4.1 3.6 3.3*  CL 97* 97* 99* 101  CO2 25 27 24 27   GLUCOSE 101* 108* 111* 106*  BUN 16 14 11 13   CREATININE 0.71 0.67 0.65 0.58*  CALCIUM 9.1 8.9 8.2* 8.1*   CBC:  Recent Labs Lab 08/16/15 1929 08/17/15 0240 08/18/15 0508 08/20/15 0837  WBC 8.4 7.4 7.1 6.2  HGB 13.3 13.2 12.6* 12.2*  HCT 39.5* 38.9* 36.8* 36.4*  MCV 89.7 90.1 89.8 90.5  PLT 163 156 136* 150   BNP (last 3 results)  Recent Labs  08/03/15 1125  BNP 154.0*      Studies: Dg Chest 1 View  08/20/2015  CLINICAL DATA:  Cough with fever this morning.  Jaw pain. EXAM: CHEST 1 VIEW COMPARISON:  08/18/2015 FINDINGS: Postoperative changes in the mediastinum. Shallow inspiration with atelectasis in the lung bases. Cardiac enlargement without vascular congestion. Colonic interposition under the right hemidiaphragm. Calcification of the aorta. Degenerative changes in the spine and shoulders. IMPRESSION: Shallow inspiration with atelectasis in the lung bases. Electronically Signed  By: Burman Nieves M.D.   On: 08/20/2015 02:13    Scheduled Meds: . amiodarone  200 mg Oral Daily  . apixaban  5 mg Oral BID  . aspirin EC  81 mg Oral Daily  . calcium carbonate  2 tablet Oral Daily  . cefUROXime  500 mg Oral BID WC  . fluticasone  1 spray Each Nare Daily  . guaiFENesin  600 mg Oral BID   Continuous Infusions:    Assessment/Plan:  Acute encephalopathy. Unclear cause at this time. Family does not want lumbar puncture. Patient mental status improved. Physical therapy recommended SNF placement. All the workup has been negative so far including MRI of  the brain, ultrasound carotids, EKG, echocardiogram, EEG.   Lower extremity edema. Family has been hesitant before about diuretic.   1. Acute encephalopathy now resolved Workup negative  2. Persistent cough with productive sputum I will restart Ceftin Check PA and lateral chest x-ray     3. Visual deficits no evidence of CVA  4. Next is my atrial fibrillation currently on amiodarone and eloquent's no further arrhythmia  5.   Deconditioning. PT rec rehab and family is interested Discussed patient's daughter  At bedside, explained them that I would check a chest x-ray but he will be discharged in the morning   More than 50% of time spent in counseling and coordination of  Services. Code Status:     Code Status Orders        Start     Ordered   08/17/15 0113  Full code   Continuous     08/17/15 0114    Code Status History    Date Active Date Inactive Code Status Order ID Comments User Context   08/17/2015  1:14 AM  Full Code [id number]  Joella Prince, MD ED    Advance Directive Documentation        Most Recent Value   Type of Advance Directive  Healthcare Power of Attorney   Pre-existing out of facility DNR order (yellow form or pink MOST form)     "MOST" Form in Place?       Family Communication: Spoke with Family at the bedside Disposition Plan: Likely to rehab.  Case discussed with Neurology and opthalmology  Consultants:  Neurology  Opthalmology  Time spent:  Jasmen Emrich, [facility]  [employer] [facility]

## 2015-08-21 NOTE — Plan of Care (Signed)
Problem: Education: Goal: Knowledge of disease or condition will improve Outcome: Progressing Family at bedside 24/7. Able to speak with md and receive info from nurse and md  Problem: Self-Care: Goal: Ability to participate in self-care as condition permits will improve Outcome: Progressing Bathe given this am.  Can assist some wit adls  Problem: Nutrition: Goal: Risk of aspiration will decrease Outcome: Progressing Sitting upright when eating . Able to feed self  With assist and encouragement. tol well  Problem: Safety: Goal: Ability to remain free from injury will improve Outcome: Progressing Room at nurses  Desk , 24/7 supervision   With pts dtrs. monitering from staff per protocol alarm on  Problem: Skin Integrity: Goal: Risk for impaired skin integrity will decrease Outcome: Progressing Pink foam reapplied to sacrum after this am bath   Problem: Activity: Goal: Risk for activity intolerance will decrease Outcome: Not Progressing bedrest

## 2015-08-21 NOTE — Progress Notes (Signed)
Subjective: Patient with improved vision from initial presentation but not yet back to baseline.    Objective: Current vital signs: BP 130/72 mmHg  Pulse 74  Temp(Src) 98.4 F (36.9 C) (Oral)  Resp 18  Ht _0  (1.803 m)  Wt 94.008 kg (207 lb 4 oz)  BMI 28.92 kg/m2  SpO2 94% Vital signs in last 24 hours: Temp:  [97.7 F (36.5 C)-98.4 F (36.9 C)] 98.4 F (36.9 C) (06/19 0854) Pulse Rate:  [74-88] 74 (06/19 0854) Resp:  [16-18] 18 (06/19 0854) BP: (130-148)/(59-76) 130/72 mmHg (06/19 0854) SpO2:  [90 %-97 %] 94 % (06/19 0854)  Intake/Output from previous day:   Intake/Output this shift:   Nutritional status: DIET DYS 3 Room service appropriate?: Yes with Assist; Fluid consistency:: Thin  Neurologic Exam: Mental Status: Alert. Speech fluent without evidence of aphasia. Able to follow commands without difficulty.  Cranial Nerves: II: Discs flat bilaterally; Able to count fingers on forward gaze. With right eye able to count fingers in all visual fields. With left eye continues to have visual deficits in the temporal visual field. Pupils equal, round, reactive to light and accommodation III,IV, VI: ptosis not present, extra-ocular motions intact bilaterally   V,VII: mild left facial droop, facial light touch sensation normal bilaterally VIII: hearing normal bilaterally IX,X: gag reflex present XI: bilateral shoulder shrug XII: midline tongue extension Motor: Right :Upper extremity 5/5Left: Upper extremity 5/5   Lab Results: Basic Metabolic Panel:  Recent Labs Lab 08/16/15 1929 08/17/15 0240 08/18/15 0508 08/20/15 0837  NA 133* 133* 131* 135  K 4.2 4.1 3.6 3.3*  CL 97* 97* 99* 101  CO2 _1 GLUCOSE 101* 108* 111* 106*  BUN _2 CREATININE 0.71 0.67 0.65 0.58*  CALCIUM 9.1 8.9 8.2* 8.1*    Liver Function Tests: No results for input(s): AST, ALT, ALKPHOS, BILITOT, PROT, ALBUMIN in the  last 168 hours. No results for input(s): LIPASE, AMYLASE in the last 168 hours. No results for input(s): AMMONIA in the last 168 hours.  CBC:  Recent Labs Lab 08/16/15 1929 08/17/15 0240 08/18/15 0508 08/20/15 0837  WBC 8.4 7.4 7.1 6.2  HGB 13.3 13.2 12.6* 12.2*  HCT 39.5* 38.9* 36.8* 36.4*  MCV 89.7 90.1 89.8 90.5  PLT 163 156 136* 150    Cardiac Enzymes:  Recent Labs Lab 08/20/15 0148  CKMB 4.4  TROPONINI <0.03    Lipid Panel:  Recent Labs Lab 08/17/15 0240  CHOL 138  TRIG 52  HDL 41  CHOLHDL 3.4  VLDL 10  LDLCALC 87    CBG: No results for input(s): GLUCAP in the last 168 hours.  Microbiology: Results for orders placed or performed during the hospital encounter of 06/26/10  Culture, blood (routine x 2)     Status: None   Collection Time: 06/26/10  7:01 PM  Result Value Ref Range Status   Specimen Description BLOOD ARM LEFT  Final   Special Requests BOTTLES DRAWN AEROBIC ONLY [facility]  Final   Culture  Setup Time [id number]  Final   Culture NO GROWTH 5 DAYS  Final   Report Status 07/03/2010 FINAL  Final  Culture, blood (routine x 2)     Status: None   Collection Time: 06/26/10  7:08 PM  Result Value Ref Range Status   Specimen Description BLOOD ARM RIGHT  Final   Special Requests BOTTLES DRAWN AEROBIC ONLY [facility]  Final   Culture  Setup Time [id number]  Final  Culture NO GROWTH 5 DAYS  Final   Report Status 07/03/2010 FINAL  Final  Urine culture     Status: None   Collection Time: 06/26/10  9:44 PM  Result Value Ref Range Status   Specimen Description URINE, CATHETERIZED  Final   Special Requests NONE  Final   Culture  Setup Time [id number]  Final   Colony Count NO GROWTH  Final   Culture NO GROWTH  Final   Report Status 06/27/2010 FINAL  Final    Coagulation Studies: No results for input(s): LABPROT, INR in the last 72 hours.  Imaging: Dg Chest 1 View  08/20/2015  CLINICAL DATA:  Cough with fever this morning.  Jaw pain. EXAM: CHEST 1  VIEW COMPARISON:  08/18/2015 FINDINGS: Postoperative changes in the mediastinum. Shallow inspiration with atelectasis in the lung bases. Cardiac enlargement without vascular congestion. Colonic interposition under the right hemidiaphragm. Calcification of the aorta. Degenerative changes in the spine and shoulders. IMPRESSION: Shallow inspiration with atelectasis in the lung bases. Electronically Signed   By: Lucienne Capers M.D.   On: 08/20/2015 02:13   Dg Chest 2 View  08/21/2015  CLINICAL DATA:  82 year old male with possible stroke. Atrial fibrillation. Initial encounter. EXAM: CHEST  2 VIEW COMPARISON:  08/20/2015 and earlier. FINDINGS: Seated AP and lateral views of the chest. Continued moderate elevation of the left hemidiaphragm with abundant subjacent bowel gas. Bowel gas in the right abdomen has regressed since 08/16/2015. Stable cardiomegaly and mediastinal contours. Sequelae of CABG. No pneumothorax, consolidation, or definite pleural effusion. Pulmonary vascularity is increased. Stable visualized osseous structures. Calcified aortic atherosclerosis. IMPRESSION: Increased pulmonary vascularity since 08/16/2015, consider mild or developing interstitial edema. No definite pleural effusion or other new cardiopulmonary abnormality. Electronically Signed   By: Genevie Ann M.D.   On: 08/21/2015 13:56    Medications:  I have reviewed the patient's current medications. Scheduled: . amiodarone  200 mg Oral Daily  . apixaban  5 mg Oral BID  . aspirin EC  81 mg Oral Daily  . calcium carbonate  2 tablet Oral Daily  . cefUROXime  500 mg Oral BID WC  . fluticasone  1 spray Each Nare Daily  . guaiFENesin  600 mg Oral BID    Assessment/Plan: Unclear etiology for patient's symptoms.  TSH, B12, RPR and ESR unremarkable.  Repeat MRI of the brain personally reviewed and shows no etiology for patient's symptoms.  CRP elevated therefore can not rule out an autoimmune cause but with patient improving would not  consider high dose steroids in patient of this age due to likely complications.    Recommendations: 1.  Repeat CRP 2.  Patient to follow up with ophthalmology at discharge    LOS: 3 days   Alexis Goodell, MD Neurology [phone] 08/21/2015  2:21 PM

## 2015-08-22 ENCOUNTER — Encounter
Admission: RE | Admit: 2015-08-22 | Discharge: 2015-08-22 | Disposition: A | Discharge status: Home / Self Care | Payer: Medicare Other | Source: Ambulatory Visit | Attending: Internal Medicine | Admitting: Internal Medicine

## 2015-08-22 ENCOUNTER — Ambulatory Visit: Payer: Medicare Other

## 2015-08-22 LAB — POTASSIUM: POTASSIUM: 3.7 mmol/L (ref 3.5–5.1)

## 2015-08-22 LAB — C-REACTIVE PROTEIN: CRP: 4.2 mg/dL — AB (ref <1.0)

## 2015-08-22 MED ORDER — GUAIFENESIN 100 MG/5ML PO SOLN: 5.0000 mL | Freq: Two times a day (BID) | ORAL | Status: DC | PRN

## 2015-08-22 MED ORDER — FLUTICASONE PROPIONATE 50 MCG/ACT NA SUSP: 1.0000 | Freq: Every day | NASAL | Status: AC

## 2015-08-22 MED ORDER — BENZONATATE 100 MG PO CAPS: 100.0000 mg | ORAL_CAPSULE | Freq: Three times a day (TID) | ORAL | Status: DC | PRN

## 2015-08-22 MED ORDER — GUAIFENESIN ER 600 MG PO TB12: 600.0000 mg | ORAL_TABLET | Freq: Two times a day (BID) | ORAL | Status: DC

## 2015-08-22 MED ORDER — FUROSEMIDE 10 MG/ML IJ SOLN
20.0000 mg | Freq: Once | INTRAMUSCULAR | Status: AC
  Administered 2015-08-22: 20 mg via INTRAVENOUS
  Filled 2015-08-22: qty 2

## 2015-08-22 MED ORDER — AMIODARONE HCL 200 MG PO TABS: 200.0000 mg | ORAL_TABLET | Freq: Every day | ORAL | Status: DC

## 2015-08-22 MED ORDER — APIXABAN 5 MG PO TABS: 5.0000 mg | ORAL_TABLET | Freq: Two times a day (BID) | ORAL | Status: DC

## 2015-08-22 MED ORDER — CEFUROXIME AXETIL 500 MG PO TABS: 500.0000 mg | ORAL_TABLET | Freq: Two times a day (BID) | ORAL | Status: DC

## 2015-08-22 MED ORDER — ACETAMINOPHEN 325 MG PO TABS: 650.0000 mg | ORAL_TABLET | Freq: Four times a day (QID) | ORAL | Status: DC | PRN

## 2015-08-22 NOTE — Discharge Summary (Signed)
[name] [name], 82 y.o., DOB May 11, 1921, MRN [id number]. 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 male past medical history significant for hyperlipidemia, coronary artery disease status post CABG, prior history of TIA and peripheral artery disease only on aspirin  presented to [facility] because patient was complaining of some trouble with eye tracking tonight he was able to look towards the left. Family thought that the patient was very lethargic, he had drooping eyelids. He had some strokelike symptoms. Patient was admitted and seen by neurology underwent MRI of the brain which was negative for stroke. Patient was also seen by ophthalmology they did not feel that there was any ophthalmologic or abnormality. Patient also during hospitalization developed paroxysmal atrial fibrillation with rapid ventricular rate he was seen by cardiology and started on amiodarone and I will request. Patient continued to have complaint of cough. Had multiple chest x-rays which failed to show pneumonia was seen by speech there is no evidence of aspiration is continued on therapy for acute bronchitis.            Consults  cardiology, neurology  Significant Tests:  See full reports for all details      Dg Chest 1 View  08/20/2015  CLINICAL DATA:  Cough with fever this morning.  Jaw pain. EXAM: CHEST 1 VIEW COMPARISON:  08/18/2015 FINDINGS: Postoperative changes in the mediastinum. Shallow inspiration with  atelectasis in the lung bases. Cardiac enlargement without vascular congestion. Colonic interposition under the right hemidiaphragm. Calcification of the aorta. Degenerative changes in the spine and shoulders. IMPRESSION: Shallow inspiration with atelectasis in the lung bases. Electronically Signed   By: Burman Nieves M.D.   On: 08/20/2015 02:13   Dg Chest 1 View  08/18/2015  CLINICAL DATA:  Lethargy, cough EXAM: CHEST 1 VIEW COMPARISON:  08/16/2015 FINDINGS: Cardiomediastinal silhouette is stable. No acute infiltrate or pleural effusion. No pulmonary edema. Stable chronic elevation of the left hemidiaphragm with left basilar atelectasis. Status post median sternotomy. IMPRESSION: No active disease. Chronic elevation of the left hemidiaphragm with left basilar atelectasis. Electronically Signed   By: Natasha Mead M.D.   On: 08/18/2015 15:53   Dg Chest 2 View  08/21/2015  CLINICAL DATA:  82 year old male with possible stroke. Atrial fibrillation. Initial encounter. EXAM: CHEST  2 VIEW COMPARISON:  08/20/2015 and earlier. FINDINGS: Seated AP and lateral views of the chest. Continued moderate elevation of the left hemidiaphragm with abundant subjacent bowel gas. Bowel gas in the right abdomen has regressed since 08/16/2015. Stable cardiomegaly and mediastinal contours. Sequelae of CABG. No pneumothorax, consolidation, or definite pleural effusion. Pulmonary vascularity is increased. Stable visualized osseous structures. Calcified aortic atherosclerosis. IMPRESSION: Increased pulmonary vascularity since 08/16/2015, consider mild or developing interstitial edema. No definite pleural effusion or other new cardiopulmonary abnormality. Electronically Signed   By: Odessa Fleming M.D.   On: 08/21/2015 13:56   Dg Chest 2 View  08/16/2015  CLINICAL DATA:  Cough EXAM: CHEST  2 VIEW COMPARISON:  08/03/2015 FINDINGS: Cardiomediastinal silhouette is stable. Again noted moderate gaseous distension of the stomach. Status post  CABG.  Osteopenia and mild degenerative change thoracic spine. No infiltrate or pulmonary edema. IMPRESSION: No active disease. Again noted moderate gaseous distension of the stomach. Electronically Signed   By: Natasha Mead M.D.   On: 08/16/2015 20:14   Dg Chest 2 View  08/03/2015  CLINICAL DATA:  Cough for 3 days EXAM: CHEST  2 VIEW COMPARISON:  May 12, 2013 FINDINGS: There is no edema or consolidation. Heart size is upper normal with pulmonary vascularity within normal limits. No adenopathy. Patient is status post coronary artery bypass grafting. 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 Stomach appears somewhat distended with fluid and air. There is degenerative change in both shoulders. IMPRESSION: Stomach distended with fluid and air.  No edema or consolidation. Electronically Signed   By: Bretta Bang III M.D.   On: 08/03/2015 12:17   Ct Head Wo Contrast  08/16/2015  CLINICAL DATA:  Sudden blindness in the left eye. EXAM: CT HEAD WITHOUT CONTRAST TECHNIQUE: Contiguous axial images were obtained from the base of the skull through the vertex without intravenous contrast. COMPARISON:  08/03/2015 FINDINGS: Stable ventriculomegaly. There is stable low-density in the periventricular white matter. Evidence for mild cerebral atrophy. No evidence for acute hemorrhage, mass lesion, midline shift or large infarct. Mild mucosal disease in the right ethmoid air cells. Otherwise, the visualized paranasal sinuses are clear. Mastoid air cells are clear. No calvarial fracture. IMPRESSION: No acute intracranial abnormality. Stable ventriculomegaly. Stable mild atrophy and evidence for chronic small vessel ischemic changes. Electronically Signed   By: Richarda Overlie M.D.   On: 08/16/2015 20:08   Ct Head Wo Contrast  08/03/2015  CLINICAL DATA:  Unusual weakness. EXAM: CT HEAD WITHOUT CONTRAST TECHNIQUE: Contiguous axial images were obtained from the base of the skull through the vertex  without intravenous contrast. COMPARISON:  02/12/2011 FINDINGS: Brain: No evidence of acute infarction, hemorrhage, extra-axial collection, or mass effect. There is stable ventriculomegaly. There is atrophy and chronic small vessel disease changes. Vascular: Vascular calcifications are noted at the skullbase. Skull: Negative for fracture or focal lesion. Sinuses/Orbits: No acute findings. Other: None. IMPRESSION: Stable ventriculomegaly, somewhat out of proportion to the degree of brain parenchymal atrophy. No acute intracranial abnormality. Chronic microvascular disease. Electronically Signed   By: Ted Mcalpine M.D.   On: 08/03/2015 12:50   Mr Brain Wo Contrast  08/17/2015  CLINICAL DATA:  82 year old male with stroke like symptoms, visual problems looking to the left. Initial encounter. EXAM: MRI HEAD WITHOUT CONTRAST MRA HEAD WITHOUT CONTRAST TECHNIQUE: Multiplanar, multiecho pulse sequences of the brain and surrounding structures were obtained without intravenous contrast. Angiographic images of the head were obtained using MRA technique without contrast. COMPARISON:  Head CT without contrast 08/16/2015. [facility] Brain MRI and intracranial MRA 06/27/2010. FINDINGS: MRI HEAD FINDINGS Major intracranial vascular flow voids are stable. No restricted diffusion to suggest acute infarction. No midline shift, mass effect, evidence of mass lesion, extra-axial collection or acute intracranial hemorrhage. Cervicomedullary junction and pituitary are within normal limits. Stable cerebral volume. Stable ventricular prominence without acute ventriculomegaly. Patchy periventricular T2 and FLAIR hyperintensity has not significantly changed. Regressed hemosiderin in the right lateral ventricle since the 2012 comparison. No other chronic cerebral blood products identified. T2 heterogeneity throughout the deep gray matter nuclei has not significantly changed an appears to be in large part due to perivascular  spaces. Patchy T2 hyperintensity in the pons has not significantly changed. Stable and negative visualized internal auditory structures. Small volume retained secretions in the  nasopharynx. Stable and well pneumatized visualized paranasal sinuses and mastoids. Orbits soft tissues appear stable and normal aside from postoperative changes to the right globe. Negative scalp soft tissues. Negative for age visualized cervical spine. MRA HEAD FINDINGS Stable antegrade flow in the posterior circulation. MOTSA artifact suspected in the lower posterior fossa. No distal vertebral artery stenosis suspected. Bilateral PICA flow is visible. The vertebrobasilar junction and basilar artery remain normal without stenosis. SCA and PCA origins remain normal. Normal left posterior communicating artery, the right is diminutive or absent. Bilateral PCA branches are stable and within normal limits. Antegrade flow in both ICA siphons. No siphon stenosis. Ophthalmic and left posterior communicating artery origins remain normal. Carotid termini remain patent. MCA and ACA origins remain normal. Anterior communicating artery and visualized ACA branches are stable and within normal limits. Visualized bilateral MCA branches are stable and normal aside from mild tortuosity. IMPRESSION: 1.  No acute intracranial abnormality. 2. Expected evolution of the small volume intraventricular hemorrhage on the right in 2012. Otherwise stable noncontrast MRI appearance the brain, including chronic signal changes felt to be small vessel disease related. 3. Stable and negative intracranial MRA. Electronically Signed   By: Odessa Fleming M.D.   On: 08/17/2015 10:21   Mr Brain W Contrast  08/18/2015  CLINICAL DATA:  Acute encephalopathy. Patient not acting RIGHT, lethargic. EXAM: MRI HEAD WITH CONTRAST TECHNIQUE: Multiplanar, multiecho pulse sequences of the brain and surrounding structures were obtained with intravenous contrast. COMPARISON:  MR brain without  contrast 08/17/2015. CONTRAST:  20mL MULTIHANCE GADOBENATE DIMEGLUMINE 529 MG/ML IV SOLN FINDINGS: No abnormal enhancement of the brain or meninges. Major dural venous sinuses are patent. Atrophy and small vessel disease, better evaluated on prior MR. No concerning features when correlated with yesterday's MR. IMPRESSION: No acute abnormalities are seen on post infusion imaging. Electronically Signed   By: Elsie Stain M.D.   On: 08/18/2015 15:15   US Carotid Bilateral  08/17/2015  CLINICAL DATA:  CVA. EXAM: BILATERAL CAROTID DUPLEX ULTRASOUND TECHNIQUE: Wallace Cullens scale imaging, color Doppler and duplex ultrasound were performed of bilateral carotid and vertebral arteries in the neck. COMPARISON:  MRI 08/17/2015 . FINDINGS: Criteria: Quantification of carotid stenosis is based on velocity parameters that correlate the residual internal carotid diameter with NASCET-based stenosis levels, using the diameter of the distal internal carotid lumen as the denominator for stenosis measurement. The following velocity measurements were obtained: RIGHT ICA:  72/12 cm/sec CCA:  89/12 cm/sec SYSTOLIC ICA/CCA RATIO:  0.8 DIASTOLIC ICA/CCA RATIO:  1.1 ECA:  59 cm/sec LEFT ICA:  57/14 cm/sec CCA:  73/12 cm/sec SYSTOLIC ICA/CCA RATIO:  0.8 DIASTOLIC ICA/CCA RATIO:  1.1 ECA:  34 cm/sec RIGHT CAROTID ARTERY: No significant carotid atherosclerotic vascular disease. No flow limiting stenosis. RIGHT VERTEBRAL ARTERY:  Patent with antegrade flow. LEFT CAROTID ARTERY: Mild left carotid bifurcation proximal ICA atherosclerotic vascular plaque. No flow limiting stenosis. LEFT VERTEBRAL ARTERY:  Patent with antegrade flow. IMPRESSION: 1. Mild left carotid bifurcation proximal ICA calcified atherosclerotic vascular plaque. No flow limiting stenosis. Degree of stenosis less than 50%. Right carotid widely patent. 2.  Vertebral arteries are patent with antegrade flow. Electronically Signed   By: Maisie Fus  Register   On: 08/17/2015 10:48   Mr Maxine Glenn  Head/brain Wo Cm  08/17/2015  CLINICAL DATA:  82 year old male with stroke like symptoms, visual problems looking to the left. Initial encounter. EXAM: MRI HEAD WITHOUT CONTRAST MRA HEAD WITHOUT CONTRAST TECHNIQUE: Multiplanar, multiecho pulse sequences of the brain and surrounding structures were  obtained without intravenous contrast. Angiographic images of the head were obtained using MRA technique without contrast. COMPARISON:  Head CT without contrast 08/16/2015. [facility] Brain MRI and intracranial MRA 06/27/2010. FINDINGS: MRI HEAD FINDINGS Major intracranial vascular flow voids are stable. No restricted diffusion to suggest acute infarction. No midline shift, mass effect, evidence of mass lesion, extra-axial collection or acute intracranial hemorrhage. Cervicomedullary junction and pituitary are within normal limits. Stable cerebral volume. Stable ventricular prominence without acute ventriculomegaly. Patchy periventricular T2 and FLAIR hyperintensity has not significantly changed. Regressed hemosiderin in the right lateral ventricle since the 2012 comparison. No other chronic cerebral blood products identified. T2 heterogeneity throughout the deep gray matter nuclei has not significantly changed an appears to be in large part due to perivascular spaces. Patchy T2 hyperintensity in the pons has not significantly changed. Stable and negative visualized internal auditory structures. Small volume retained secretions in the nasopharynx. Stable and well pneumatized visualized paranasal sinuses and mastoids. Orbits soft tissues appear stable and normal aside from postoperative changes to the right globe. Negative scalp soft tissues. Negative for age visualized cervical spine. MRA HEAD FINDINGS Stable antegrade flow in the posterior circulation. MOTSA artifact suspected in the lower posterior fossa. No distal vertebral artery stenosis suspected. Bilateral PICA flow is visible. The vertebrobasilar junction  and basilar artery remain normal without stenosis. SCA and PCA origins remain normal. Normal left posterior communicating artery, the right is diminutive or absent. Bilateral PCA branches are stable and within normal limits. Antegrade flow in both ICA siphons. No siphon stenosis. Ophthalmic and left posterior communicating artery origins remain normal. Carotid termini remain patent. MCA and ACA origins remain normal. Anterior communicating artery and visualized ACA branches are stable and within normal limits. Visualized bilateral MCA branches are stable and normal aside from mild tortuosity. IMPRESSION: 1.  No acute intracranial abnormality. 2. Expected evolution of the small volume intraventricular hemorrhage on the right in 2012. Otherwise stable noncontrast MRI appearance the brain, including chronic signal changes felt to be small vessel disease related. 3. Stable and negative intracranial MRA. Electronically Signed   By: Odessa Fleming M.D.   On: 08/17/2015 10:21       Today   Subjective:   [name] [name]   Patient feels well awake  Objective:   Blood pressure 127/87, pulse 66, temperature 98.1 F (36.7 C), temperature source Oral, resp. rate 18, height 5\' 11"  (1.803 m), weight 94.008 kg (207 lb 4 oz), SpO2 94 %.  .  Intake/Output Summary (Last 24 hours) at 08/22/15 1114 Last data filed at 08/22/15 0900  Gross per 24 hour  Intake    120 ml  Output      0 ml  Net    120 ml    Exam VITAL SIGNS: Blood pressure 127/87, pulse 66, temperature 98.1 F (36.7 C), temperature source Oral, resp. rate 18, height 5\' 11"  (1.803 m), weight 94.008 kg (207 lb 4 oz), SpO2 94 %.  GENERAL:  82 y.o.-year-old patient lying in the bed with no acute distress.  EYES: Pupils equal, round, reactive to light and accommodation. No scleral icterus. Extraocular muscles intact.  HEENT: Head atraumatic, normocephalic. Oropharynx and nasopharynx clear.  NECK:  Supple, no jugular venous distention. No thyroid enlargement, no  tenderness.  LUNGS: Normal breath sounds bilaterally, no wheezing, rales,rhonchi or crepitation. No use of accessory muscles of respiration.  CARDIOVASCULAR: S1, S2 normal. No murmurs, rubs, or gallops.  ABDOMEN: Soft, nontender, nondistended. Bowel sounds present. No organomegaly or mass.  EXTREMITIES: No pedal edema, cyanosis, or clubbing.  NEUROLOGIC: Cranial nerves II through XII are intact. Muscle strength 5/5 in all extremities. Sensation intact. Gait not checked.  PSYCHIATRIC: The patient is alert and oriented x 3.  SKIN: No obvious rash, lesion, or ulcer.   Data Review     CBC w Diff: Lab Results  Component Value Date   WBC 6.2 08/20/2015   WBC 6.5 06/14/2015   HGB 12.2* 08/20/2015   HCT 36.4* 08/20/2015   HCT 39.4 06/14/2015   PLT 150 08/20/2015   PLT 185 06/14/2015   LYMPHOPCT 19 08/03/2015   MONOPCT 6 08/03/2015   EOSPCT 3 08/03/2015   BASOPCT 1 08/03/2015   CMP: Lab Results  Component Value Date   NA 135 08/20/2015   NA 138 06/14/2015   K 3.7 08/22/2015   CL 101 08/20/2015   CO2 27 08/20/2015   BUN 13 08/20/2015   BUN 15 06/14/2015   CREATININE 0.58* 08/20/2015   PROT 6.9 08/03/2015   PROT 6.4 06/14/2015   ALBUMIN 3.5 08/03/2015   ALBUMIN 3.6 06/14/2015   BILITOT 0.4 08/03/2015   BILITOT 0.3 06/14/2015   ALKPHOS 59 08/03/2015   AST 26 08/03/2015   ALT 14* 08/03/2015  .  Micro Results No results found for this or any previous visit (from the past 240 hour(s)).      Code Status Orders        Start     Ordered   08/17/15 0113  Full code   Continuous     08/17/15 0114    Code Status History    Date Active Date Inactive Code Status Order ID Comments User Context   08/17/2015  1:14 AM  Full Code [id number]  Joella Prince, MD ED    Advance Directive Documentation        Most Recent Value   Type of Advance Directive  Healthcare Power of Attorney   Pre-existing out of facility DNR order (yellow form or pink MOST form)     "MOST" Form in Place?             Follow-up Information    Follow up with [facility] A, MD In 3 weeks.   Specialty:  Cardiology   Contact information:   [address] [place] [address] [phone]       Follow up with md at snf In 3 days.      Discharge Medications     Medication List    TAKE these medications        acetaminophen 325 MG tablet  Commonly known as:  TYLENOL  Take 2 tablets (650 mg total) by mouth every 6 (six) hours as needed for mild pain (or Fever >/= 101).     amiodarone 200 MG tablet  Commonly known as:  PACERONE  Take 1 tablet (200 mg total) by mouth daily.     apixaban 5 MG Tabs tablet  Commonly known as:  ELIQUIS  Take 1 tablet (5 mg total) by mouth 2 (two) times daily.     aspirin EC 81 MG tablet  Take 81 mg by mouth daily.     benzonatate 100 MG capsule  Commonly known as:  TESSALON  Take 1 capsule (100 mg total) by mouth every 8 (eight) hours as needed for cough.     calcium carbonate 500 MG chewable tablet  Commonly known as:  TUMS - dosed in mg elemental calcium  Chew 2 tablets by mouth daily.  cefUROXime 500 MG tablet  Commonly known as:  CEFTIN  Take 1 tablet (500 mg total) by mouth 2 (two) times daily with a meal.     CLARITIN REDITABS 5 MG Tbdp  Generic drug:  Loratadine  Take by mouth 2 (two) times daily as needed.     docusate sodium 100 MG capsule  Commonly known as:  COLACE  Take 100 mg by mouth 2 (two) times daily as needed for mild constipation.     fluticasone 50 MCG/ACT nasal spray  Commonly known as:  FLONASE  Place 1 spray into both nostrils daily.     guaiFENesin 600 MG 12 hr tablet  Commonly known as:  MUCINEX  Take 1 tablet (600 mg total) by mouth 2 (two) times daily.     guaiFENesin 100 MG/5ML Soln  Commonly known as:  ROBITUSSIN  Take 5 mLs (100 mg total) by mouth 2 (two) times daily as needed for cough or to loosen phlegm.     multivitamin with minerals Tabs tablet  Take 1 tablet by mouth daily.            Total Time in preparing paper work, data evaluation and todays exam - 35 minutes  Auburn BilberryPATEL, Ryhanna Dunsmore M.D on 08/22/2015 at 11:14 AM  [facility]   Office  [phone]

## 2015-08-22 NOTE — Discharge Instructions (Signed)

## 2015-08-22 NOTE — Progress Notes (Signed)
Physical Therapy Treatment Patient Details Name: [name] [name] MRN: [id number] DOB: 11/28/1921 Today's Date: 08/22/2015    History of Present Illness 82 y.o. male past medical history of hyperlipidemia, coronary artery disease status post CABG, prior history of TIA and peripheral artery disease only on a baby aspirin. Came to hospital because he was complaining of some trouble with eye tracking tonight he was unable to look towards the left. Pt with some stroke-like symptoms, CT and MRI negative.    PT Comments    Pt in bed awake and ready for session.  Participated in exercises as described below.  To edge of bed with mod a x 2.  Stood x 3 at bedside with walker and max a x 2.  Pt unable to stand fully today from elevated surface.  Pt with large amount of flatulence today with movement.  Pt was unsafe to transfer to recliner at bedside due to standing quality and general weakness.  Pt incontinent of urine and was assisted with care by writer and CNA.  Family in for session.   Follow Up Recommendations  SNF     Equipment Recommendations       Recommendations for Other Services       Precautions / Restrictions Precautions Precautions: Fall Restrictions Weight Bearing Restrictions: No    Mobility  Bed Mobility Overal bed mobility: Needs Assistance;+2 for physical assistance Bed Mobility: Supine to Sit     Supine to sit: Mod assist;+2 for physical assistance Sit to supine: Max assist;+2 for physical assistance   General bed mobility comments: max a for rolling left and rigth for care  Transfers Overall transfer level: Needs assistance Equipment used: Rolling walker (2 wheeled) Transfers: Sit to/from Stand Sit to Stand: Max assist;+2 physical assistance;From elevated surface (unable to stand fully today)         General transfer comment: good effort but unsafe to transfer to chair today  Ambulation/Gait                 Stairs            Wheelchair  Mobility    Modified Rankin (Stroke Patients Only)       Balance Overall balance assessment: Needs assistance Sitting-balance support: Feet supported Sitting balance-Leahy Scale: Poor   Postural control: Posterior lean Standing balance support: Bilateral upper extremity supported Standing balance-Leahy Scale: Poor Standing balance comment: unable to stand fully today                    Cognition Arousal/Alertness: Awake/alert Behavior During Therapy: WFL for tasks assessed/performed Overall Cognitive Status: History of cognitive impairments - at baseline                      Exercises General Exercises - Lower Extremity Ankle Circles/Pumps: Strengthening;10 reps;Both Heel Slides: Strengthening;10 reps;Both Hip ABduction/ADduction: 10 reps;Both;AROM Straight Leg Raises: AAROM;10 reps;Both    General Comments        Pertinent Vitals/Pain Pain Assessment: No/denies pain    Home Living                      Prior Function            PT Goals (current goals can now be found in the care plan section)      Frequency  Min 2X/week    PT Plan Current plan remains appropriate    Co-evaluation  End of Session Equipment Utilized During Treatment: Gait belt Activity Tolerance: Patient tolerated treatment well Patient left: in bed;with call bell/phone within reach;with bed alarm set;with nursing/sitter in room;with family/visitor present     Time: 1015-1040 PT Time Calculation (min) (ACUTE ONLY): 25 min  Charges:  $Therapeutic Exercise: 8-22 mins $Therapeutic Activity: 8-22 mins                    G Codes:      Danielle Dess, PTA 08/22/2015, 11:21 AM

## 2015-08-22 NOTE — Progress Notes (Signed)
[facility].  No distress. Sl d/cd. dtrs at bedside. Denies pain.[facility] Ready [facility]

## 2015-08-22 NOTE — Clinical Social Work Note (Signed)
Pt [facility]. Facility has received discharge information and will be able to accept pt this afternoon after room is cleaned. RN will call report to [phone] and pt will go to Room [facility]-A. [facility]. Pt and family are aware and agreeable to discharge plan. CSW is signing off as no further needs identified.   Dede QuerySarah Lititia Sen, MSW, LCSW  Clinical Social Worker  [phone]

## 2015-08-22 NOTE — Clinical Social Work Placement (Signed)
   CLINICAL SOCIAL WORK PLACEMENT  NOTE  Date:  08/22/2015  Patient Details  Name: [name] [name] MRN: [id number] Date of Birth: 03/12/1921  Clinical Social Work is seeking post-discharge placement for this patient at the Skilled  Nursing Facility level of care (*CSW will initial, date and re-position this form in  chart as items are completed):  Yes   Patient/family provided with [facility] Clinical Social Work Department's list of facilities offering this level of care within the geographic area requested by the patient (or if unable, by the patient's family).  Yes   Patient/family informed of their freedom to choose among providers that offer the needed level of care, that participate in Medicare, Medicaid or managed care program needed by the patient, have an available bed and are willing to accept the patient.  Yes   Patient/family informed of [facility]'s ownership interest in [facility] and [facility], as well as of the fact that they are under no obligation to receive care at these facilities.  PASRR submitted to EDS on 08/19/15     PASRR number received on 08/19/15     Existing PASRR number confirmed on       FL2 transmitted to all facilities in geographic area requested by pt/family on 08/19/15     FL2 transmitted to all facilities within larger geographic area on       Patient informed that his/her managed care company has contracts with or will negotiate with certain facilities, including the following:        Yes   Patient/family informed of bed offers received.  [facility]     Physician recommends and patient chooses bed at  [facility](SNF)    [facility] 08/22/[facility]  [facility]     Patient family notified on 08/22/15 of transfer.  Name of family member notified:  Pt's daughters     PHYSICIAN       Additional Comment:     _______________________________________________ Dede QuerySarah Feather Berrie, LCSW 08/22/2015, 11:44 AM

## 2015-08-22 NOTE — Progress Notes (Signed)
SUBJECTIVE: still coughing a lot but the is on antibioticand cough syrup   Filed Vitals:   08/21/15 1437 08/21/15 2052 08/22/15 0509 08/22/15 0753  BP: 125/63 142/75 139/73 127/87  Pulse: 71 69 70 66  Temp: 97.9 F (36.6 C) 97.7 F (36.5 C) 97.8 F (36.6 C) 98.1 F (36.7 C)  TempSrc: Oral Oral Oral Oral  Resp: 20 18 18 18   Height:      Weight:      SpO2: 100% 97% 93% 94%    Intake/Output Summary (Last 24 hours) at 08/22/15 0842 Last data filed at 08/21/15 0900  Gross per 24 hour  Intake      0 ml  Output      0 ml  Net      0 ml    LABS: Basic Metabolic Panel:  Recent Labs  43/32/9506/18/17 0837 08/22/15 0602  NA 135  --   K 3.3* 3.7  CL 101  --   CO2 27  --   GLUCOSE 106*  --   BUN 13  --   CREATININE 0.58*  --   CALCIUM 8.1*  --    Liver Function Tests: No results for input(s): AST, ALT, ALKPHOS, BILITOT, PROT, ALBUMIN in the last 72 hours. No results for input(s): LIPASE, AMYLASE in the last 72 hours. CBC:  Recent Labs  08/20/15 0837  WBC 6.2  HGB 12.2*  HCT 36.4*  MCV 90.5  PLT 150   Cardiac Enzymes:  Recent Labs  08/20/15 0148  CKMB 4.4  TROPONINI <0.03   BNP: Invalid input(s): POCBNP D-Dimer: No results for input(s): DDIMER in the last 72 hours. Hemoglobin A1C: No results for input(s): HGBA1C in the last 72 hours. Fasting Lipid Panel: No results for input(s): CHOL, HDL, LDLCALC, TRIG, CHOLHDL, LDLDIRECT in the last 72 hours. Thyroid Function Tests: No results for input(s): TSH, T4TOTAL, T3FREE, THYROIDAB in the last 72 hours.  Invalid input(s): FREET3 Anemia Panel: No results for input(s): VITAMINB12, FOLATE, FERRITIN, TIBC, IRON, RETICCTPCT in the last 72 hours.   PHYSICAL EXAM General: Well developed, well nourished, in no acute distress HEENT:  Normocephalic and atramatic Neck:  No JVD.  Lungs: Clear bilaterally to auscultation and percussion. Heart: HRRR . Normal S1 and S2 without gallops or murmurs.  Abdomen: Bowel sounds are  positive, abdomen soft and non-tender  Msk:  Back normal, normal gait. Normal strength and tone for age. Extremities: No clubbing, cyanosis or edema.   Neuro: Alert and oriented X 3. Psych:  Good affect, responds appropriately  TELEMETRY: remains in sinus rhythm at 78 bpm  ASSESSMENT AND PLAN: paroxysmal atrial fibrillation currently in sinus rhythm on amiodarone 200 mg by mouth daily as well as Eliquis.  Active Problems:   TIA (transient ischemic attack)   Altered mental status    [name] [name] A, MD, [facility] 08/22/2015 8:42 AM

## 2015-09-02 ENCOUNTER — Encounter
Admit: 2015-09-02 | Discharge: 2015-09-02 | Disposition: A | Discharge status: Home / Self Care | Payer: Medicare Other | Source: Ambulatory Visit | Attending: Internal Medicine | Admitting: Internal Medicine

## 2015-09-02 DIAGNOSIS — I4891 Unspecified atrial fibrillation: Secondary | ICD-10-CM | POA: Insufficient documentation

## 2015-09-07 ENCOUNTER — Non-Acute Institutional Stay (SKILLED_NURSING_FACILITY): Payer: Medicare Other | Admitting: Gerontology

## 2015-09-07 DIAGNOSIS — I4891 Unspecified atrial fibrillation: Secondary | ICD-10-CM | POA: Diagnosis present

## 2015-09-07 DIAGNOSIS — I5022 Chronic systolic (congestive) heart failure: Secondary | ICD-10-CM | POA: Insufficient documentation

## 2015-09-07 DIAGNOSIS — I5020 Unspecified systolic (congestive) heart failure: Secondary | ICD-10-CM | POA: Diagnosis not present

## 2015-09-07 LAB — TSH: TSH: 3.01 u[IU]/mL (ref 0.350–4.500)

## 2015-09-07 NOTE — Progress Notes (Signed)
Location:  [facility] Service:  SNF [phone]) Provider:  Toni Arthurs, NP-C  Golden Pop, MD  Patient Care Team: Guadalupe Maple, MD as PCP - General (Unknown Physician Specialty) Carlena Bjornstad, MD as Consulting Physician (Cardiology) Valerie Roys, DO as Referring Physician [facility] Medicine)  Extended Emergency Contact Information Primary Emergency Contact: [place] Mobile Phone: [phone] Relation: Daughter Secondary Emergency Contact: Cardella,Denise Address: [address]          [place], [place] [address] [place] Phone: [phone] Relation: Daughter  Code Status:  Full Goals of care: Advanced Directive information Advanced Directives 08/17/2015  Does patient have an advance directive? Yes  Type of Advance Directive Healthcare Power of Attorney  Copy of advanced directive(s) in chart? No - copy requested     Chief Complaint  Patient presents with  . Shortness of Breath    HPI:  Pt is a 82 y.o. male seen today for an acute visit for f/u of bronchitis. Pt reports he feels good. Denies chest pain, dyspnea. Says his edematous feet "have always been like that." Denies productive cough, dyspnea.Sitter at bedside.    Past Medical History  Diagnosis Date  . CAD (coronary artery disease)     nuclear 01/2008, no ischemia, EF 60%, distal anterior scar  . Hyperlipidemia   . Syncope     .remote..no etiology  /  syncope 11/2008.. from acute leg pain  . Venous insufficiency     support hose  . Carotid artery disease ([place])     doppler 05/2009,  0-39% bilateral / doppler 06/2010 OK  . Elevated CPK     11/23/08.Marland KitchenMarland Kitchenprobably from debris from popliteal aneurysm  . Popliteal aneurysm ([place])     .repair..11/25/2008  Dr. Oneida Alar  . Speech problem     Neurologic event with brief infusion and speech difficulty.... January, 2011.... being assessed by neurology  February, 2011  . Hx of CABG     1998  .  Ejection fraction     EF 45%, echo,05/2009,apical hypo,  ?? posterior and lateral hypo  . Prominent abdominal aortic pulsation     doppler normal  . Intracranial hemorrhage (HCC)     Two small subependymal bleeds 06/2010,,ASA held  . Bradycardia     06/2010  . Edema     Ankle edema August, 2012  . TIA (transient ischemic attack)     Brief episode of confusion and speech difficulty, January, 2012  /  recurrent confusion speech difficulty September, 2012  . PAD (peripheral artery disease) (HCC)     Above the knee to below the knee popliteal bypass,, right  //    Doppler,  July, 2013, patent  . Fall at home July 2015   Past Surgical History  Procedure Laterality Date  . Coronary artery bypass graft  1998  . Popliteal bypass  with reversed greater saphenous vein from right leg.      Allergies  Allergen Reactions  . Penicillins Hives    Has patient had a PCN reaction causing immediate rash, facial/tongue/throat swelling, SOB or lightheadedness with hypotension: Yes Has patient had a PCN reaction causing severe rash involving mucus membranes or skin necrosis: Yes Has patient had a PCN reaction that required hospitalization No Has patient had a PCN reaction occurring within the last 10 years: No If all of the above answers are "NO", then may proceed with Cephalosporin use.   . Latex Rash  .  Sulfonamide Derivatives Rash      Medication List       This list is accurate as of: 09/07/15 11:32 PM.  Always use your most recent med list.               acetaminophen 325 MG tablet  Commonly known as:  TYLENOL  Take 2 tablets (650 mg total) by mouth every 6 (six) hours as needed for mild pain (or Fever >/= 101).     amiodarone 200 MG tablet  Commonly known as:  PACERONE  Take 1 tablet (200 mg total) by mouth daily.     apixaban 5 MG Tabs tablet  Commonly known as:  ELIQUIS  Take 1 tablet (5 mg total) by mouth 2 (two) times daily.     aspirin EC 81 MG tablet  Take 81 mg by mouth  daily.     benzonatate 100 MG capsule  Commonly known as:  TESSALON  Take 1 capsule (100 mg total) by mouth every 8 (eight) hours as needed for cough.     calcium carbonate 500 MG chewable tablet  Commonly known as:  TUMS - dosed in mg elemental calcium  Chew 2 tablets by mouth daily.     cefUROXime 500 MG tablet  Commonly known as:  CEFTIN  Take 1 tablet (500 mg total) by mouth 2 (two) times daily with a meal.     CLARITIN REDITABS 5 MG Tbdp  Generic drug:  Loratadine  Take by mouth 2 (two) times daily as needed.     docusate sodium 100 MG capsule  Commonly known as:  COLACE  Take 100 mg by mouth 2 (two) times daily as needed for mild constipation.     fluticasone 50 MCG/ACT nasal spray  Commonly known as:  FLONASE  Place 1 spray into both nostrils daily.     guaiFENesin 600 MG 12 hr tablet  Commonly known as:  MUCINEX  Take 1 tablet (600 mg total) by mouth 2 (two) times daily.     guaiFENesin 100 MG/5ML Soln  Commonly known as:  ROBITUSSIN  Take 5 mLs (100 mg total) by mouth 2 (two) times daily as needed for cough or to loosen phlegm.     multivitamin with minerals Tabs tablet  Take 1 tablet by mouth daily.        Review of Systems  Constitutional: Negative.   HENT: Negative.   Respiratory: Negative for cough, chest tightness, shortness of breath and wheezing.   Cardiovascular: Positive for leg swelling. Negative for chest pain.  Gastrointestinal: Negative.   Genitourinary: Negative.   Musculoskeletal: Negative.   Skin: Negative.   Neurological: Negative.   Psychiatric/Behavioral: Negative.   All other systems reviewed and are negative.   Immunization History  Administered Date(s) Administered  . Influenza,inj,Quad PF,36+ Mos 12/07/2014  . Pneumococcal Conjugate-13 11/09/2013  . Pneumococcal Polysaccharide-23 03/04/2000, 04/08/2007  . Tdap 11/14/2010  . Zoster 04/08/2007   Pertinent  Health Maintenance Due  Topic Date Due  . INFLUENZA VACCINE   10/03/2015  . PNA vac Low Risk Adult  Completed   Fall Risk  07/17/2015  Falls in the past year? Exclusion - non ambulatory   Functional Status Survey:    There were no vitals filed for this visit. There is no weight on file to calculate BMI. Physical Exam  Constitutional: He is oriented to person, place, and time. Vital signs are normal. He appears well-developed and well-nourished. He is active and cooperative.  Cardiovascular: Normal rate, normal  heart sounds, intact distal pulses and normal pulses.  An irregular rhythm present. Exam reveals no gallop, no distant heart sounds and no friction rub.   No murmur heard. 3+ B-pedal pitting edema  Pulmonary/Chest: Effort normal. No accessory muscle usage. No respiratory distress. He has decreased breath sounds in the right upper field, the right middle field, the left upper field and the left middle field. He has rales in the right lower field and the left lower field.  Neurological: He is alert and oriented to person, place, and time.  Skin: Skin is warm, dry and intact. He is not diaphoretic. No cyanosis. Nails show no clubbing.  Nursing note and vitals reviewed.   Labs reviewed:  Recent Labs  08/17/15 0240 08/18/15 0508 08/20/15 0837 08/22/15 0602  NA 133* 131* 135  --   K 4.1 3.6 3.3* 3.7  CL 97* 99* 101  --   CO2 _0 --   GLUCOSE 108* 111* 106*  --   BUN _1 --   CREATININE 0.67 0.65 0.58*  --   CALCIUM 8.9 8.2* 8.1*  --     Recent Labs  06/14/15 1558 08/03/15 1125  AST 22 26  ALT 11 14*  ALKPHOS 63 59  BILITOT 0.3 0.4  PROT 6.4 6.9  ALBUMIN 3.6 3.5    Recent Labs  06/14/15 1558 08/03/15 1125  08/17/15 0240 08/18/15 0508 08/20/15 0837  WBC 6.5 6.9  < > 7.4 7.1 6.2  NEUTROABS 4.0 4.9  --   --   --   --   HGB  --  13.3  < > 13.2 12.6* 12.2*  HCT 39.4 40.4  < > 38.9* 36.8* 36.4*  MCV 92 92.8  < > 90.1 89.8 90.5  PLT 185 165  < > 156 136* 150  < > = values in this interval not displayed. Lab  Results  Component Value Date   TSH 3.010 09/07/2015   Lab Results  Component Value Date   HGBA1C 5.3 08/17/2015   Lab Results  Component Value Date   CHOL 138 08/17/2015   HDL 41 08/17/2015   LDLCALC 87 08/17/2015   TRIG 52 08/17/2015   CHOLHDL 3.4 08/17/2015    Significant Diagnostic Results in last 30 days:  Dg Chest 1 View  08/20/2015  CLINICAL DATA:  Cough with fever this morning.  Jaw pain. EXAM: CHEST 1 VIEW COMPARISON:  08/18/2015 FINDINGS: Postoperative changes in the mediastinum. Shallow inspiration with atelectasis in the lung bases. Cardiac enlargement without vascular congestion. Colonic interposition under the right hemidiaphragm. Calcification of the aorta. Degenerative changes in the spine and shoulders. IMPRESSION: Shallow inspiration with atelectasis in the lung bases. Electronically Signed   By: Lucienne Capers M.D.   On: 08/20/2015 02:13   Dg Chest 1 View  08/18/2015  CLINICAL DATA:  Lethargy, cough EXAM: CHEST 1 VIEW COMPARISON:  08/16/2015 FINDINGS: Cardiomediastinal silhouette is stable. No acute infiltrate or pleural effusion. No pulmonary edema. Stable chronic elevation of the left hemidiaphragm with left basilar atelectasis. Status post median sternotomy. IMPRESSION: No active disease. Chronic elevation of the left hemidiaphragm with left basilar atelectasis. Electronically Signed   By: Lahoma Crocker M.D.   On: 08/18/2015 15:53   Dg Chest 2 View  08/21/2015  CLINICAL DATA:  82 year old male with possible stroke. Atrial fibrillation. Initial encounter. EXAM: CHEST  2 VIEW COMPARISON:  08/20/2015 and earlier. FINDINGS: Seated AP and lateral views of the chest. Continued moderate elevation of the  left hemidiaphragm with abundant subjacent bowel gas. Bowel gas in the right abdomen has regressed since 08/16/2015. Stable cardiomegaly and mediastinal contours. Sequelae of CABG. No pneumothorax, consolidation, or definite pleural effusion. Pulmonary vascularity is increased.  Stable visualized osseous structures. Calcified aortic atherosclerosis. IMPRESSION: Increased pulmonary vascularity since 08/16/2015, consider mild or developing interstitial edema. No definite pleural effusion or other new cardiopulmonary abnormality. Electronically Signed   By: Genevie Ann M.D.   On: 08/21/2015 13:56   Dg Chest 2 View  08/16/2015  CLINICAL DATA:  Cough EXAM: CHEST  2 VIEW COMPARISON:  08/03/2015 FINDINGS: Cardiomediastinal silhouette is stable. Again noted moderate gaseous distension of the stomach. Status post CABG. Osteopenia and mild degenerative change thoracic spine. No infiltrate or pulmonary edema. IMPRESSION: No active disease. Again noted moderate gaseous distension of the stomach. Electronically Signed   By: Lahoma Crocker M.D.   On: 08/16/2015 20:14   Ct Head Wo Contrast  08/16/2015  CLINICAL DATA:  Sudden blindness in the left eye. EXAM: CT HEAD WITHOUT CONTRAST TECHNIQUE: Contiguous axial images were obtained from the base of the skull through the vertex without intravenous contrast. COMPARISON:  08/03/2015 FINDINGS: Stable ventriculomegaly. There is stable low-density in the periventricular white matter. Evidence for mild cerebral atrophy. No evidence for acute hemorrhage, mass lesion, midline shift or large infarct. Mild mucosal disease in the right ethmoid air cells. Otherwise, the visualized paranasal sinuses are clear. Mastoid air cells are clear. No calvarial fracture. IMPRESSION: No acute intracranial abnormality. Stable ventriculomegaly. Stable mild atrophy and evidence for chronic small vessel ischemic changes. Electronically Signed   By: Markus Daft M.D.   On: 08/16/2015 20:08   Mr Brain Wo Contrast  08/17/2015  CLINICAL DATA:  82 year old male with stroke like symptoms, visual problems looking to the left. Initial encounter. EXAM: MRI HEAD WITHOUT CONTRAST MRA HEAD WITHOUT CONTRAST TECHNIQUE: Multiplanar, multiecho pulse sequences of the brain and surrounding structures  were obtained without intravenous contrast. Angiographic images of the head were obtained using MRA technique without contrast. COMPARISON:  Head CT without contrast 08/16/2015. [facility] Brain MRI and intracranial MRA 06/27/2010. FINDINGS: MRI HEAD FINDINGS Major intracranial vascular flow voids are stable. No restricted diffusion to suggest acute infarction. No midline shift, mass effect, evidence of mass lesion, extra-axial collection or acute intracranial hemorrhage. Cervicomedullary junction and pituitary are within normal limits. Stable cerebral volume. Stable ventricular prominence without acute ventriculomegaly. Patchy periventricular T2 and FLAIR hyperintensity has not significantly changed. Regressed hemosiderin in the right lateral ventricle since the 2012 comparison. No other chronic cerebral blood products identified. T2 heterogeneity throughout the deep gray matter nuclei has not significantly changed an appears to be in large part due to perivascular spaces. Patchy T2 hyperintensity in the pons has not significantly changed. Stable and negative visualized internal auditory structures. Small volume retained secretions in the nasopharynx. Stable and well pneumatized visualized paranasal sinuses and mastoids. Orbits soft tissues appear stable and normal aside from postoperative changes to the right globe. Negative scalp soft tissues. Negative for age visualized cervical spine. MRA HEAD FINDINGS Stable antegrade flow in the posterior circulation. MOTSA artifact suspected in the lower posterior fossa. No distal vertebral artery stenosis suspected. Bilateral PICA flow is visible. The vertebrobasilar junction and basilar artery remain normal without stenosis. SCA and PCA origins remain normal. Normal left posterior communicating artery, the right is diminutive or absent. Bilateral PCA branches are stable and within normal limits. Antegrade flow in both ICA siphons. No siphon stenosis. Ophthalmic  and left  posterior communicating artery origins remain normal. Carotid termini remain patent. MCA and ACA origins remain normal. Anterior communicating artery and visualized ACA branches are stable and within normal limits. Visualized bilateral MCA branches are stable and normal aside from mild tortuosity. IMPRESSION: 1.  No acute intracranial abnormality. 2. Expected evolution of the small volume intraventricular hemorrhage on the right in 2012. Otherwise stable noncontrast MRI appearance the brain, including chronic signal changes felt to be small vessel disease related. 3. Stable and negative intracranial MRA. Electronically Signed   By: Genevie Ann M.D.   On: 08/17/2015 10:21   Mr Brain W Contrast  08/18/2015  CLINICAL DATA:  Acute encephalopathy. Patient not acting RIGHT, lethargic. EXAM: MRI HEAD WITH CONTRAST TECHNIQUE: Multiplanar, multiecho pulse sequences of the brain and surrounding structures were obtained with intravenous contrast. COMPARISON:  MR brain without contrast 08/17/2015. CONTRAST:  67m MULTIHANCE GADOBENATE DIMEGLUMINE 529 MG/ML IV SOLN FINDINGS: No abnormal enhancement of the brain or meninges. Major dural venous sinuses are patent. Atrophy and small vessel disease, better evaluated on prior MR. No concerning features when correlated with yesterday's MR. IMPRESSION: No acute abnormalities are seen on post infusion imaging. Electronically Signed   By: JStaci RighterM.D.   On: 08/18/2015 15:15   UKoreaCarotid Bilateral  08/17/2015  CLINICAL DATA:  CVA. EXAM: BILATERAL CAROTID DUPLEX ULTRASOUND TECHNIQUE: GPearline Cablesscale imaging, color Doppler and duplex ultrasound were performed of bilateral carotid and vertebral arteries in the neck. COMPARISON:  MRI 08/17/2015 . FINDINGS: Criteria: Quantification of carotid stenosis is based on velocity parameters that correlate the residual internal carotid diameter with NASCET-based stenosis levels, using the diameter of the distal internal carotid lumen as the  denominator for stenosis measurement. The following velocity measurements were obtained: RIGHT ICA:  72/12 cm/sec CCA:  831/51cm/sec SYSTOLIC ICA/CCA RATIO:  0.8 DIASTOLIC ICA/CCA RATIO:  1.1 ECA:  59 cm/sec LEFT ICA:  57/14 cm/sec CCA:  776/16cm/sec SYSTOLIC ICA/CCA RATIO:  0.8 DIASTOLIC ICA/CCA RATIO:  1.1 ECA:  34 cm/sec RIGHT CAROTID ARTERY: No significant carotid atherosclerotic vascular disease. No flow limiting stenosis. RIGHT VERTEBRAL ARTERY:  Patent with antegrade flow. LEFT CAROTID ARTERY: Mild left carotid bifurcation proximal ICA atherosclerotic vascular plaque. No flow limiting stenosis. LEFT VERTEBRAL ARTERY:  Patent with antegrade flow. IMPRESSION: 1. Mild left carotid bifurcation proximal ICA calcified atherosclerotic vascular plaque. No flow limiting stenosis. Degree of stenosis less than 50%. Right carotid widely patent. 2.  Vertebral arteries are patent with antegrade flow. Electronically Signed   By: TMarcello Moores Register   On: 08/17/2015 10:48   Mr MJodene NamHead/brain Wo Cm  08/17/2015  CLINICAL DATA:  82year old male with stroke like symptoms, visual problems looking to the left. Initial encounter. EXAM: MRI HEAD WITHOUT CONTRAST MRA HEAD WITHOUT CONTRAST TECHNIQUE: Multiplanar, multiecho pulse sequences of the brain and surrounding structures were obtained without intravenous contrast. Angiographic images of the head were obtained using MRA technique without contrast. COMPARISON:  Head CT [facility] contrast [facility]2015[facility] FINDINGS: MRI HEAD FINDINGS Major intracranial vascular flow voids are stable. No restricted diffusion to suggest acute infarction. No midline shift, mass effect, evidence of mass lesion, extra-axial collection or acute intracranial hemorrhage. Cervicomedullary junction and pituitary are within normal limits. Stable cerebral volume. Stable ventricular prominence without acute ventriculomegaly. Patchy periventricular T2 and  FLAIR hyperintensity has not significantly changed. Regressed hemosiderin in the right lateral ventricle since the 2012 comparison. No other chronic cerebral blood products identified. T2 heterogeneity  throughout the deep gray matter nuclei has not significantly changed an appears to be in large part due to perivascular spaces. Patchy T2 hyperintensity in the pons has not significantly changed. Stable and negative visualized internal auditory structures. Small volume retained secretions in the nasopharynx. Stable and well pneumatized visualized paranasal sinuses and mastoids. Orbits soft tissues appear stable and normal aside from postoperative changes to the right globe. Negative scalp soft tissues. Negative for age visualized cervical spine. MRA HEAD FINDINGS Stable antegrade flow in the posterior circulation. MOTSA artifact suspected in the lower posterior fossa. No distal vertebral artery stenosis suspected. Bilateral PICA flow is visible. The vertebrobasilar junction and basilar artery remain normal without stenosis. SCA and PCA origins remain normal. Normal left posterior communicating artery, the right is diminutive or absent. Bilateral PCA branches are stable and within normal limits. Antegrade flow in both ICA siphons. No siphon stenosis. Ophthalmic and left posterior communicating artery origins remain normal. Carotid termini remain patent. MCA and ACA origins remain normal. Anterior communicating artery and visualized ACA branches are stable and within normal limits. Visualized bilateral MCA branches are stable and normal aside from mild tortuosity. IMPRESSION: 1.  No acute intracranial abnormality. 2. Expected evolution of the small volume intraventricular hemorrhage on the right in 2012. Otherwise stable noncontrast MRI appearance the brain, including chronic signal changes felt to be small vessel disease related. 3. Stable and negative intracranial MRA. Electronically Signed   By: Genevie Ann M.D.   On:  08/17/2015 10:21    Assessment/Plan 1. Unspecified systolic (congestive) heart failure ([place])  Labs  cxr as listed  Spironolactone 25 mg po Q day  Lasix 40 mg IM x 1 now  Elevate feet when at rest  Family/ staff Communication:   Total Time: 35 minutes  Documentation: 20 minutes  Face to Face: 15 minutes  Family/Phone:   Labs/tests ordered: cbc, met c 7/11; 2 V CXR  Vikki Ports, NP-C [facility] [address], [place] [address] Cell Phone (Mon-Fri 8am-5pm):  [phone] On Call:  [phone] & follow prompts after 5pm & weekends Office Phone:  [phone] Office Fax:  [phone]

## 2015-09-12 ENCOUNTER — Ambulatory Visit: Payer: Medicare Other

## 2015-09-12 DIAGNOSIS — I4891 Unspecified atrial fibrillation: Secondary | ICD-10-CM | POA: Diagnosis not present

## 2015-09-12 LAB — CBC WITH DIFFERENTIAL/PLATELET
BASOS ABS: 0 10*3/uL (ref 0–0.1)
Basophils Relative: 1 %
EOS PCT: 6 %
Eosinophils Absolute: 0.4 10*3/uL (ref 0–0.7)
HEMATOCRIT: 36.9 % — AB (ref 40.0–52.0)
Hemoglobin: 12.8 g/dL — ABNORMAL LOW (ref 13.0–18.0)
LYMPHS ABS: 1 10*3/uL (ref 1.0–3.6)
Lymphocytes Relative: 15 %
MCH: 31.5 pg (ref 26.0–34.0)
MCHC: 34.6 g/dL (ref 32.0–36.0)
MCV: 91.2 fL (ref 80.0–100.0)
MONO ABS: 0.6 10*3/uL (ref 0.2–1.0)
MONOS PCT: 8 %
NEUTROS ABS: 4.8 10*3/uL (ref 1.4–6.5)
Neutrophils Relative %: 70 %
PLATELETS: 143 10*3/uL — AB (ref 150–440)
RBC: 4.05 MIL/uL — ABNORMAL LOW (ref 4.40–5.90)
RDW: 14.1 % (ref 11.5–14.5)
WBC: 6.8 10*3/uL (ref 3.8–10.6)

## 2015-09-12 LAB — COMPREHENSIVE METABOLIC PANEL
ALT: 11 U/L — ABNORMAL LOW (ref 17–63)
AST: 21 U/L (ref 15–41)
Albumin: 2.9 g/dL — ABNORMAL LOW (ref 3.5–5.0)
Alkaline Phosphatase: 51 U/L (ref 38–126)
Anion gap: 11 (ref 5–15)
BILIRUBIN TOTAL: 0.3 mg/dL (ref 0.3–1.2)
BUN: 16 mg/dL (ref 6–20)
CALCIUM: 8.4 mg/dL — AB (ref 8.9–10.3)
CO2: 23 mmol/L (ref 22–32)
CREATININE: 0.71 mg/dL (ref 0.61–1.24)
Chloride: 100 mmol/L — ABNORMAL LOW (ref 101–111)
GFR calc Af Amer: >60 mL/min (ref >60)
GFR calc non Af Amer: >60 mL/min (ref >60)
GLUCOSE: 86 mg/dL (ref 65–99)
Potassium: 4.9 mmol/L (ref 3.5–5.1)
Sodium: 134 mmol/L — ABNORMAL LOW (ref 135–145)
Total Protein: 5.9 g/dL — ABNORMAL LOW (ref 6.5–8.1)

## 2015-10-02 ENCOUNTER — Non-Acute Institutional Stay (SKILLED_NURSING_FACILITY): Payer: Medicare Other | Admitting: Gerontology

## 2015-10-02 DIAGNOSIS — R059 Cough, unspecified: Secondary | ICD-10-CM

## 2015-10-02 DIAGNOSIS — R05 Cough: Secondary | ICD-10-CM | POA: Diagnosis not present

## 2015-10-02 NOTE — Progress Notes (Signed)
Location:  [facility] Service:  SNF [phone]) Provider:  Lorenso Quarry, NP-C  [name], MD  Patient Care Team: Steele Sizer, MD as PCP - General (Unknown Physician Specialty) Luis Abed, MD as Consulting Physician (Cardiology) Dorcas Carrow, DO as Referring Physician [facility] Medicine)  Extended Emergency Contact Information Primary Emergency Contact: Mitchell [place] Mobile Phone: [phone] Relation: Daughter Secondary Emergency Contact: Cardella,Denise Address: [address]          [place], [place] [address] [place] Home Phone: [phone] Relation: Daughter  Code Status:  Full Goals of care: Advanced Directive information Advanced Directives 08/17/2015  Does patient have an advance directive? Yes  Type of Advance Directive Healthcare Power of Attorney  Does patient want to make changes to advanced directive? -  Copy of advanced directive(s) in chart? No - copy requested     Chief Complaint  Patient presents with  . Follow-up    HPI:  Pt is a 82 y.o. male seen today for an acute visit for cough. Pt is in facility for rehab from TIA, AMS, Bronchitis. Pt has increased weakness, instability. Has a sitter with him. Sitter reports he has a persistent cough with white or yellow phlygm. He is now able to expectorate the phlygm. He has been afebrile. No chest pain, no shortness of breath. No other complaints. Very pleasant man.   Past Medical History:  Diagnosis Date  . Bradycardia    06/2010  . CAD (coronary artery disease)    nuclear 01/2008, no ischemia, EF 60%, distal anterior scar  . Carotid artery disease (HCC)    doppler 05/2009,  0-39% bilateral / doppler 06/2010 OK  . Edema    Ankle edema August, 2012  . Ejection fraction    EF 45%, echo,05/2009,apical hypo,  ?? posterior and lateral hypo  . Elevated CPK    11/23/08.Marland KitchenMarland Kitchenprobably from debris from popliteal aneurysm  . Fall at home July 2015   . Hx of CABG    1998  . Hyperlipidemia   . Intracranial hemorrhage (HCC)    Two small subependymal bleeds 06/2010,,ASA held  . PAD (peripheral artery disease) (HCC)    Above the knee to below the knee popliteal bypass,, right  //    Doppler,  July, 2013, patent  . Popliteal aneurysm (HCC)    .repair..11/25/2008  Dr. Darrick Penna  . Prominent abdominal aortic pulsation    doppler normal  . Speech problem    Neurologic event with brief infusion and speech difficulty.... January, 2011.... being assessed by neurology  February, 2011  . Syncope    .remote..no etiology  /  syncope 11/2008.. from acute leg pain  . TIA (transient ischemic attack)    Brief episode of confusion and speech difficulty, January, 2012  /  recurrent confusion speech difficulty September, 2012  . Venous insufficiency    support hose   Past Surgical History:  Procedure Laterality Date  . CORONARY ARTERY BYPASS GRAFT  1998  . popliteal bypass  with reversed greater saphenous vein from right leg.      Allergies  Allergen Reactions  . Penicillins Hives    Has patient had a PCN reaction causing immediate rash, facial/tongue/throat swelling, SOB or lightheadedness with hypotension: Yes Has patient had a PCN reaction causing severe rash involving mucus membranes or skin necrosis: Yes Has patient had a PCN reaction that required hospitalization No Has patient had a PCN reaction occurring within the  last 10 years: No If all of the above answers are "NO", then may proceed with Cephalosporin use.   . Latex Rash  . Sulfonamide Derivatives Rash      Medication List       Accurate as of 10/02/15 10:30 PM. Always use your most recent med list.          acetaminophen 325 MG tablet Commonly known as:  TYLENOL Take 2 tablets (650 mg total) by mouth every 6 (six) hours as needed for mild pain (or Fever >/= 101).   amiodarone 200 MG tablet Commonly known as:  PACERONE Take 1 tablet (200 mg total) by mouth daily.     apixaban 5 MG Tabs tablet Commonly known as:  ELIQUIS Take 1 tablet (5 mg total) by mouth 2 (two) times daily.   aspirin EC 81 MG tablet Take 81 mg by mouth daily.   benzonatate 100 MG capsule Commonly known as:  TESSALON Take 1 capsule (100 mg total) by mouth every 8 (eight) hours as needed for cough.   calcium carbonate 500 MG chewable tablet Commonly known as:  TUMS - dosed in mg elemental calcium Chew 2 tablets by mouth daily.   cefUROXime 500 MG tablet Commonly known as:  CEFTIN Take 1 tablet (500 mg total) by mouth 2 (two) times daily with a meal.   CLARITIN REDITABS 5 MG Tbdp Generic drug:  Loratadine Take by mouth 2 (two) times daily as needed.   docusate sodium 100 MG capsule Commonly known as:  COLACE Take 100 mg by mouth 2 (two) times daily as needed for mild constipation.   fluticasone 50 MCG/ACT nasal spray Commonly known as:  FLONASE Place 1 spray into both nostrils daily.   guaiFENesin 600 MG 12 hr tablet Commonly known as:  MUCINEX Take 1 tablet (600 mg total) by mouth 2 (two) times daily.   guaiFENesin 100 MG/5ML Soln Commonly known as:  ROBITUSSIN Take 5 mLs (100 mg total) by mouth 2 (two) times daily as needed for cough or to loosen phlegm.   multivitamin with minerals Tabs tablet Take 1 tablet by mouth daily.       Review of Systems  Constitutional: Negative for activity change, appetite change, chills, diaphoresis and fever.  HENT: Positive for congestion, postnasal drip, rhinorrhea and sneezing. Negative for facial swelling, nosebleeds, sinus pressure, sore throat, trouble swallowing and voice change.   Eyes: Negative for pain, redness and visual disturbance.  Respiratory: Positive for cough. Negative for apnea, choking, chest tightness, shortness of breath and wheezing.   Cardiovascular: Positive for leg swelling. Negative for chest pain and palpitations.  Gastrointestinal: Negative for abdominal distention, abdominal pain, constipation,  diarrhea and nausea.  Genitourinary: Negative for difficulty urinating, dysuria, frequency and urgency.  Musculoskeletal: Negative for back pain, gait problem and myalgias. Arthralgias: typical arthritis.  Skin: Negative for color change, pallor, rash and wound.  Neurological: Negative for dizziness, tremors, syncope, speech difficulty, weakness, numbness and headaches.  Psychiatric/Behavioral: Negative for agitation and behavioral problems.  All other systems reviewed and are negative.   Immunization History  Administered Date(s) Administered  . Influenza,inj,Quad PF,36+ Mos 12/07/2014  . Pneumococcal Conjugate-13 11/09/2013  . Pneumococcal Polysaccharide-23 03/04/2000, 04/08/2007  . Tdap 11/14/2010  . Zoster 04/08/2007   Pertinent  Health Maintenance Due  Topic Date Due  . INFLUENZA VACCINE  10/03/2015  . PNA vac Low Risk Adult  Completed   Fall Risk  07/17/2015  Falls in the past year? Exclusion - non ambulatory  Functional Status Survey:    Vitals:   10/02/15 2219  BP: 120/72  Pulse: 73  Resp: 19  Temp: 97.3 F (36.3 C)  SpO2: 93%  Weight: 196 lb 8 oz (89.1 kg)   Body mass index is 27.41 kg/m. Physical Exam  Constitutional: He is oriented to person, place, and time. Vital signs are normal. He appears well-developed and well-nourished. He is active and cooperative. He does not appear ill. No distress.  HENT:  Head: Normocephalic and atraumatic.  Mouth/Throat: Uvula is midline, oropharynx is clear and moist and mucous membranes are normal. Mucous membranes are not pale, not dry and not cyanotic.  Eyes: Conjunctivae, EOM and lids are normal. Pupils are equal, round, and reactive to light.  Neck: Trachea normal, normal range of motion and full passive range of motion without pain. Neck supple. No JVD present. No tracheal deviation, no edema and no erythema present. No thyromegaly present.  Cardiovascular: Normal rate, normal heart sounds, intact distal pulses and normal  pulses.  An irregularly irregular rhythm present. Exam reveals no gallop, no distant heart sounds and no friction rub.   No murmur heard. Pulmonary/Chest: Effort normal and breath sounds normal. No accessory muscle usage. No respiratory distress. He has no decreased breath sounds. He has no wheezes. He has no rhonchi. He has no rales. He exhibits no tenderness.  Abdominal: Normal appearance and bowel sounds are normal. He exhibits no distension and no ascites. There is no tenderness.  Musculoskeletal: Normal range of motion. He exhibits no edema or tenderness.  Expected osteoarthritis, stiffness  Neurological: He is alert and oriented to person, place, and time. He has normal strength.  Skin: Skin is warm, dry and intact. He is not diaphoretic. No cyanosis. No pallor. Nails show no clubbing.  Psychiatric: He has a normal mood and affect. His speech is normal and behavior is normal. Judgment and thought content normal. Cognition and memory are normal.  Nursing note and vitals reviewed.   Labs reviewed:  Recent Labs  08/18/15 0508 08/20/15 0837 08/22/15 0602 09/12/15 0651  NA 131* 135  --  134*  K 3.6 3.3* 3.7 4.9  CL 99* 101  --  100*  CO2 24 27  --  23  GLUCOSE 111* 106*  --  86  BUN 11 13  --  16  CREATININE 0.65 0.58*  --  0.71  CALCIUM 8.2* 8.1*  --  8.4*    Recent Labs  06/14/15 1558 08/03/15 1125 09/12/15 0651  AST 22 26 21   ALT 11 14* 11*  ALKPHOS 63 59 51  BILITOT 0.3 0.4 0.3  PROT 6.4 6.9 5.9*  ALBUMIN 3.6 3.5 2.9*    Recent Labs  06/14/15 1558 08/03/15 1125  08/18/15 0508 08/20/15 0837 09/12/15 0651  WBC 6.5 6.9  < > 7.1 6.2 6.8  NEUTROABS 4.0 4.9  --   --   --  4.8  HGB  --  13.3  < > 12.6* 12.2* 12.8*  HCT 39.4 40.4  < > 36.8* 36.4* 36.9*  MCV 92 92.8  < > 89.8 90.5 91.2  PLT 185 165  < > 136* 150 143*  < > = values in this interval not displayed. Lab Results  Component Value Date   TSH 3.010 09/07/2015   Lab Results  Component Value Date    HGBA1C 5.3 08/17/2015   Lab Results  Component Value Date   CHOL 138 08/17/2015   HDL 41 08/17/2015   LDLCALC 87 08/17/2015  TRIG 52 08/17/2015   CHOLHDL 3.4 08/17/2015    Significant Diagnostic Results in last 30 days:  No results found.  Assessment/Plan 1. Cough  Continue scheduled mucinex, encourage po fluid intake; schedule loratadine 5 mg BID rather than prn, Continue QID duonebs x 2 more days, Tessalon Perles prn, continue fluticasone 50 mg- 1 spray each nostril daily  Family/ staff Communication:   Total Time: 30 minutes  Documentation: 15 minutes  Face to Face: 15 minutes  Family/Phone:   Labs/tests ordered:  none  Medication list reviewed and assessed for continued appropriateness.  Brynda Rim, NP-C Geriatrics [facility] [phone], [place] [address] Cell Phone (Mon-Fri 8am-5pm):  [phone] On Call:  [phone] & follow prompts after 5pm & weekends Office Phone:  [phone] Office Fax:  [phone]

## 2015-10-03 ENCOUNTER — Encounter
Admission: RE | Admit: 2015-10-03 | Discharge: 2015-10-03 | Disposition: A | Discharge status: Home / Self Care | Payer: Medicare Other | Source: Ambulatory Visit | Attending: Internal Medicine | Admitting: Internal Medicine

## 2015-10-05 ENCOUNTER — Emergency Department: Payer: Medicare Other

## 2015-10-05 ENCOUNTER — Inpatient Hospital Stay
Admission: EM | Admit: 2015-10-05 | Discharge: 2015-10-08 | DRG: 101 | Disposition: A | Discharge status: Home Health | Payer: Medicare Other | Attending: Internal Medicine | Admitting: Internal Medicine

## 2015-10-05 DIAGNOSIS — Z87891 Personal history of nicotine dependence: Secondary | ICD-10-CM

## 2015-10-05 DIAGNOSIS — J209 Acute bronchitis, unspecified: Secondary | ICD-10-CM | POA: Diagnosis present

## 2015-10-05 DIAGNOSIS — N390 Urinary tract infection, site not specified: Secondary | ICD-10-CM | POA: Diagnosis present

## 2015-10-05 DIAGNOSIS — G40409 Other generalized epilepsy and epileptic syndromes, not intractable, without status epilepticus: Principal | ICD-10-CM | POA: Diagnosis present

## 2015-10-05 DIAGNOSIS — B965 Pseudomonas (aeruginosa) (mallei) (pseudomallei) as the cause of diseases classified elsewhere: Secondary | ICD-10-CM | POA: Diagnosis present

## 2015-10-05 DIAGNOSIS — Z833 Family history of diabetes mellitus: Secondary | ICD-10-CM | POA: Diagnosis not present

## 2015-10-05 DIAGNOSIS — R569 Unspecified convulsions: Secondary | ICD-10-CM

## 2015-10-05 DIAGNOSIS — I739 Peripheral vascular disease, unspecified: Secondary | ICD-10-CM | POA: Diagnosis present

## 2015-10-05 DIAGNOSIS — Z8673 Personal history of transient ischemic attack (TIA), and cerebral infarction without residual deficits: Secondary | ICD-10-CM

## 2015-10-05 DIAGNOSIS — I4891 Unspecified atrial fibrillation: Secondary | ICD-10-CM | POA: Diagnosis present

## 2015-10-05 DIAGNOSIS — Z823 Family history of stroke: Secondary | ICD-10-CM | POA: Diagnosis not present

## 2015-10-05 DIAGNOSIS — E785 Hyperlipidemia, unspecified: Secondary | ICD-10-CM | POA: Diagnosis present

## 2015-10-05 DIAGNOSIS — I5022 Chronic systolic (congestive) heart failure: Secondary | ICD-10-CM | POA: Diagnosis present

## 2015-10-05 DIAGNOSIS — Z951 Presence of aortocoronary bypass graft: Secondary | ICD-10-CM | POA: Diagnosis not present

## 2015-10-05 DIAGNOSIS — J40 Bronchitis, not specified as acute or chronic: Secondary | ICD-10-CM

## 2015-10-05 DIAGNOSIS — Z7982 Long term (current) use of aspirin: Secondary | ICD-10-CM | POA: Diagnosis not present

## 2015-10-05 DIAGNOSIS — I2510 Atherosclerotic heart disease of native coronary artery without angina pectoris: Secondary | ICD-10-CM | POA: Diagnosis present

## 2015-10-05 DIAGNOSIS — Z7901 Long term (current) use of anticoagulants: Secondary | ICD-10-CM

## 2015-10-05 HISTORY — DX: Unspecified atrial fibrillation: I48.91

## 2015-10-05 LAB — CBC WITH DIFFERENTIAL/PLATELET
BASOS ABS: 0 10*3/uL (ref 0–0.1)
BASOS PCT: 1 %
EOS ABS: 0.3 10*3/uL (ref 0–0.7)
EOS PCT: 3 %
HCT: 42 % (ref 40.0–52.0)
Hemoglobin: 14.6 g/dL (ref 13.0–18.0)
Lymphocytes Relative: 7 %
Lymphs Abs: 0.6 10*3/uL — ABNORMAL LOW (ref 1.0–3.6)
MCH: 31.1 pg (ref 26.0–34.0)
MCHC: 34.7 g/dL (ref 32.0–36.0)
MCV: 89.7 fL (ref 80.0–100.0)
MONO ABS: 0.4 10*3/uL (ref 0.2–1.0)
Monocytes Relative: 4 %
Neutro Abs: 7.7 10*3/uL — ABNORMAL HIGH (ref 1.4–6.5)
Neutrophils Relative %: 85 %
PLATELETS: 167 10*3/uL (ref 150–440)
RBC: 4.68 MIL/uL (ref 4.40–5.90)
RDW: 14.6 % — AB (ref 11.5–14.5)
WBC: 9 10*3/uL (ref 3.8–10.6)

## 2015-10-05 LAB — BASIC METABOLIC PANEL
ANION GAP: 10 (ref 5–15)
BUN: 23 mg/dL — ABNORMAL HIGH (ref 6–20)
CALCIUM: 9.3 mg/dL (ref 8.9–10.3)
CO2: 27 mmol/L (ref 22–32)
Chloride: 95 mmol/L — ABNORMAL LOW (ref 101–111)
Creatinine, Ser: 0.78 mg/dL (ref 0.61–1.24)
GFR calc Af Amer: >60 mL/min (ref >60)
Glucose, Bld: 107 mg/dL — ABNORMAL HIGH (ref 65–99)
POTASSIUM: 4.4 mmol/L (ref 3.5–5.1)
SODIUM: 132 mmol/L — AB (ref 135–145)

## 2015-10-05 LAB — TROPONIN I

## 2015-10-05 LAB — BRAIN NATRIURETIC PEPTIDE: B Natriuretic Peptide: 138 pg/mL — ABNORMAL HIGH (ref 0.0–100.0)

## 2015-10-05 MED ORDER — AZITHROMYCIN 500 MG PO TABS
500.0000 mg | ORAL_TABLET | Freq: Once | ORAL | Status: AC
  Administered 2015-10-05: 500 mg via ORAL
  Filled 2015-10-05: qty 1

## 2015-10-05 NOTE — ED Notes (Signed)
Pt informed of need for urine sample; pt denies needing to void at this time and states he will call for staff assistance when ready.

## 2015-10-05 NOTE — H&P (Signed)
[facility] [place] [facility]   PATIENT NAME: [name] [name]    MR#:  [id number]  DATE OF BIRTH:  12-13-1921  DATE OF ADMISSION:  10/05/2015  PRIMARY CARE PHYSICIAN: [name], MD   REQUESTING/REFERRING PHYSICIAN: Don Perking, MD  CHIEF COMPLAINT:   Chief Complaint  Patient presents with  . Shaking    HISTORY OF PRESENT ILLNESS:  [name] [name]  is a 82 y.o. male who presents with Likely seizure episode. He had a witnessed episode of shaking and unresponsiveness followed by what seems to be. Confusion likely corresponding to a postictal state. He had some vomiting during the episode. Patient has no prior history of seizure disorder or prior seizures. He was recently worked up here in this hospital for TIA, which workup included MRI and EEG. MRI at that time did not show stroke, also did not show significant structural abnormality. EEG at that time showed some slowing but no focal seizure activity. Hospitals were called for admission after this new episode tonight.  PAST MEDICAL HISTORY:   Past Medical History:  Diagnosis Date  . A-fib (HCC)   . Bradycardia    06/2010  . CAD (coronary artery disease)    nuclear 01/2008, no ischemia, EF 60%, distal anterior scar  . Carotid artery disease (HCC)    doppler 05/2009,  0-39% bilateral / doppler 06/2010 OK  . Edema    Ankle edema August, 2012  . Ejection fraction    EF 45%, echo,05/2009,apical hypo,  ?? posterior and lateral hypo  . Elevated CPK    11/23/08.Marland KitchenMarland Kitchenprobably from debris from popliteal aneurysm  . Fall at home July 2015  . Hx of CABG    1998  . Hyperlipidemia   . Intracranial hemorrhage (HCC)    Two small subependymal bleeds 06/2010,,ASA held  . PAD (peripheral artery disease) (HCC)    Above the knee to below the knee popliteal bypass,, right  //    Doppler,  July, 2013, patent  . Popliteal aneurysm (HCC)    .repair..11/25/2008  Dr. Darrick Penna  . Prominent abdominal aortic pulsation    doppler normal   . Speech problem    Neurologic event with brief infusion and speech difficulty.... January, 2011.... being assessed by neurology  February, 2011  . Syncope    .remote..no etiology  /  syncope 11/2008.. from acute leg pain  . TIA (transient ischemic attack)    Brief episode of confusion and speech difficulty, January, 2012  /  recurrent confusion speech difficulty September, 2012  . Venous insufficiency    support hose    PAST SURGICAL HISTORY:   Past Surgical History:  Procedure Laterality Date  . CORONARY ARTERY BYPASS GRAFT  1998  . popliteal bypass  with reversed greater saphenous vein from right leg.      SOCIAL HISTORY:   Social History  Substance Use Topics  . Smoking status: Former Smoker    Years: 20.00    Types: Cigarettes    Quit date: 09/12/1946  . Smokeless tobacco: Never Used     Comment: quit appox 50 yrs ago  . Alcohol use No    FAMILY HISTORY:   Family History  Problem Relation Age of Onset  . Stroke Mother   . Diabetes Mother   . Diabetes Sister     DRUG ALLERGIES:   Allergies  Allergen Reactions  . Penicillins Hives    Has patient had a PCN reaction causing immediate rash, facial/tongue/throat swelling, SOB or lightheadedness with hypotension: Yes  Has patient had a PCN reaction causing severe rash involving mucus membranes or skin necrosis: no Has patient had a PCN reaction that required hospitalization No Has patient had a PCN reaction occurring within the last 10 years: No If all of the above answers are "NO", then may proceed with Cephalosporin use.  Pt unsure of reaction,   . Latex Rash  . Sulfonamide Derivatives Rash    MEDICATIONS AT HOME:   Prior to Admission medications   Medication Sig Start Date End Date Taking? Authorizing Provider  acetaminophen (TYLENOL) 325 MG tablet Take 2 tablets (650 mg total) by mouth every 6 (six) hours as needed for mild pain (or Fever >/= 101). 08/22/15   Auburn Bilberry, MD  amiodarone (PACERONE) 200  MG tablet Take 1 tablet (200 mg total) by mouth daily. 08/22/15   Auburn Bilberry, MD  apixaban (ELIQUIS) 5 MG TABS tablet Take 1 tablet (5 mg total) by mouth 2 (two) times daily. 08/22/15   Auburn Bilberry, MD  aspirin EC 81 MG tablet Take 81 mg by mouth daily.    Historical Provider, MD  benzonatate (TESSALON) 100 MG capsule Take 1 capsule (100 mg total) by mouth every 8 (eight) hours as needed for cough. 08/22/15   Auburn Bilberry, MD  calcium carbonate (TUMS - DOSED IN MG ELEMENTAL CALCIUM) 500 MG chewable tablet Chew 2 tablets by mouth daily.    Historical Provider, MD  cefUROXime (CEFTIN) 500 MG tablet Take 1 tablet (500 mg total) by mouth 2 (two) times daily with a meal. 08/22/15   Auburn Bilberry, MD  docusate sodium (COLACE) 100 MG capsule Take 100 mg by mouth 2 (two) times daily as needed for mild constipation.    Historical Provider, MD  fluticasone (FLONASE) 50 MCG/ACT nasal spray Place 1 spray into both nostrils daily. 08/22/15   Auburn Bilberry, MD  guaiFENesin (MUCINEX) 600 MG 12 hr tablet Take 1 tablet (600 mg total) by mouth 2 (two) times daily. 08/22/15   Auburn Bilberry, MD  guaiFENesin (ROBITUSSIN) 100 MG/5ML SOLN Take 5 mLs (100 mg total) by mouth 2 (two) times daily as needed for cough or to loosen phlegm. 08/22/15   Auburn Bilberry, MD  ipratropium-albuterol (DUONEB) 0.5-2.5 (3) MG/3ML SOLN Inhale 3 mLs into the lungs every 6 (six) hours. 10/02/15   Historical Provider, MD  Loratadine (CLARITIN REDITABS) 5 MG TBDP Take by mouth 2 (two) times daily as needed.    Historical Provider, MD  Multiple Vitamin (MULTIVITAMIN WITH MINERALS) TABS tablet Take 1 tablet by mouth daily.    Historical Provider, MD  spironolactone (ALDACTONE) 25 MG tablet Take 0.5 tablets by mouth every morning. 10/04/15   Historical Provider, MD    REVIEW OF SYSTEMS:  Review of Systems  Constitutional: Negative for chills, fever, malaise/fatigue and weight loss.  HENT: Negative for ear pain, hearing loss and tinnitus.    Eyes: Negative for blurred vision, double vision, pain and redness.  Respiratory: Positive for cough and sputum production. Negative for hemoptysis and shortness of breath.   Cardiovascular: Negative for chest pain, palpitations, orthopnea and leg swelling.  Gastrointestinal: Negative for abdominal pain, constipation, diarrhea, nausea and vomiting.  Genitourinary: Negative for dysuria, frequency and hematuria.  Musculoskeletal: Negative for back pain, joint pain and neck pain.  Skin:       No acne, rash, or lesions  Neurological: Positive for seizures. Negative for dizziness, tremors, focal weakness and weakness.  Endo/Heme/Allergies: Negative for polydipsia. Does not bruise/bleed easily.  Psychiatric/Behavioral: Negative for depression. The  patient is not nervous/anxious and does not have insomnia.      VITAL SIGNS:   Vitals:   10/05/15 2107 10/05/15 2110 10/05/15 2204 10/05/15 2245  BP: 135/85  130/69 127/68  Pulse: 75  72 72  Resp: 20  20 (!) 21  Temp: 99.1 F (37.3 C)     TempSrc: Oral     SpO2: 95% 95% 95% 93%  Weight: 92.5 kg (204 lb)     Height: 5\' 11"  (1.803 m)      Wt Readings from Last 3 Encounters:  10/05/15 92.5 kg (204 lb)  10/02/15 89.1 kg (196 lb 8 oz)  08/17/15 94 kg (207 lb 4 oz)    PHYSICAL EXAMINATION:  Physical Exam  Vitals reviewed. Constitutional: He is oriented to person, place, and time. He appears well-developed and well-nourished. No distress.  HENT:  Head: Normocephalic and atraumatic.  Mouth/Throat: Oropharynx is clear and moist.  Eyes: Conjunctivae and EOM are normal. Pupils are equal, round, and reactive to light. No scleral icterus.  Neck: Normal range of motion. Neck supple. No JVD present. No thyromegaly present.  Cardiovascular: Normal rate, regular rhythm and intact distal pulses.  Exam reveals no gallop and no friction rub.   No murmur heard. Respiratory: Effort normal. No respiratory distress. He has no wheezes. He has no rales.   Right middle and lower lobe coarse breath sounds  GI: Soft. Bowel sounds are normal. He exhibits no distension. There is no tenderness.  Musculoskeletal: Normal range of motion. He exhibits no edema.  No arthritis, no gout  Lymphadenopathy:    He has no cervical adenopathy.  Neurological: He is alert and oriented to person, place, and time. No cranial nerve deficit.  No dysarthria, no aphasia  Skin: Skin is warm and dry. No rash noted. No erythema.  Psychiatric: He has a normal mood and affect. His behavior is normal. Judgment and thought content normal.    LABORATORY PANEL:   CBC  Recent Labs Lab 10/05/15 2129  WBC 9.0  HGB 14.6  HCT 42.0  PLT 167   ------------------------------------------------------------------------------------------------------------------  Chemistries   Recent Labs Lab 10/05/15 2129  NA 132*  K 4.4  CL 95*  CO2 27  GLUCOSE 107*  BUN 23*  CREATININE 0.78  CALCIUM 9.3   ------------------------------------------------------------------------------------------------------------------  Cardiac Enzymes  Recent Labs Lab 10/05/15 2129  TROPONINI <0.03   ------------------------------------------------------------------------------------------------------------------  RADIOLOGY:  Dg Chest 2 View  Result Date: 10/05/2015 CLINICAL DATA:  82 year old male with seizure like activity, coughing. Recently discharged from physical rehab. Initial encounter. EXAM: CHEST  2 VIEW COMPARISON:  08/21/2015 and earlier. FINDINGS: Stable cardiomegaly and mediastinal contours. Sequelae of CABG. Chronically low lung volumes. Chronic mild elevation of the left hemidiaphragm. No pneumothorax or pulmonary edema. No pleural effusion or confluent pulmonary opacity. Osteopenia. Stable visualized osseous structures. Chronic appearing lower thoracic or upper lumbar compression fracture. IMPRESSION: Chronic low lung volumes.  No acute cardiopulmonary abnormality.  Electronically Signed   By: Odessa Fleming M.D.   On: 10/05/2015 21:55   Ct Head Wo Contrast  Result Date: 10/05/2015 CLINICAL DATA:  New onset seizure.  On Eliquis. EXAM: CT HEAD WITHOUT CONTRAST TECHNIQUE: Contiguous axial images were obtained from the base of the skull through the vertex without intravenous contrast. COMPARISON:  Head CT 08/16/2015, brain MRI 08/17/2015 FINDINGS: Brain: No intracranial hemorrhage, mass effect, or midline shift. There is stable ventriculomegaly from prior exam. Stable chronic small vessel ischemia. The basilar cisterns are patent. No evidence of territorial infarct.  No intracranial fluid collection. Vascular: No hyperdense vessel or abnormal calcification. Extensive atherosclerotic calcifications of skullbase vasculature. Skull: Negative for fracture or focal lesion. Calvarium is intact. Sinuses/Orbits: Included paranasal sinuses and mastoid air cells are well aerated. Other: None. IMPRESSION: Stable ventriculomegaly and chronic small vessel ischemia without CT findings of acute intracranial abnormality. Electronically Signed   By: Rubye Oaks M.D.   On: 10/05/2015 22:06    EKG:   Orders placed or performed during the hospital encounter of 10/05/15  . EKG 12-Lead  . EKG 12-Lead    IMPRESSION AND PLAN:  Principal Problem:   Seizure [facility]) - Patient has no prior history of seizure, but had an episode tonight witnessed by a healthcare professional as well as his daughter which seems very much like it was a seizure. He had a recent neurological workup for TIA including MRI which did not show any significant structural abnormalities. We'll admit him tonight, give him a dose of Keppra, and get a neurology consult in the morning. Active Problems:   Acute bronchitis - patient was taking his azithromycin for this, we will changes to Rocephin. X-ray is not suggestive of pneumonia, but the patient on physical exam has coarse breath sounds. He is on amiodarone and there is some  concern that his azithromycin could potentially prolong his QT interval.   CAD (coronary artery disease) - continue home meds   Chronic systolic (congestive) heart failure (HCC) - continue home meds   A-fib (HCC) - continue rhythm and rate controlling medications as well as anticoagulation for now.  All the records are reviewed and case discussed with ED provider. Management plans discussed with the patient and/or family.  DVT PROPHYLAXIS: Systemic anticoagulation  GI PROPHYLAXIS: None  ADMISSION STATUS: Inpatient  CODE STATUS: Full Code Status History    Date Active Date Inactive Code Status Order ID Comments User Context   08/17/2015  1:14 AM 08/17/2015  1:14 AM Full Code [id number]  Joella Prince, MD ED   08/17/2015  1:14 AM 08/22/2015  6:03 PM Full Code [id number]  Joella Prince, MD ED    Advance Directive Documentation   Flowsheet Row Most Recent Value  Type of Advance Directive  Healthcare Power of Attorney, Living will  Pre-existing out of facility DNR order (yellow form or pink MOST form)  No data  "MOST" Form in Place?  No data      TOTAL TIME TAKING CARE OF THIS PATIENT: 45 minutes.    Adalene Gulotta FIELDING 10/05/2015, 10:52 PM  [facility]  Office  [phone]  CC: Primary care physician; [name], MD

## 2015-10-05 NOTE — ED Triage Notes (Signed)
Pt arrives via [facility] from home d/t reported seizure-like activity per pt's daughter. EMS reports pt stated it was a coughing spell, and didn't want to come to the ED for evaluation, but the daughter insisted. Pt has no h/x of seizures in the past, and was d/c'd yesterday from rehab after treatment for immobility. EMS reports pt's VSS en route; pt arrives in NAD, respirations even and regular, but with a congested and productive cough.

## 2015-10-05 NOTE — ED Provider Notes (Signed)
[facility] Emergency Department Provider Note  ____________________________________________  Time seen: Approximately 9:38 PM  I have reviewed the triage vital signs and the nursing notes.   HISTORY  Chief Complaint Shaking   HPI [name] [name] is a 82 y.o. male history of intracranial hemorrhage, peripheral arterial disease, coronary artery disease s/p CABG, TIA, afib on Eliquis who presents from home for concern of possible seizure. Patient was discharged from rehabilitation yesterday and went back home. He had been admitted here for concerns of stroke as patient was having trouble with eye tracking. He was discharged on 6/20 to rehabilitation and he was there until yesterday. His daughter was putting him in bed with a caretaker when patient had an episode of generalized tonic-clonic seizure. She describes the patient's eyes rolled back, he was shaking, his jaw was clenched, and patient was not answering. This episode lasted about a minute. She reports the patient was confused afterwards for about 5 minutes. EMS was called and patient was brought in for evaluation. Patient has no recollection of this event tonight. No tongue trauma, no urinary or bowel loss. Patient has no prior history of stroke. During this recent hospitalization patient had a normal MRI and EEG.  Past Medical History:  Diagnosis Date  . A-fib (HCC)   . Bradycardia    06/2010  . CAD (coronary artery disease)    nuclear 01/2008, no ischemia, EF 60%, distal anterior scar  . Carotid artery disease (HCC)    doppler 05/2009,  0-39% bilateral / doppler 06/2010 OK  . Edema    Ankle edema August, 2012  . Ejection fraction    EF 45%, echo,05/2009,apical hypo,  ?? posterior and lateral hypo  . Elevated CPK    11/23/08.Marland KitchenMarland Kitchenprobably from debris from popliteal aneurysm  . Fall at home July 2015  . Hx of CABG    1998  . Hyperlipidemia   . Intracranial hemorrhage (HCC)    Two small subependymal bleeds  06/2010,,ASA held  . PAD (peripheral artery disease) (HCC)    Above the knee to below the knee popliteal bypass,, right  //    Doppler,  July, 2013, patent  . Popliteal aneurysm (HCC)    .repair..11/25/2008  Dr. Darrick Penna  . Prominent abdominal aortic pulsation    doppler normal  . Speech problem    Neurologic event with brief infusion and speech difficulty.... January, 2011.... being assessed by neurology  February, 2011  . Syncope    .remote..no etiology  /  syncope 11/2008.. from acute leg pain  . TIA (transient ischemic attack)    Brief episode of confusion and speech difficulty, January, 2012  /  recurrent confusion speech difficulty September, 2012  . Venous insufficiency    support hose    Patient Active Problem List   Diagnosis Date Noted  . Seizure (HCC) 10/05/2015  . A-fib (HCC) 10/05/2015  . Chronic systolic (congestive) heart failure (HCC) 09/07/2015  . Altered mental status 08/18/2015  . CAD in native artery 08/07/2015  . Arterial disease (HCC) 08/07/2015  . HLD (hyperlipidemia) 08/07/2015  . Other specified symptoms and signs involving the circulatory and respiratory systems 08/07/2015  . Speech disorder 08/07/2015  . Temporary cerebral vascular dysfunction 08/07/2015  . Chronic venous insufficiency 08/07/2015  . Pneumonia 02/09/2015  . PNA (pneumonia) 02/09/2015  . H/O neoplasm 08/22/2014  . H/O malignant neoplasm of skin 08/22/2014  . 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 Peripheral vascular disease (HCC) 02/28/2014  . Postsurgical aortocoronary bypass status  02/28/2014  . Syncope and collapse 02/28/2014  . 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 Discoloration of skin of foot-Left 10/13/2013  . 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 Disorder of pigmentation 10/13/2013  . PAD (peripheral artery disease) (HCC)   . TIA (transient ischemic attack)   . Edema   . Hyperlipidemia   . Syncope   . CAD (coronary artery  disease)   . Venous insufficiency   . Carotid artery disease (HCC)   . Elevated CPK   . Popliteal aneurysm (HCC)   . Speech problem   . Hx of CABG   . Ejection fraction   . Prominent abdominal aortic pulsation   . Intracranial hemorrhage (HCC)   . Bradycardia     Past Surgical History:  Procedure Laterality Date  . CORONARY ARTERY BYPASS GRAFT  1998  . popliteal bypass  with reversed greater saphenous vein from right leg.      Prior to Admission medications   Medication Sig Start Date End Date Taking? Authorizing Provider  acetaminophen (TYLENOL) 325 MG tablet Take 2 tablets (650 mg total) by mouth every 6 (six) hours as needed for mild pain (or Fever >/= 101). 08/22/15   Auburn Bilberry, MD  amiodarone (PACERONE) 200 MG tablet Take 1 tablet (200 mg total) by mouth daily. 08/22/15   Auburn Bilberry, MD  apixaban (ELIQUIS) 5 MG TABS tablet Take 1 tablet (5 mg total) by mouth 2 (two) times daily. 08/22/15   Auburn Bilberry, MD  aspirin EC 81 MG tablet Take 81 mg by mouth daily.    Historical Provider, MD  benzonatate (TESSALON) 100 MG capsule Take 1 capsule (100 mg total) by mouth every 8 (eight) hours as needed for cough. 08/22/15   Auburn Bilberry, MD  calcium carbonate (TUMS - DOSED IN MG ELEMENTAL CALCIUM) 500 MG chewable tablet Chew 2 tablets by mouth daily.    Historical Provider, MD  cefUROXime (CEFTIN) 500 MG tablet Take 1 tablet (500 mg total) by mouth 2 (two) times daily with a meal. 08/22/15   Auburn Bilberry, MD  docusate sodium (COLACE) 100 MG capsule Take 100 mg by mouth 2 (two) times daily as needed for mild constipation.    Historical Provider, MD  fluticasone (FLONASE) 50 MCG/ACT nasal spray Place 1 spray into both nostrils daily. 08/22/15   Auburn Bilberry, MD  guaiFENesin (MUCINEX) 600 MG 12 hr tablet Take 1 tablet (600 mg total) by mouth 2 (two) times daily. 08/22/15   Auburn Bilberry, MD  guaiFENesin (ROBITUSSIN) 100 MG/5ML SOLN Take 5 mLs (100 mg total) by mouth 2 (two) times  daily as needed for cough or to loosen phlegm. 08/22/15   Auburn Bilberry, MD  Loratadine (CLARITIN REDITABS) 5 MG TBDP Take by mouth 2 (two) times daily as needed.    Historical Provider, MD  Multiple Vitamin (MULTIVITAMIN WITH MINERALS) TABS tablet Take 1 tablet by mouth daily.    Historical Provider, MD    Allergies Penicillins; Latex; and Sulfonamide derivatives  Family History  Problem Relation Age of Onset  . Stroke Mother   . Diabetes Mother   . Diabetes Sister     Social History Social History  Substance Use Topics  . Smoking status: Former Smoker    Years: 20.00    Types: Cigarettes    Quit date: 09/12/1946  . Smokeless tobacco: Never Used     Comment: quit appox 50 yrs ago  . Alcohol use No    Review of Systems Constitutional:  Negative for fever. Eyes: Negative for visual changes. ENT: Negative for sore throat. Cardiovascular: Negative for chest pain. Respiratory: Negative for shortness of breath. Gastrointestinal: Negative for abdominal pain, vomiting or diarrhea. Genitourinary: Negative for dysuria. Musculoskeletal: Negative for back pain. Skin: Negative for rash. Neurological: Negative for headaches, weakness or numbness. + seizure  ____________________________________________   PHYSICAL EXAM:  VITAL SIGNS: ED Triage Vitals  Enc Vitals Group     BP 10/05/15 2107 135/85     Pulse Rate 10/05/15 2107 75     Resp 10/05/15 2107 20     Temp 10/05/15 2107 99.1 F (37.3 C)     Temp Source 10/05/15 2107 Oral     SpO2 10/05/15 2107 95 %     Weight 10/05/15 2107 204 lb (92.5 kg)     Height 10/05/15 2107 5\' 11"  (1.803 m)     Head Circumference --      Peak Flow --      Pain Score 10/05/15 2110 0     Pain Loc --      Pain Edu? --      Excl. in GC? --     Constitutional: Alert and oriented. Well appearing and in no apparent distress. HEENT:      Head: Normocephalic and atraumatic.         Eyes: Conjunctivae are normal. Sclera is non-icteric. EOMI.  PERRL      Mouth/Throat: Mucous membranes are moist.       Neck: Supple with no signs of meningismus. Cardiovascular: Regular rate and rhythm. No murmurs, gallops, or rubs. 2+ symmetrical distal pulses are present in all extremities. No JVD. Respiratory: Normal respiratory effort. Lungs are clear to auscultation bilaterally. Coarse whonchi. No wheezes, crackles.  Gastrointestinal: Soft, non tender, and non distended with positive bowel sounds. No rebound or guarding. Genitourinary: No CVA tenderness. Musculoskeletal: Nontender with normal range of motion in all extremities. No edema, cyanosis, or erythema of extremities. Neurologic: A & O x3, PERRL, no nystagmus, CN II-XII intact, motor testing reveals good tone and bulk throughout. There is no evidence of pronator drift or dysmetria. Muscle strength is 5/5 throughout. Deep tendon reflexes are 2+ throughout with downgoing toes. Sensory examination is intact. Gait deferred.Normal speech and language.  Skin: Skin is warm, dry and intact. No rash noted. Psychiatric: Mood and affect are normal. Speech and behavior are normal.  ____________________________________________   LABS (all labs ordered are listed, but only abnormal results are displayed)  Labs Reviewed  CBC WITH DIFFERENTIAL/PLATELET - Abnormal; Notable for the following:       Result Value   RDW 14.6 (*)    Neutro Abs 7.7 (*)    Lymphs Abs 0.6 (*)    All other components within normal limits  BASIC METABOLIC PANEL - Abnormal; Notable for the following:    Sodium 132 (*)    Chloride 95 (*)    Glucose, Bld 107 (*)    BUN 23 (*)    All other components within normal limits  URINE CULTURE  TROPONIN I  URINALYSIS COMPLETEWITH MICROSCOPIC ([facility] ONLY)  BRAIN NATRIURETIC PEPTIDE   ____________________________________________  EKG  ED ECG REPORT I, Nita Sickle, the attending physician, personally viewed and interpreted this ECG.  Sinus rhythm, rate of 77, normal  intervals, normal axis, no ST elevations or depressions. ____________________________________________  RADIOLOGY  Head CT: negative CXR: negative  ____________________________________________   PROCEDURES  Procedure(s) performed: None Procedures Critical Care performed:  None ____________________________________________   INITIAL IMPRESSION / ASSESSMENT  AND PLAN / ED COURSE   83 y.o. male history of intracranial hemorrhage, peripheral arterial disease, coronary artery disease s/p CABG, TIA, afib on Eliquis who presents from home for concern of possible seizure. Patient is neurologically intact. Electrolytes and head CT negative. He also has coarse rhonchi and this productive cough for over a week now. We'll start patient on a Z-Pak for chronic bronchitis as chest x-ray is negative and his white count is normal. We'll admit to the hospitalist for further evaluation for new-onset of seizures.  Clinical Course    Pertinent labs & imaging results that were available during my care of the patient were reviewed by me and considered in my medical decision making (see chart for details).    ____________________________________________   FINAL CLINICAL IMPRESSION(S) / ED DIAGNOSES  Final diagnoses:  Seizure (HCC)  Bronchitis      NEW MEDICATIONS STARTED DURING THIS VISIT:  New Prescriptions   No medications on file     Note:  This document was prepared using Dragon voice recognition software and may include unintentional dictation errors.    Nita Sickle, MD 10/05/15 2239

## 2015-10-05 NOTE — ED Notes (Signed)
Report called to floor at 2335. Informed that the bed assignment was approved by not ready/clean. Gavin Pound, RN told this RN that she would call back on my [facility] phone when ready for the patient to be brought to the floor.

## 2015-10-06 ENCOUNTER — Inpatient Hospital Stay (HOSPITAL_COMMUNITY): Payer: Medicare Other

## 2015-10-06 DIAGNOSIS — R569 Unspecified convulsions: Secondary | ICD-10-CM

## 2015-10-06 DIAGNOSIS — J209 Acute bronchitis, unspecified: Secondary | ICD-10-CM | POA: Diagnosis present

## 2015-10-06 LAB — BASIC METABOLIC PANEL
Anion gap: 6 (ref 5–15)
BUN: 17 mg/dL (ref 6–20)
CHLORIDE: 99 mmol/L — AB (ref 101–111)
CO2: 29 mmol/L (ref 22–32)
CREATININE: 0.58 mg/dL — AB (ref 0.61–1.24)
Calcium: 8.7 mg/dL — ABNORMAL LOW (ref 8.9–10.3)
GFR calc Af Amer: >60 mL/min (ref >60)
GFR calc non Af Amer: >60 mL/min (ref >60)
GLUCOSE: 112 mg/dL — AB (ref 65–99)
POTASSIUM: 3.9 mmol/L (ref 3.5–5.1)
SODIUM: 134 mmol/L — AB (ref 135–145)

## 2015-10-06 LAB — CBC
HCT: 37.9 % — ABNORMAL LOW (ref 40.0–52.0)
Hemoglobin: 13.5 g/dL (ref 13.0–18.0)
MCH: 32.1 pg (ref 26.0–34.0)
MCHC: 35.6 g/dL (ref 32.0–36.0)
MCV: 90.2 fL (ref 80.0–100.0)
PLATELETS: 152 10*3/uL (ref 150–440)
RBC: 4.21 MIL/uL — ABNORMAL LOW (ref 4.40–5.90)
RDW: 14.3 % (ref 11.5–14.5)
WBC: 7 10*3/uL (ref 3.8–10.6)

## 2015-10-06 MED ORDER — LEVETIRACETAM 500 MG PO TABS
500.0000 mg | ORAL_TABLET | Freq: Two times a day (BID) | ORAL | Status: DC
  Administered 2015-10-06 – 2015-10-08 (×5): 500 mg via ORAL
  Filled 2015-10-06 (×5): qty 1

## 2015-10-06 MED ORDER — APIXABAN 5 MG PO TABS
5.0000 mg | ORAL_TABLET | Freq: Two times a day (BID) | ORAL | Status: DC
  Administered 2015-10-06 – 2015-10-08 (×5): 5 mg via ORAL
  Filled 2015-10-06 (×5): qty 1

## 2015-10-06 MED ORDER — SODIUM CHLORIDE 0.9 % IV SOLN
INTRAVENOUS | Status: AC
  Administered 2015-10-06: 01:00:00 via INTRAVENOUS

## 2015-10-06 MED ORDER — GUAIFENESIN 100 MG/5ML PO SOLN
10.0000 mL | ORAL | Status: DC | PRN
  Filled 2015-10-06: qty 10

## 2015-10-06 MED ORDER — ONDANSETRON HCL 4 MG PO TABS: 4.0000 mg | ORAL_TABLET | Freq: Four times a day (QID) | ORAL | Status: DC | PRN

## 2015-10-06 MED ORDER — SODIUM CHLORIDE 0.9 % IV SOLN
1000.0000 mg | Freq: Once | INTRAVENOUS | Status: AC
  Administered 2015-10-06: 1000 mg via INTRAVENOUS
  Filled 2015-10-06: qty 10

## 2015-10-06 MED ORDER — DEXTROSE 5 % IV SOLN
2.0000 g | Freq: Once | INTRAVENOUS | Status: AC
  Administered 2015-10-06: 2 g via INTRAVENOUS
  Filled 2015-10-06: qty 2

## 2015-10-06 MED ORDER — DEXTROSE 5 % IV SOLN
2.0000 g | INTRAVENOUS | Status: DC
  Administered 2015-10-06 – 2015-10-07 (×2): 2 g via INTRAVENOUS
  Filled 2015-10-06 (×4): qty 2

## 2015-10-06 MED ORDER — BENZONATATE 100 MG PO CAPS
100.0000 mg | ORAL_CAPSULE | Freq: Three times a day (TID) | ORAL | Status: DC | PRN
  Administered 2015-10-07 – 2015-10-08 (×2): 100 mg via ORAL
  Filled 2015-10-06 (×3): qty 1

## 2015-10-06 MED ORDER — DOCUSATE SODIUM 100 MG PO CAPS: 100.0000 mg | ORAL_CAPSULE | Freq: Two times a day (BID) | ORAL | Status: DC | PRN

## 2015-10-06 MED ORDER — GUAIFENESIN 200 MG PO TABS
200.0000 mg | ORAL_TABLET | ORAL | Status: DC | PRN
Start: 2015-10-06 — End: 2015-10-06
  Filled 2015-10-06: qty 1

## 2015-10-06 MED ORDER — GUAIFENESIN-DM 100-10 MG/5ML PO SYRP: 10.0000 mL | ORAL_SOLUTION | ORAL | Status: DC | PRN

## 2015-10-06 MED ORDER — ACETAMINOPHEN 650 MG RE SUPP: 650.0000 mg | Freq: Four times a day (QID) | RECTAL | Status: DC | PRN

## 2015-10-06 MED ORDER — IPRATROPIUM-ALBUTEROL 0.5-2.5 (3) MG/3ML IN SOLN
3.0000 mL | Freq: Four times a day (QID) | RESPIRATORY_TRACT | Status: DC
  Administered 2015-10-06 – 2015-10-07 (×6): 3 mL via RESPIRATORY_TRACT
  Filled 2015-10-06 (×6): qty 3

## 2015-10-06 MED ORDER — AMIODARONE HCL 200 MG PO TABS
200.0000 mg | ORAL_TABLET | Freq: Every day | ORAL | Status: DC
  Administered 2015-10-06 – 2015-10-08 (×3): 200 mg via ORAL
  Filled 2015-10-06 (×3): qty 1

## 2015-10-06 MED ORDER — ASPIRIN EC 81 MG PO TBEC
81.0000 mg | DELAYED_RELEASE_TABLET | Freq: Every day | ORAL | Status: DC
  Administered 2015-10-06 – 2015-10-08 (×3): 81 mg via ORAL
  Filled 2015-10-06 (×3): qty 1

## 2015-10-06 MED ORDER — ONDANSETRON HCL 4 MG/2ML IJ SOLN: 4.0000 mg | Freq: Four times a day (QID) | INTRAMUSCULAR | Status: DC | PRN

## 2015-10-06 MED ORDER — SODIUM CHLORIDE 0.9% FLUSH
3.0000 mL | Freq: Two times a day (BID) | INTRAVENOUS | Status: DC
  Administered 2015-10-06 – 2015-10-08 (×5): 3 mL via INTRAVENOUS

## 2015-10-06 MED ORDER — ACETAMINOPHEN 325 MG PO TABS: 650.0000 mg | ORAL_TABLET | Freq: Four times a day (QID) | ORAL | Status: DC | PRN

## 2015-10-06 NOTE — Progress Notes (Signed)
Pharmacy Antibiotic Note  [name] [name] is a 82 y.o. male admitted on 10/05/2015 with pneumonia.  Pharmacy has been consulted for ceftriaxone dosing.  Plan: Ceftriaxone 2 gm IV Q24H  Height: 5\' 11"  (180.3 cm) Weight: 204 lb (92.5 kg) IBW/kg (Calculated) : 75.3  Temp (24hrs), Avg:98.7 F (37.1 C), Min:98.2 F (36.8 C), Max:99.1 F (37.3 C)   Recent Labs Lab 10/05/15 2129  WBC 9.0  CREATININE 0.78    Estimated Creatinine Clearance: 69.9 mL/min (by C-G formula based on SCr of 0.8 mg/dL).    Allergies  Allergen Reactions  . Penicillins Hives    Has patient had a PCN reaction causing immediate rash, facial/tongue/throat swelling, SOB or lightheadedness with hypotension: Yes Has patient had a PCN reaction causing severe rash involving mucus membranes or skin necrosis: no Has patient had a PCN reaction that required hospitalization No Has patient had a PCN reaction occurring within the last 10 years: No If all of the above answers are "NO", then may proceed with Cephalosporin use.  Pt unsure of reaction,   . Latex Rash  . Sulfonamide Derivatives Rash    Thank you for allowing pharmacy to be a part of this patient's care.  Carola Frost, Pharm.D., BCPS Clinical Pharmacist 10/06/2015 1:04 AM

## 2015-10-06 NOTE — Care Management Important Message (Signed)
Important Message  Patient Details  Name: [name] [name] MRN: [id number] Date of Birth: 05-22-1921   Medicare Important Message Given:  Yes    Adonis Huguenin, RN 10/06/2015, 10:46 AM

## 2015-10-06 NOTE — Progress Notes (Signed)
[facility] - [place] at [facility], [place]   PATIENT NAME: [name] [name]    MR#:  [id number]  DATE OF BIRTH:  05/28/1921  SUBJECTIVE:  CHIEF COMPLAINT:  Patient is resting comfortably. Answering all questions appropriately. No other episodes of seizures  REVIEW OF SYSTEMS:  CONSTITUTIONAL: No fever, fatigue or weakness.  EYES: No blurred or double vision.  EARS, NOSE, AND THROAT: No tinnitus or ear pain.  RESPIRATORY: No cough, shortness of breath, wheezing or hemoptysis.  CARDIOVASCULAR: No chest pain, orthopnea, edema.  GASTROINTESTINAL: No nausea, vomiting, diarrhea or abdominal pain.  GENITOURINARY: No dysuria, hematuria.  ENDOCRINE: No polyuria, nocturia,  HEMATOLOGY: No anemia, easy bruising or bleeding SKIN: No rash or lesion. MUSCULOSKELETAL: No joint pain or arthritis.   NEUROLOGIC: No tingling, numbness, weakness.  PSYCHIATRY: No anxiety or depression.   DRUG ALLERGIES:   Allergies  Allergen Reactions  . Penicillins Hives    Has patient had a PCN reaction causing immediate rash, facial/tongue/throat swelling, SOB or lightheadedness with hypotension: Yes Has patient had a PCN reaction causing severe rash involving mucus membranes or skin necrosis: no Has patient had a PCN reaction that required hospitalization No Has patient had a PCN reaction occurring within the last 10 years: No If all of the above answers are "NO", then may proceed with Cephalosporin use.  Pt unsure of reaction,   . Latex Rash  . Sulfonamide Derivatives Rash    VITALS:  Blood pressure (!) 123/58, pulse 62, temperature 97.5 F (36.4 C), temperature source Oral, resp. rate 20, height  (1.803 m), weight 87.6 kg (193 lb 1.7 oz), SpO2 99 %.  PHYSICAL EXAMINATION:  GENERAL:  82 y.o.-year-old patient lying in the bed with no acute distress.  EYES: Pupils equal, round, reactive to light and accommodation. No scleral icterus. Extraocular muscles intact.  HEENT: Head  atraumatic, normocephalic. Oropharynx and nasopharynx clear.  NECK:  Supple, no jugular venous distention. No thyroid enlargement, no tenderness.  LUNGS: Normal breath sounds bilaterally, no wheezing, rales,rhonchi or crepitation. No use of accessory muscles of respiration.  CARDIOVASCULAR: S1, S2 normal. No murmurs, rubs, or gallops.  ABDOMEN: Soft, nontender, nondistended. Bowel sounds present. No organomegaly or mass.  EXTREMITIES: No pedal edema, cyanosis, or clubbing.  NEUROLOGIC: Cranial nerves II through XII are intact. Muscle strength 5/5 in all extremities. Sensation intact. Gait not checked.  PSYCHIATRIC: The patient is alert and oriented x 3.  SKIN: No obvious rash, lesion, or ulcer.    LABORATORY PANEL:   CBC  Recent Labs Lab 10/06/15 0526  WBC 7.0  HGB 13.5  HCT 37.9*  PLT 152   ------------------------------------------------------------------------------------------------------------------  Chemistries   Recent Labs Lab 10/06/15 0526  NA 134*  K 3.9  CL 99*  CO2 29  GLUCOSE 112*  BUN 17  CREATININE 0.58*  CALCIUM 8.7*   ------------------------------------------------------------------------------------------------------------------  Cardiac Enzymes  Recent Labs Lab 10/05/15 2129  TROPONINI <0.03   ------------------------------------------------------------------------------------------------------------------  RADIOLOGY:  Dg Chest 2 View  Result Date: 10/05/2015 CLINICAL DATA:  82 year old male with seizure like activity, coughing. Recently discharged from physical rehab. Initial encounter. EXAM: CHEST  2 VIEW COMPARISON:  08/21/2015 and earlier. FINDINGS: Stable cardiomegaly and mediastinal contours. Sequelae of CABG. Chronically low lung volumes. Chronic mild elevation of the left hemidiaphragm. No pneumothorax or pulmonary edema. No pleural effusion or confluent pulmonary opacity. Osteopenia. Stable visualized osseous structures. Chronic  appearing lower thoracic or upper lumbar compression fracture. IMPRESSION: Chronic low lung volumes.  No acute cardiopulmonary abnormality. Electronically Signed  By: Odessa Fleming M.D.   On: 10/05/2015 21:55   Ct Head Wo Contrast  Result Date: 10/05/2015 CLINICAL DATA:  New onset seizure.  On Eliquis. EXAM: CT HEAD WITHOUT CONTRAST TECHNIQUE: Contiguous axial images were obtained from the base of the skull through the vertex without intravenous contrast. COMPARISON:  Head CT 08/16/2015, brain MRI 08/17/2015 FINDINGS: Brain: No intracranial hemorrhage, mass effect, or midline shift. There is stable ventriculomegaly from prior exam. Stable chronic small vessel ischemia. The basilar cisterns are patent. No evidence of territorial infarct. No intracranial fluid collection. Vascular: No hyperdense vessel or abnormal calcification. Extensive atherosclerotic calcifications of skullbase vasculature. Skull: Negative for fracture or focal lesion. Calvarium is intact. Sinuses/Orbits: Included paranasal sinuses and mastoid air cells are well aerated. Other: None. IMPRESSION: Stable ventriculomegaly and chronic small vessel ischemia without CT findings of acute intracranial abnormality. Electronically Signed   By: Rubye Oaks M.D.   On: 10/05/2015 22:06    EKG:   Orders placed or performed during the hospital encounter of 10/05/15  . EKG 12-Lead  . EKG 12-Lead  . EKG    ASSESSMENT AND PLAN:   # Seizure [facility]) - Patient has no prior history of seizure  seizure  witnessed by a healthcare professional as well as his daughter which seems very much like it was a seizure.  KeppraWill be continued by mouth every 12 hours He had a recent neurological workup for TIA including MRI which did not show any significant structural abnormalities.  EEG done- neg MRI is negative for any stroke     #Acute bronchitis - patient was taking his azithromycin for this, we will changes to Rocephin. DuoNeb's every 6  hours Guaifenesin  X-ray is not suggestive of pneumonia, but the patient on physical exam has coarse breath sounds. He is on amiodarone and there is some concern that his azithromycin could potentially prolong his QT interval.   # CAD (coronary artery disease) - continue home meds    #Chronic systolic (congestive) heart failure (HCC) - continue home meds    #A-fib (HCC) - continue amiodarone and eliquis     All the records are reviewed and case discussed with Care Management/Social Workerr. Management plans discussed with the patient, 2 daughters at bedside and another daughter  Over phone who is the healthcare power of attorney and they are in agreement.  CODE STATUS: fc  TOTAL TIME TAKING CARE OF THIS PATIENT: 36  minutes.   POSSIBLE D/C IN 2  DAYS, DEPENDING ON CLINICAL CONDITION.  Note: This dictation was prepared with Dragon dictation along with smaller phrase technology. Any transcriptional errors that result from this process are unintentional.   Ramonita Lab M.D on 10/06/2015 at 4:27 PM  Between 7am to 6pm - Pager - [phone] After 6pm go to [email or web address] - password [email or web address] [facility]  [facility]  Office  [phone]  CC: Primary care physician; [name], MD

## 2015-10-06 NOTE — Consult Note (Signed)
CC: seizure activity   HPI: [name] [name] is an 82 y.o. male  witnessed episode of shaking and unresponsiveness followed by what seems to be. Confusion likely corresponding to a postictal state. He had some vomiting during the episode. Patient has no prior history of seizure disorder or prior seizures. He was recently worked up here in this hospital for TIA, which workup included MRI and EEG. MRI at that time did not show stroke, also did not show significant structural abnormality.     Past Medical History:  Diagnosis Date  . A-fib (HCC)   . Bradycardia    06/2010  . CAD (coronary artery disease)    nuclear 01/2008, no ischemia, EF 60%, distal anterior scar  . Carotid artery disease (HCC)    doppler 05/2009,  0-39% bilateral / doppler 06/2010 OK  . Edema    Ankle edema August, 2012  . Ejection fraction    EF 45%, echo,05/2009,apical hypo,  ?? posterior and lateral hypo  . Elevated CPK    11/23/08.Marland KitchenMarland Kitchenprobably from debris from popliteal aneurysm  . Fall at home July 2015  . Hx of CABG    1998  . Hyperlipidemia   . Intracranial hemorrhage (HCC)    Two small subependymal bleeds 06/2010,,ASA held  . PAD (peripheral artery disease) (HCC)    Above the knee to below the knee popliteal bypass,, right  //    Doppler,  July, 2013, patent  . Popliteal aneurysm (HCC)    .repair..11/25/2008  Dr. Darrick Penna  . Prominent abdominal aortic pulsation    doppler normal  . Speech problem    Neurologic event with brief infusion and speech difficulty.... January, 2011.... being assessed by neurology  February, 2011  . Syncope    .remote..no etiology  /  syncope 11/2008.. from acute leg pain  . TIA (transient ischemic attack)    Brief episode of confusion and speech difficulty, January, 2012  /  recurrent confusion speech difficulty September, 2012  . Venous insufficiency    support hose    Past Surgical History:  Procedure Laterality Date  . CORONARY ARTERY BYPASS GRAFT  1998  . popliteal bypass  with  reversed greater saphenous vein from right leg.      Family History  Problem Relation Age of Onset  . Stroke Mother   . Diabetes Mother   . Diabetes Sister     Social History:  reports that he quit smoking about 69 years ago. His smoking use included Cigarettes. He quit after 20.00 years of use. He has never used smokeless tobacco. He reports that he does not drink alcohol or use drugs.  Allergies  Allergen Reactions  . Penicillins Hives    Has patient had a PCN reaction causing immediate rash, facial/tongue/throat swelling, SOB or lightheadedness with hypotension: Yes Has patient had a PCN reaction causing severe rash involving mucus membranes or skin necrosis: no Has patient had a PCN reaction that required hospitalization No Has patient had a PCN reaction occurring within the last 10 years: No If all of the above answers are "NO", then may proceed with Cephalosporin use.  Pt unsure of reaction,   . Latex Rash  . Sulfonamide Derivatives Rash    Medications: I have reviewed the patient's current medications.  ROS: Unable to obtain due to confusion  Physical Examination: Blood pressure 132/69, pulse (!) 58, temperature 98.1 F (36.7 C), temperature source Oral, resp. rate 18, height 5\' 11"  (1.803 m), weight 87.6 kg (193 lb 1.7 oz), SpO2  98 %.   Neurological Examination Mental Status: Alert to name only.  Cranial Nerves: III,IV, VI: ptosis not present, extra-ocular motions intact bilaterally V,VII: smile symmetric, facial light touch sensation normal bilaterally VIII: hearing normal bilaterally IX,X: gag reflex present XI: bilateral shoulder shrug XII: midline tongue extension Motor: Right : Upper extremity   4/5    Left:     Upper extremity   4/5  Lower extremity   4/5     Lower extremity   4/5 Tone and bulk:normal tone throughout; no atrophy noted Sensory: Pinprick and light touch intact throughout, bilaterally Deep Tendon Reflexes: 1+ and symmetric  throughout Plantars: Right: downgoing   Left: downgoing Cerebellar: normal finger-to-nose, normal rapid alternating movements and normal heel-to-shin test Gait: not tested      Laboratory Studies:   Basic Metabolic Panel:  Recent Labs Lab 10/05/15 2129 10/06/15 0526  NA 132* 134*  K 4.4 3.9  CL 95* 99*  CO2 27 29  GLUCOSE 107* 112*  BUN 23* 17  CREATININE 0.78 0.58*  CALCIUM 9.3 8.7*    Liver Function Tests: No results for input(s): AST, ALT, ALKPHOS, BILITOT, PROT, ALBUMIN in the last 168 hours. No results for input(s): LIPASE, AMYLASE in the last 168 hours. No results for input(s): AMMONIA in the last 168 hours.  CBC:  Recent Labs Lab 10/05/15 2129 10/06/15 0526  WBC 9.0 7.0  NEUTROABS 7.7*  --   HGB 14.6 13.5  HCT 42.0 37.9*  MCV 89.7 90.2  PLT 167 152    Cardiac Enzymes:  Recent Labs Lab 10/05/15 2129  TROPONINI <0.03    BNP: Invalid input(s): POCBNP  CBG: No results for input(s): GLUCAP in the last 168 hours.  Microbiology: Results for orders placed or performed during the hospital encounter of 06/26/10  Culture, blood (routine x 2)     Status: None   Collection Time: 06/26/10  7:01 PM  Result Value Ref Range Status   Specimen Description BLOOD ARM LEFT  Final   Special Requests BOTTLES DRAWN AEROBIC ONLY [facility]  Final   Culture  Setup Time [id number]  Final   Culture NO GROWTH 5 DAYS  Final   Report Status 07/03/2010 FINAL  Final  Culture, blood (routine x 2)     Status: None   Collection Time: 06/26/10  7:08 PM  Result Value Ref Range Status   Specimen Description BLOOD ARM RIGHT  Final   Special Requests BOTTLES DRAWN AEROBIC ONLY 8CC  Final   Culture  Setup Time [id number]  Final   Culture NO GROWTH 5 DAYS  Final   Report Status 07/03/2010 FINAL  Final  Urine culture     Status: None   Collection Time: 06/26/10  9:44 PM  Result Value Ref Range Status   Specimen Description URINE, CATHETERIZED  Final   Special Requests NONE   Final   Culture  Setup Time [id number]  Final   Colony Count NO GROWTH  Final   Culture NO GROWTH  Final   Report Status 06/27/2010 FINAL  Final    Coagulation Studies: No results for input(s): LABPROT, INR in the last 72 hours.  Urinalysis: No results for input(s): COLORURINE, LABSPEC, PHURINE, GLUCOSEU, HGBUR, BILIRUBINUR, KETONESUR, PROTEINUR, UROBILINOGEN, NITRITE, LEUKOCYTESUR in the last 168 hours.  Invalid input(s): APPERANCEUR  Lipid Panel:     Component Value Date/Time   CHOL 138 08/17/2015 0240   CHOL 134 06/14/2015 1558   TRIG 52 08/17/2015 0240   HDL 41 08/17/2015 0240  HDL 40 06/14/2015 1558   CHOLHDL 3.4 08/17/2015 0240   VLDL 10 08/17/2015 0240   LDLCALC 87 08/17/2015 0240   LDLCALC 71 06/14/2015 1558    HgbA1C:  Lab Results  Component Value Date   HGBA1C 5.3 08/17/2015    Urine Drug Screen:  No results found for: LABOPIA, COCAINSCRNUR, LABBENZ, AMPHETMU, THCU, LABBARB  Alcohol Level: No results for input(s): ETH in the last 168 hours.  Other results: EKG: normal EKG, normal sinus rhythm, unchanged from previous tracings.  Imaging: Dg Chest 2 View  Result Date: 10/05/2015 CLINICAL DATA:  82 year old male with seizure like activity, coughing. Recently discharged from physical rehab. Initial encounter. EXAM: CHEST  2 VIEW COMPARISON:  08/21/2015 and earlier. FINDINGS: Stable cardiomegaly and mediastinal contours. Sequelae of CABG. Chronically low lung volumes. Chronic mild elevation of the left hemidiaphragm. No pneumothorax or pulmonary edema. No pleural effusion or confluent pulmonary opacity. Osteopenia. Stable visualized osseous structures. Chronic appearing lower thoracic or upper lumbar compression fracture. IMPRESSION: Chronic low lung volumes.  No acute cardiopulmonary abnormality. Electronically Signed   By: Odessa Fleming M.D.   On: 10/05/2015 21:55   Ct Head Wo Contrast  Result Date: 10/05/2015 CLINICAL DATA:  New onset seizure.  On Eliquis. EXAM: CT  HEAD WITHOUT CONTRAST TECHNIQUE: Contiguous axial images were obtained from the base of the skull through the vertex without intravenous contrast. COMPARISON:  Head CT 08/16/2015, brain MRI 08/17/2015 FINDINGS: Brain: No intracranial hemorrhage, mass effect, or midline shift. There is stable ventriculomegaly from prior exam. Stable chronic small vessel ischemia. The basilar cisterns are patent. No evidence of territorial infarct. No intracranial fluid collection. Vascular: No hyperdense vessel or abnormal calcification. Extensive atherosclerotic calcifications of skullbase vasculature. Skull: Negative for fracture or focal lesion. Calvarium is intact. Sinuses/Orbits: Included paranasal sinuses and mastoid air cells are well aerated. Other: None. IMPRESSION: Stable ventriculomegaly and chronic small vessel ischemia without CT findings of acute intracranial abnormality. Electronically Signed   By: Rubye Oaks M.D.   On: 10/05/2015 22:06     Assessment/Plan:   82 y.o. male  witnessed episode of shaking and unresponsiveness followed by what seems to be. Confusion likely corresponding to a postictal state. He had some vomiting during the episode. Patient has no prior history of seizure disorder or prior seizures. He was recently worked up here in this hospital for TIA, which workup included MRI and EEG. MRI at that time did not show stroke, also did not show significant structural abnormality.   - EEG done and does not show any epileptiform activity  - I still believe it was true seizure - given the fact of prior event which was different will leave pt on anti epileptic medications.   - Pt was loaded on Keppra 1gm, will start 500 q12  - Pt had cough and infection could have lowered seizure threshold.    D/w family at bedside and over phone.  Pauletta Browns  10/06/2015, 3:04 PM

## 2015-10-06 NOTE — Care Management Note (Signed)
Case Management Note  Patient Details  Name: KEVINMICHAEL DEBOISE MRN: [id number] Date of Birth: [birth year]  Subjective/Objective:   To room to speak with patient, Tow adult daughters present in the room [place]. Patient is sleepy and resting at this time. Opens eyes periodically then drifts to sleep. Daughter [name] stated that the patient did not get much sleep last night.  Discussed discharge planning and case management with the daughters and gave my contact information. Daughter Luster Landsberg is POA and not present. According to the daughters patient is from home with his wife and has just been recently released from [place] where he was in [facility] last Wednesday. He has 24 hour paid sitters in the home.He has a Sit to Stand lift that is being rented and family would like to know if this is something that insurance would be able to provide. HE has a wheelchair  And a walker in the home. Family stated that he normally is oriented for the most part. "He is 89". Patient had been opened to [facility] when discharged from [facility] but daughter stated they had not gotten out to see him yet. [name]  Stated that he was prescribed a nebulizer machine for his acute bronchitis and received one but that the patients insurance is not paying for his nebulizer medciations because he has a acute diagnosis not chronic ICD 9 code. I informed the daughter to discuss with attending at rounds.  Will continue to follow .               Action/Plan: Anticipated discharge plan is home with HH. Will need PT evaluation.   Expected Discharge Date:                  Expected Discharge Plan:  Home w Home Health Services  In-House Referral:  Clinical Social Work  Discharge planning Services  CM Consult  Post Acute Care Choice:  Home Health, Resumption of Svcs/PTA Provider Choice offered to:     DME Arranged:    DME Agency:  [facility]  HH Arranged:  RN, PT [facility] Agency:  [facility]  Status of  Service:  In process, will continue to follow  If discussed at Long Length of Stay Meetings, dates discussed:    Additional Comments:  Adonis Huguenin, RN 10/06/2015, 11:26 AM

## 2015-10-06 NOTE — Consult Note (Signed)
PT was out of room but daughters were present. CH provided pastoral care & prayer as requested.

## 2015-10-07 LAB — URINALYSIS COMPLETE WITH MICROSCOPIC (ARMC ONLY)
BACTERIA UA: NONE SEEN
BILIRUBIN URINE: NEGATIVE
Glucose, UA: NEGATIVE mg/dL
HGB URINE DIPSTICK: NEGATIVE
Ketones, ur: NEGATIVE mg/dL
LEUKOCYTES UA: NEGATIVE
Nitrite: NEGATIVE
PH: 6 (ref 5.0–8.0)
PROTEIN: NEGATIVE mg/dL
Specific Gravity, Urine: 1.014 (ref 1.005–1.030)

## 2015-10-07 MED ORDER — SPIRONOLACTONE 25 MG PO TABS: 12.5000 mg | ORAL_TABLET | Freq: Every day | ORAL | Status: DC

## 2015-10-07 NOTE — Evaluation (Signed)
Physical Therapy Evaluation Patient Details Name: [name] [name] MRN: [id number] DOB: 08/31/1921 Today's Date: 10/07/2015   History of Present Illness  [name] [name] is a 82yo white male who comes to [facility] on 8/3 after episode of shaking, emesis, and unresponsiveness; suspected of having a seizure, however no prior history. PMH: PAD, CABG, CVA-like symptoms. PLOF includes mod-maxA with bed mob/transfers as of last week when he returned to home after 4weeks at [facility].   Clinical Impression  Pt is pleasant and conversational, requires help from daughter for history. Pt requires max-total physical assistance +2 for basic mobility/transfers, and is unable to trial AMB; these are baseline for the patient. No additional PT services needed at this time. The family is prepared to care for the patient at home at this level with adequate equipment and help. Recommending the patient continue with HHPT as planned PTA. No additional PT services needed at this time. Will sign off.     Follow Up Recommendations Home health PT (Pt is already set up to work with [facility]. )    Equipment Recommendations  None recommended by PT    Recommendations for Other Services       Precautions / Restrictions Precautions Precautions: None Restrictions Weight Bearing Restrictions: No      Mobility  Bed Mobility Overal bed mobility: +2 for physical assistance;Needs Assistance Bed Mobility: Supine to Sit;Sit to Supine     Supine to sit: +2 for physical assistance;Max assist Sit to supine: +2 for physical assistance;Max assist      Transfers Overall transfer level: Needs assistance Equipment used: 1 person hand held assist Transfers: Sit to/from Stand Sit to Stand: Modified independent (Device/Increase time);Max assist         General transfer comment: 3 attemps without success;   Ambulation/Gait                Stairs            Wheelchair Mobility    Modified Rankin  (Stroke Patients Only)       Balance                                             Pertinent Vitals/Pain Pain Assessment: No/denies pain    Home Living Family/patient expects to be discharged to:: Private residence Living Arrangements: Spouse/significant other Available Help at Discharge: Available 24 hours/day;Personal care attendant Type of Home: House Home Access: Ramped entrance     Home Layout: One level Home Equipment: Walker - 2 wheels;Cane - single point;Bedside commode;Wheelchair - manual;Hospital bed (hydraulic STS lift)      Prior Function Level of Independence: Needs assistance      ADL's / Homemaking Assistance Needed: Pt. required assist with ADLs  at home. Pt. took sponge baths.  Comments: unable to achieve AMB at rehab; heavy physical assistance required for basic mobility.      Hand Dominance        Extremity/Trunk Assessment                         Communication      Cognition                            General Comments      Exercises          Assessment/Plan    PT Assessment All further PT needs can be met in the next venue of care  PT Diagnosis Generalized weakness   PT Problem List Decreased strength;Decreased activity tolerance;Decreased mobility;Decreased balance  PT Treatment Interventions     PT Goals (Current goals can be found in the Care Plan section) Acute Rehab PT Goals Patient Stated Goal: Return to home  PT Goal Formulation: With patient Time For Goal Achievement: 10/21/15 Potential to Achieve Goals: Good    Frequency     Barriers to discharge        Co-evaluation               End of Session Equipment Utilized During Treatment: Gait belt Activity Tolerance: Patient limited by fatigue;Patient tolerated treatment well Patient left: in bed;with call bell/phone within reach;with family/visitor present Nurse Communication: Mobility status         Time: 1306-1325 PT  Time Calculation (min) (ACUTE ONLY): 19 min   Charges:   PT Evaluation $PT Eval High Complexity: 1 Procedure PT Treatments $Therapeutic Activity: 8-22 mins   PT G Codes:      1:35 PM, 10/07/15  C , PT, DPT Physical Therapist - [place] [phone] (ASCOM)  [phone] (mobile)    

## 2015-10-07 NOTE — Progress Notes (Signed)
Pt remaining alert and oriented. No complaints of pain during the night. Still with Rhonchi in lungs. Able to sleep during the night.

## 2015-10-07 NOTE — Progress Notes (Addendum)
[facility] - [place] at [facility]   PATIENT NAME: [name] [name]    MR#:  [id number]  DATE OF BIRTH:  1921-08-28  SUBJECTIVE:  CHIEF COMPLAINT:  Patient is resting comfortably. Answering all questions appropriately. No other episodes of seizures. No overnight events per RN report  REVIEW OF SYSTEMS:  CONSTITUTIONAL: No fever, fatigue or weakness.  EYES: No blurred or double vision.  EARS, NOSE, AND THROAT: No tinnitus or ear pain.  RESPIRATORY: No cough, shortness of breath, wheezing or hemoptysis.  CARDIOVASCULAR: No chest pain, orthopnea, edema.  GASTROINTESTINAL: No nausea, vomiting, diarrhea or abdominal pain.  GENITOURINARY: No dysuria, hematuria.  ENDOCRINE: No polyuria, nocturia,  HEMATOLOGY: No anemia, easy bruising or bleeding SKIN: No rash or lesion. MUSCULOSKELETAL: No joint pain or arthritis.   NEUROLOGIC: No tingling, numbness, weakness.  PSYCHIATRY: No anxiety or depression.   DRUG ALLERGIES:   Allergies  Allergen Reactions  . Penicillins Hives    Has patient had a PCN reaction causing immediate rash, facial/tongue/throat swelling, SOB or lightheadedness with hypotension: Yes Has patient had a PCN reaction causing severe rash involving mucus membranes or skin necrosis: no Has patient had a PCN reaction that required hospitalization No Has patient had a PCN reaction occurring within the last 10 years: No If all of the above answers are "NO", then may proceed with Cephalosporin use.  Pt unsure of reaction,   . Latex Rash  . Sulfonamide Derivatives Rash    VITALS:  Blood pressure (!) 111/53, pulse 68, temperature 97.7 F (36.5 C), temperature source Oral, resp. rate 20, height 5\' 11"  (1.803 m), weight 88.5 kg (195 lb), SpO2 92 %.  PHYSICAL EXAMINATION:  GENERAL:  82 y.o.-year-old patient lying in the bed with no acute distress.  EYES: Pupils equal, round, reactive to light and accommodation. No scleral icterus. Extraocular muscles  intact.  HEENT: Head atraumatic, normocephalic. Oropharynx and nasopharynx clear.  NECK:  Supple, no jugular venous distention. No thyroid enlargement, no tenderness.  LUNGS: Normal breath sounds bilaterally, no wheezing, rales,rhonchi or crepitation. No use of accessory muscles of respiration.  CARDIOVASCULAR: S1, S2 normal. No murmurs, rubs, or gallops.  ABDOMEN: Soft, nontender, nondistended. Bowel sounds present. No organomegaly or mass.  EXTREMITIES: No pedal edema, cyanosis, or clubbing.  NEUROLOGIC: Cranial nerves II through XII are intact. Muscle strength 5/5 in all extremities. Sensation intact. Gait not checked.  PSYCHIATRIC: The patient is alert and oriented x 3.  SKIN: No obvious rash, lesion, or ulcer.    LABORATORY PANEL:   CBC  Recent Labs Lab 10/06/15 0526  WBC 7.0  HGB 13.5  HCT 37.9*  PLT 152   ------------------------------------------------------------------------------------------------------------------  Chemistries   Recent Labs Lab 10/06/15 0526  NA 134*  K 3.9  CL 99*  CO2 29  GLUCOSE 112*  BUN 17  CREATININE 0.58*  CALCIUM 8.7*   ------------------------------------------------------------------------------------------------------------------  Cardiac Enzymes  Recent Labs Lab 10/05/15 2129  TROPONINI <0.03   ------------------------------------------------------------------------------------------------------------------  RADIOLOGY:  Dg Chest 2 View  Result Date: 10/05/2015 CLINICAL DATA:  82 year old male with seizure like activity, coughing. Recently discharged from physical rehab. Initial encounter. EXAM: CHEST  2 VIEW COMPARISON:  08/21/2015 and earlier. FINDINGS: Stable cardiomegaly and mediastinal contours. Sequelae of CABG. Chronically low lung volumes. Chronic mild elevation of the left hemidiaphragm. No pneumothorax or pulmonary edema. No pleural effusion or confluent pulmonary opacity. Osteopenia. Stable visualized osseous  structures. Chronic appearing lower thoracic or upper lumbar compression fracture. IMPRESSION: Chronic low lung volumes.  No acute  cardiopulmonary abnormality. Electronically Signed   By: Odessa Fleming M.D.   On: 10/05/2015 21:55   Ct Head Wo Contrast  Result Date: 10/05/2015 CLINICAL DATA:  New onset seizure.  On Eliquis. EXAM: CT HEAD WITHOUT CONTRAST TECHNIQUE: Contiguous axial images were obtained from the base of the skull through the vertex without intravenous contrast. COMPARISON:  Head CT 08/16/2015, brain MRI 08/17/2015 FINDINGS: Brain: No intracranial hemorrhage, mass effect, or midline shift. There is stable ventriculomegaly from prior exam. Stable chronic small vessel ischemia. The basilar cisterns are patent. No evidence of territorial infarct. No intracranial fluid collection. Vascular: No hyperdense vessel or abnormal calcification. Extensive atherosclerotic calcifications of skullbase vasculature. Skull: Negative for fracture or focal lesion. Calvarium is intact. Sinuses/Orbits: Included paranasal sinuses and mastoid air cells are well aerated. Other: None. IMPRESSION: Stable ventriculomegaly and chronic small vessel ischemia without CT findings of acute intracranial abnormality. Electronically Signed   By: Rubye Oaks M.D.   On: 10/05/2015 22:06    EKG:   Orders placed or performed during the hospital encounter of 10/05/15  . EKG 12-Lead  . EKG 12-Lead  . EKG    ASSESSMENT AND PLAN:   # Seizure [facility]) - Patient has no prior history of seizure  seizure  witnessed by a healthcare professional as well as his daughter which seems very much like it was a seizure.  KeppraWill be continued by mouth every 12 hours per neuro recommendations He had a recent neurological workup for TIA including MRI which did not show any significant structural abnormalities.  EEG done- neg MRI is negative for any stroke     #Acute bronchitis - patient was treated with azithromycin 6 weeks ago Continue  IV Rocephin.willd/c pt home with Omnicef (ch bronchitis J41.0, J 42) DuoNeb's every 6 hours Guaifenesin  X-ray is not suggestive of pneumonia  He is on amiodarone and there is some concern that his azithromycin could potentially prolong his QT interval.   # CAD (coronary artery disease) - continue home meds    #Chronic systolic (congestive) heart failure (HCC) -  Not fluid overloaded at this time  Holding spironolactone as BP is soft    #A-fib (HCC) - continue amiodarone and eliquis  # possible UTI U/A neg, urine cx pending , on rocephin   # Gen weakness - PT recommended HHPT   All the records are reviewed and case discussed with Care Management/Social Workerr. Management plans discussed with the patient, 2 daughters at bedside, Audie Pinto  is the healthcare power of attorney and they are in agreement.  CODE STATUS: fc  TOTAL TIME TAKING CARE OF THIS PATIENT: 36  minutes.   POSSIBLE D/C IN 1-2  DAYS, DEPENDING ON CLINICAL CONDITION.  Note: This dictation was prepared with Dragon dictation along with smaller phrase technology. Any transcriptional errors that result from this process are unintentional.   Ramonita Lab M.D on 10/07/2015 at 5:05 PM  Between 7am to 6pm - Pager - [phone] After 6pm go to [email or web address] - password [email or web address] [facility]  [facility]  Office  [phone]  CC: Primary care physician; [name], MD

## 2015-10-07 NOTE — Progress Notes (Signed)
Patient is A&O x4. Family in room all day. Repositioned q2h, Incont of large amounts of urine. On tele. Family suctioned patient as need d/t a productive cough. Edema to bilat lower legs R>L.

## 2015-10-08 LAB — URINE CULTURE

## 2015-10-08 MED ORDER — CEFDINIR 300 MG PO CAPS: 300.0000 mg | ORAL_CAPSULE | Freq: Two times a day (BID) | ORAL | 0 refills | Status: AC

## 2015-10-08 MED ORDER — ASPIRIN 81 MG PO TBEC: 81.0000 mg | DELAYED_RELEASE_TABLET | Freq: Every day | ORAL | 0 refills | Status: DC

## 2015-10-08 MED ORDER — LEVETIRACETAM 500 MG PO TABS: 500.0000 mg | ORAL_TABLET | Freq: Two times a day (BID) | ORAL | 0 refills | Status: DC

## 2015-10-08 MED ORDER — IPRATROPIUM-ALBUTEROL 0.5-2.5 (3) MG/3ML IN SOLN
3.0000 mL | Freq: Three times a day (TID) | RESPIRATORY_TRACT | Status: DC
  Administered 2015-10-08 (×2): 3 mL via RESPIRATORY_TRACT
  Filled 2015-10-08 (×2): qty 3

## 2015-10-08 MED ORDER — IPRATROPIUM-ALBUTEROL 0.5-2.5 (3) MG/3ML IN SOLN: 3.0000 mL | Freq: Four times a day (QID) | RESPIRATORY_TRACT | 0 refills | Status: DC

## 2015-10-08 MED ORDER — ACETAMINOPHEN 325 MG PO TABS: 650.0000 mg | ORAL_TABLET | Freq: Four times a day (QID) | ORAL | Status: AC | PRN

## 2015-10-08 MED ORDER — IPRATROPIUM-ALBUTEROL 0.5-2.5 (3) MG/3ML IN SOLN: 3.0000 mL | Freq: Four times a day (QID) | RESPIRATORY_TRACT | Status: DC | PRN

## 2015-10-08 MED ORDER — CEFDINIR 300 MG PO CAPS: 300.0000 mg | ORAL_CAPSULE | Freq: Two times a day (BID) | ORAL | 0 refills | Status: DC

## 2015-10-08 NOTE — Discharge Instructions (Signed)
Activity as tolerated per PT recommendations Follow-up with primary care physician in 5-7 days

## 2015-10-08 NOTE — Discharge Summary (Addendum)
[facility] - [place] at [facility]   PATIENT NAME: [name] [name]    MR#:  [id number]  DATE OF BIRTH:  1921-08-25  DATE OF ADMISSION:  10/05/2015 ADMITTING PHYSICIAN: Oralia Manis, MD  DATE OF DISCHARGE: 10/08/15 PRIMARY CARE PHYSICIAN: [name], MD    ADMISSION DIAGNOSIS:  Seizure (HCC) [R56.9] Bronchitis [J40]  DISCHARGE DIAGNOSIS:  Principal Problem:   Seizure (HCC) Active Problems:   CAD (coronary artery disease)   Chronic systolic (congestive) heart failure (HCC)   A-fib (HCC)   Acute bronchitis uti  SECONDARY DIAGNOSIS:   Past Medical History:  Diagnosis Date  . A-fib (HCC)   . Bradycardia    06/2010  . CAD (coronary artery disease)    nuclear 01/2008, no ischemia, EF 60%, distal anterior scar  . Carotid artery disease (HCC)    doppler 05/2009,  0-39% bilateral / doppler 06/2010 OK  . Edema    Ankle edema August, 2012  . Ejection fraction    EF 45%, echo,05/2009,apical hypo,  ?? posterior and lateral hypo  . Elevated CPK    11/23/08.Marland KitchenMarland Kitchenprobably from debris from popliteal aneurysm  . Fall at home July 2015  . Hx of CABG    1998  . Hyperlipidemia   . Intracranial hemorrhage (HCC)    Two small subependymal bleeds 06/2010,,ASA held  . PAD (peripheral artery disease) (HCC)    Above the knee to below the knee popliteal bypass,, right  //    Doppler,  July, 2013, patent  . Popliteal aneurysm (HCC)    .repair..11/25/2008  Dr. Darrick Penna  . Prominent abdominal aortic pulsation    doppler normal  . Speech problem    Neurologic event with brief infusion and speech difficulty.... January, 2011.... being assessed by neurology  February, 2011  . Syncope    .remote..no etiology  /  syncope 11/2008.. from acute leg pain  . TIA (transient ischemic attack)    Brief episode of confusion and speech difficulty, January, 2012  /  recurrent confusion speech difficulty September, 2012  . Venous insufficiency    support hose    HOSPITAL COURSE:   # Seizure  [facility]) - Patient has no prior history of seizure  seizure  witnessed by a healthcare professional as well as his daughter which seems very much like it was a seizure.  Keppra will be continued by mouth every 12 hours per neuro recommendations He had a recent neurological workup for TIA including MRI which did not show any significant structural abnormalities.  EEG done- neg MRI is negative for any stroke   #Acute bronchitis - patient was treated with azithromycin 6 weeks ago Given  IV Rocephin.will d/c pt home with Omnicef (ch bronchitis J41.0, J 42) for 7 days DuoNeb's every 6 hours Guaifenesin  X-ray is not suggestive of pneumonia  He is on amiodarone and there is some concern that his azithromycin could potentially prolong his QT interval.  #CAD (coronary artery disease) - continue home meds  #Chronic systolic (congestive) heart failure (HCC) -  Not fluid overloaded at this time  Holding spironolactone as BP is soft  #A-fib (HCC) - continue amiodarone and eliquis  # Acute UTI urine cx with pseudomonas, pan sensitive, d/c home with omnicef   # Gen weakness - PT recommended HHPT    DISCHARGE CONDITIONS:   fair  CONSULTS OBTAINED:  Treatment Team:  Pauletta Browns, MD   PROCEDURES none  DRUG ALLERGIES:   Allergies  Allergen Reactions  . Penicillins Hives  Has patient had a PCN reaction causing immediate rash, facial/tongue/throat swelling, SOB or lightheadedness with hypotension: Yes Has patient had a PCN reaction causing severe rash involving mucus membranes or skin necrosis: no Has patient had a PCN reaction that required hospitalization No Has patient had a PCN reaction occurring within the last 10 years: No If all of the above answers are "NO", then may proceed with Cephalosporin use.  Pt unsure of reaction,   . Latex Rash  . Sulfonamide Derivatives Rash    DISCHARGE MEDICATIONS:   Current Discharge Medication List    START taking  these medications   Details  acetaminophen (TYLENOL) 325 MG tablet Take 2 tablets (650 mg total) by mouth every 6 (six) hours as needed for mild pain (or Fever >/= 101).    cefdinir (OMNICEF) 300 MG capsule Take 1 capsule (300 mg total) by mouth 2 (two) times daily. Qty: 14 capsule, Refills: 0    levETIRAcetam (KEPPRA) 500 MG tablet Take 1 tablet (500 mg total) by mouth 2 (two) times daily. Qty: 60 tablet, Refills: 0      CONTINUE these medications which have CHANGED   Details  aspirin EC 81 MG EC tablet Take 1 tablet (81 mg total) by mouth daily. Qty: 30 tablet, Refills: 0    ipratropium-albuterol (DUONEB) 0.5-2.5 (3) MG/3ML SOLN Inhale 3 mLs into the lungs every 6 (six) hours. Qty: 360 mL, Refills: 0      CONTINUE these medications which have NOT CHANGED   Details  amiodarone (PACERONE) 200 MG tablet Take 1 tablet (200 mg total) by mouth daily.    apixaban (ELIQUIS) 5 MG TABS tablet Take 1 tablet (5 mg total) by mouth 2 (two) times daily. Qty: 60 tablet    benzonatate (TESSALON) 100 MG capsule Take 1 capsule (100 mg total) by mouth every 8 (eight) hours as needed for cough. Qty: 20 capsule, Refills: 0    calcium carbonate (TUMS - DOSED IN MG ELEMENTAL CALCIUM) 500 MG chewable tablet Chew 2 tablets by mouth daily.    docusate sodium (COLACE) 100 MG capsule Take 100 mg by mouth 2 (two) times daily as needed for mild constipation.    fluticasone (FLONASE) 50 MCG/ACT nasal spray Place 1 spray into both nostrils daily. Refills: 2    guaiFENesin (MUCINEX) 600 MG 12 hr tablet Take 1 tablet (600 mg total) by mouth 2 (two) times daily.    Multiple Vitamin (MULTIVITAMIN WITH MINERALS) TABS tablet Take 1 tablet by mouth daily.      STOP taking these medications     spironolactone (ALDACTONE) 25 MG tablet          DISCHARGE INSTRUCTIONS:   Activity as tolerated per PT recommendations Follow-up with primary care physician in 5-7 days    DIET:  Heart healthy    DISCHARGE CONDITION:  Fair  ACTIVITY:  Activity as tolerated per PT  OXYGEN:  Home Oxygen: No.   Oxygen Delivery: room air  DISCHARGE LOCATION:  home With home health  If you experience worsening of your admission symptoms, develop shortness of breath, life threatening emergency, suicidal or homicidal thoughts you must seek medical attention immediately by calling 911 or calling your MD immediately  if symptoms less severe.  You Must read complete instructions/literature along with all the possible adverse reactions/side effects for all the Medicines you take and that have been prescribed to you. Take any new Medicines after you have completely understood and accpet all the possible adverse reactions/side effects.   Please  note  You were cared for by a hospitalist during your hospital stay. If you have any questions about your discharge medications or the care you received while you were in the hospital after you are discharged, you can call the unit and asked to speak with the hospitalist on call if the hospitalist that took care of you is not available. Once you are discharged, your primary care physician will handle any further medical issues. Please note that NO REFILLS for any discharge medications will be authorized once you are discharged, as it is imperative that you return to your primary care physician (or establish a relationship with a primary care physician if you do not have one) for your aftercare needs so that they can reassess your need for medications and monitor your lab values.     Today  Chief Complaint  Patient presents with  . Shaking   Patient is doing fine. Cough is better. Denies any shortness of breath. No other episodes of seizures.  ROS:  CONSTITUTIONAL: Denies fevers, chills. Denies any fatigue, weakness.  EYES: Denies blurry vision, double vision, eye pain. EARS, NOSE, THROAT: Denies tinnitus, ear pain, hearing loss. RESPIRATORY: Improving cough,  denies wheeze, shortness of breath.  CARDIOVASCULAR: Denies chest pain, palpitations, edema.  GASTROINTESTINAL: Denies nausea, vomiting, diarrhea, abdominal pain. Denies bright red blood per rectum. GENITOURINARY: Denies dysuria, hematuria. ENDOCRINE: Denies nocturia or thyroid problems. HEMATOLOGIC AND LYMPHATIC: Denies easy bruising or bleeding. SKIN: Denies rash or lesion. MUSCULOSKELETAL: Denies pain in neck, back, shoulder, knees, hips or arthritic symptoms.  NEUROLOGIC: Denies paralysis, paresthesias.  PSYCHIATRIC: Denies anxiety or depressive symptoms.   VITAL SIGNS:  Blood pressure (!) 122/52, pulse 67, temperature 97.6 F (36.4 C), temperature source Oral, resp. rate 18, height 5\' 11"  (1.803 m), weight 89 kg (196 lb 5 oz), SpO2 100 %.  I/O:    Intake/Output Summary (Last 24 hours) at 10/08/15 1210 Last data filed at 10/08/15 1024  Gross per 24 hour  Intake              650 ml  Output                0 ml  Net              650 ml    PHYSICAL EXAMINATION:  GENERAL:  82 y.o.-year-old patient lying in the bed with no acute distress.  EYES: Pupils equal, round, reactive to light and accommodation. No scleral icterus. Extraocular muscles intact.  HEENT: Head atraumatic, normocephalic. Oropharynx and nasopharynx clear.  NECK:  Supple, no jugular venous distention. No thyroid enlargement, no tenderness.  LUNGS: Mod breath sounds bilaterally, no wheezing, rales,rhonchi or crepitation. No use of accessory muscles of respiration.  CARDIOVASCULAR: S1, S2 normal. No murmurs, rubs, or gallops.  ABDOMEN: Soft, non-tender, non-distended. Bowel sounds present. No organomegaly or mass.  EXTREMITIES: No pedal edema, cyanosis, or clubbing.  NEUROLOGIC: Cranial nerves II through XII are intact. Muscle strength 5/5 in all extremities. Sensation intact. Gait not checked.  PSYCHIATRIC: The patient is alert and oriented x 3.  SKIN: No obvious rash, lesion, or ulcer.   DATA REVIEW:    CBC  Recent Labs Lab 10/06/15 0526  WBC 7.0  HGB 13.5  HCT 37.9*  PLT 152    Chemistries   Recent Labs Lab 10/06/15 0526  NA 134*  K 3.9  CL 99*  CO2 29  GLUCOSE 112*  BUN 17  CREATININE 0.58*  CALCIUM 8.7*    Cardiac Enzymes  Recent Labs Lab 10/05/15 2129  TROPONINI <0.03    Microbiology Results  Results for orders placed or performed during the hospital encounter of 10/05/15  Urine culture     Status: Abnormal   Collection Time: 10/06/15  1:43 PM  Result Value Ref Range Status   Specimen Description URINE, RANDOM  Final   Special Requests NONE  Final   Culture 50,000 COLONIES/mL PSEUDOMONAS AERUGINOSA (A)  Final   Report Status 10/08/2015 FINAL  Final   Organism ID, Bacteria PSEUDOMONAS AERUGINOSA (A)  Final      Susceptibility   Pseudomonas aeruginosa - MIC*    CEFTAZIDIME 4 SENSITIVE Sensitive     CIPROFLOXACIN <=0.25 SENSITIVE Sensitive     GENTAMICIN 2 SENSITIVE Sensitive     IMIPENEM 1 SENSITIVE Sensitive     PIP/TAZO 8 SENSITIVE Sensitive     CEFEPIME 2 SENSITIVE Sensitive     * 50,000 COLONIES/mL PSEUDOMONAS AERUGINOSA    RADIOLOGY:  Dg Chest 2 View  Result Date: 10/05/2015 CLINICAL DATA:  82 year old male with seizure like activity, coughing. Recently discharged from physical rehab. Initial encounter. EXAM: CHEST  2 VIEW COMPARISON:  08/21/2015 and earlier. FINDINGS: Stable cardiomegaly and mediastinal contours. Sequelae of CABG. Chronically low lung volumes. Chronic mild elevation of the left hemidiaphragm. No pneumothorax or pulmonary edema. No pleural effusion or confluent pulmonary opacity. Osteopenia. Stable visualized osseous structures. Chronic appearing lower thoracic or upper lumbar compression fracture. IMPRESSION: Chronic low lung volumes.  No acute cardiopulmonary abnormality. Electronically Signed   By: Odessa FlemingH  Hall M.D.   On: 10/05/2015 21:55   Ct Head Wo Contrast  Result Date: 10/05/2015 CLINICAL DATA:  New onset seizure.  On  Eliquis. EXAM: CT HEAD WITHOUT CONTRAST TECHNIQUE: Contiguous axial images were obtained from the base of the skull through the vertex without intravenous contrast. COMPARISON:  Head CT 08/16/2015, brain MRI 08/17/2015 FINDINGS: Brain: No intracranial hemorrhage, mass effect, or midline shift. There is stable ventriculomegaly from prior exam. Stable chronic small vessel ischemia. The basilar cisterns are patent. No evidence of territorial infarct. No intracranial fluid collection. Vascular: No hyperdense vessel or abnormal calcification. Extensive atherosclerotic calcifications of skullbase vasculature. Skull: Negative for fracture or focal lesion. Calvarium is intact. Sinuses/Orbits: Included paranasal sinuses and mastoid air cells are well aerated. Other: None. IMPRESSION: Stable ventriculomegaly and chronic small vessel ischemia without CT findings of acute intracranial abnormality. Electronically Signed   By: Rubye OaksMelanie  Ehinger M.D.   On: 10/05/2015 22:06    EKG:   Orders placed or performed during the hospital encounter of 10/05/15  . EKG 12-Lead  . EKG 12-Lead  . EKG      Management plans discussed with the patient, family and they are in agreement.  CODE STATUS:     Code Status Orders        Start     Ordered   10/06/15 0032  Full code  Continuous     10/06/15 0031    Code Status History    Date Active Date Inactive Code Status Order ID Comments User Context   08/17/2015  1:14 AM 08/17/2015  1:14 AM Full Code [id number]  Joella PrinceJames Sullivan, MD ED   08/17/2015  1:14 AM 08/22/2015  6:03 PM Full Code [id number]  Joella PrinceJames Sullivan, MD ED    Advance Directive Documentation   Flowsheet Row Most Recent Value  Type of Advance Directive  Healthcare Power of Attorney, Living will  Pre-existing out of facility DNR order (yellow form or pink MOST form)  No  data  "MOST" Form in Place?  No data      TOTAL TIME TAKING CARE OF THIS PATIENT: 45  minutes.   Note: This dictation was prepared with  Dragon dictation along with smaller phrase technology. Any transcriptional errors that result from this process are unintentional.   @  on 10/08/2015 at 12:10 PM  Between 7am to 6pm - Pager - [phone]  After 6pm go to [email or web address] - password [email or web address] [facility]  [facility]  Office  [phone]  CC: Primary care physician; [name], MD

## 2015-10-08 NOTE — Care Management Note (Signed)
Case Management Note  Patient Details  Name: [name] [name] MRN: [id number] Date of Birth: 05/26/1921  Subjective/Objective:    [name] referral for home healt PT and Aide was faxed to [facility] per patient's choice.                 Action/Plan:   Expected Discharge Date:                  Expected Discharge Plan:  Home w Home Health Services  In-House Referral:  Clinical Social Work  Discharge planning Services  CM Consult  Post Acute Care Choice:  Home Health, Resumption of Svcs/PTA Provider Choice offered to:     DME Arranged:    DME Agency:  [facility]  HH Arranged[facility]  [facility]  Status of Service:  In process, will continue to follow  If discussed at Long Length of Stay Meetings, dates discussed:    Additional Comments:  [name] Harnish A, RN 10/08/2015, 1:07 PM

## 2015-10-08 NOTE — Progress Notes (Signed)
Patient was discharged home with family via EMS. Daughter took personal belongings and discharge paperwork.  Allowed time for questions. IVs removed with caths intact. VSS.

## 2015-10-13 ENCOUNTER — Telehealth: Payer: Self-pay | Admitting: Family Medicine

## 2015-10-13 NOTE — Telephone Encounter (Signed)
Pt being treated for UTI, pt is done antibiotics can they bring in sample for recheck.  She also would like a standing order so whenever Awanda Minklbert has signs or symptoms of UTI they can bring in a urine.  Please call Mardella LaymanLindsey to discuss.

## 2015-10-16 ENCOUNTER — Other Ambulatory Visit: Payer: Self-pay

## 2015-10-16 DIAGNOSIS — R8290 Unspecified abnormal findings in urine: Secondary | ICD-10-CM

## 2015-10-16 NOTE — Telephone Encounter (Signed)
Yes and yes 

## 2015-10-16 NOTE — Telephone Encounter (Signed)
Urine check ok'd per Dr. Dossie Arbourrissman.  Standing orders put in

## 2015-10-17 ENCOUNTER — Telehealth: Payer: Self-pay

## 2015-10-17 NOTE — Telephone Encounter (Signed)
Call pt 

## 2015-10-17 NOTE — Telephone Encounter (Signed)
When she did her evaluation Friday, patient's feet felt cold with dusky toes, appeared to be having circulation problems,  Daughter stated worsens with compression stockings.  She is wondering if he needs to wear those?

## 2015-10-17 NOTE — Telephone Encounter (Signed)
Amy calling to get verbal order for checking patient's urine.  Orders entered for this yesterday, so verbal ok given

## 2015-10-19 ENCOUNTER — Telehealth: Payer: Self-pay | Admitting: Family Medicine

## 2015-10-19 NOTE — Telephone Encounter (Signed)
[facility] called back stating [name] [name] has a 3X1.8cm sore on his heel.  Wanted to know if Dr Dossie Arbourrissman had any suggestions for treatment.

## 2015-10-19 NOTE — Telephone Encounter (Signed)
Please do a  Speech therapy evaluation

## 2015-10-19 NOTE — Telephone Encounter (Signed)
Home health nurse requesting verbal order for speech therapy due to the pt chocking on liquids.  Saw Dr Dossie Arbourrissman in the hall and he ok'd the ordered.  I gave them the message Dr Dossie Arbourrissman ok'd the order.

## 2015-10-20 NOTE — Telephone Encounter (Signed)
Spoke with [facility], advised to have Home Health use their wound care protocol.  Spoke with Lillia AbedLindsay, their wound care nurse will observe and treat.  She is hearing RLL crackles and is wondering if they need to do an xray.  Patient does "drool a lot" and may be aspirating

## 2015-10-23 ENCOUNTER — Telehealth: Payer: Self-pay | Admitting: Family Medicine

## 2015-10-23 ENCOUNTER — Other Ambulatory Visit: Payer: Medicare Other

## 2015-10-23 DIAGNOSIS — R8290 Unspecified abnormal findings in urine: Secondary | ICD-10-CM

## 2015-10-23 LAB — URINALYSIS, ROUTINE W REFLEX MICROSCOPIC
Bilirubin, UA: NEGATIVE
Glucose, UA: NEGATIVE
Ketones, UA: NEGATIVE
LEUKOCYTES UA: NEGATIVE
NITRITE UA: NEGATIVE
PH UA: 6 (ref 5.0–7.5)
Protein, UA: NEGATIVE
RBC UA: NEGATIVE
SPEC GRAV UA: 1.015 (ref 1.005–1.030)
Urobilinogen, Ur: 1 mg/dL (ref 0.2–1.0)

## 2015-10-23 NOTE — Telephone Encounter (Signed)
Home health check patient as requested

## 2015-10-23 NOTE — Telephone Encounter (Signed)
Verbal order for chest xray

## 2015-10-23 NOTE — Telephone Encounter (Signed)
Pt has had increased coughing with mucous and change in sleeping habits due to coughing. He has only had 2 bowel movements in 3 weeks, 1 was Saturday. Noreene LarssonJill  would like to get verbal order to go out to follow up with pt.  Pts daughter was able to obtain a urine sample which she will be bringing by this afternoon.

## 2015-10-23 NOTE — Telephone Encounter (Signed)
Verbal given 

## 2015-10-24 ENCOUNTER — Telehealth: Payer: Self-pay

## 2015-10-24 NOTE — Telephone Encounter (Signed)
Phone call Discussed with home health is been out to evaluate patient for several concerns Urinary symptoms patient voiding clear urine urinalysis yesterday here was clear will observe Constipation patient taking Duke locks twice a day having difficult and rare bowel movements will add MiraLAX Coughing had some decreased breath sounds left lower lobe chest x-ray was taken by a portable x-ray will wait on x-ray report. Patient otherwise does not look ill and seems to be doing well.

## 2015-10-26 ENCOUNTER — Other Ambulatory Visit (HOSPITAL_COMMUNITY): Payer: Medicare Other

## 2015-10-26 ENCOUNTER — Encounter (HOSPITAL_COMMUNITY): Payer: Medicare Other

## 2015-10-26 ENCOUNTER — Ambulatory Visit: Payer: Medicare Other | Admitting: Family

## 2015-10-30 ENCOUNTER — Other Ambulatory Visit: Payer: Self-pay | Admitting: Family Medicine

## 2015-10-30 MED ORDER — APIXABAN 5 MG PO TABS: 5.0000 mg | ORAL_TABLET | Freq: Two times a day (BID) | ORAL | 0 refills | Status: DC

## 2015-10-30 NOTE — Telephone Encounter (Signed)
Pt needs refill on apixaban (ELIQUIS) 5 MG TABS tablet [facility] with [facility] day supply.

## 2015-10-30 NOTE — Telephone Encounter (Signed)
Routing to provider  

## 2015-10-31 ENCOUNTER — Telehealth: Payer: Self-pay

## 2015-10-31 ENCOUNTER — Other Ambulatory Visit: Payer: Self-pay | Admitting: Family Medicine

## 2015-10-31 NOTE — Telephone Encounter (Signed)
He does look like he had some scarring on his lungs, but nothing that needs to be treated. Everything looks normal otherwise.

## 2015-10-31 NOTE — Telephone Encounter (Signed)
Patient's daughter Luster Landsberg(Renee) called stating someone was to call back this week with results we THINK for his mobile xray, it does not look like anyone has signed off on it.  Please let me know what to tell daughter about it or call if you need to speak with her

## 2015-11-01 NOTE — Telephone Encounter (Signed)
Looks like he needs a hospital follow up ASAP

## 2015-11-01 NOTE — Telephone Encounter (Signed)
Patient has hospital f/u scheduled for next Wed. Sept. 6th. I offered them Thursday or Friday but daughter stated she needed time to arrange transportation.  Dr. Shela CommonsJ, will you refill his keppra, he will be out on Saturday.

## 2015-11-03 ENCOUNTER — Telehealth: Payer: Self-pay | Admitting: Family Medicine

## 2015-11-03 MED ORDER — APIXABAN 5 MG PO TABS: 5.0000 mg | ORAL_TABLET | Freq: Two times a day (BID) | ORAL | 0 refills | Status: DC

## 2015-11-03 NOTE — Telephone Encounter (Signed)
Fleet ContrasRachel, you wrote this rx earlier this week, but they need it changed to a 90 day supply and sent to [facility].

## 2015-11-03 NOTE — Telephone Encounter (Signed)
[facility] supply [facility]

## 2015-11-03 NOTE — Telephone Encounter (Signed)
Pt daughter called stated they need a 90 day supply of Eloquis sent to [facility]. 90 day supply of the medication is cheaper for the patient. Please send ASAP. Pt only has a 1 week supply left. Thanks.

## 2015-11-08 ENCOUNTER — Encounter: Payer: Self-pay | Admitting: Family Medicine

## 2015-11-08 ENCOUNTER — Ambulatory Visit (INDEPENDENT_AMBULATORY_CARE_PROVIDER_SITE_OTHER): Payer: Medicare Other | Admitting: Family Medicine

## 2015-11-08 VITALS — BP 132/78 | HR 66 | Temp 97.7°F

## 2015-11-08 DIAGNOSIS — J40 Bronchitis, not specified as acute or chronic: Secondary | ICD-10-CM

## 2015-11-08 DIAGNOSIS — R399 Unspecified symptoms and signs involving the genitourinary system: Secondary | ICD-10-CM | POA: Diagnosis not present

## 2015-11-08 DIAGNOSIS — Z23 Encounter for immunization: Secondary | ICD-10-CM

## 2015-11-08 DIAGNOSIS — R569 Unspecified convulsions: Secondary | ICD-10-CM

## 2015-11-08 MED ORDER — BENZONATATE 100 MG PO CAPS: 100.0000 mg | ORAL_CAPSULE | Freq: Three times a day (TID) | ORAL | 0 refills | Status: DC | PRN

## 2015-11-08 MED ORDER — IPRATROPIUM-ALBUTEROL 0.5-2.5 (3) MG/3ML IN SOLN: 3.0000 mL | Freq: Four times a day (QID) | RESPIRATORY_TRACT | 0 refills | Status: DC

## 2015-11-08 NOTE — Assessment & Plan Note (Signed)
Neurology referral placed to discuss Keppra dosage

## 2015-11-08 NOTE — Progress Notes (Signed)
BP 132/78   Pulse 66   Temp 97.7 F (36.5 C)   SpO2 94%    Subjective:    Patient ID: [name] [name], male    DOB: 02-Dec-1921, 82 y.o.   MRN: [id number]  HPI: [name] [name] is a 82 y.o. male  Chief Complaint  Patient presents with  . Hospitalization Follow-up    Was admitted for seizure and bronchitis. States he is feeling much better.   . Immunizations    they want to discuss his cough before getting the flu shot.   Patient presents for hospital follow up from rehab center. Was hospitalized about a month ago following what appeared to be a seizure. Is now on keppra twice daily. Daughter has noted somnolence and weakness since he's been on this medicine and wanting to discuss lowering the dose.  His previous diagnosis of bronchitis was also addressed during hospitalization. Neurology work-up negative, bronchitis seems to be resolving for the most part aside from a slight residual cough. Mobile CXR 2-3 weeks ago was normal. Taking mucinex, duoneb as needed, tessalon perles as needed.  Daughter was also concerned that he seems to be urinating less frequently lately, mostly noticing overnight that he is no longer wetting in bed. Concerned there may be something wrong with his kidneys.   Relevant past medical, surgical, family and social history reviewed and updated as indicated. Interim medical history since our last visit reviewed. Allergies and medications reviewed and updated.  Review of Systems  Constitutional: Negative.   HENT: Negative.   Eyes: Negative.   Respiratory: Positive for cough. Negative for shortness of breath and wheezing.   Cardiovascular: Negative for chest pain.  Gastrointestinal: Negative.   Genitourinary: Negative.   Musculoskeletal: Negative.   Skin: Negative.   Neurological: Negative.   Psychiatric/Behavioral: Negative.     Per HPI unless specifically indicated above     Objective:    BP 132/78   Pulse 66   Temp 97.7 F (36.5 C)   SpO2 94%     Wt Readings from Last 3 Encounters:  10/08/15 196 lb 5 oz (89 kg)  10/02/15 196 lb 8 oz (89.1 kg)  08/17/15 207 lb 4 oz (94 kg)    Physical Exam  Constitutional: He is oriented to person, place, and time. He appears well-developed and well-nourished.  HENT:  Head: Atraumatic.  Eyes: Conjunctivae are normal.  Neck: Neck supple.  Cardiovascular: Normal rate.   Pulmonary/Chest: Effort normal. No respiratory distress. He has no wheezes.  Musculoskeletal: Normal range of motion.  Lymphadenopathy:    He has no cervical adenopathy.  Neurological: He is alert and oriented to person, place, and time.  Skin: Skin is warm and dry.  Psychiatric: He has a normal mood and affect. His behavior is normal.    Results for orders placed or performed in visit on 10/23/15  Urinalysis, Routine w reflex microscopic (not at [facility])  Result Value Ref Range   Specific Gravity, UA 1.015 1.005 - 1.030   pH, UA 6.0 5.0 - 7.5   Color, UA Yellow Yellow   Appearance Ur Clear Clear   Leukocytes, UA Negative Negative   Protein, UA Negative Negative/Trace   Glucose, UA Negative Negative   Ketones, UA Negative Negative   RBC, UA Negative Negative   Bilirubin, UA Negative Negative   Urobilinogen, Ur 1.0 0.2 - 1.0 mg/dL   Nitrite, UA Negative Negative      Assessment & Plan:   Problem List Items Addressed This  Visit      Other   Seizure [facility](HCC) - Primary    Neurology referral placed to discuss Keppra dosage      Relevant Orders   Ambulatory referral to Neurology    Other Visit Diagnoses    Urinary symptom or sign       Will check CMP today, family will bring urine specimen in as soon as possible for U/A.   Relevant Orders   Comprehensive metabolic panel   UA/M w/rflx Culture, Routine   Bronchitis       Resolving, continue current regimen of duonebs, tessalon perles, and mucinex. Stay well hydrated, prop up as often as possible.        Follow up plan: Return for As scheduled for physical  exam.

## 2015-11-09 ENCOUNTER — Encounter: Payer: Self-pay | Admitting: Family Medicine

## 2015-11-09 LAB — UA/M W/RFLX CULTURE, ROUTINE
Bilirubin, UA: NEGATIVE
GLUCOSE, UA: NEGATIVE
Ketones, UA: NEGATIVE
LEUKOCYTES UA: NEGATIVE
Nitrite, UA: NEGATIVE
PH UA: 6 (ref 5.0–7.5)
PROTEIN UA: NEGATIVE
Specific Gravity, UA: 1.015 (ref 1.005–1.030)
Urobilinogen, Ur: 1 mg/dL (ref 0.2–1.0)

## 2015-11-09 LAB — COMPREHENSIVE METABOLIC PANEL
A/G RATIO: 1.2 (ref 1.2–2.2)
ALT: 11 IU/L (ref 0–44)
AST: 16 IU/L (ref 0–40)
Albumin: 3.5 g/dL (ref 3.2–4.6)
Alkaline Phosphatase: 67 IU/L (ref 39–117)
BILIRUBIN TOTAL: 0.4 mg/dL (ref 0.0–1.2)
BUN/Creatinine Ratio: 24 (ref 10–24)
BUN: 17 mg/dL (ref 10–36)
CALCIUM: 9 mg/dL (ref 8.6–10.2)
CHLORIDE: 96 mmol/L (ref 96–106)
CO2: 29 mmol/L (ref 18–29)
Creatinine, Ser: 0.7 mg/dL — ABNORMAL LOW (ref 0.76–1.27)
GFR, EST AFRICAN AMERICAN: 95 mL/min/{1.73_m2} (ref >59)
GFR, EST NON AFRICAN AMERICAN: 83 mL/min/{1.73_m2} (ref >59)
GLOBULIN, TOTAL: 2.9 g/dL (ref 1.5–4.5)
Glucose: 97 mg/dL (ref 65–99)
POTASSIUM: 5 mmol/L (ref 3.5–5.2)
SODIUM: 137 mmol/L (ref 134–144)
TOTAL PROTEIN: 6.4 g/dL (ref 6.0–8.5)

## 2015-11-09 LAB — MICROSCOPIC EXAMINATION: WBC, UA: NONE SEEN /hpf (ref 0–?)

## 2015-11-09 NOTE — Addendum Note (Signed)
Addended byClaudine Mouton: WORKMAN, AMY J on: 11/09/2015 08:04 AM   Modules accepted: Orders

## 2015-11-11 ENCOUNTER — Emergency Department: Payer: Medicare Other

## 2015-11-11 ENCOUNTER — Emergency Department
Admission: EM | Admit: 2015-11-11 | Discharge: 2015-11-12 | Disposition: A | Discharge status: Home / Self Care | Payer: Medicare Other | Attending: Emergency Medicine | Admitting: Emergency Medicine

## 2015-11-11 ENCOUNTER — Encounter: Payer: Self-pay | Admitting: Emergency Medicine

## 2015-11-11 DIAGNOSIS — Y9289 Other specified places as the place of occurrence of the external cause: Secondary | ICD-10-CM | POA: Diagnosis not present

## 2015-11-11 DIAGNOSIS — W19XXXA Unspecified fall, initial encounter: Secondary | ICD-10-CM

## 2015-11-11 DIAGNOSIS — Z87891 Personal history of nicotine dependence: Secondary | ICD-10-CM | POA: Insufficient documentation

## 2015-11-11 DIAGNOSIS — I5022 Chronic systolic (congestive) heart failure: Secondary | ICD-10-CM | POA: Insufficient documentation

## 2015-11-11 DIAGNOSIS — Z951 Presence of aortocoronary bypass graft: Secondary | ICD-10-CM | POA: Diagnosis not present

## 2015-11-11 DIAGNOSIS — Z8673 Personal history of transient ischemic attack (TIA), and cerebral infarction without residual deficits: Secondary | ICD-10-CM | POA: Diagnosis not present

## 2015-11-11 DIAGNOSIS — S61412A Laceration without foreign body of left hand, initial encounter: Secondary | ICD-10-CM | POA: Diagnosis not present

## 2015-11-11 DIAGNOSIS — I2510 Atherosclerotic heart disease of native coronary artery without angina pectoris: Secondary | ICD-10-CM | POA: Diagnosis not present

## 2015-11-11 DIAGNOSIS — W07XXXA Fall from chair, initial encounter: Secondary | ICD-10-CM | POA: Insufficient documentation

## 2015-11-11 DIAGNOSIS — Y9389 Activity, other specified: Secondary | ICD-10-CM | POA: Diagnosis not present

## 2015-11-11 DIAGNOSIS — T148XXA Other injury of unspecified body region, initial encounter: Secondary | ICD-10-CM

## 2015-11-11 DIAGNOSIS — S0083XA Contusion of other part of head, initial encounter: Secondary | ICD-10-CM | POA: Insufficient documentation

## 2015-11-11 DIAGNOSIS — S0003XA Contusion of scalp, initial encounter: Secondary | ICD-10-CM

## 2015-11-11 DIAGNOSIS — Y999 Unspecified external cause status: Secondary | ICD-10-CM | POA: Diagnosis not present

## 2015-11-11 DIAGNOSIS — S0990XA Unspecified injury of head, initial encounter: Secondary | ICD-10-CM | POA: Diagnosis present

## 2015-11-11 DIAGNOSIS — S61511A Laceration without foreign body of right wrist, initial encounter: Secondary | ICD-10-CM | POA: Insufficient documentation

## 2015-11-11 LAB — BASIC METABOLIC PANEL
Anion gap: 4 — ABNORMAL LOW (ref 5–15)
BUN: 16 mg/dL (ref 6–20)
CHLORIDE: 98 mmol/L — AB (ref 101–111)
CO2: 31 mmol/L (ref 22–32)
CREATININE: 0.99 mg/dL (ref 0.61–1.24)
Calcium: 8.8 mg/dL — ABNORMAL LOW (ref 8.9–10.3)
GFR calc Af Amer: >60 mL/min (ref >60)
GFR calc non Af Amer: >60 mL/min (ref >60)
GLUCOSE: 102 mg/dL — AB (ref 65–99)
POTASSIUM: 4.8 mmol/L (ref 3.5–5.1)
SODIUM: 133 mmol/L — AB (ref 135–145)

## 2015-11-11 LAB — CBC
HEMATOCRIT: 39.4 % — AB (ref 40.0–52.0)
HEMOGLOBIN: 13.6 g/dL (ref 13.0–18.0)
MCH: 31.3 pg (ref 26.0–34.0)
MCHC: 34.5 g/dL (ref 32.0–36.0)
MCV: 90.9 fL (ref 80.0–100.0)
Platelets: 193 10*3/uL (ref 150–440)
RBC: 4.33 MIL/uL — AB (ref 4.40–5.90)
RDW: 14.6 % — ABNORMAL HIGH (ref 11.5–14.5)
WBC: 7.3 10*3/uL (ref 3.8–10.6)

## 2015-11-11 MED ORDER — BACITRACIN-NEOMYCIN-POLYMYXIN 400-5-5000 EX OINT
TOPICAL_OINTMENT | Freq: Once | CUTANEOUS | Status: AC
  Administered 2015-11-11: 1 via TOPICAL
  Filled 2015-11-11: qty 1

## 2015-11-11 NOTE — ED Notes (Addendum)
Two skin tears. Cleaned rt. Wrist on rt. Hand and applied bacitracin and bandage. Cleaned lt. Hand and applied bacitracin and bandage.

## 2015-11-11 NOTE — ED Provider Notes (Signed)
[facility] Emergency Department Provider Note  ____________________________________________  Time seen: Approximately 6:37 PM  I have reviewed the triage vital signs and the nursing notes.   HISTORY  Chief Complaint Fall    HPI [name] [name] is a 82 y.o. male w/ a history of A. fib on our request, TIA, CAD presenting with fall. The patient reports that he was sitting in his chair, and usually requires the assistance of a lift to move from chair to wheelchair. However, he tried to get up on his own, and fell by tipping forward, striking his forehead. He describes a brief period of loss of consciousness. The fall was unwitnessed but the caregiver came right away, there is no history of tonic-clonic activity, post ictal state, or urinary or fecal incontinence. The patient has not had any recent illness including nausea vomiting or diarrhea, fever or chills, and he does not report any chest pain, shortness of breath, or palpitations. He has no numbness tingling or weakness.   Past Medical History:  Diagnosis Date  . A-fib (HCC)   . Bradycardia    06/2010  . CAD (coronary artery disease)    nuclear 01/2008, no ischemia, EF 60%, distal anterior scar  . Carotid artery disease (HCC)    doppler 05/2009,  0-39% bilateral / doppler 06/2010 OK  . Edema    Ankle edema August, 2012  . Ejection fraction    EF 45%, echo,05/2009,apical hypo,  ?? posterior and lateral hypo  . Elevated CPK    11/23/08.Marland Kitchen.Marland Kitchen.probably from debris from popliteal aneurysm  . Fall at home July 2015  . Hx of CABG    1998  . Hyperlipidemia   . Intracranial hemorrhage (HCC)    Two small subependymal bleeds 06/2010,,ASA held  . PAD (peripheral artery disease) (HCC)    Above the knee to below the knee popliteal bypass,, right  //    Doppler,  July, 2013, patent  . Popliteal aneurysm (HCC)    .repair..11/25/2008  Dr. Darrick PennaFields  . Prominent abdominal aortic pulsation    doppler normal  . Speech problem     Neurologic event with brief infusion and speech difficulty.... January, 2011.... being assessed by neurology  February, 2011  . Syncope    .remote..no etiology  /  syncope 11/2008.. from acute leg pain  . TIA (transient ischemic attack)    Brief episode of confusion and speech difficulty, January, 2012  /  recurrent confusion speech difficulty September, 2012  . Venous insufficiency    support hose    Patient Active Problem List   Diagnosis Date Noted  . Acute bronchitis 10/06/2015  . Seizure (HCC) 10/05/2015  . A-fib (HCC) 10/05/2015  . Chronic systolic (congestive) heart failure (HCC) 09/07/2015  . Altered mental status 08/18/2015  . CAD in native artery 08/07/2015  . Arterial disease (HCC) 08/07/2015  . HLD (hyperlipidemia) 08/07/2015  . Other specified symptoms and signs involving the circulatory and respiratory systems 08/07/2015  . Speech disorder 08/07/2015  . Temporary cerebral vascular dysfunction 08/07/2015  . Chronic venous insufficiency 08/07/2015  . Pneumonia 02/09/2015  . PNA (pneumonia) 02/09/2015  . H/O neoplasm 08/22/2014  . H/O malignant neoplasm of skin 08/22/2014  . 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 Peripheral vascular disease (HCC) 02/28/2014  . Postsurgical aortocoronary bypass status 02/28/2014  . Syncope and collapse 02/28/2014  . 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 Discoloration of skin of foot-Left 10/13/2013  . 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  Disorder of pigmentation 10/13/2013  . PAD (peripheral artery disease) (HCC)   . TIA (transient ischemic attack)   . Edema   . Hyperlipidemia   . Syncope   . CAD (coronary artery disease)   . Venous insufficiency   . Carotid artery disease (HCC)   . Elevated CPK   . Popliteal aneurysm (HCC)   . Speech problem   . Hx of CABG   . Ejection fraction   . Prominent abdominal aortic pulsation   . Intracranial hemorrhage (HCC)   . Bradycardia      Past Surgical History:  Procedure Laterality Date  . CORONARY ARTERY BYPASS GRAFT  1998  . popliteal bypass  with reversed greater saphenous vein from right leg.      Current Outpatient Rx  . Order #: [id number] Class: OTC  . Order #: [id number] Class: Normal  . Order #: [id number] Class: Normal  . Order #: [id number] Class: Normal  . Order #: [id number] Class: No Print  . Order #: [id number] Class: Historical Med  . Order #: [id number] Class: Historical Med  . Order #: [id number] Class: No Print  . Order #: [id number] Class: No Print  . Order #: [id number] Class: Normal  . Order #: [id number] Class: Normal  . Order #: [id number] Class: Historical Med    Allergies Penicillins; Latex; and Sulfonamide derivatives  Family History  Problem Relation Age of Onset  . Stroke Mother   . Diabetes Mother   . Diabetes Sister     Social History Social History  Substance Use Topics  . Smoking status: Former Smoker    Years: 20.00    Types: Cigarettes    Quit date: 09/12/1946  . Smokeless tobacco: Never Used     Comment: quit appox 50 yrs ago  . Alcohol use No    Review of Systems Constitutional: No fever/chills.  + Fall.  + LOC Eyes: No visual changes.  No blurred or double vision. ENT: No sore throat. No congestion or rhinorrhea. Cardiovascular: Denies chest pain. Denies palpitations. Respiratory: Denies shortness of breath.  No cough. Gastrointestinal: No abdominal pain.  No nausea, no vomiting.  No diarrhea.  No constipation. Genitourinary: Negative for dysuria. Musculoskeletal: Negative for back pain. Skin: Negative for rash. Neurological: Negative for headaches. No focal numbness, tingling or weakness.   10-point ROS otherwise negative.  ____________________________________________   PHYSICAL EXAM:  VITAL SIGNS: ED Triage Vitals  Enc Vitals Group     BP 11/11/15 1805 (!) 142/79     Pulse Rate 11/11/15 1805 65     Resp 11/11/15 1805 18     Temp 11/11/15 1805 97.7 F (36.5 C)      Temp Source 11/11/15 1805 Oral     SpO2 11/11/15 1805 98 %     Weight 11/11/15 1806 218 lb 9.6 oz (99.2 kg)     Height 11/11/15 1806 5\' 11"  (1.803 m)     Head Circumference --      Peak Flow --      Pain Score 11/11/15 1806 0     Pain Loc --      Pain Edu? --      Excl. in GC? --     Constitutional: Alert and oriented To person, which is baseline. Well appearing and in no acute distress. Answers questions appropriately. Eyes: Conjunctivae are normal.  EOMI. No scleral icterus. PERRLA. No horizontal or vertical nystagmus. Head: 2 x 2 centimeter contusion with minimal abrasion over the central forehead, 1.5 x 1 cm contusion on the top of the scalp as well. No  midface instability. Nose: No congestion/rhinnorhea.No swelling of the nose or septal hematoma.. Mouth/Throat: Mucous membranes are moist. No dental injury or malocclusion. Neck: No stridor.  Supple.  No midline C-spine tenderness to palpation, step-offs or deformities. Cardiovascular: Irregular rate, regular rhythm. No murmurs, rubs or gallops.  Respiratory: Normal respiratory effort.  No accessory muscle use or retractions. Lungs CTAB.  No wheezes, rales or ronchi. Gastrointestinal: Soft, nontender and mildly distended.  No guarding or rebound.  No peritoneal signs. Musculoskeletal: No LE edema. No midline thoracic or lumbar spine tenderness to palpation, step-offs or deformities. Neurologic: Alert and oriented to person but not year  Speech is clear.  Face and smile are symmetric.  EOMI.  Moves all extremities well. Skin:  Skin is warm, dry. Two moon-shaped skin tears which are approximate 2 cm long on the right wrist, in the left hand. No rash noted. Psychiatric: Mood and affect are normal. Speech and behavior are normal.  Normal judgement.  ____________________________________________   LABS (all labs ordered are listed, but only abnormal results are displayed)  Labs Reviewed  CBC - Abnormal; Notable for the following:        Result Value   RBC 4.33 (*)    HCT 39.4 (*)    RDW 14.6 (*)    All other components within normal limits  BASIC METABOLIC PANEL - Abnormal; Notable for the following:    Sodium 133 (*)    Chloride 98 (*)    Glucose, Bld 102 (*)    Calcium 8.8 (*)    Anion gap 4 (*)    All other components within normal limits   ____________________________________________  EKG  ED ECG REPORT I, Rockne Menghini, the attending physician, personally viewed and interpreted this ECG.   Date: 11/11/2015  EKG Time: 1847  Rate: 66  Rhythm: normal sinus rhythm  Axis: leftward  Intervals:first degree block  ST&T Change: No STEMI  ____________________________________________  RADIOLOGY  Ct Head Wo Contrast  Result Date: 11/11/2015 CLINICAL DATA:  Pain after fall EXAM: CT HEAD WITHOUT CONTRAST CT CERVICAL SPINE WITHOUT CONTRAST TECHNIQUE: Multidetector CT imaging of the head and cervical spine was performed following the standard protocol without intravenous contrast. Multiplanar CT image reconstructions of the cervical spine were also generated. COMPARISON:  CT scan of the brain October 05, 2015 and CT of the cervical spine February 12, 2011 FINDINGS: CT HEAD FINDINGS Brain: Dilatation of the ventricles is stable. No mass, mass effect, or midline shift. Scattered white matter changes with no acute ischemia or infarct. Cerebellum, brainstem, and basal cisterns are normal. No subdural, epidural, or subarachnoid hemorrhage. Vascular: Vascular calcifications are seen within the intracranial portions of the carotid artery. Skull: Normal. Negative for fracture or focal lesion. Sinuses/Orbits: No acute finding. Other: No other abnormalities. CT CERVICAL SPINE FINDINGS Alignment: No significant malalignment. Skull base and vertebrae: No fractures. Multilevel degenerative changes. Soft tissues and spinal canal: No acute abnormalities. Upper chest: Mild thickening in the lateral aspect of the right lung apex,  incompletely characterized but probably scarring. A chest x-ray could better evaluate this region. No other acute abnormalities IMPRESSION: 1. No acute intracranial process. 2. No fracture or traumatic malalignment in the cervical spine. Electronically Signed   By: Gerome Sam III M.D   On: 11/11/2015 19:31   Ct Cervical Spine Wo Contrast  Result Date: 11/11/2015 CLINICAL DATA:  Pain after fall EXAM: CT HEAD WITHOUT CONTRAST CT CERVICAL SPINE WITHOUT CONTRAST TECHNIQUE: Multidetector CT imaging of the head and cervical spine was  performed following the standard protocol without intravenous contrast. Multiplanar CT image reconstructions of the cervical spine were also generated. COMPARISON:  CT scan of the brain October 05, 2015 and CT of the cervical spine February 12, 2011 FINDINGS: CT HEAD FINDINGS Brain: Dilatation of the ventricles is stable. No mass, mass effect, or midline shift. Scattered white matter changes with no acute ischemia or infarct. Cerebellum, brainstem, and basal cisterns are normal. No subdural, epidural, or subarachnoid hemorrhage. Vascular: Vascular calcifications are seen within the intracranial portions of the carotid artery. Skull: Normal. Negative for fracture or focal lesion. Sinuses/Orbits: No acute finding. Other: No other abnormalities. CT CERVICAL SPINE FINDINGS Alignment: No significant malalignment. Skull base and vertebrae: No fractures. Multilevel degenerative changes. Soft tissues and spinal canal: No acute abnormalities. Upper chest: Mild thickening in the lateral aspect of the right lung apex, incompletely characterized but probably scarring. A chest x-ray could better evaluate this region. No other acute abnormalities IMPRESSION: 1. No acute intracranial process. 2. No fracture or traumatic malalignment in the cervical spine. Electronically Signed   By: Gerome Sam III M.D   On: 11/11/2015 19:31     ____________________________________________   PROCEDURES  Procedure(s) performed: None  Procedures  Critical Care performed: No ____________________________________________   INITIAL IMPRESSION / ASSESSMENT AND PLAN / ED COURSE  Pertinent labs & imaging results that were available during my care of the patient were reviewed by me and considered in my medical decision making (see chart for details).  82 y.o. male on Eliquis for afib with a fall today and evidence of head trauma. Overall, the patient and his family reports that he is at baseline mental status. He has not had any headache, visual changes, speech changes or changes in mental status, however, he is on our request and does have evidence of injury to the forehead and the scalp, so a get a CT scan to rule out any intracranial injury. The most likely etiology of the patient's fall is that he is no longer mobile without machine assistance, and attempted to get up by himself, losing his balance. I do not see any evidence of acute stroke on my examination. We'll get a EKG to evaluate for arrhythmia greater than his baseline A. fib. Will get basic laboratory studies as well. If his workup is reassuring in the emergency department, will use Neosporin for his skin tears, and anticipate discharge home.  ----------------------------------------- 9:25 PM on 11/11/2015 -----------------------------------------  The patient's workup in the emergency department has been reassuring. He does not have any ischemic changes on his EKG, and his trauma evaluation including CT of the head and neck do not show any acute injury. Allene Dillon studies are also reassuring. At this time, the patient is feeling well, and his skin tears have been cleaned and treated with Neosporin. We'll plan to discharge him home. He and his family understand return precautions as well as follow-up instructions.  ____________________________________________  FINAL CLINICAL  IMPRESSION(S) / ED DIAGNOSES  Final diagnoses:  Fall, initial encounter  Contusion of forehead, initial encounter  Contusion of scalp, initial encounter  Multiple skin tears    Clinical Course      NEW MEDICATIONS STARTED DURING THIS VISIT:  New Prescriptions   No medications on file      Rockne Menghini, MD 11/11/15 2127

## 2015-11-11 NOTE — ED Triage Notes (Signed)
Pt arrived via EMS from home s/p unwitnessed fall. Pt lives at home with 24 hour care and was asleep in a chair. Pt's caregiver left the room to wash their hands when he tried to get up and caregiver heard a thud.  Pt has skin tears to hands and hematoma to forehead and left side of head. No bleeding from head at this time. Hands wrapped in gauze.  Pt has no hx of dementia but has some memory impairment per family. Pt takes Eliquis 5mg  BID and ASA 81 daily along with Keppra 500mg  BID.  Pt is alert and talking is at his baseline.

## 2015-11-11 NOTE — Discharge Instructions (Signed)
You may apply Neosporin, or any triple antibiotic cream, and a thick coat 3 times daily for your skin tears. Continue to monitor your skin tears for any evidence of infection, including pus, swelling, pain, or surrounding redness.  Return to the emergency department for severe pain, headache, vomiting, changes in mental status, numbness tingling or weakness, or any other symptoms concerning to you.

## 2015-11-11 NOTE — ED Notes (Signed)
Patient moved to 12H due to trauma incoming.  EMS called for transport and med necessity given to Secretary.

## 2015-11-16 ENCOUNTER — Telehealth: Payer: Self-pay | Admitting: Family Medicine

## 2015-11-16 NOTE — Telephone Encounter (Signed)
Most of the time URIs are viral- how long has he been sick? If it's less than a week, he should wait until Monday and use OTC medications and let us know if he's not getting better.

## 2015-11-16 NOTE — Telephone Encounter (Signed)
Pts daughter called stating pt appears to be getting a URI.  She states he is immobile and would prefer to not bring him in.  I did explain Dr Dossie Arbourrissman wasn't here so he prob would need an appt but that I would send a message asking.  Please call pt's daughter to advise.

## 2015-11-16 NOTE — Telephone Encounter (Signed)
Called patient.s daughter to relay msg from MJ.  She stated he could not take antihistamines because of other meds, has been taking Mucinex over a month for cough.  He was acting lethargic and wanted to catch this before it was emergent.    MJ confirmed no ABX to be called in if symptoms less than 1 week and to call back Monday if worse or no improvement.

## 2015-11-16 NOTE — Telephone Encounter (Signed)
Patient's daughter will call Monday with update

## 2015-11-18 ENCOUNTER — Other Ambulatory Visit: Payer: Self-pay | Admitting: Family Medicine

## 2015-11-20 ENCOUNTER — Telehealth: Payer: Self-pay

## 2015-11-20 NOTE — Telephone Encounter (Signed)
They have to get this from the cardiologist this is not a prescription we do.

## 2015-11-20 NOTE — Telephone Encounter (Signed)
[facility]   Amiodarone HCL 200mg  TAb

## 2015-11-22 ENCOUNTER — Encounter: Payer: Self-pay | Admitting: Family Medicine

## 2015-11-22 ENCOUNTER — Ambulatory Visit (INDEPENDENT_AMBULATORY_CARE_PROVIDER_SITE_OTHER): Payer: Medicare Other | Admitting: Family Medicine

## 2015-11-22 ENCOUNTER — Telehealth: Payer: Self-pay | Admitting: Family Medicine

## 2015-11-22 VITALS — BP 108/67 | HR 60 | Temp 97.7°F

## 2015-11-22 DIAGNOSIS — I482 Chronic atrial fibrillation, unspecified: Secondary | ICD-10-CM

## 2015-11-22 DIAGNOSIS — J019 Acute sinusitis, unspecified: Secondary | ICD-10-CM | POA: Diagnosis not present

## 2015-11-22 MED ORDER — LEVETIRACETAM 500 MG PO TABS: 500.0000 mg | ORAL_TABLET | Freq: Two times a day (BID) | ORAL | 3 refills | Status: DC

## 2015-11-22 MED ORDER — CEFUROXIME AXETIL 500 MG PO TABS: 500.0000 mg | ORAL_TABLET | Freq: Two times a day (BID) | ORAL | 0 refills | Status: DC

## 2015-11-22 NOTE — Telephone Encounter (Signed)
Pts daughter called stated she has a question about the medication he was prescribed today (Ceftin). Please call her back before lunch as she has to go to work this afternoon. Please call ASAP. Thanks.

## 2015-11-22 NOTE — Progress Notes (Signed)
BP 108/67 (BP Location: Right Arm, Patient Position: Sitting, Cuff Size: Normal)   Pulse 60   Temp 97.7 F (36.5 C)   SpO2 97%    Subjective:    Patient ID: [name] [name], male    DOB: 03/28/1921, 82 y.o.   MRN: [id number]  HPI: [name] Palmerlbert L Gockel is a 82 y.o. male  Chief Complaint  Patient presents with  . URI  . Cough    Can be severe at times  . Weakness  . Fall    2 weeks ago  . Hospitalization Follow-up    From Fall  . Medication Refill  Patient accompanied by daughter who provides history ER follow-up for fall has skin tears on both hands which are healing well reviewed no mental decline no other extra bruising Has done well after fall.  Biggest concern today is continued cough and weakness. Cough is been ongoing for over a month now sometimes productive sometimes not sometimes wheezing no fever  Relevant past medical, surgical, family and social history reviewed and updated as indicated. Interim medical history since our last visit reviewed. Allergies and medications reviewed and updated.  Review of Systems  Constitutional: Positive for activity change and fever.  Respiratory: Positive for cough and wheezing.   Cardiovascular: Positive for leg swelling. Negative for chest pain and palpitations.    Per HPI unless specifically indicated above     Objective:    BP 108/67 (BP Location: Right Arm, Patient Position: Sitting, Cuff Size: Normal)   Pulse 60   Temp 97.7 F (36.5 C)   SpO2 97%   Wt Readings from Last 3 Encounters:  11/11/15 218 lb 9.6 oz (99.2 kg)  10/08/15 196 lb 5 oz (89 kg)  10/02/15 196 lb 8 oz (89.1 kg)    Physical Exam  Constitutional: He is oriented to person, place, and time. He appears well-developed and well-nourished. No distress.  HENT:  Head: Normocephalic and atraumatic.  Right Ear: Hearing normal.  Left Ear: Hearing normal.  Nose: Nose normal.  Eyes: Conjunctivae and lids are normal. Right eye exhibits no discharge. Left eye  exhibits no discharge. No scleral icterus.  Cardiovascular: Normal heart sounds.   Pulmonary/Chest: Effort normal. No respiratory distress. He has wheezes.  Occasional rhonchi  Musculoskeletal: Normal range of motion.  Neurological: He is alert and oriented to person, place, and time.  Skin: Skin is intact. No rash noted.  Psychiatric: He has a normal mood and affect. His speech is normal and behavior is normal. Judgment and thought content normal. Cognition and memory are normal.    Results for orders placed or performed during the hospital encounter of 11/11/15  CBC  Result Value Ref Range   WBC 7.3 3.8 - 10.6 K/uL   RBC 4.33 (L) 4.40 - 5.90 MIL/uL   Hemoglobin 13.6 13.0 - 18.0 g/dL   HCT 30.839.4 (L) 65.740.0 - 84.652.0 %   MCV 90.9 80.0 - 100.0 fL   MCH 31.3 26.0 - 34.0 pg   MCHC 34.5 32.0 - 36.0 g/dL   RDW 96.214.6 (H) 95.211.5 - 84.114.5 %   Platelets 193 150 - 440 K/uL  Basic metabolic panel  Result Value Ref Range   Sodium 133 (L) 135 - 145 mmol/L   Potassium 4.8 3.5 - 5.1 mmol/L   Chloride 98 (L) 101 - 111 mmol/L   CO2 31 22 - 32 mmol/L   Glucose, Bld 102 (H) 65 - 99 mg/dL   BUN 16 6 - 20 mg/dL  Creatinine, Ser 0.99 0.61 - 1.24 mg/dL   Calcium 8.8 (L) 8.9 - 10.3 mg/dL   GFR calc non Af Amer >60 >60 mL/min   GFR calc Af Amer >60 >60 mL/min   Anion gap 4 (L) 5 - 15      Assessment & Plan:   Problem List Items Addressed This Visit      Cardiovascular and Mediastinum   A-fib (HCC)    Reviewed atrial fibrillation and use of medications that refills must come from cardiology       Other Visit Diagnoses    Acute sinusitis, recurrence not specified, unspecified location    -  Primary   Bronchitis sinusitis on review of drug interactions and allergies his taken Ceftin in the past without penicillin allergies will use that   Relevant Medications   cefUROXime (CEFTIN) 500 MG tablet       Follow up plan: Return for As scheduled.

## 2015-11-22 NOTE — Telephone Encounter (Signed)
Confirmed with CW that if patient was ok taking Cefdinir, he could take Ceftin

## 2015-11-22 NOTE — Assessment & Plan Note (Signed)
Reviewed atrial fibrillation and use of medications that refills must come from cardiology

## 2015-11-30 ENCOUNTER — Telehealth: Payer: Self-pay

## 2015-11-30 MED ORDER — ALBUTEROL SULFATE (2.5 MG/3ML) 0.083% IN NEBU: 2.5000 mg | INHALATION_SOLUTION | Freq: Four times a day (QID) | RESPIRATORY_TRACT | 6 refills | Status: DC | PRN

## 2015-11-30 NOTE — Telephone Encounter (Signed)
Patient's daughter Luster LandsbergRenee' notified and agreed to the plan.

## 2015-11-30 NOTE — Telephone Encounter (Signed)
Will just sent in regular albuterol nebulizer solution as that is covered, and give spiriva samples until we can get inhaler covered.

## 2015-11-30 NOTE — Telephone Encounter (Signed)
Will you change patient's Duoneb solution to something else?  Insurance will not cover because it is not being giving in a long-term facility or hospice. Patient only has part b coverage, not part d which is why they won't pay for it without being in a facility.   Duoneb was drug tier 2.  Normal albuterol solution is the only solution that is drug tier 1.

## 2015-12-19 ENCOUNTER — Other Ambulatory Visit: Payer: Self-pay

## 2015-12-20 ENCOUNTER — Ambulatory Visit (INDEPENDENT_AMBULATORY_CARE_PROVIDER_SITE_OTHER): Payer: Medicare Other | Admitting: Family Medicine

## 2015-12-20 ENCOUNTER — Encounter: Payer: Self-pay | Admitting: Family Medicine

## 2015-12-20 VITALS — BP 120/65 | HR 51 | Temp 98.3°F

## 2015-12-20 DIAGNOSIS — I2510 Atherosclerotic heart disease of native coronary artery without angina pectoris: Secondary | ICD-10-CM | POA: Diagnosis not present

## 2015-12-20 DIAGNOSIS — E7800 Pure hypercholesterolemia, unspecified: Secondary | ICD-10-CM | POA: Diagnosis not present

## 2015-12-20 DIAGNOSIS — R609 Edema, unspecified: Secondary | ICD-10-CM | POA: Diagnosis not present

## 2015-12-20 DIAGNOSIS — I482 Chronic atrial fibrillation, unspecified: Secondary | ICD-10-CM

## 2015-12-20 DIAGNOSIS — Z0000 Encounter for general adult medical examination without abnormal findings: Secondary | ICD-10-CM

## 2015-12-20 DIAGNOSIS — R569 Unspecified convulsions: Secondary | ICD-10-CM

## 2015-12-20 MED ORDER — APIXABAN 5 MG PO TABS: 5.0000 mg | ORAL_TABLET | Freq: Two times a day (BID) | ORAL | 4 refills | Status: DC

## 2015-12-20 NOTE — Progress Notes (Signed)
BP 120/65   Pulse (!) 51   Temp 98.3 F (36.8 C)   SpO2 98%    Subjective:    Patient ID: [name] [name], male    DOB: 09/09/1921, 82 y.o.   MRN: [id number]  HPI: [name] [name] is a 82 y.o. male  Chief Complaint  Patient presents with  . Annual Exam  Patient accompanied by his daughter who provides history and his wife who had an exam also. Patient all in all doing okay has recovered from bronchitis. Still coughing some not taking a very deep breath Has a hospital bed now and distal edema has improved greatly. Taking medication for seizures up without problems no further seizure episodes. Eliquis with no serious bleeding or bruising Heart medicine cardiac status is stable  Relevant past medical, surgical, family and social history reviewed and updated as indicated. Interim medical history since our last visit reviewed. Allergies and medications reviewed and updated.  Review of Systems  Per HPI unless specifically indicated above     Objective:    BP 120/65   Pulse (!) 51   Temp 98.3 F (36.8 C)   SpO2 98%   Wt Readings from Last 3 Encounters:  11/11/15 218 lb 9.6 oz (99.2 kg)  10/08/15 196 lb 5 oz (89 kg)  10/02/15 196 lb 8 oz (89.1 kg)    Physical Exam  Results for orders placed or performed during the hospital encounter of 11/11/15  CBC  Result Value Ref Range   WBC 7.3 3.8 - 10.6 K/uL   RBC 4.33 (L) 4.40 - 5.90 MIL/uL   Hemoglobin 13.6 13.0 - 18.0 g/dL   HCT 95.639.4 (L) 21.340.0 - 08.652.0 %   MCV 90.9 80.0 - 100.0 fL   MCH 31.3 26.0 - 34.0 pg   MCHC 34.5 32.0 - 36.0 g/dL   RDW 57.814.6 (H) 46.911.5 - 62.914.5 %   Platelets 193 150 - 440 K/uL  Basic metabolic panel  Result Value Ref Range   Sodium 133 (L) 135 - 145 mmol/L   Potassium 4.8 3.5 - 5.1 mmol/L   Chloride 98 (L) 101 - 111 mmol/L   CO2 31 22 - 32 mmol/L   Glucose, Bld 102 (H) 65 - 99 mg/dL   BUN 16 6 - 20 mg/dL   Creatinine, Ser 5.280.99 0.61 - 1.24 mg/dL   Calcium 8.8 (L) 8.9 - 10.3 mg/dL   GFR calc non Af Amer  >60 >60 mL/min   GFR calc Af Amer >60 >60 mL/min   Anion gap 4 (L) 5 - 15      Assessment & Plan:   Problem List Items Addressed This Visit      Cardiovascular and Mediastinum   CAD (coronary artery disease)   Relevant Medications   apixaban (ELIQUIS) 5 MG TABS tablet   Other Relevant Orders   Comprehensive metabolic panel   Lipid panel   CBC with Differential/Platelet   TSH   A-fib (HCC)    The current medical regimen is effective;  continue present plan and medications.       Relevant Medications   apixaban (ELIQUIS) 5 MG TABS tablet   Other Relevant Orders   Comprehensive metabolic panel   Lipid panel   CBC with Differential/Platelet   TSH     Other   Hyperlipidemia   Relevant Medications   apixaban (ELIQUIS) 5 MG TABS tablet   Other Relevant Orders   Comprehensive metabolic panel   Lipid panel   CBC with Differential/Platelet  TSH   Edema    Improved with hospital bed      Seizure [facility])    The current medical regimen is effective;  continue present plan and medications.       Relevant Orders   Comprehensive metabolic panel   Lipid panel   CBC with Differential/Platelet   TSH    Other Visit Diagnoses    PE (physical exam), annual    -  Primary   Relevant Orders   Comprehensive metabolic panel   Lipid panel   CBC with Differential/Platelet   TSH       Follow up plan: Return in about 6 months (around 06/19/2016) for BMP, CBC.

## 2015-12-20 NOTE — Assessment & Plan Note (Signed)
Improved with hospital bed

## 2015-12-20 NOTE — Assessment & Plan Note (Signed)
The current medical regimen is effective;  continue present plan and medications.  

## 2015-12-21 ENCOUNTER — Encounter: Payer: Self-pay | Admitting: Family Medicine

## 2015-12-21 LAB — COMPREHENSIVE METABOLIC PANEL
A/G RATIO: 1.3 (ref 1.2–2.2)
ALBUMIN: 3.3 g/dL (ref 3.2–4.6)
ALT: 16 IU/L (ref 0–44)
AST: 22 IU/L (ref 0–40)
Alkaline Phosphatase: 69 IU/L (ref 39–117)
BUN / CREAT RATIO: 23 (ref 10–24)
BUN: 16 mg/dL (ref 10–36)
Bilirubin Total: 0.3 mg/dL (ref 0.0–1.2)
CALCIUM: 8.9 mg/dL (ref 8.6–10.2)
CO2: 29 mmol/L (ref 18–29)
Chloride: 96 mmol/L (ref 96–106)
Creatinine, Ser: 0.69 mg/dL — ABNORMAL LOW (ref 0.76–1.27)
GFR, EST AFRICAN AMERICAN: 96 mL/min/{1.73_m2} (ref >59)
GFR, EST NON AFRICAN AMERICAN: 83 mL/min/{1.73_m2} (ref >59)
GLOBULIN, TOTAL: 2.6 g/dL (ref 1.5–4.5)
Glucose: 102 mg/dL — ABNORMAL HIGH (ref 65–99)
POTASSIUM: 4.8 mmol/L (ref 3.5–5.2)
SODIUM: 138 mmol/L (ref 134–144)
TOTAL PROTEIN: 5.9 g/dL — AB (ref 6.0–8.5)

## 2015-12-21 LAB — CBC WITH DIFFERENTIAL/PLATELET
BASOS: 0 %
Basophils Absolute: 0 10*3/uL (ref 0.0–0.2)
EOS (ABSOLUTE): 0.3 10*3/uL (ref 0.0–0.4)
EOS: 4 %
HEMATOCRIT: 36.4 % — AB (ref 37.5–51.0)
Hemoglobin: 12 g/dL — ABNORMAL LOW (ref 12.6–17.7)
IMMATURE GRANULOCYTES: 0 %
Immature Grans (Abs): 0 10*3/uL (ref 0.0–0.1)
LYMPHS ABS: 1.2 10*3/uL (ref 0.7–3.1)
LYMPHS: 13 %
MCH: 29.6 pg (ref 26.6–33.0)
MCHC: 33 g/dL (ref 31.5–35.7)
MCV: 90 fL (ref 79–97)
MONOCYTES: 6 %
MONOS ABS: 0.5 10*3/uL (ref 0.1–0.9)
NEUTROS ABS: 7.2 10*3/uL — AB (ref 1.4–7.0)
Neutrophils: 77 %
Platelets: 252 10*3/uL (ref 150–379)
RBC: 4.06 x10E6/uL — ABNORMAL LOW (ref 4.14–5.80)
RDW: 13.9 % (ref 12.3–15.4)
WBC: 9.4 10*3/uL (ref 3.4–10.8)

## 2015-12-21 LAB — LIPID PANEL
CHOL/HDL RATIO: 3.1 ratio (ref 0.0–5.0)
Cholesterol, Total: 128 mg/dL (ref 100–199)
HDL: 41 mg/dL (ref >39)
LDL CALC: 65 mg/dL (ref 0–99)
Triglycerides: 110 mg/dL (ref 0–149)
VLDL Cholesterol Cal: 22 mg/dL (ref 5–40)

## 2015-12-21 LAB — TSH: TSH: 3.15 u[IU]/mL (ref 0.450–4.500)

## 2015-12-22 ENCOUNTER — Other Ambulatory Visit: Payer: Medicare Other

## 2015-12-22 DIAGNOSIS — R8290 Unspecified abnormal findings in urine: Secondary | ICD-10-CM

## 2015-12-22 LAB — URINALYSIS, ROUTINE W REFLEX MICROSCOPIC
BILIRUBIN UA: NEGATIVE
GLUCOSE, UA: NEGATIVE
KETONES UA: NEGATIVE
Leukocytes, UA: NEGATIVE
NITRITE UA: NEGATIVE
Protein, UA: NEGATIVE
RBC, UA: NEGATIVE
Specific Gravity, UA: 1.02 (ref 1.005–1.030)
UUROB: 1 mg/dL (ref 0.2–1.0)
pH, UA: 6 (ref 5.0–7.5)

## 2015-12-25 ENCOUNTER — Encounter: Payer: Self-pay | Admitting: Family Medicine

## 2015-12-26 ENCOUNTER — Other Ambulatory Visit: Payer: Self-pay | Admitting: Family Medicine

## 2015-12-26 NOTE — Telephone Encounter (Signed)
Routing to provider  

## 2015-12-28 ENCOUNTER — Encounter: Payer: Self-pay | Admitting: Family Medicine

## 2015-12-28 DIAGNOSIS — D649 Anemia, unspecified: Secondary | ICD-10-CM

## 2016-01-02 ENCOUNTER — Emergency Department: Payer: Medicare Other

## 2016-01-02 ENCOUNTER — Encounter: Payer: Self-pay | Admitting: Emergency Medicine

## 2016-01-02 ENCOUNTER — Emergency Department
Admission: EM | Admit: 2016-01-02 | Discharge: 2016-01-02 | Disposition: A | Discharge status: Home / Self Care | Payer: Medicare Other | Attending: Emergency Medicine | Admitting: Emergency Medicine

## 2016-01-02 DIAGNOSIS — R600 Localized edema: Secondary | ICD-10-CM | POA: Insufficient documentation

## 2016-01-02 DIAGNOSIS — Z7982 Long term (current) use of aspirin: Secondary | ICD-10-CM | POA: Diagnosis not present

## 2016-01-02 DIAGNOSIS — Z951 Presence of aortocoronary bypass graft: Secondary | ICD-10-CM | POA: Diagnosis not present

## 2016-01-02 DIAGNOSIS — M7989 Other specified soft tissue disorders: Secondary | ICD-10-CM | POA: Diagnosis present

## 2016-01-02 DIAGNOSIS — I2510 Atherosclerotic heart disease of native coronary artery without angina pectoris: Secondary | ICD-10-CM | POA: Diagnosis not present

## 2016-01-02 DIAGNOSIS — R609 Edema, unspecified: Secondary | ICD-10-CM

## 2016-01-02 DIAGNOSIS — Z91040 Latex allergy status: Secondary | ICD-10-CM | POA: Insufficient documentation

## 2016-01-02 DIAGNOSIS — Z85828 Personal history of other malignant neoplasm of skin: Secondary | ICD-10-CM | POA: Insufficient documentation

## 2016-01-02 DIAGNOSIS — Z87891 Personal history of nicotine dependence: Secondary | ICD-10-CM | POA: Diagnosis not present

## 2016-01-02 DIAGNOSIS — I5022 Chronic systolic (congestive) heart failure: Secondary | ICD-10-CM | POA: Insufficient documentation

## 2016-01-02 NOTE — ED Triage Notes (Addendum)
Pt presents to ED with reports of right hand swelling and redness. Pt awoke this morning with hand large, swollen and red. Pt reports swelling has gone down some. Pt denies any injury. Pt denies any insect bite to hand. Pt denies pain. Pt hand cool to touch.

## 2016-01-02 NOTE — ED Provider Notes (Signed)
[facility] Emergency Department Provider Note   ____________________________________________    I have reviewed the triage vital signs and the nursing notes.   HISTORY  Chief Complaint Right hand swelling     HPI [name] [name] is a 82 y.o. male who presents today because of swelling in his right hand. Apparently he woke up and his right hand was significantly more swollen than his left hand. He denies any pain in the right hand. Daughter reports is swelling has improved as they has gone on. No redness or fever reported. No chest pain or shortness of breath. Daughter is concerned about a blood clot. This is never happened before although the patient does have bilateral lower extremity edema.   Past Medical History:  Diagnosis Date  . A-fib (HCC)   . Bradycardia    06/2010  . CAD (coronary artery disease)    nuclear 01/2008, no ischemia, EF 60%, distal anterior scar  . Carotid artery disease (HCC)    doppler 05/2009,  0-39% bilateral / doppler 06/2010 OK  . Edema    Ankle edema August, 2012  . Ejection fraction    EF 45%, echo,05/2009,apical hypo,  ?? posterior and lateral hypo  . Elevated CPK    11/23/08.Marland Kitchen.Marland Kitchen.probably from debris from popliteal aneurysm  . Fall at home July 2015  . Hx of CABG    1998  . Hyperlipidemia   . Intracranial hemorrhage (HCC)    Two small subependymal bleeds 06/2010,,ASA held  . PAD (peripheral artery disease) (HCC)    Above the knee to below the knee popliteal bypass,, right  //    Doppler,  July, 2013, patent  . Popliteal aneurysm (HCC)    .repair..11/25/2008  Dr. Darrick PennaFields  . Prominent abdominal aortic pulsation    doppler normal  . Speech problem    Neurologic event with brief infusion and speech difficulty.... January, 2011.... being assessed by neurology  February, 2011  . Syncope    .remote..no etiology  /  syncope 11/2008.. from acute leg pain  . TIA (transient ischemic attack)    Brief episode of confusion and  speech difficulty, January, 2012  /  recurrent confusion speech difficulty September, 2012  . Venous insufficiency    support hose    Patient Active Problem List   Diagnosis Date Noted  . Seizure (HCC) 10/05/2015  . A-fib (HCC) 10/05/2015  . Chronic systolic (congestive) heart failure 09/07/2015  . Altered mental status 08/18/2015  . CAD in native artery 08/07/2015  . Arterial disease (HCC) 08/07/2015  . HLD (hyperlipidemia) 08/07/2015  . Temporary cerebral vascular dysfunction 08/07/2015  . Chronic venous insufficiency 08/07/2015  . H/O neoplasm 08/22/2014  . H/O malignant neoplasm of skin 08/22/2014  . 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 Peripheral vascular disease (HCC) 02/28/2014  . Postsurgical aortocoronary bypass status 02/28/2014  . Syncope and collapse 02/28/2014  . 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 PAD (peripheral artery disease) (HCC)   . TIA (transient ischemic attack)   . Edema   . Hyperlipidemia   . Syncope   . CAD (coronary artery disease)   . Venous insufficiency   . Carotid artery disease (HCC)   . Elevated CPK   . Popliteal aneurysm (HCC)   . Hx of CABG   . Intracranial hemorrhage [facility](HCC)     Past Surgical History:  Procedure Laterality Date  . CORONARY ARTERY BYPASS GRAFT  1998  .  popliteal bypass  with reversed greater saphenous vein from right leg.      Prior to Admission medications   Medication Sig Start Date End Date Taking? Authorizing Provider  acetaminophen (TYLENOL) 325 MG tablet Take 2 tablets (650 mg total) by mouth every 6 (six) hours as needed for mild pain (or Fever >/= 101). 10/08/15   Ramonita Lab, MD  albuterol (PROVENTIL) (2.5 MG/3ML) 0.083% nebulizer solution Take 3 mLs (2.5 mg total) by nebulization every 6 (six) hours as needed for wheezing or shortness of breath. 11/30/15   Particia Nearing, PA-C  amiodarone (PACERONE) 200 MG tablet  TAKE 1 TABLET BY MOUTH ONCE A DAY Patient taking differently: TAKE 1 TABLET BY MOUTH ONCE A DAY IN THE MORNING. 10/31/15   Particia Nearing, PA-C  apixaban (ELIQUIS) 5 MG TABS tablet Take 1 tablet (5 mg total) by mouth 2 (two) times daily. 12/20/15   Steele Sizer, MD  aspirin EC 81 MG EC tablet Take 1 tablet (81 mg total) by mouth daily. Patient taking differently: Take 81 mg by mouth every morning.  10/08/15   Ramonita Lab, MD  benzonatate (TESSALON) 100 MG capsule TAKE ONE CAPSULE BY MOUTH EVERY 8 HOURS AS NEEDED COUGH 12/26/15   Steele Sizer, MD  calcium carbonate (TUMS - DOSED IN MG ELEMENTAL CALCIUM) 500 MG chewable tablet Chew 2 tablets by mouth daily with supper.     Historical Provider, MD  docusate sodium (COLACE) 100 MG capsule Take 100 mg by mouth 2 (two) times daily.     Historical Provider, MD  fluticasone (FLONASE) 50 MCG/ACT nasal spray Place 1 spray into both nostrils daily. 08/22/15   Auburn Bilberry, MD  guaiFENesin (MUCINEX) 600 MG 12 hr tablet Take 1 tablet (600 mg total) by mouth 2 (two) times daily. 08/22/15   Auburn Bilberry, MD  levETIRAcetam (KEPPRA) 500 MG tablet Take 1 tablet (500 mg total) by mouth 2 (two) times daily. 11/22/15   Steele Sizer, MD  Multiple Vitamin (MULTIVITAMIN WITH MINERALS) TABS tablet Take 1 tablet by mouth daily with breakfast.     Historical Provider, MD     Allergies Penicillins; Latex; and Sulfonamide derivatives  Family History  Problem Relation Age of Onset  . Stroke Mother   . Diabetes Mother   . Diabetes Sister     Social History Social History  Substance Use Topics  . Smoking status: Former Smoker    Years: 20.00    Types: Cigarettes    Quit date: 09/12/1946  . Smokeless tobacco: Never Used     Comment: quit appox 50 yrs ago  . Alcohol use No    Review of Systems  Constitutional: No fever/chills   Cardiovascular: Denies chest pain. Respiratory: Denies shortness of breath.   Musculoskeletal: Negative for back  pain. Skin: Negative for rash.   10-point ROS otherwise negative.  ____________________________________________   PHYSICAL EXAM:  VITAL SIGNS: ED Triage Vitals [01/02/16 1336]  Enc Vitals Group     BP 109/60     Pulse Rate 63     Resp 18     Temp 97.6 F (36.4 C)     Temp Source Oral     SpO2 96 %     Weight 215 lb (97.5 kg)     Height 5\' 11"  (1.803 m)     Head Circumference      Peak Flow      Pain Score      Pain Loc  Pain Edu?      Excl. in GC?     Constitutional: Alert and oriented. No acute distress.  Eyes: Conjunctivae are normal.   Nose: No congestion/rhinnorhea. Mouth/Throat: Mucous membranes are moist.    Cardiovascular: Normal rate, regular rhythm. Grossly normal heart sounds.  Good peripheral circulation. Respiratory: Normal respiratory effort.  No retractions. Lungs CTAB.  Genitourinary: deferred Musculoskeletal: Bilateral lower extremity edema.  Warm and well perfused Right hand, mild edema noted, no tenderness to palpation, full range of motion, 2+ cap refill, 2+ radial and ulna pulses. No evidence of cellulitis or infection. Neurologic:  Normal speech and language. No gross focal neurologic deficits are appreciated.  Skin:  Skin is warm, dry and intact. No rash noted. Psychiatric: Mood and affect are normal. Speech and behavior are normal.  ____________________________________________   LABS (all labs ordered are listed, but only abnormal results are displayed)  Labs Reviewed - No data to display ____________________________________________  EKG  None ____________________________________________  RADIOLOGY  No DVT, cystic structure noted and discussed with daughter ____________________________________________   PROCEDURES  Procedure(s) performed: No    Critical Care performed: No ____________________________________________   INITIAL IMPRESSION / ASSESSMENT AND PLAN / ED COURSE  Pertinent labs & imaging results that were  available during my care of the patient were reviewed by me and considered in my medical decision making (see chart for details).  Patient well-appearing and in no distress. Mild amount of edema on the right hand, I suspect this is dependent peripheral edema. Patient had lab work done 2 weeks ago which was unremarkable. We will obtain ultrasound to rule out DVT  Clinical Course   ____________________________________________   FINAL CLINICAL IMPRESSION(S) / ED DIAGNOSES  Final diagnoses:  Swelling  Peripheral edema      NEW MEDICATIONS STARTED DURING THIS VISIT:  New Prescriptions   No medications on file     Note:  This document was prepared using Dragon voice recognition software and may include unintentional dictation errors.    Jene Everyobert Sequoyah Ramone, MD 01/02/16 [phone]

## 2016-01-11 NOTE — Telephone Encounter (Signed)
Phone call Discussed with patient's daughter Bradly ChrisRene about blood count drifting down also possibility of Keppra affecting patient's fatigue level and blood count has appointment with neurology next week will review with neurologist Otherwise will stop aspirin Check CBC next office visit.

## 2016-01-15 ENCOUNTER — Encounter: Payer: Self-pay | Admitting: Neurology

## 2016-01-15 ENCOUNTER — Ambulatory Visit (INDEPENDENT_AMBULATORY_CARE_PROVIDER_SITE_OTHER): Payer: Medicare Other | Admitting: Neurology

## 2016-01-15 ENCOUNTER — Ambulatory Visit (INDEPENDENT_AMBULATORY_CARE_PROVIDER_SITE_OTHER): Payer: Medicare Other | Admitting: Family Medicine

## 2016-01-15 ENCOUNTER — Encounter: Payer: Self-pay | Admitting: Family Medicine

## 2016-01-15 VITALS — BP 116/68 | HR 62 | Temp 98.3°F

## 2016-01-15 VITALS — BP 128/76 | HR 70 | Temp 98.2°F

## 2016-01-15 DIAGNOSIS — R05 Cough: Secondary | ICD-10-CM | POA: Diagnosis not present

## 2016-01-15 DIAGNOSIS — R55 Syncope and collapse: Secondary | ICD-10-CM | POA: Diagnosis not present

## 2016-01-15 DIAGNOSIS — R569 Unspecified convulsions: Secondary | ICD-10-CM | POA: Diagnosis not present

## 2016-01-15 DIAGNOSIS — R059 Cough, unspecified: Secondary | ICD-10-CM

## 2016-01-15 MED ORDER — LEVETIRACETAM 500 MG PO TABS: ORAL_TABLET | ORAL | 3 refills | Status: DC

## 2016-01-15 MED ORDER — CEFDINIR 300 MG PO CAPS: 300.0000 mg | ORAL_CAPSULE | Freq: Two times a day (BID) | ORAL | 0 refills | Status: DC

## 2016-01-15 NOTE — Progress Notes (Signed)
   BP 128/76   Pulse 70   Temp 98.2 F (36.8 C)   SpO2 98%    Subjective:    Patient ID: [name] [name], male    DOB: 12/27/1921, 82 y.o.   MRN: [id number]  HPI: [name] Palmerlbert L Loudenslager is a 82 y.o. male  Chief Complaint  Patient presents with  . URI    started feeling worse over the weekend, dark colored mucus, coughing has increased again. Chest congestion, some runny nose.    Patient presents with worsening productive cough and chest congestion for about 5 days. States he never fully recovered from the long bough of bronchitis earlier this year, but was feeling some better until late last week. Has been regularly taking tessalon perles and mucinex daily. Using albuterol nebulizer prn. Denies SOB or wheezing, fevers, CP.   Relevant past medical, surgical, family and social history reviewed and updated as indicated. Interim medical history since our last visit reviewed. Allergies and medications reviewed and updated.  Review of Systems  Constitutional: Negative.   HENT: Positive for congestion.   Eyes: Negative.   Respiratory: Positive for cough.   Cardiovascular: Negative.   Gastrointestinal: Negative.   Genitourinary: Negative.   Musculoskeletal: Negative.   Neurological: Negative.   Psychiatric/Behavioral: Negative.     Per HPI unless specifically indicated above     Objective:    BP 128/76   Pulse 70   Temp 98.2 F (36.8 C)   SpO2 98%   Wt Readings from Last 3 Encounters:  01/02/16 215 lb (97.5 kg)  11/11/15 218 lb 9.6 oz (99.2 kg)  10/08/15 196 lb 5 oz (89 kg)    Physical Exam  Constitutional: He is oriented to person, place, and time. He appears well-developed and well-nourished. No distress.  HENT:  Head: Atraumatic.  Right Ear: External ear normal.  Left Ear: External ear normal.  Eyes: Conjunctivae are normal. Pupils are equal, round, and reactive to light. No scleral icterus.  Neck: Neck supple.  Cardiovascular: Normal rate.   Pulmonary/Chest: Effort normal.  No respiratory distress. He has no wheezes. He has no rales.  Decreased breath sounds throughout  Lymphadenopathy:    He has no cervical adenopathy.  Neurological: He is alert and oriented to person, place, and time.  Skin: Skin is warm and dry.  Psychiatric: He has a normal mood and affect. His behavior is normal.  Nursing note and vitals reviewed.     Assessment & Plan:   Problem List Items Addressed This Visit    None    Visit Diagnoses    Cough    -  Primary   Given worsening course and postural inability to clear mucus well, will treat with cefdinir for one week. Continue tessalon perles and mucinex.     Discussed antibiotic choices with daughter, she states he has done well with cefdinir in the past despite PCN allergy   Follow up plan: Return if symptoms worsen or fail to improve.

## 2016-01-15 NOTE — Progress Notes (Signed)
NEUROLOGY CONSULTATION NOTE  [name] [name] MRN: [id number] DOB: Oct 27, 1921  Referring provider: Dr. Vonita MossMark Crissman Primary care provider: Dr. Vonita MossMark Crissman  Reason for consult:  New onset seizure  Dear Dr Dossie Arbourrissman:  Thank you for your kind referral of [name] [name] for consultation of the above symptoms. Although his history is well known to you, please allow me to reiterate it for the purpose of our medical record. The patient was accompanied to the clinic by his daughter who also provides collateral information. Records and images were personally reviewed where available.  HISTORY OF PRESENT ILLNESS: This is a pleasant 82 year old right-handed man presenting after a hospital visit for seizure on 10/05/15. He has no recollection of the event, his daughter provides the details but was not present during the event. She reports that he had a bowel movement and as they were moving him back to the bed with hoyer lift, he had what appeared to be a seizure. Her sister reported that they sat him down, he started to lean over and backwards, then started having body shaking with eyes rolled back. He recovered fast after they lay him down, he was pale and "kind of out of it" briefly. No associated tongue bite or urinary incontinence. They report he has had episodes of his blood pressure dropping, and had been at the rehab recently when he passed out in the toilet having a bowel movement and diagnosed with vasovagal event.   [facility] was [facility] June for confusion and gazing up forward to the ceiling. His daughter states that his eyes would not track, as if he could not see or recognize things. He had an MRI brain with and without contrast, which did not show any acute changes. There was ventricular prominence without acute ventriculomegaly, patchy periventricular FLAIR abnormalities not significantly changed from 2012, regressed hemosiderin in the right lateral ventricle since the 2012 comparison,  intracranial MRA negative. He had a stroke workup with carotid dopplers showing less than 50% stenosis on the left, widely patent right ICA. Echo showed EF 45-50%, hypokinesis of the anterior myocardium, left atrium mildly dilated. He had an EEG which showed mild diffuse slowing from drowsiness, could not rule out general cerebral disturbance. During his stay, he had an episode of paroxysmal atrial fibrillation with RVR and started on amiodarone and Eliquis. He was noted to have visual deficits in the left eye in the temporal field and was advised to see ophtho. His daughter reports ophtho visit showed normal vision. She states that it took 2-3 days before he started seeing simple images such as the chair in front of him. He was discharged to rehab where he had the vasovagal event while having a BM, then 1-2 days later when he was back home with 24/7 care, the event noted above with convulsive activity occurred. Head CT did not show any acute changes. EEG was normal. He was discharged home on Keppra 500mg  BID. His daughter denies any further syncopal episodes since then. She has noticed he is drowsy on the Keppra.   He denies any headaches, dizziness, diplopia, dysarthria, dysphagia, neck/back pain, focal numbness/tingling/weakness, bowel/bladder dysfunction. No anosmia, tremors, no falls. When he is tired, they notice he seems to lean to his right side. He denies any olfactory/gustatory hallucinations, deja vu, rising epigastric sensation, focal numbness/tingling/weakness, myoclonic jerks. He denies any significant head injuries. No family history of dementia. He reports his memory is "not good." His daughter reports he is still "pretty  sharp," his short-term memory is not bad. She does report that sometimes he cannot recall what he had for supper th enight prior. He had previously been living with his wife but has had 24/7 care since Spring due to mobility issues. He stopped driving 5 years ago due to leg  weakness. His family took over bills, but denies any missed bill payments previously. His caregiver administers his medications. He had a normal birth and early development.  There is no history of febrile convulsions, CNS infections such as meningitis/encephalitis, significant traumatic brain injury, neurosurgical procedures, or family history of seizures.  PAST MEDICAL HISTORY: Past Medical History:  Diagnosis Date  . A-fib (HCC)   . Bradycardia    06/2010  . CAD (coronary artery disease)    nuclear 01/2008, no ischemia, EF 60%, distal anterior scar  . Carotid artery disease (HCC)    doppler 05/2009,  0-39% bilateral / doppler 06/2010 [place]  . Edema    Ankle edema August, 2012  . Ejection fraction    EF 45%, echo,05/2009,apical hypo,  ?? posterior and lateral hypo  . Elevated CPK    11/23/08.Marland Kitchen.Marland Kitchen.probably from debris from popliteal aneurysm  . Fall at home July 2015  . Hx of CABG    1998  . Hyperlipidemia   . Intracranial hemorrhage (HCC)    Two small subependymal bleeds 06/2010,,ASA held  . PAD (peripheral artery disease) (HCC)    Above the knee to below the knee popliteal bypass,, right  //    Doppler,  July, 2013, patent  . Popliteal aneurysm (HCC)    .repair..11/25/2008  Dr. Darrick PennaFields  . Prominent abdominal aortic pulsation    doppler normal  . Speech problem    Neurologic event with brief infusion and speech difficulty.... January, 2011.... being assessed by neurology  February, 2011  . Syncope    .remote..no etiology  /  syncope 11/2008.. from acute leg pain  . TIA (transient ischemic attack)    Brief episode of confusion and speech difficulty, January, 2012  /  recurrent confusion speech difficulty September, 2012  . Venous insufficiency    support hose    PAST SURGICAL HISTORY: Past Surgical History:  Procedure Laterality Date  . CORONARY ARTERY BYPASS GRAFT  1998  . popliteal bypass  with reversed greater saphenous vein from right leg.      MEDICATIONS: Current Outpatient  Prescriptions on File Prior to Visit  Medication Sig Dispense Refill  . acetaminophen (TYLENOL) 325 MG tablet Take 2 tablets (650 mg total) by mouth every 6 (six) hours as needed for mild pain (or Fever >/= 101).    Marland Kitchen. albuterol (PROVENTIL) (2.5 MG/3ML) 0.083% nebulizer solution Take 3 mLs (2.5 mg total) by nebulization every 6 (six) hours as needed for wheezing or shortness of breath. 150 mL 6  . amiodarone (PACERONE) 200 MG tablet TAKE 1 TABLET BY MOUTH ONCE A DAY (Patient taking differently: TAKE 1 TABLET BY MOUTH ONCE A DAY IN THE MORNING.) 30 tablet 0  . apixaban (ELIQUIS) 5 MG TABS tablet Take 1 tablet (5 mg total) by mouth 2 (two) times daily. 180 tablet 4  . aspirin EC 81 MG EC tablet Take 1 tablet (81 mg total) by mouth daily. (Patient taking differently: Take 81 mg by mouth every morning. ) 30 tablet 0  . benzonatate (TESSALON) 100 MG capsule TAKE ONE CAPSULE BY MOUTH EVERY 8 HOURS AS NEEDED COUGH 90 capsule 0  . calcium carbonate (TUMS - DOSED IN MG ELEMENTAL CALCIUM) 500 MG  chewable tablet Chew 2 tablets by mouth daily with supper.     . docusate sodium (COLACE) 100 MG capsule Take 100 mg by mouth 2 (two) times daily.     . fluticasone (FLONASE) 50 MCG/ACT nasal spray Place 1 spray into both nostrils daily.  2  . guaiFENesin (MUCINEX) 600 MG 12 hr tablet Take 1 tablet (600 mg total) by mouth 2 (two) times daily.    Marland Kitchen levETIRAcetam (KEPPRA) 500 MG tablet Take 1 tablet (500 mg total) by mouth 2 (two) times daily. 60 tablet 3  . Multiple Vitamin (MULTIVITAMIN WITH MINERALS) TABS tablet Take 1 tablet by mouth daily with breakfast.      No current facility-administered medications on file prior to visit.     ALLERGIES: Allergies  Allergen Reactions  . Penicillins Hives    Has patient had a PCN reaction causing immediate rash, facial/tongue/throat swelling, SOB or lightheadedness with hypotension: Yes Has patient had a PCN reaction causing severe rash involving mucus membranes or skin  necrosis: no Has patient had a PCN reaction that required hospitalization No Has patient had a PCN reaction occurring within the last 10 years: No If all of the above answers are "NO", then may proceed with Cephalosporin use.  Pt unsure of reaction,   . Latex Rash  . Sulfonamide Derivatives Rash    FAMILY HISTORY: Family History  Problem Relation Age of Onset  . Stroke Mother   . Diabetes Mother   . Diabetes Sister     SOCIAL HISTORY: Social History   Social History  . Marital status: Widowed    Spouse name: N/A  . Number of children: N/A  . Years of education: N/A   Occupational History  . Not on file.   Social History Main Topics  . Smoking status: Former Smoker    Years: 20.00    Types: Cigarettes    Quit date: 09/12/1946  . Smokeless tobacco: Never Used     Comment: quit appox 50 yrs ago  . Alcohol use No  . Drug use: No  . Sexual activity: Not on file   Other Topics Concern  . Not on file   Social History Narrative  . No narrative on file    REVIEW OF SYSTEMS: Constitutional: No fevers, chills, or sweats, no generalized fatigue, change in appetite Eyes: No visual changes, double vision, eye pain Ear, nose and throat: No hearing loss, ear pain, nasal congestion, sore throat Cardiovascular: No chest pain, palpitations Respiratory:  No shortness of breath at rest or with exertion, wheezes GastrointestinaI: No nausea, vomiting, diarrhea, abdominal pain, fecal incontinence Genitourinary:  No dysuria, urinary retention or frequency Musculoskeletal:  No neck pain, back pain Integumentary: No rash, pruritus, skin lesions Neurological: as above Psychiatric: No depression, insomnia, anxiety Endocrine: No palpitations, fatigue, diaphoresis, mood swings, change in appetite, change in weight, increased thirst Hematologic/Lymphatic:  No anemia, purpura, petechiae. Allergic/Immunologic: no itchy/runny eyes, nasal congestion, recent allergic reactions,  rashes  PHYSICAL EXAM: Vitals:   01/15/16 1323  BP: 116/68  Pulse: 62  Temp: 98.3 F (36.8 C)   General: No acute distress, sitting in a wheelchair Head:  Normocephalic/atraumatic Eyes: Fundoscopic exam shows bilateral sharp discs, no vessel changes, exudates, or hemorrhages Neck: supple, no paraspinal tenderness, full range of motion Back: No paraspinal tenderness Heart: regular rate and rhythm Lungs: Clear to auscultation bilaterally. Vascular: No carotid bruits. Skin/Extremities: No rash, no edema Neurological Exam: Mental status: alert and oriented to person, place, and month. Year is 2007.  No dysarthria or aphasia, Fund of knowledge is appropriate.  Remote memory intact.  Attention and concentration are normal.    Able to name objects and repeat phrases.  MMSE - Mini Mental State Exam 01/18/2016  Orientation to time 1  Orientation to Place 4  Registration 3  Attention/ Calculation 3  Recall 0  Language- name 2 objects 2  Language- repeat 1  Language- follow 3 step command 3  Language- read & follow direction 1  Write a sentence 0  Copy design 0  Total score 18   Cranial nerves: CN I: not tested CN II: pupils equal, round and reactive to light, visual fields intact, fundi unremarkable. CN III, IV, VI:  full range of motion, no nystagmus, no ptosis CN V: facial sensation intact CN VII: upper and lower face symmetric CN VIII: hearing intact to finger rub CN IX, X: gag intact, uvula midline CN XI: sternocleidomastoid and trapezius muscles intact CN XII: tongue midline Bulk & Tone: normal, no fasciculations. Motor: 5/5 throughout with no pronator drift. Sensation: intact to light touch, cold, pin, vibration and joint position sense.  No extinction to double simultaneous stimulation.   Deep Tendon Reflexes: +2 throughout, no ankle clonus Plantar responses: downgoing bilaterally Cerebellar: no incoordination on finger to nose, heel to shin. No dysdiadochokinesia Gait:  not tested, sitting on wheelchair, uses hoyer lift at home Tremor: none  IMPRESSION: This is a pleasant 82 year old right-handed man with a history of hyperlipidemia, CAD s/p CABG, recently diagnosed atrial fibrillation now on Eliquis, presenting after a convulsive episode last 10/05/15. This occurred after having a bowel movement. He had a syncopal episode at rehab while on the commode diagnosed as vasovagal with note of a drop in his BP. His head CT and EEG are unremarkable. He was started on Keppra for seizure prophylaxis, which is causing drowsiness. The episode described is suggestive of convulsive syncope rather than epileptic seizure. I discussed doing an ambulatory EEG, however it is difficult to transport the patient. Since he is drowsy on the Keppra, dose will be reduced to 250mg  BID and his daughter will call our office in a month to update on side effects. Would recommend repeating another EEG, and if unremarkable, we will taper off Keppra. He will follow-up in 6 months, we will plan for EEG at that time.   Thank you for allowing me to participate in the care of this patient. Please do not hesitate to call for any questions or concerns.   Patrcia Dolly, M.D.  CC: Dr. Dossie Arbour

## 2016-01-15 NOTE — Patient Instructions (Signed)
Follow up as needed

## 2016-01-15 NOTE — Patient Instructions (Signed)
1. Reduce Keppra 500mg : Take 1/2 tablet twice a day 2. Call our office in 1 month for an update 3. Follow-up in 6 months, call for any changes

## 2016-01-18 ENCOUNTER — Encounter: Payer: Self-pay | Admitting: Neurology

## 2016-03-19 ENCOUNTER — Ambulatory Visit (INDEPENDENT_AMBULATORY_CARE_PROVIDER_SITE_OTHER): Payer: Medicare Other | Admitting: Family Medicine

## 2016-03-19 ENCOUNTER — Encounter: Payer: Self-pay | Admitting: Family Medicine

## 2016-03-19 ENCOUNTER — Telehealth: Payer: Self-pay | Admitting: Family Medicine

## 2016-03-19 VITALS — BP 101/57 | HR 58 | Temp 98.6°F

## 2016-03-19 DIAGNOSIS — D649 Anemia, unspecified: Secondary | ICD-10-CM | POA: Diagnosis not present

## 2016-03-19 DIAGNOSIS — J019 Acute sinusitis, unspecified: Secondary | ICD-10-CM | POA: Diagnosis not present

## 2016-03-19 MED ORDER — CEFDINIR 300 MG PO CAPS: 300.0000 mg | ORAL_CAPSULE | Freq: Two times a day (BID) | ORAL | 0 refills | Status: DC

## 2016-03-19 NOTE — Telephone Encounter (Signed)
Spoke with daughter. They are on their way to the appointment.

## 2016-03-19 NOTE — Progress Notes (Signed)
   BP (!) 101/57   Pulse (!) 58   Temp 98.6 F (37 C) (Oral)   SpO2 95%    Subjective:    Patient ID: [name] [name], male    DOB: 06/20/1921, 83 y.o.   MRN: [id number]  HPI: [name] [name] is a 83 y.o. male  Chief Complaint  Patient presents with  . Cough  Patient accompanied by daughter who provides history patient with marked sinus congestion drainage cough productive using some Tessalon with minimal help has tried some other over-the-counter medications with Mucinex Tylenol sinus etc. with no real relief is been ongoing for several weeks and getting worse.  Relevant past medical, surgical, family and social history reviewed and updated as indicated. Interim medical history since our last visit reviewed. Allergies and medications reviewed and updated.  Review of Systems unable to get adequate review of systems from patient  Per HPI unless specifically indicated above     Objective:    BP (!) 101/57   Pulse (!) 58   Temp 98.6 F (37 C) (Oral)   SpO2 95%   Wt Readings from Last 3 Encounters:  01/02/16 215 lb (97.5 kg)  11/11/15 218 lb 9.6 oz (99.2 kg)  10/08/15 196 lb 5 oz (89 kg)    Physical Exam  Constitutional: He is oriented to person, place, and time. He appears well-developed and well-nourished. No distress.  HENT:  Head: Normocephalic and atraumatic.  Right Ear: Hearing and external ear normal.  Left Ear: Hearing and external ear normal.  Nose: Nose normal.  Mouth/Throat: Oropharyngeal exudate present.  Eyes: Conjunctivae and lids are normal. Right eye exhibits no discharge. Left eye exhibits no discharge. No scleral icterus.  Cardiovascular: Normal rate, regular rhythm and normal heart sounds.   Pulmonary/Chest: Effort normal. No respiratory distress.  Poor air movement but clear  Musculoskeletal: Normal range of motion.  Neurological: He is alert and oriented to person, place, and time.  Skin: Skin is intact. No rash noted.  Psychiatric: He has a normal  mood and affect. His speech is normal and behavior is normal. Judgment and thought content normal. Cognition and memory are normal.    Results for orders placed or performed in visit on 12/22/15  Urinalysis, Routine w reflex microscopic (not at [facility])  Result Value Ref Range   Specific Gravity, UA 1.020 1.005 - 1.030   pH, UA 6.0 5.0 - 7.5   Color, UA Yellow Yellow   Appearance Ur Clear Clear   Leukocytes, UA Negative Negative   Protein, UA Negative Negative/Trace   Glucose, UA Negative Negative   Ketones, UA Negative Negative   RBC, UA Negative Negative   Bilirubin, UA Negative Negative   Urobilinogen, Ur 1.0 0.2 - 1.0 mg/dL   Nitrite, UA Negative Negative      Assessment & Plan:   Problem List Items Addressed This Visit      Other   Anemia    Will check CBC today      Relevant Orders   CBC With Differential/Platelet    Other Visit Diagnoses    Acute sinusitis, recurrence not specified, unspecified location    -  Primary   Discussed sinusitis care and treatment patient unable to use cephalosporin without allergic reaction has had several times discuss other sinusitis care and trea   Relevant Medications   cefdinir (OMNICEF) 300 MG capsule       Follow up plan: Return for As scheduled.

## 2016-03-19 NOTE — Telephone Encounter (Signed)
Left message on machine for pt to return call to the office.  

## 2016-03-19 NOTE — Assessment & Plan Note (Signed)
Will check CBC today.  

## 2016-03-20 LAB — CBC WITH DIFFERENTIAL/PLATELET
HEMATOCRIT: 37 % — AB (ref 37.5–51.0)
HEMOGLOBIN: 12.1 g/dL — AB (ref 13.0–17.7)
LYMPHS: 13 %
Lymphocytes Absolute: 0.7 10*3/uL (ref 0.7–3.1)
MCH: 28.9 pg (ref 26.6–33.0)
MCHC: 32.7 g/dL (ref 31.5–35.7)
MCV: 88 fL (ref 79–97)
MID (ABSOLUTE): 0.5 10*3/uL (ref 0.1–1.6)
MID: 10 %
Neutrophils Absolute: 4.2 10*3/uL (ref 1.4–7.0)
Neutrophils: 77 %
Platelets: 199 10*3/uL (ref 150–379)
RBC: 4.19 x10E6/uL (ref 4.14–5.80)
RDW: 15.3 % (ref 12.3–15.4)
WBC: 5.4 10*3/uL (ref 3.4–10.8)

## 2016-04-01 ENCOUNTER — Telehealth: Payer: Self-pay | Admitting: Family Medicine

## 2016-04-01 MED ORDER — DOXYCYCLINE HYCLATE 100 MG PO TABS: 100.0000 mg | ORAL_TABLET | Freq: Two times a day (BID) | ORAL | 0 refills | Status: DC

## 2016-04-01 MED ORDER — GUAIFENESIN-CODEINE 100-10 MG/5ML PO SOLN: 5.0000 mL | Freq: Three times a day (TID) | ORAL | 0 refills | Status: DC | PRN

## 2016-04-01 NOTE — Telephone Encounter (Signed)
Patients daughter called to see if Dr Dossie Arbourrissman would call the patient in some medication as the patient was seen by Dr. Dossie Arbourrissman a few weeks ago.  It is very hard to transport the patient.  I also sent a message regarding patient's wife who was seen the same time for the same respiratory problem.  Thank Bonita QuinYou Clydie BraunKaren  [phone]

## 2016-04-01 NOTE — Telephone Encounter (Signed)
Discussion with daughter who is caregiver patient still having cough cold just not getting better having sometimes difficulty with breathing. Will give doxycycline and use low-dose cough syrup to help with patient being so uncomfortable. 

## 2016-04-01 NOTE — Telephone Encounter (Signed)
Please advise 

## 2016-04-09 ENCOUNTER — Encounter: Payer: Self-pay | Admitting: Family Medicine

## 2016-04-09 ENCOUNTER — Ambulatory Visit (INDEPENDENT_AMBULATORY_CARE_PROVIDER_SITE_OTHER): Payer: Medicare Other | Admitting: Family Medicine

## 2016-04-09 VITALS — BP 127/73 | HR 68 | Temp 97.9°F

## 2016-04-09 DIAGNOSIS — J069 Acute upper respiratory infection, unspecified: Secondary | ICD-10-CM

## 2016-04-09 NOTE — Progress Notes (Signed)
   BP 127/73   Pulse 68   Temp 97.9 F (36.6 C)   SpO2 100%    Subjective:    Patient ID: [name] [name], male    DOB: 05/29/1921, 83 y.o.   MRN: [id number]  HPI: [name] [name] is a 83 y.o. male  Chief Complaint  Patient presents with  . URI    head/chest congestion, weak, couldn't take deep breath. Had started improving.   Patient accompanied by daughter today. Patient presents for URI follow up. Was given cefdinir, then doxycycline when he wasn't improving. Has a few days left on the doxycycline. Started feeling better, but yesterday daughter states that he was very weak, couldn't get a good breath, and had a temperature of 99. Has been using the nebulizer every 6 hours which seems to help. Seems to be doing much better today.   Relevant past medical, surgical, family and social history reviewed and updated as indicated. Interim medical history since our last visit reviewed. Allergies and medications reviewed and updated.  Review of Systems  HENT: Negative.   Eyes: Negative.   Respiratory: Positive for cough, shortness of breath and wheezing.   Cardiovascular: Negative.   Gastrointestinal: Negative.   Genitourinary: Negative.   Musculoskeletal: Negative.   Neurological: Positive for weakness.  Psychiatric/Behavioral: Negative.     Per HPI unless specifically indicated above     Objective:    BP 127/73   Pulse 68   Temp 97.9 F (36.6 C)   SpO2 100%   Wt Readings from Last 3 Encounters:  01/02/16 215 lb (97.5 kg)  11/11/15 218 lb 9.6 oz (99.2 kg)  10/08/15 196 lb 5 oz (89 kg)    Physical Exam  Constitutional: He is oriented to person, place, and time. He appears well-developed and well-nourished.  HENT:  Head: Atraumatic.  Eyes: Conjunctivae are normal. Pupils are equal, round, and reactive to light. No scleral icterus.  Neck: Neck supple.  Cardiovascular: Normal rate and normal heart sounds.   Pulmonary/Chest: Effort normal. No respiratory distress. He has  wheezes (mild intermittent wheezes). He has no rales.  Musculoskeletal: Normal range of motion.  Neurological: He is alert and oriented to person, place, and time.  Skin: Skin is warm and dry.  Psychiatric: He has a normal mood and affect. His behavior is normal.  Nursing note and vitals reviewed.     Assessment & Plan:   Problem List Items Addressed This Visit    None    Visit Diagnoses    Upper respiratory tract infection, unspecified type    -  Primary   Improving with doxycycline and regular nebulizer treatments. Continue tessalon perles prn, has script at pharmacy for tussionex if needed       Follow up plan: Return if symptoms worsen or fail to improve.

## 2016-04-09 NOTE — Patient Instructions (Signed)
Follow up as needed

## 2016-04-11 ENCOUNTER — Other Ambulatory Visit: Payer: Self-pay | Admitting: Family Medicine

## 2016-04-11 NOTE — Telephone Encounter (Signed)
RX routed to provider

## 2016-04-11 NOTE — Telephone Encounter (Signed)
Routing to provider  

## 2016-04-14 ENCOUNTER — Other Ambulatory Visit: Payer: Self-pay | Admitting: Family Medicine

## 2016-04-16 ENCOUNTER — Telehealth: Payer: Self-pay | Admitting: Family Medicine

## 2016-04-16 MED ORDER — DOXYCYCLINE HYCLATE 100 MG PO TABS: 100.0000 mg | ORAL_TABLET | Freq: Two times a day (BID) | ORAL | 0 refills | Status: DC

## 2016-04-16 MED ORDER — AZITHROMYCIN 250 MG PO TABS: ORAL_TABLET | ORAL | 0 refills | Status: DC

## 2016-04-16 NOTE — Telephone Encounter (Signed)
Rx changed to doxycycline.

## 2016-04-16 NOTE — Telephone Encounter (Signed)
rx sent

## 2016-04-16 NOTE — Telephone Encounter (Signed)
Message relayed to patient. Verbalized understanding and denied questions.   

## 2016-04-16 NOTE — Telephone Encounter (Signed)
Please see prior message. No other medication has yet been sent in by provider, however, he did just finish seeing pt's in office.

## 2016-04-16 NOTE — Telephone Encounter (Signed)
Changed Rx

## 2016-04-16 NOTE — Telephone Encounter (Signed)
Per daughter pt cannot take azithromycin due to interaction with amiodarone. Please advise.

## 2016-04-16 NOTE — Telephone Encounter (Signed)
Pt seen 04/10/15 by Roosvelt Maserachel Lane, Per her A&P, " Visit Diagnoses    Upper respiratory tract infection, unspecified type    -  Primary   Improving with doxycycline and regular nebulizer treatments. Continue tessalon perles prn, has script at pharmacy for tussionex if needed   "  Please advise.

## 2016-04-16 NOTE — Addendum Note (Signed)
Addended byVonita Moss: Dayanis Bergquist on: 04/16/2016 04:39 PM   Modules accepted: Orders

## 2016-04-16 NOTE — Telephone Encounter (Signed)
[name] [name] called and stated that the pt isn't getting any better and would like to have something called in to [facility].

## 2016-05-03 ENCOUNTER — Encounter: Payer: Self-pay | Admitting: Vascular Surgery

## 2016-05-16 ENCOUNTER — Other Ambulatory Visit (HOSPITAL_COMMUNITY): Payer: Medicare Other

## 2016-05-16 ENCOUNTER — Ambulatory Visit: Payer: Medicare Other | Admitting: Vascular Surgery

## 2016-05-16 ENCOUNTER — Encounter (HOSPITAL_COMMUNITY): Payer: Medicare Other

## 2016-05-24 ENCOUNTER — Other Ambulatory Visit: Payer: Self-pay | Admitting: Family Medicine

## 2016-05-27 NOTE — Telephone Encounter (Signed)
Ms [name] LandsbergRenee called and stated that the pt was coughing green phlegm and would like to know if he could have something for a sinus infection sent to [facility].

## 2016-05-28 ENCOUNTER — Other Ambulatory Visit: Payer: Self-pay | Admitting: Family Medicine

## 2016-05-28 MED ORDER — DOXYCYCLINE HYCLATE 100 MG PO TABS: 100.0000 mg | ORAL_TABLET | Freq: Two times a day (BID) | ORAL | 0 refills | Status: DC

## 2016-05-30 ENCOUNTER — Emergency Department: Payer: Medicare Other

## 2016-05-30 ENCOUNTER — Encounter: Payer: Self-pay | Admitting: *Deleted

## 2016-05-30 ENCOUNTER — Emergency Department
Admission: EM | Admit: 2016-05-30 | Discharge: 2016-05-30 | Disposition: A | Discharge status: Home / Self Care | Payer: Medicare Other | Attending: Emergency Medicine | Admitting: Emergency Medicine

## 2016-05-30 DIAGNOSIS — Z87891 Personal history of nicotine dependence: Secondary | ICD-10-CM | POA: Insufficient documentation

## 2016-05-30 DIAGNOSIS — R05 Cough: Secondary | ICD-10-CM | POA: Insufficient documentation

## 2016-05-30 DIAGNOSIS — R609 Edema, unspecified: Secondary | ICD-10-CM | POA: Insufficient documentation

## 2016-05-30 DIAGNOSIS — I2510 Atherosclerotic heart disease of native coronary artery without angina pectoris: Secondary | ICD-10-CM | POA: Insufficient documentation

## 2016-05-30 DIAGNOSIS — R55 Syncope and collapse: Secondary | ICD-10-CM | POA: Insufficient documentation

## 2016-05-30 DIAGNOSIS — I5022 Chronic systolic (congestive) heart failure: Secondary | ICD-10-CM | POA: Diagnosis not present

## 2016-05-30 LAB — COMPREHENSIVE METABOLIC PANEL
ALK PHOS: 54 U/L (ref 38–126)
ALT: 17 U/L (ref 17–63)
AST: 26 U/L (ref 15–41)
Albumin: 3 g/dL — ABNORMAL LOW (ref 3.5–5.0)
Anion gap: 7 (ref 5–15)
BILIRUBIN TOTAL: 0.7 mg/dL (ref 0.3–1.2)
BUN: 13 mg/dL (ref 6–20)
CALCIUM: 8.8 mg/dL — AB (ref 8.9–10.3)
CO2: 29 mmol/L (ref 22–32)
Chloride: 97 mmol/L — ABNORMAL LOW (ref 101–111)
Creatinine, Ser: 0.53 mg/dL — ABNORMAL LOW (ref 0.61–1.24)
GFR calc Af Amer: >60 mL/min (ref >60)
Glucose, Bld: 122 mg/dL — ABNORMAL HIGH (ref 65–99)
Potassium: 4.2 mmol/L (ref 3.5–5.1)
Sodium: 133 mmol/L — ABNORMAL LOW (ref 135–145)
Total Protein: 6.6 g/dL (ref 6.5–8.1)

## 2016-05-30 LAB — URINALYSIS, COMPLETE (UACMP) WITH MICROSCOPIC
BACTERIA UA: NONE SEEN
Bilirubin Urine: NEGATIVE
Glucose, UA: NEGATIVE mg/dL
Ketones, ur: NEGATIVE mg/dL
Leukocytes, UA: NEGATIVE
Nitrite: NEGATIVE
PROTEIN: NEGATIVE mg/dL
SQUAMOUS EPITHELIAL / LPF: NONE SEEN
Specific Gravity, Urine: 1.013 (ref 1.005–1.030)
pH: 7 (ref 5.0–8.0)

## 2016-05-30 LAB — CBC WITH DIFFERENTIAL/PLATELET
BASOS ABS: 0 10*3/uL (ref 0–0.1)
BASOS PCT: 1 %
Eosinophils Absolute: 0.1 10*3/uL (ref 0–0.7)
Eosinophils Relative: 2 %
HEMATOCRIT: 37.1 % — AB (ref 40.0–52.0)
HEMOGLOBIN: 12.7 g/dL — AB (ref 13.0–18.0)
Lymphocytes Relative: 10 %
Lymphs Abs: 0.7 10*3/uL — ABNORMAL LOW (ref 1.0–3.6)
MCH: 29.8 pg (ref 26.0–34.0)
MCHC: 34.1 g/dL (ref 32.0–36.0)
MCV: 87.2 fL (ref 80.0–100.0)
MONOS PCT: 4 %
Monocytes Absolute: 0.3 10*3/uL (ref 0.2–1.0)
NEUTROS ABS: 5.8 10*3/uL (ref 1.4–6.5)
NEUTROS PCT: 83 %
Platelets: 194 10*3/uL (ref 150–440)
RBC: 4.26 MIL/uL — AB (ref 4.40–5.90)
RDW: 18.9 % — ABNORMAL HIGH (ref 11.5–14.5)
WBC: 6.9 10*3/uL (ref 3.8–10.6)

## 2016-05-30 LAB — BRAIN NATRIURETIC PEPTIDE: B NATRIURETIC PEPTIDE 5: 221 pg/mL — AB (ref 0.0–100.0)

## 2016-05-30 LAB — TROPONIN I

## 2016-05-30 NOTE — ED Provider Notes (Signed)
Time Seen: Approximately 1305  I have reviewed the triage notes  Chief Complaint: Near Syncope   History of Present Illness: [name] [name] is a 83 y.o. male  who has a history of numerous chronic medical problems. Patient was being treated with his caretaker at home when he had an episode of what sounds like near syncope versus eyes rolled up toward the top of his head and his left arm seemed to go limp. He did not lose consciousness but they describe him as being unresponsive for a short period of time. There was no witnessed seizure activity. He is currently under treatment with doxycycline for upper respiratory symptoms and is on chronic amiodarone. History of coronary artery disease, chronic atrial fibrillation/bradycardia IV sick sinus syndrome etc. Patient did not bite his tongue, urinate or defecate. He has had a very wet sounding cough and it was a gradualthat the caretaker states that she heard at the time. Oxygen levels seem to be stable and he was hypertensive though questionable numbers at the scene of a diastolic pressure of 187? Patient seems to be back at his baseline at this point denies any pain in the chest and abdomen, head, etc. He states that he is not aware of the episode and denies actually passing out.   Past Medical History:  Diagnosis Date  . A-fib (HCC)   . Bradycardia    06/2010  . CAD (coronary artery disease)    nuclear 01/2008, no ischemia, EF 60%, distal anterior scar  . Carotid artery disease (HCC)    doppler 05/2009,  0-39% bilateral / doppler 06/2010 [place]  . Edema    Ankle edema August, 2012  . Ejection fraction    EF 45%, echo,05/2009,apical hypo,  ?? posterior and lateral hypo  . Elevated CPK    11/23/08.Marland Kitchen.Marland Kitchen.probably from debris from popliteal aneurysm  . Fall at home July 2015  . Hx of CABG    1998  . Hyperlipidemia   . Intracranial hemorrhage (HCC)    Two small subependymal bleeds 06/2010,,ASA held  . PAD (peripheral artery disease) (HCC)    Above the  knee to below the knee popliteal bypass,, right  //    Doppler,  July, 2013, patent  . Popliteal aneurysm (HCC)    .repair..11/25/2008  Dr. Darrick PennaFields  . Prominent abdominal aortic pulsation    doppler normal  . Speech problem    Neurologic event with brief infusion and speech difficulty.... January, 2011.... being assessed by neurology  February, 2011  . Syncope    .remote..no etiology  /  syncope 11/2008.. from acute leg pain  . TIA (transient ischemic attack)    Brief episode of confusion and speech difficulty, January, 2012  /  recurrent confusion speech difficulty September, 2012  . Venous insufficiency    support hose    Patient Active Problem List   Diagnosis Date Noted  . Anemia 03/19/2016  . Seizure (HCC) 10/05/2015  . A-fib (HCC) 10/05/2015  . Chronic systolic (congestive) heart failure 09/07/2015  . Altered mental status 08/18/2015  . CAD in native artery 08/07/2015  . Arterial disease (HCC) 08/07/2015  . HLD (hyperlipidemia) 08/07/2015  . Temporary cerebral vascular dysfunction 08/07/2015  . Chronic venous insufficiency 08/07/2015  . H/O neoplasm 08/22/2014  . H/O malignant neoplasm of skin 08/22/2014  . 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 Peripheral vascular disease (HCC) 02/28/2014  . Postsurgical aortocoronary bypass status 02/28/2014  . Syncope and collapse 02/28/2014  . 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 PAD (peripheral artery disease) (HCC)   . TIA (transient ischemic attack)   . Edema   . Hyperlipidemia   . Syncope   . CAD (coronary artery disease)   . Venous insufficiency   . Carotid artery disease (HCC)   . Elevated CPK   . Popliteal aneurysm (HCC)   . Hx of CABG   . Intracranial hemorrhage [facility])     Past Surgical History:  Procedure Laterality Date  . CORONARY ARTERY BYPASS GRAFT  1998  . popliteal bypass  with reversed greater saphenous vein from right  leg.      Past Surgical History:  Procedure Laterality Date  . CORONARY ARTERY BYPASS GRAFT  1998  . popliteal bypass  with reversed greater saphenous vein from right leg.      Current Outpatient Rx  . Order #: [id number] Class: OTC  . Order #: [id number] Class: Normal  . Order #: [id number] Class: Normal  . Order #: [id number] Class: Print  . Order #: [id number] Class: Normal  . Order #: [id number] Class: Historical Med  . Order #: [id number] Class: Historical Med  . Order #: [id number] Class: Normal  . Order #: [id number] Class: No Print  . Order #: [id number] Class: No Print  . Order #: [id number] Class: Print  . Order #: [id number] Class: Normal  . Order #: [id number] Class: Historical Med    Allergies:  Penicillins; Latex; and Sulfonamide derivatives  Family History: Family History  Problem Relation Age of Onset  . Stroke Mother   . Diabetes Mother   . Diabetes Sister     Social History: Social History  Substance Use Topics  . Smoking status: Former Smoker    Years: 20.00    Types: Cigarettes    Quit date: 09/12/1946  . Smokeless tobacco: Never Used     Comment: quit appox 50 yrs ago  . Alcohol use No     Review of Systems:   10 point review of systems was performed and was otherwise negative:  Constitutional: No feverAt home Eyes: No visual disturbances ENT: No sore throat, ear pain Cardiac: No chest pain Respiratory: Recent upper respiratory symptoms with a wet cough Abdomen: No abdominal pain, no vomiting, No diarrhea Endocrine: No weight loss, No night sweats Extremities: Chronic peripheral edema. No cyanosis Skin: No rashes, easy bruising Neurologic: No focal weakness, trouble with speech or swollowing Urologic: No dysuria, Hematuria, or urinary frequency   Physical Exam:  ED Triage Vitals  Enc Vitals Group     BP 05/30/16 1231 140/80     Pulse Rate 05/30/16 1231 (!) 59     Resp 05/30/16 1231 18     Temp 05/30/16 1231 97.6 F (36.4 C)     Temp Source 05/30/16  1231 Oral     SpO2 05/30/16 1231 91 %     Weight 05/30/16 1234 198 lb 14.4 oz (90.2 kg)     Height --      Head Circumference --      Peak Flow --      Pain Score --      Pain Loc --      Pain Edu? --      Excl. in GC? --     General: Awake , Alert , and Oriented times 3; GCS 15 Somewhat listless but is able to be aroused and answers questions appropriately Head: Normal cephalic , atraumatic Eyes: Pupils equal , round, reactive to light Nose/Throat: No nasal drainage, patent upper airway without erythema or exudate.  Neck: Supple, Full range of motion, No anterior adenopathy or palpable thyroid masses Lungs: Clear to ascultation without wheezes , rolling rhonchi at the bases  Heart: Regular rate, regular rhythm without murmurs , gallops , or rubs Abdomen: Soft, non tender without rebound, guarding , or rigidity; bowel sounds positive and symmetric in all 4 quadrants. No organomegaly .        Extremities: Large chronic peripheral edema symmetric with circumferential swelling Neurologic: normal ambulation, Motor symmetric without deficits, sensory intact Skin: warm, dry, no rashes   Labs:   All laboratory work was reviewed including any pertinent negatives or positives listed below:  Labs Reviewed  COMPREHENSIVE METABOLIC PANEL  CBC WITH DIFFERENTIAL/PLATELET  TROPONIN I  URINALYSIS, COMPLETE (UACMP) WITH MICROSCOPIC  BRAIN NATRIURETIC PEPTIDE    EKG: * ED ECG REPORT I, Jennye Moccasin, the attending physician, personally viewed and interpreted this ECG.  Date: 05/30/2016 EKG Time:1254 Rate: *60 Rhythm: normal sinus rhythm QRS Axis: Right axis deviation Intervals: normal ST/T Wave abnormalities: normal Conduction Disturbances: none Narrative Interpretation: unremarkable No acute ischemic changes  Radiology: "Dg Chest 2 View  Result Date: 05/30/2016 CLINICAL DATA:  Lethargy, cough. EXAM: CHEST  2 VIEW COMPARISON:  Chest radiograph October 05, 2015 FINDINGS: Low  inspiratory examination with crowded vascular markings and bibasilar strandy densities similar to prior examination. No pleural effusion or focal consolidation. Cardiac silhouette is mildly enlarged, status post median sternotomy for CABG. Calcified aortic knob. Apical pleural thickening. No pneumothorax. Osteopenia. Soft tissue planes are nonsuspicious. Colonic intra positioning. IMPRESSION: Similar low inspiratory examination with bibasilar atelectasis. Electronically Signed   By: Awilda Metro M.D.   On: 05/30/2016 13:40   Ct Head Wo Contrast  Result Date: 05/30/2016 CLINICAL DATA:  Lethargy today. EXAM: CT HEAD WITHOUT CONTRAST TECHNIQUE: Contiguous axial images were obtained from the base of the skull through the vertex without intravenous contrast. COMPARISON:  Head CT scan 11/11/2015.  Brain MRI 08/17/2015. FINDINGS: Brain: Atrophy and extensive chronic microvascular ischemic change are identified. No evidence of acute intracranial abnormality including hemorrhage, infarct, mass lesion, mass effect, midline shift or abnormal extra-axial fluid collection is seen. No hydrocephalus or pneumocephalus. Vascular: Extensive atherosclerosis is noted. Skull: Intact. Sinuses/Orbits: Negative. Other: None. IMPRESSION: No acute abnormality. Atrophy and extensive chronic microvascular ischemic change. Atherosclerosis. Electronically Signed   By: Drusilla Kanner M.D.   On: 05/30/2016 12:53  "    I personally reviewed the radiologic studies     ED Course:  The patient was placed on a continuous cardiac monitor remained in normal sinus rhythm and all vital signs remained stable. His mental status remained stable and his laboratory work does not show any significant abnormalities. I felt the patient could be discharged home and found no evidence of a cardiovascular or cerebrovascular event to cause his symptoms at this time. Case was explained to the family and agreed with discharge home and close  follow-up with his outpatient physicians. Continue all current medications.     Assessment: * Near syncope    Plan:  Outpatient Patient was advised to return immediately if condition worsens. Patient was advised to follow up with their primary care physician or other specialized physicians involved in their outpatient care. The patient and/or family member/power of attorney had laboratory results reviewed at the bedside. All questions and concerns were addressed and appropriate discharge instructions were distributed by the nursing staff.            Jennye Moccasin, MD 05/30/16 [phone]

## 2016-05-30 NOTE — Discharge Instructions (Signed)
Please return immediately if condition worsens. Please contact her primary physician or the physician you were given for referral. If you have any specialist physicians involved in her treatment and plan please also contact them. Thank you for using [facility]. ° °

## 2016-05-30 NOTE — ED Triage Notes (Signed)
Per EMS report, patient had episode of staring, gazing per family report. No seizure activity noted. Family states patient is lethargic from baseline. Patient is alert and oriented to place and self at this time.

## 2016-05-30 NOTE — ED Notes (Signed)
Patient transported to imaging.

## 2016-06-03 ENCOUNTER — Telehealth: Payer: Self-pay | Admitting: Family Medicine

## 2016-06-03 NOTE — Telephone Encounter (Signed)
Routing to provider.  Please enter in referral if necessary

## 2016-06-03 NOTE — Telephone Encounter (Signed)
Please do

## 2016-06-03 NOTE — Telephone Encounter (Signed)
Message sent to [facility] from [place].  Will wait response from her.

## 2016-06-03 NOTE — Telephone Encounter (Signed)
Patients daughter called to see if Dr Dossie Arbour would put in an order for [facility] to come out to do a wound care check on this patient.  Frances Furbish comes out for the patients wife.  The patient has a wound at the top of his tailbone that is starting to get more irritated on the top layer...  Thanks  [name] [phone]

## 2016-06-04 NOTE — Telephone Encounter (Signed)
Received response from [place] from [place]. She is calling patient to set up day and time.

## 2016-06-06 ENCOUNTER — Emergency Department: Payer: Medicare Other

## 2016-06-06 ENCOUNTER — Encounter: Payer: Self-pay | Admitting: Emergency Medicine

## 2016-06-06 ENCOUNTER — Emergency Department
Admission: EM | Admit: 2016-06-06 | Discharge: 2016-06-06 | Disposition: A | Discharge status: Home / Self Care | Payer: Medicare Other | Attending: Emergency Medicine | Admitting: Emergency Medicine

## 2016-06-06 ENCOUNTER — Telehealth: Payer: Self-pay | Admitting: Family Medicine

## 2016-06-06 DIAGNOSIS — Z87891 Personal history of nicotine dependence: Secondary | ICD-10-CM | POA: Diagnosis not present

## 2016-06-06 DIAGNOSIS — Z79899 Other long term (current) drug therapy: Secondary | ICD-10-CM | POA: Diagnosis not present

## 2016-06-06 DIAGNOSIS — I2510 Atherosclerotic heart disease of native coronary artery without angina pectoris: Secondary | ICD-10-CM | POA: Insufficient documentation

## 2016-06-06 DIAGNOSIS — L03116 Cellulitis of left lower limb: Secondary | ICD-10-CM | POA: Insufficient documentation

## 2016-06-06 DIAGNOSIS — I5022 Chronic systolic (congestive) heart failure: Secondary | ICD-10-CM | POA: Insufficient documentation

## 2016-06-06 DIAGNOSIS — L03818 Cellulitis of other sites: Secondary | ICD-10-CM

## 2016-06-06 DIAGNOSIS — R21 Rash and other nonspecific skin eruption: Secondary | ICD-10-CM | POA: Diagnosis present

## 2016-06-06 MED ORDER — CLINDAMYCIN HCL 150 MG PO CAPS
300.0000 mg | ORAL_CAPSULE | Freq: Once | ORAL | Status: AC
  Administered 2016-06-06: 300 mg via ORAL
  Filled 2016-06-06: qty 2

## 2016-06-06 MED ORDER — CLINDAMYCIN HCL 150 MG PO CAPS: 300.0000 mg | ORAL_CAPSULE | Freq: Three times a day (TID) | ORAL | 0 refills | Status: AC

## 2016-06-06 MED ORDER — DOXYCYCLINE HYCLATE 100 MG PO TABS: 100.0000 mg | ORAL_TABLET | Freq: Two times a day (BID) | ORAL | 0 refills | Status: DC

## 2016-06-06 NOTE — Telephone Encounter (Signed)
rx sent

## 2016-06-06 NOTE — Discharge Instructions (Signed)
Please have [name] [name] take all of his antibiotics as prescribed and make sure that someone takes a look at his infection in 2 days for recheck. Return to the emergency department sooner for any new or worsening symptoms such as fevers, chills, worsening swelling, or for any other concerns.  It was a pleasure to take care of you today, and thank you for coming to our emergency department.  If you have any questions or concerns before leaving please ask the nurse to grab me and I'm more than happy to go through your aftercare instructions again.  If you were prescribed any opioid pain medication today such as Norco, Vicodin, Percocet, morphine, hydrocodone, or oxycodone please make sure you do not drive when you are taking this medication as it can alter your ability to drive safely.  If you have any concerns once you are home that you are not improving or are in fact getting worse before you can make it to your follow-up appointment, please do not hesitate to call [phone] and come back for further evaluation.  Merrily Brittle MD  Results for orders placed or performed during the hospital encounter of 05/30/16  Comprehensive metabolic panel  Result Value Ref Range   Sodium 133 (L) 135 - 145 mmol/L   Potassium 4.2 3.5 - 5.1 mmol/L   Chloride 97 (L) 101 - 111 mmol/L   CO2 29 22 - 32 mmol/L   Glucose, Bld 122 (H) 65 - 99 mg/dL   BUN 13 6 - 20 mg/dL   Creatinine, Ser 1.61 (L) 0.61 - 1.24 mg/dL   Calcium 8.8 (L) 8.9 - 10.3 mg/dL   Total Protein 6.6 6.5 - 8.1 g/dL   Albumin 3.0 (L) 3.5 - 5.0 g/dL   AST 26 15 - 41 U/L   ALT 17 17 - 63 U/L   Alkaline Phosphatase 54 38 - 126 U/L   Total Bilirubin 0.7 0.3 - 1.2 mg/dL   GFR calc non Af Amer >60 >60 mL/min   GFR calc Af Amer >60 >60 mL/min   Anion gap 7 5 - 15  CBC with Differential/Platelet  Result Value Ref Range   WBC 6.9 3.8 - 10.6 K/uL   RBC 4.26 (L) 4.40 - 5.90 MIL/uL   Hemoglobin 12.7 (L) 13.0 - 18.0 g/dL   HCT 09.6 (L) 04.5 - 40.9 %   MCV 87.2  80.0 - 100.0 fL   MCH 29.8 26.0 - 34.0 pg   MCHC 34.1 32.0 - 36.0 g/dL   RDW 81.1 (H) 91.4 - 78.2 %   Platelets 194 150 - 440 K/uL   Neutrophils Relative % 83 %   Neutro Abs 5.8 1.4 - 6.5 K/uL   Lymphocytes Relative 10 %   Lymphs Abs 0.7 (L) 1.0 - 3.6 K/uL   Monocytes Relative 4 %   Monocytes Absolute 0.3 0.2 - 1.0 K/uL   Eosinophils Relative 2 %   Eosinophils Absolute 0.1 0 - 0.7 K/uL   Basophils Relative 1 %   Basophils Absolute 0.0 0 - 0.1 K/uL  Troponin I  Result Value Ref Range   Troponin I <0.03 <0.03 ng/mL  Urinalysis, Complete w Microscopic  Result Value Ref Range   Color, Urine YELLOW (A) YELLOW   APPearance CLOUDY (A) CLEAR   Specific Gravity, Urine 1.013 1.005 - 1.030   pH 7.0 5.0 - 8.0   Glucose, UA NEGATIVE NEGATIVE mg/dL   Hgb urine dipstick SMALL (A) NEGATIVE   Bilirubin Urine NEGATIVE NEGATIVE   Ketones, ur  NEGATIVE NEGATIVE mg/dL   Protein, ur NEGATIVE NEGATIVE mg/dL   Nitrite NEGATIVE NEGATIVE   Leukocytes, UA NEGATIVE NEGATIVE   RBC / HPF 6-30 0 - 5 RBC/hpf   WBC, UA 0-5 0 - 5 WBC/hpf   Bacteria, UA NONE SEEN NONE SEEN   Squamous Epithelial / LPF NONE SEEN NONE SEEN   Mucous PRESENT   Brain natriuretic peptide  Result Value Ref Range   B Natriuretic Peptide 221.0 (H) 0.0 - 100.0 pg/mL   Dg Chest 2 View  Result Date: 05/30/2016 CLINICAL DATA:  Lethargy, cough. EXAM: CHEST  2 VIEW COMPARISON:  Chest radiograph October 05, 2015 FINDINGS: Low inspiratory examination with crowded vascular markings and bibasilar strandy densities similar to prior examination. No pleural effusion or focal consolidation. Cardiac silhouette is mildly enlarged, status post median sternotomy for CABG. Calcified aortic knob. Apical pleural thickening. No pneumothorax. Osteopenia. Soft tissue planes are nonsuspicious. Colonic intra positioning. IMPRESSION: Similar low inspiratory examination with bibasilar atelectasis. Electronically Signed   By: Awilda Metro M.D.   On: 05/30/2016  13:40   Ct Head Wo Contrast  Result Date: 05/30/2016 CLINICAL DATA:  Lethargy today. EXAM: CT HEAD WITHOUT CONTRAST TECHNIQUE: Contiguous axial images were obtained from the base of the skull through the vertex without intravenous contrast. COMPARISON:  Head CT scan 11/11/2015.  Brain MRI 08/17/2015. FINDINGS: Brain: Atrophy and extensive chronic microvascular ischemic change are identified. No evidence of acute intracranial abnormality including hemorrhage, infarct, mass lesion, mass effect, midline shift or abnormal extra-axial fluid collection is seen. No hydrocephalus or pneumocephalus. Vascular: Extensive atherosclerosis is noted. Skull: Intact. Sinuses/Orbits: Negative. Other: None. IMPRESSION: No acute abnormality. Atrophy and extensive chronic microvascular ischemic change. Atherosclerosis. Electronically Signed   By: Drusilla Kanner M.D.   On: 05/30/2016 12:53   US Venous Img Lower Unilateral Left  Result Date: 06/06/2016 CLINICAL DATA:  Lower extremity erythema, pain, and edema. The patient takes Eliquis. EXAM: Left LOWER EXTREMITY VENOUS DOPPLER ULTRASOUND TECHNIQUE: Gray-scale sonography with graded compression, as well as color Doppler and duplex ultrasound were performed to evaluate the lower extremity deep venous systems from the level of the common femoral vein and including the common femoral, femoral, profunda femoral, popliteal and calf veins including the posterior tibial, peroneal and gastrocnemius veins when visible. The superficial great saphenous vein was also interrogated. Spectral Doppler was utilized to evaluate flow at rest and with distal augmentation maneuvers in the common femoral, femoral and popliteal veins. COMPARISON:  03/28/2015 FINDINGS: Contralateral Common Femoral Vein: Respiratory phasicity is normal and symmetric with the symptomatic side. No evidence of thrombus. Normal compressibility. Common Femoral Vein: No evidence of thrombus. Normal compressibility, respiratory  phasicity and response to augmentation. Saphenofemoral Junction: No evidence of thrombus. Normal compressibility and flow on color Doppler imaging. Profunda Femoral Vein: No evidence of thrombus. Normal compressibility and flow on color Doppler imaging. Femoral Vein: No evidence of thrombus. Normal compressibility, respiratory phasicity and response to augmentation. Popliteal Vein: No evidence of thrombus. Normal compressibility, respiratory phasicity and response to augmentation. Calf Veins: No evidence of thrombus. Normal compressibility and flow on color Doppler imaging. Superficial Great Saphenous Vein: No evidence of thrombus. Normal compressibility and flow on color Doppler imaging. Venous Reflux:  None. Other Findings:  None. IMPRESSION: No evidence of left lower extremity deep venous thrombosis. Electronically Signed   By: Gaylyn Rong M.D.   On: 06/06/2016 14:25   Dg Hip Unilat W Or Wo Pelvis 2-3 Views Left  Result Date: 06/06/2016 CLINICAL DATA:  Hip  pain with movement Pt being seen at [facility] for left hip pain with movement, sent here for red, hardened warm area on his left ankle area for r/o DVT EXAM: DG HIP (WITH OR WITHOUT PELVIS) 2-3V LEFT COMPARISON:  06/11/2012 FINDINGS: There is no acute fracture or dislocation. Bones appear radiolucent. The femoral heads appear sclerotic bilaterally, raising the question of avascular necrosis. There are degenerative changes of the lower lumbar spine. There is atherosclerosis of the abdominal aorta. Visualized bowel gas pattern is nonobstructed. IMPRESSION: 1. No acute fracture or subluxation. 2. Sclerotic femoral heads bilaterally. Consider MRI for further evaluation to exclude avascular necrosis. Electronically Signed   By: Norva Pavlov M.D.   On: 06/06/2016 14:58

## 2016-06-06 NOTE — ED Notes (Signed)
Pt presents from [facility] with red, warm area to left ankle. Pt has pitting edema to both legs and feet, and the area is to the proximal side. Pt does not walk, cannot bear weight on his legs. Pt alert & able to answer questions at baseline.

## 2016-06-06 NOTE — ED Provider Notes (Signed)
[facility] Emergency Department Provider Note  ____________________________________________   First MD Initiated Contact with Patient 06/06/16 1502     (approximate)  I have reviewed the triage vital signs and the nursing notes.   HISTORY  Chief Complaint Claudication  History provided from the patient's daughter   HPI [name] [name] is a 83 y.o. male who comes to the emergency department with 2 days of redness and swelling to the lateral aspect of his left ankle. He was sent from [facility] clinic for rule out of deep vein thrombus.It is no history of DVT or pulmonary embolism. He is wheelchair-bound and normally wears guards around his ankles but has been rubbing his ankles against the side of the wheelchair recently. He's had no fevers or chills. He is currently completing a course of doxycycline for bronchitis.   Past Medical History:  Diagnosis Date  . A-fib (HCC)   . Bradycardia    06/2010  . CAD (coronary artery disease)    nuclear 01/2008, no ischemia, EF 60%, distal anterior scar  . Carotid artery disease (HCC)    doppler 05/2009,  0-39% bilateral / doppler 06/2010 OK  . Edema    Ankle edema August, 2012  . Ejection fraction    EF 45%, echo,05/2009,apical hypo,  ?? posterior and lateral hypo  . Elevated CPK    11/23/08.Marland KitchenMarland Kitchenprobably from debris from popliteal aneurysm  . Fall at home July 2015  . Hx of CABG    1998  . Hyperlipidemia   . Intracranial hemorrhage (HCC)    Two small subependymal bleeds 06/2010,,ASA held  . PAD (peripheral artery disease) (HCC)    Above the knee to below the knee popliteal bypass,, right  //    Doppler,  July, 2013, patent  . Popliteal aneurysm (HCC)    .repair..11/25/2008  Dr. Darrick Penna  . Prominent abdominal aortic pulsation    doppler normal  . Speech problem    Neurologic event with brief infusion and speech difficulty.... January, 2011.... being assessed by neurology  February, 2011  . Syncope    .remote..no  etiology  /  syncope 11/2008.. from acute leg pain  . TIA (transient ischemic attack)    Brief episode of confusion and speech difficulty, January, 2012  /  recurrent confusion speech difficulty September, 2012  . Venous insufficiency    support hose    Patient Active Problem List   Diagnosis Date Noted  . Anemia 03/19/2016  . Seizure (HCC) 10/05/2015  . A-fib (HCC) 10/05/2015  . Chronic systolic (congestive) heart failure 09/07/2015  . Altered mental status 08/18/2015  . CAD in native artery 08/07/2015  . Arterial disease (HCC) 08/07/2015  . HLD (hyperlipidemia) 08/07/2015  . Temporary cerebral vascular dysfunction 08/07/2015  . Chronic venous insufficiency 08/07/2015  . H/O neoplasm 08/22/2014  . H/O malignant neoplasm of skin 08/22/2014  . 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 Peripheral vascular disease (HCC) 02/28/2014  . Postsurgical aortocoronary bypass status 02/28/2014  . Syncope and collapse 02/28/2014  . 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 PAD (peripheral artery disease) (HCC)   . TIA (transient ischemic attack)   . Edema   . Hyperlipidemia   . Syncope   . CAD (coronary artery disease)   . Venous insufficiency   . Carotid artery disease (HCC)   . Elevated CPK   . Popliteal aneurysm (HCC)   . Hx of CABG   . Intracranial hemorrhage (  [facility])     Past Surgical History:  Procedure Laterality Date  . CORONARY ARTERY BYPASS GRAFT  1998  . popliteal bypass  with reversed greater saphenous vein from right leg.      Prior to Admission medications   Medication Sig Start Date End Date Taking? Authorizing Provider  acetaminophen (TYLENOL) 325 MG tablet Take 2 tablets (650 mg total) by mouth every 6 (six) hours as needed for mild pain (or Fever >/= 101). 10/08/15   Ramonita Lab, MD  albuterol (PROVENTIL) (2.5 MG/3ML) 0.083% nebulizer solution Take 3 mLs (2.5 mg total) by nebulization  every 6 (six) hours as needed for wheezing or shortness of breath. 11/30/15   Particia Nearing, PA-C  amiodarone (PACERONE) 200 MG tablet TAKE 1 TABLET BY MOUTH ONCE A DAY Patient taking differently: TAKE 1 TABLET BY MOUTH ONCE A DAY IN THE MORNING. 10/31/15   Particia Nearing, PA-C  apixaban (ELIQUIS) 5 MG TABS tablet Take 1 tablet (5 mg total) by mouth 2 (two) times daily. 12/20/15   Steele Sizer, MD  benzonatate (TESSALON) 100 MG capsule TAKE ONE CAPSULE BY MOUTH EVERY 8 HOURS AS NEEDED COUGH Patient taking differently: 100-200 mg 05/27/16   Steele Sizer, MD  calcium carbonate (TUMS - DOSED IN MG ELEMENTAL CALCIUM) 500 MG chewable tablet Chew 2 tablets by mouth daily with supper.     Historical Provider, MD  clindamycin (CLEOCIN) 150 MG capsule Take 2 capsules (300 mg total) by mouth 3 (three) times daily. 06/06/16 06/13/16  Merrily Brittle, MD  docusate sodium (COLACE) 100 MG capsule Take 100 mg by mouth 2 (two) times daily.     Historical Provider, MD  doxycycline (VIBRA-TABS) 100 MG tablet Take 1 tablet (100 mg total) by mouth 2 (two) times daily. 06/06/16   Steele Sizer, MD  fluticasone (FLONASE) 50 MCG/ACT nasal spray Place 1 spray into both nostrils daily. 08/22/15   Auburn Bilberry, MD  guaiFENesin (MUCINEX) 600 MG 12 hr tablet Take 1 tablet (600 mg total) by mouth 2 (two) times daily. 08/22/15   Auburn Bilberry, MD  guaiFENesin-codeine 100-10 MG/5ML syrup Take 5 mLs by mouth 3 (three) times daily as needed for cough. Patient not taking: Reported on 05/30/2016 04/01/16   Steele Sizer, MD  levETIRAcetam (KEPPRA) 500 MG tablet Take 1/2 tablet twice a day 01/15/16   Van Clines, MD  Multiple Vitamin (MULTIVITAMIN WITH MINERALS) TABS tablet Take 1 tablet by mouth daily with breakfast.     Historical Provider, MD  polyethylene glycol (MIRALAX / GLYCOLAX) packet Take 17 g by mouth daily.    Historical Provider, MD  VITAMIN D, CHOLECALCIFEROL, PO Take 1 tablet by mouth daily.     Historical Provider, MD    Allergies Penicillins; Latex; and Sulfonamide derivatives  Family History  Problem Relation Age of Onset  . Stroke Mother   . Diabetes Mother   . Diabetes Sister     Social History Social History  Substance Use Topics  . Smoking status: Former Smoker    Years: 20.00    Types: Cigarettes    Quit date: 09/12/1946  . Smokeless tobacco: Never Used     Comment: quit appox 50 yrs ago  . Alcohol use No    Review of Systems Constitutional: No fever/chills Eyes: No visual changes. ENT: No sore throat. Cardiovascular: Denies chest pain. Respiratory: Positive cough Gastrointestinal: No abdominal pain.  No nausea, no vomiting.  No diarrhea.  No constipation. Genitourinary: Negative for dysuria. Musculoskeletal:  Negative for back pain. Skin: Positive rash Neurological: Negative for headaches, focal weakness or numbness.  10-point ROS otherwise negative.  ____________________________________________   PHYSICAL EXAM:  VITAL SIGNS: ED Triage Vitals  Enc Vitals Group     BP 06/06/16 1306 107/65     Pulse Rate 06/06/16 1306 68     Resp 06/06/16 1306 18     Temp 06/06/16 1306 97.9 F (36.6 C)     Temp src --      SpO2 06/06/16 1306 100 %     Weight 06/06/16 1306 198 lb (89.8 kg)     Height 06/06/16 1306  (1.778 m)     Head Circumference --      Peak Flow --      Pain Score 06/06/16 1305 7     Pain Loc --      Pain Edu? --      Excl. in GC? --     Constitutional: Pleasant cooperative appears somewhat short of breath coughing during the exam Eyes: PERRL EOMI. Head: Atraumatic. Nose: No congestion/rhinnorhea. Mouth/Throat: No trismus Neck: No stridor.   Cardiovascular: Normal rate, regular rhythm. Grossly normal heart sounds.  Good peripheral circulation. Respiratory: Slightly increased respiratory effort with coarse breath sounds throughout and coughing Gastrointestinal: Soft nondistended nontender no rebound no guarding no  peritonitis no McBurney's tenderness negative Rovsing's no costovertebral tenderness negative Murphy's Musculoskeletal: Bilateral lower extremities with 2+ edema legs are equal in size. No discomfort with logroll of either hip Neurologic:  Normal speech and language. No gross focal neurologic deficits are appreciated. Skin:  6 cm x 3 cm area of erythema warmth and tenderness over the lateral aspect of his left ankle with no crepitus noted discomfort with range of motion of the ankle    ____________________________________________  ____________________________________________   LABS (all labs ordered are listed, but only abnormal results are displayed)  Labs Reviewed - No data to display   __________________________________________  EKG   ____________________________________________  RADIOLOGY  Ultrasound with no evidence of deep vein thrombus. X-ray of the hips with no fracture but is suggestive of possible chronic avascular necrosis ____________________________________________   PROCEDURES  Procedure(s) performed: no  Procedures  Critical Care performed: no  ____________________________________________   INITIAL IMPRESSION / ASSESSMENT AND PLAN / ED COURSE  Pertinent labs & imaging results that were available during my care of the patient were reviewed by me and considered in my medical decision making (see chart for details).  The patient has a 6 cm x 3 cm area of redness warmth and tenderness on the left lateral ankle which is consistent with a non-necrotizing soft tissue infection. He has allergies to Bactrim so we'll treat him with clindamycin to cover staph and strep. First dose given here. He is discharged home in improved condition.      ____________________________________________   FINAL CLINICAL IMPRESSION(S) / ED DIAGNOSES  Final diagnoses:  Cellulitis of other specified site      NEW MEDICATIONS STARTED DURING THIS VISIT:  Discharge  Medication List as of 06/06/2016  3:13 PM    START taking these medications   Details  clindamycin (CLEOCIN) 150 MG capsule Take 2 capsules (300 mg total) by mouth 3 (three) times daily., Starting Thu 06/06/2016, Until Thu 06/13/2016, Print         Note:  This document was prepared using Dragon voice recognition software and may include unintentional dictation errors.     Merrily Brittle, MD 06/07/16 [phone]

## 2016-06-06 NOTE — ED Triage Notes (Signed)
Pt being seen at [facility] for hip pain with movement, sent here for red, hardened warm area on his left ankle area for r/o dvt

## 2016-06-06 NOTE — ED Notes (Signed)
Spoke with dr Don Perking - put in hip xr and [place] of ankle hold blood work for now

## 2016-06-06 NOTE — Telephone Encounter (Signed)
Please advise 

## 2016-06-06 NOTE — Telephone Encounter (Signed)
[name] called and stated the pt was still coughing up yellowish green phlegm and she would like to know if he could have something sent to  [facility]

## 2016-06-10 ENCOUNTER — Other Ambulatory Visit: Payer: Self-pay | Admitting: Family Medicine

## 2016-06-11 ENCOUNTER — Telehealth: Payer: Self-pay | Admitting: Family Medicine

## 2016-06-11 NOTE — Telephone Encounter (Signed)
Patients daughters need FMLA forms filled out by next week.  Renee dropped off the forms.  I put them in Dr Zerita Boers box.  Thanks

## 2016-06-12 NOTE — Telephone Encounter (Signed)
Will fill out forms and place in provider's office.

## 2016-06-14 ENCOUNTER — Telehealth: Payer: Self-pay

## 2016-06-14 NOTE — Telephone Encounter (Signed)
OK for verbal order 

## 2016-06-14 NOTE — Telephone Encounter (Signed)
Routing to provider  

## 2016-06-14 NOTE — Telephone Encounter (Addendum)
Verdon Cummins from [facility] called and stated patients respitory infection has been clearing up. However, they stated the patient is very weak. Nurse was wondering if they could have an order for PT. Number is [phone]

## 2016-06-16 NOTE — Telephone Encounter (Signed)
yes

## 2016-06-17 NOTE — Telephone Encounter (Signed)
Message relayed to patient. Verbalized understanding and denied questions.   

## 2016-06-18 ENCOUNTER — Telehealth: Payer: Self-pay | Admitting: Family Medicine

## 2016-06-18 NOTE — Telephone Encounter (Signed)
Verbal given 

## 2016-06-18 NOTE — Telephone Encounter (Signed)
Lemuel from [facility] called to get a verbal for the patient to have homehealth PT to help with strengthening to help patient to get strength to prevent weakening to worsen   First time a week twice and second week once  [place] [phone]  Thanks

## 2016-06-19 ENCOUNTER — Encounter: Payer: Self-pay | Admitting: Family Medicine

## 2016-06-19 ENCOUNTER — Ambulatory Visit (INDEPENDENT_AMBULATORY_CARE_PROVIDER_SITE_OTHER): Payer: Medicare Other | Admitting: Family Medicine

## 2016-06-19 VITALS — BP 98/60 | HR 99

## 2016-06-19 DIAGNOSIS — I779 Disorder of arteries and arterioles, unspecified: Secondary | ICD-10-CM

## 2016-06-19 DIAGNOSIS — E7800 Pure hypercholesterolemia, unspecified: Secondary | ICD-10-CM

## 2016-06-19 DIAGNOSIS — I724 Aneurysm of artery of lower extremity: Secondary | ICD-10-CM

## 2016-06-19 DIAGNOSIS — I739 Peripheral vascular disease, unspecified: Secondary | ICD-10-CM

## 2016-06-19 DIAGNOSIS — R569 Unspecified convulsions: Secondary | ICD-10-CM | POA: Diagnosis not present

## 2016-06-19 NOTE — Progress Notes (Signed)
BP 98/60   Pulse 99   SpO2 91%    Subjective:    Patient ID: [name] [name], male    DOB: 18-Nov-1921, 83 y.o.   MRN: [id number]  HPI: [name] [name] is a 83 y.o. male  Chief Complaint  Patient presents with  . Follow-up   Patient accompanied by his daughter who provides history Patient wrapping up treatment for cellulitis left lateral ankle which is doing okay. Patient also still coughing some from intermittent chronic bronchitis has finished up antibiotics for this also but still coughing. Reviewed chest x-ray from last month showing bibasilar atelectasis otherwise normal. Reviewed of the chart aspects in and out of the emergency room for various conditions patient wheelchair-bound. Relevant past medical, surgical, family and social history reviewed and updated as indicated. Interim medical history since our last visit reviewed. Allergies and medications reviewed and updated.  Review of Systems  Constitutional: Negative.   Respiratory: Negative.   Cardiovascular: Negative.     Per HPI unless specifically indicated above     Objective:    BP 98/60   Pulse 99   SpO2 91%   Wt Readings from Last 3 Encounters:  06/06/16 198 lb (89.8 kg)  05/30/16 198 lb 14.4 oz (90.2 kg)  01/02/16 215 lb (97.5 kg)    Physical Exam  Constitutional: He is oriented to person, place, and time. He appears well-developed and well-nourished.  HENT:  Head: Normocephalic and atraumatic.  Eyes: Conjunctivae and EOM are normal.  Neck: Normal range of motion.  Cardiovascular: Normal rate, regular rhythm and normal heart sounds.   Pulmonary/Chest: Effort normal and breath sounds normal.  Poor respiratory effort  Musculoskeletal: Normal range of motion.  Neurological: He is alert and oriented to person, place, and time.  Skin: No erythema.  Cellulitis left lateral lower leg clearing  Psychiatric: He has a normal mood and affect. His behavior is normal. Judgment and thought content normal.     Results for orders placed or performed during the hospital encounter of 05/30/16  Comprehensive metabolic panel  Result Value Ref Range   Sodium 133 (L) 135 - 145 mmol/L   Potassium 4.2 3.5 - 5.1 mmol/L   Chloride 97 (L) 101 - 111 mmol/L   CO2 29 22 - 32 mmol/L   Glucose, Bld 122 (H) 65 - 99 mg/dL   BUN 13 6 - 20 mg/dL   Creatinine, Ser 4.09 (L) 0.61 - 1.24 mg/dL   Calcium 8.8 (L) 8.9 - 10.3 mg/dL   Total Protein 6.6 6.5 - 8.1 g/dL   Albumin 3.0 (L) 3.5 - 5.0 g/dL   AST 26 15 - 41 U/L   ALT 17 17 - 63 U/L   Alkaline Phosphatase 54 38 - 126 U/L   Total Bilirubin 0.7 0.3 - 1.2 mg/dL   GFR calc non Af Amer >60 >60 mL/min   GFR calc Af Amer >60 >60 mL/min   Anion gap 7 5 - 15  CBC with Differential/Platelet  Result Value Ref Range   WBC 6.9 3.8 - 10.6 K/uL   RBC 4.26 (L) 4.40 - 5.90 MIL/uL   Hemoglobin 12.7 (L) 13.0 - 18.0 g/dL   HCT 81.1 (L) 91.4 - 78.2 %   MCV 87.2 80.0 - 100.0 fL   MCH 29.8 26.0 - 34.0 pg   MCHC 34.1 32.0 - 36.0 g/dL   RDW 95.6 (H) 21.3 - 08.6 %   Platelets 194 150 - 440 K/uL   Neutrophils Relative % 83 %  Neutro Abs 5.8 1.4 - 6.5 K/uL   Lymphocytes Relative 10 %   Lymphs Abs 0.7 (L) 1.0 - 3.6 K/uL   Monocytes Relative 4 %   Monocytes Absolute 0.3 0.2 - 1.0 K/uL   Eosinophils Relative 2 %   Eosinophils Absolute 0.1 0 - 0.7 K/uL   Basophils Relative 1 %   Basophils Absolute 0.0 0 - 0.1 K/uL  Troponin I  Result Value Ref Range   Troponin I <0.03 <0.03 ng/mL  Urinalysis, Complete w Microscopic  Result Value Ref Range   Color, Urine YELLOW (A) YELLOW   APPearance CLOUDY (A) CLEAR   Specific Gravity, Urine 1.013 1.005 - 1.030   pH 7.0 5.0 - 8.0   Glucose, UA NEGATIVE NEGATIVE mg/dL   Hgb urine dipstick SMALL (A) NEGATIVE   Bilirubin Urine NEGATIVE NEGATIVE   Ketones, ur NEGATIVE NEGATIVE mg/dL   Protein, ur NEGATIVE NEGATIVE mg/dL   Nitrite NEGATIVE NEGATIVE   Leukocytes, UA NEGATIVE NEGATIVE   RBC / HPF 6-30 0 - 5 RBC/hpf   WBC, UA 0-5 0 -  5 WBC/hpf   Bacteria, UA NONE SEEN NONE SEEN   Squamous Epithelial / LPF NONE SEEN NONE SEEN   Mucous PRESENT   Brain natriuretic peptide  Result Value Ref Range   B Natriuretic Peptide 221.0 (H) 0.0 - 100.0 pg/mL      Assessment & Plan:   Problem List Items Addressed This Visit      Cardiovascular and Mediastinum   Carotid artery disease (HCC)    Condition stable      Popliteal aneurysm (HCC)    Condition stable      PAD (peripheral artery disease) (HCC)    Condition stable      Popliteal artery aneurysm (HCC)    Condition stable        Other   Hyperlipidemia - Primary   Relevant Orders   Basic metabolic panel   CBC with Differential/Platelet   Seizure (HCC)    Stable seizures        Discuss for health and home may be time to consider hospice.  Follow up plan: Return in about 6 months (around 12/19/2016) for Physical Exam.

## 2016-06-19 NOTE — Assessment & Plan Note (Signed)
Stable seizures

## 2016-06-19 NOTE — Assessment & Plan Note (Signed)
Condition stable  

## 2016-06-20 LAB — BASIC METABOLIC PANEL
BUN / CREAT RATIO: 18 (ref 10–24)
BUN: 14 mg/dL (ref 10–36)
CALCIUM: 9.2 mg/dL (ref 8.6–10.2)
CHLORIDE: 94 mmol/L — AB (ref 96–106)
CO2: 29 mmol/L (ref 18–29)
Creatinine, Ser: 0.76 mg/dL (ref 0.76–1.27)
GFR calc Af Amer: 92 mL/min/{1.73_m2} (ref >59)
GFR, EST NON AFRICAN AMERICAN: 79 mL/min/{1.73_m2} (ref >59)
Glucose: 111 mg/dL — ABNORMAL HIGH (ref 65–99)
POTASSIUM: 4.6 mmol/L (ref 3.5–5.2)
Sodium: 135 mmol/L (ref 134–144)

## 2016-06-20 LAB — CBC WITH DIFFERENTIAL/PLATELET
BASOS: 0 %
Basophils Absolute: 0 10*3/uL (ref 0.0–0.2)
EOS (ABSOLUTE): 0.2 10*3/uL (ref 0.0–0.4)
Eos: 3 %
Hematocrit: 37.1 % — ABNORMAL LOW (ref 37.5–51.0)
Hemoglobin: 12 g/dL — ABNORMAL LOW (ref 13.0–17.7)
IMMATURE GRANS (ABS): 0.1 10*3/uL (ref 0.0–0.1)
Immature Granulocytes: 1 %
LYMPHS ABS: 1.6 10*3/uL (ref 0.7–3.1)
LYMPHS: 18 %
MCH: 28.4 pg (ref 26.6–33.0)
MCHC: 32.3 g/dL (ref 31.5–35.7)
MCV: 88 fL (ref 79–97)
MONOCYTES: 5 %
Monocytes Absolute: 0.4 10*3/uL (ref 0.1–0.9)
Neutrophils Absolute: 6.6 10*3/uL (ref 1.4–7.0)
Neutrophils: 73 %
Platelets: 261 10*3/uL (ref 150–379)
RBC: 4.23 x10E6/uL (ref 4.14–5.80)
RDW: 17.6 % — ABNORMAL HIGH (ref 12.3–15.4)
WBC: 8.8 10*3/uL (ref 3.4–10.8)

## 2016-06-24 ENCOUNTER — Encounter: Payer: Self-pay | Admitting: Family Medicine

## 2016-06-24 ENCOUNTER — Telehealth: Payer: Self-pay | Admitting: Family Medicine

## 2016-06-24 MED ORDER — DOXYCYCLINE HYCLATE 100 MG PO TABS: 100.0000 mg | ORAL_TABLET | Freq: Two times a day (BID) | ORAL | 1 refills | Status: DC

## 2016-06-24 NOTE — Telephone Encounter (Signed)
Patient's daughter [name] [name]) called to advise that [name] [name] is still not feeling better since his visit from last week.  Caller stated that her father is seems weak, was hard to wake up this morning due to a chronic cough while trying to sleep last night. Daughter also stated that [name] [name]'s sputum is yellow and she wondered if he would benefit from an antibiotic.  Please call [name] [name]'s daughter at [phone]  to advise.

## 2016-06-24 NOTE — Telephone Encounter (Signed)
Please advise 

## 2016-06-24 NOTE — Telephone Encounter (Signed)
Phone call Discussed with patient's daughter still having bronchitis changes and worsening doxycycline seemed to help the most. Discussed making patient comfortable which we will do with doxycycline and observe response.

## 2016-06-27 ENCOUNTER — Other Ambulatory Visit: Payer: Self-pay | Admitting: Family Medicine

## 2016-06-27 ENCOUNTER — Telehealth: Payer: Self-pay | Admitting: Family Medicine

## 2016-06-27 NOTE — Telephone Encounter (Signed)
[name] [name] with [facility] called to get verbal orders for 3 more weekly nursing visits on patient.  He is recovering from respiratory infection and also has stage II pressure ulcers.  Thanks [employer]  [phone]

## 2016-06-27 NOTE — Telephone Encounter (Signed)
Verbal given 

## 2016-07-12 ENCOUNTER — Telehealth: Payer: Self-pay | Admitting: Family Medicine

## 2016-07-12 MED ORDER — AZITHROMYCIN 250 MG PO TABS: ORAL_TABLET | ORAL | 0 refills | Status: DC

## 2016-07-12 NOTE — Addendum Note (Signed)
Addended by: Dorcas CarrowJOHNSON, Allister Lessley P on: 07/12/2016 04:44 PM   Modules accepted: Orders

## 2016-07-12 NOTE — Telephone Encounter (Signed)
Rx for z-pack sent to his pharmacy.

## 2016-07-12 NOTE — Telephone Encounter (Signed)
Routing to [place] and Dr.Johnson to have one of them review since Dr. Salena Saner is out of the office.

## 2016-07-12 NOTE — Telephone Encounter (Signed)
Patient's daughter notified that antibiotic was sent to the pharmacy. I advised her that if he isn't improving by Monday, to let us know. She understood and agreed.

## 2016-07-12 NOTE — Telephone Encounter (Signed)
Spoke with daughter, she states that he did finish the doxy. He started getting worse about 2 days later.  He saw Dr. Welton FlakesKhan today and they did an ekg and don't think his issues are CHF. She said Dr.Khan had recommended that he be given a Zpack or Levaquin. I advised her that we had not gotten any note from his office and that we can't prescribe any antibiotic without him being seen first. She said she did not want to take her dad back out this weekend because they have to arrange transportation. I advised her to call Dr. Milta DeitersKhan's office and see if they would be willing to write the antibiotic and she said they had told her no. She is going to see how her dad does over the weekend and call us back on Monday if he isn't improving.

## 2016-07-12 NOTE — Telephone Encounter (Signed)
Amy, looking at the chart it looks like he's been on antibiotics since 4/23- can we confirm this. If he's really not getting better on that, he will really need to be seen and have a CXR as it may not be bronchitis. I see nothing in the chart from the cardiologist (I also checked Care Everywhere) but if he did see him today, I would suggest that they ask him for the antibiotic. Thanks!

## 2016-07-12 NOTE — Telephone Encounter (Signed)
Patients daughter called regarding patient having a very bad cough with wheeze.  He was seen by his cardiologist today and he was sending over to Dr Dossie Arbourrissman some antibiotics suggestions.    I did explain Dr Dossie Arbourrissman is away until Monday but I would send this message.  Thanks

## 2016-07-19 ENCOUNTER — Telehealth: Payer: Self-pay | Admitting: Family Medicine

## 2016-07-19 NOTE — Telephone Encounter (Signed)
Message relayed to patient. Verbalized understanding and denied questions.   Will route to Dr. Dossie Arbourrissman to review on Monday.

## 2016-07-19 NOTE — Telephone Encounter (Signed)
Patient's daughter wants to speak to a provider about which direction her father's  Care is going. Daughter stated hat patient was just diagnosed with pneumonia and would like to speak with Fleet Contrasachel lane today regarding patient's plan of care. Patient is aware that Dr. Dossie Arbourrissman comes back  Monday 07/22/2016, but wants to speak with Roosvelt Maserachel Lane as she would like provider feedback.  Please Advise.  Thank you

## 2016-07-19 NOTE — Telephone Encounter (Signed)
Agree that it can wait for Dr. Dossie Arbourrissman, report is consistent with previous showing bibasilar atelectasis and scarring. Please call and give return precautions of fever, chills, new SOB, etc.

## 2016-07-19 NOTE — Telephone Encounter (Signed)
Received fax from [facility] today with Chest Xray results.   Results state: "Heart size difficult to evaluate in the post CABG patient. Elevation of left hemidiaphragm with infiltrate, scar, or atelectasis at the left base. Minimal infiltrate, scar, or atelectasis at the right base as well. Recommend follow up to establish stability or resolution.   Impression: Bibasilar infiltrate, scare, or atelectasis."  Results were viewed by Dr. Laural BenesJohnson to ensure that it could wait for Dr. Dossie Arbourrissman on Monday. Report is on provider's desk. Will route message to S[place]achel. Please advise.

## 2016-07-22 ENCOUNTER — Telehealth: Payer: Self-pay

## 2016-07-22 NOTE — Telephone Encounter (Signed)
Call daughter

## 2016-07-22 NOTE — Telephone Encounter (Signed)
They are going to be d/c'ing his skilled nursing services. His bronchitis is improving, but seems to be chronic and his wound that he had is healed. There are in other services they can provider at this time. He did want us to know that he has some red spots on his skin x 1 week. It doesn't look or act like a pressure ulcer. It is a blanchable area. It doesn't require nursing intervention at this time.

## 2016-07-23 ENCOUNTER — Ambulatory Visit: Payer: Medicare Other | Admitting: Neurology

## 2016-07-23 NOTE — Telephone Encounter (Signed)
Call daughter

## 2016-07-23 NOTE — Telephone Encounter (Signed)
Transferred to provider

## 2016-07-23 NOTE — Telephone Encounter (Signed)
Left message on machine for pt to return call to the office.  

## 2016-07-23 NOTE — Telephone Encounter (Signed)
Discussed with patient's daughter will continue supportive care no need for treatment with antibiotics.

## 2016-08-07 ENCOUNTER — Ambulatory Visit: Payer: Medicare Other | Admitting: Vascular Surgery

## 2016-08-07 ENCOUNTER — Ambulatory Visit (HOSPITAL_COMMUNITY): Payer: Medicare Other

## 2016-08-19 ENCOUNTER — Emergency Department: Payer: Medicare Other

## 2016-08-19 ENCOUNTER — Encounter: Payer: Self-pay | Admitting: Emergency Medicine

## 2016-08-19 ENCOUNTER — Observation Stay: Payer: Medicare Other

## 2016-08-19 ENCOUNTER — Observation Stay
Admission: EM | Admit: 2016-08-19 | Discharge: 2016-08-20 | Disposition: A | Discharge status: Hospice | Payer: Medicare Other | Attending: Internal Medicine | Admitting: Internal Medicine

## 2016-08-19 DIAGNOSIS — Z951 Presence of aortocoronary bypass graft: Secondary | ICD-10-CM | POA: Insufficient documentation

## 2016-08-19 DIAGNOSIS — Z7901 Long term (current) use of anticoagulants: Secondary | ICD-10-CM | POA: Insufficient documentation

## 2016-08-19 DIAGNOSIS — E871 Hypo-osmolality and hyponatremia: Secondary | ICD-10-CM

## 2016-08-19 DIAGNOSIS — E784 Other hyperlipidemia: Secondary | ICD-10-CM | POA: Diagnosis not present

## 2016-08-19 DIAGNOSIS — Z7951 Long term (current) use of inhaled steroids: Secondary | ICD-10-CM | POA: Diagnosis not present

## 2016-08-19 DIAGNOSIS — G319 Degenerative disease of nervous system, unspecified: Secondary | ICD-10-CM | POA: Diagnosis not present

## 2016-08-19 DIAGNOSIS — R4189 Other symptoms and signs involving cognitive functions and awareness: Secondary | ICD-10-CM

## 2016-08-19 DIAGNOSIS — I2510 Atherosclerotic heart disease of native coronary artery without angina pectoris: Secondary | ICD-10-CM | POA: Insufficient documentation

## 2016-08-19 DIAGNOSIS — I4891 Unspecified atrial fibrillation: Secondary | ICD-10-CM | POA: Insufficient documentation

## 2016-08-19 DIAGNOSIS — R05 Cough: Secondary | ICD-10-CM | POA: Insufficient documentation

## 2016-08-19 DIAGNOSIS — Z79899 Other long term (current) drug therapy: Secondary | ICD-10-CM | POA: Diagnosis not present

## 2016-08-19 DIAGNOSIS — F0390 Unspecified dementia without behavioral disturbance: Secondary | ICD-10-CM | POA: Insufficient documentation

## 2016-08-19 DIAGNOSIS — I739 Peripheral vascular disease, unspecified: Secondary | ICD-10-CM | POA: Diagnosis not present

## 2016-08-19 DIAGNOSIS — L89209 Pressure ulcer of unspecified hip, unspecified stage: Secondary | ICD-10-CM | POA: Insufficient documentation

## 2016-08-19 DIAGNOSIS — R4182 Altered mental status, unspecified: Secondary | ICD-10-CM | POA: Diagnosis not present

## 2016-08-19 DIAGNOSIS — G459 Transient cerebral ischemic attack, unspecified: Secondary | ICD-10-CM | POA: Diagnosis present

## 2016-08-19 DIAGNOSIS — Z87891 Personal history of nicotine dependence: Secondary | ICD-10-CM | POA: Insufficient documentation

## 2016-08-19 DIAGNOSIS — Z8673 Personal history of transient ischemic attack (TIA), and cerebral infarction without residual deficits: Secondary | ICD-10-CM | POA: Diagnosis not present

## 2016-08-19 DIAGNOSIS — R531 Weakness: Secondary | ICD-10-CM | POA: Diagnosis present

## 2016-08-19 DIAGNOSIS — R29898 Other symptoms and signs involving the musculoskeletal system: Secondary | ICD-10-CM

## 2016-08-19 DIAGNOSIS — R059 Cough, unspecified: Secondary | ICD-10-CM

## 2016-08-19 LAB — CBC
HCT: 38.9 % — ABNORMAL LOW (ref 40.0–52.0)
Hemoglobin: 12.8 g/dL — ABNORMAL LOW (ref 13.0–18.0)
MCH: 29 pg (ref 26.0–34.0)
MCHC: 32.9 g/dL (ref 32.0–36.0)
MCV: 88.2 fL (ref 80.0–100.0)
PLATELETS: 192 10*3/uL (ref 150–440)
RBC: 4.41 MIL/uL (ref 4.40–5.90)
RDW: 16 % — AB (ref 11.5–14.5)
WBC: 6.8 10*3/uL (ref 3.8–10.6)

## 2016-08-19 LAB — DIFFERENTIAL
BASOS ABS: 0 10*3/uL (ref 0–0.1)
BASOS PCT: 1 %
Eosinophils Absolute: 0.1 10*3/uL (ref 0–0.7)
Eosinophils Relative: 2 %
LYMPHS PCT: 12 %
Lymphs Abs: 0.8 10*3/uL — ABNORMAL LOW (ref 1.0–3.6)
Monocytes Absolute: 0.3 10*3/uL (ref 0.2–1.0)
Monocytes Relative: 4 %
NEUTROS ABS: 5.6 10*3/uL (ref 1.4–6.5)
NEUTROS PCT: 83 %

## 2016-08-19 LAB — PROTIME-INR
INR: 1.62
PROTHROMBIN TIME: 19.4 s — AB (ref 11.4–15.2)

## 2016-08-19 LAB — COMPREHENSIVE METABOLIC PANEL
ALBUMIN: 2.9 g/dL — AB (ref 3.5–5.0)
ALK PHOS: 58 U/L (ref 38–126)
ALT: 26 U/L (ref 17–63)
AST: 32 U/L (ref 15–41)
Anion gap: 8 (ref 5–15)
BUN: 14 mg/dL (ref 6–20)
CALCIUM: 8.7 mg/dL — AB (ref 8.9–10.3)
CHLORIDE: 97 mmol/L — AB (ref 101–111)
CO2: 28 mmol/L (ref 22–32)
CREATININE: 0.63 mg/dL (ref 0.61–1.24)
GFR calc non Af Amer: >60 mL/min (ref >60)
GLUCOSE: 116 mg/dL — AB (ref 65–99)
Potassium: 4.1 mmol/L (ref 3.5–5.1)
SODIUM: 133 mmol/L — AB (ref 135–145)
Total Bilirubin: 0.7 mg/dL (ref 0.3–1.2)
Total Protein: 6.5 g/dL (ref 6.5–8.1)

## 2016-08-19 LAB — GLUCOSE, CAPILLARY: Glucose-Capillary: 111 mg/dL — ABNORMAL HIGH (ref 65–99)

## 2016-08-19 LAB — APTT: APTT: 35 s (ref 24–36)

## 2016-08-19 LAB — TROPONIN I: Troponin I: <0.03 ng/mL (ref <0.03)

## 2016-08-19 MED ORDER — ADULT MULTIVITAMIN W/MINERALS CH
1.0000 | ORAL_TABLET | Freq: Every day | ORAL | Status: DC
  Administered 2016-08-20: 1 via ORAL
  Filled 2016-08-19: qty 1

## 2016-08-19 MED ORDER — CALCIUM CARBONATE ANTACID 500 MG PO CHEW
2.0000 | CHEWABLE_TABLET | Freq: Every day | ORAL | Status: DC
  Administered 2016-08-19: 22:00:00 400 mg via ORAL
  Filled 2016-08-19: qty 2

## 2016-08-19 MED ORDER — SODIUM CHLORIDE 0.9 % IV SOLN
INTRAVENOUS | Status: DC
  Administered 2016-08-19: 22:00:00 via INTRAVENOUS

## 2016-08-19 MED ORDER — LEVETIRACETAM 250 MG PO TABS: 250.0000 mg | ORAL_TABLET | Freq: Two times a day (BID) | ORAL | Status: DC

## 2016-08-19 MED ORDER — ACETAMINOPHEN 325 MG PO TABS: 650.0000 mg | ORAL_TABLET | ORAL | Status: DC | PRN

## 2016-08-19 MED ORDER — ACETAMINOPHEN 650 MG RE SUPP: 650.0000 mg | RECTAL | Status: DC | PRN

## 2016-08-19 MED ORDER — LEVETIRACETAM 250 MG PO TABS
250.0000 mg | ORAL_TABLET | Freq: Every morning | ORAL | Status: DC
Start: 2016-08-20 — End: 2016-08-20
  Administered 2016-08-20: 11:00:00 250 mg via ORAL
  Filled 2016-08-19: qty 1

## 2016-08-19 MED ORDER — DOCUSATE SODIUM 100 MG PO CAPS
100.0000 mg | ORAL_CAPSULE | Freq: Two times a day (BID) | ORAL | Status: DC
Start: 2016-08-19 — End: 2016-08-20
  Administered 2016-08-19 – 2016-08-20 (×2): 100 mg via ORAL
  Filled 2016-08-19 (×2): qty 1

## 2016-08-19 MED ORDER — AMIODARONE HCL 200 MG PO TABS
200.0000 mg | ORAL_TABLET | Freq: Every day | ORAL | Status: DC
  Administered 2016-08-20: 200 mg via ORAL
  Filled 2016-08-19: qty 1

## 2016-08-19 MED ORDER — ALBUTEROL SULFATE (2.5 MG/3ML) 0.083% IN NEBU: 2.5000 mg | INHALATION_SOLUTION | Freq: Four times a day (QID) | RESPIRATORY_TRACT | Status: DC | PRN

## 2016-08-19 MED ORDER — ASPIRIN 325 MG PO TABS
325.0000 mg | ORAL_TABLET | Freq: Every day | ORAL | Status: DC
  Filled 2016-08-19: qty 1

## 2016-08-19 MED ORDER — GUAIFENESIN ER 600 MG PO TB12
600.0000 mg | ORAL_TABLET | Freq: Two times a day (BID) | ORAL | Status: DC
  Administered 2016-08-19 – 2016-08-20 (×2): 600 mg via ORAL
  Filled 2016-08-19 (×2): qty 1

## 2016-08-19 MED ORDER — STROKE: EARLY STAGES OF RECOVERY BOOK
Freq: Once | Status: AC
  Administered 2016-08-19: 18:00:00

## 2016-08-19 MED ORDER — APIXABAN 5 MG PO TABS
5.0000 mg | ORAL_TABLET | Freq: Two times a day (BID) | ORAL | Status: DC
  Administered 2016-08-19 – 2016-08-20 (×2): 5 mg via ORAL
  Filled 2016-08-19 (×2): qty 1

## 2016-08-19 MED ORDER — ASPIRIN 300 MG RE SUPP: 300.0000 mg | Freq: Every day | RECTAL | Status: DC

## 2016-08-19 MED ORDER — LEVETIRACETAM 500 MG PO TABS
500.0000 mg | ORAL_TABLET | Freq: Every evening | ORAL | Status: DC
  Administered 2016-08-19: 500 mg via ORAL
  Filled 2016-08-19: qty 1

## 2016-08-19 MED ORDER — ACETAMINOPHEN 160 MG/5ML PO SOLN: 650.0000 mg | ORAL | Status: DC | PRN

## 2016-08-19 NOTE — ED Notes (Signed)
Patient transported to CT 

## 2016-08-19 NOTE — ED Provider Notes (Signed)
[facility] Emergency Department Provider Note  ____________________________________________  Time seen: Approximately 1:24 PM  I have reviewed the triage vital signs and the nursing notes.   HISTORY  Chief Complaint Weakness  Limited due to the patient's dementia.  HPI [name] [name] is a 83 y.o. male with a history of A. fib, CAD, remote history of intracranial hemorrhage, TIA presenting for decreased responsiveness. The patient has been in his usual state of health, living at home with 24-hour care, and was found slumped over in his wheelchair with his eyes rolled in the back of his head. Since then, he has become increasingly more responsive. Per his daughter, he has not slept for the past 2 days because of cough, but he has not been febrile or short of breath. No witnessed seizure or urinary incontinence. No recent changes in medications. No other infectious symptoms including foul-smelling urine, urinary frequency, pain with urination, nausea vomiting or diarrhea, abdominal pain. There is no history of chest pain, shortness of breath.The patient denies any pain at this time.   Past Medical History:  Diagnosis Date  . A-fib (HCC)   . Bradycardia    06/2010  . CAD (coronary artery disease)    nuclear 01/2008, no ischemia, EF 60%, distal anterior scar  . Carotid artery disease (HCC)    doppler 05/2009,  0-39% bilateral / doppler 06/2010 OK  . Edema    Ankle edema August, 2012  . Ejection fraction    EF 45%, echo,05/2009,apical hypo,  ?? posterior and lateral hypo  . Elevated CPK    11/23/08.Marland KitchenMarland Kitchenprobably from debris from popliteal aneurysm  . Fall at home July 2015  . Hx of CABG    1998  . Hyperlipidemia   . Intracranial hemorrhage (HCC)    Two small subependymal bleeds 06/2010,,ASA held  . PAD (peripheral artery disease) (HCC)    Above the knee to below the knee popliteal bypass,, right  //    Doppler,  July, 2013, patent  . Popliteal aneurysm (HCC)     .repair..11/25/2008  Dr. Darrick Penna  . Prominent abdominal aortic pulsation    doppler normal  . Speech problem    Neurologic event with brief infusion and speech difficulty.... January, 2011.... being assessed by neurology  February, 2011  . Syncope    .remote..no etiology  /  syncope 11/2008.. from acute leg pain  . TIA (transient ischemic attack)    Brief episode of confusion and speech difficulty, January, 2012  /  recurrent confusion speech difficulty September, 2012  . Venous insufficiency    support hose    Patient Active Problem List   Diagnosis Date Noted  . Anemia 03/19/2016  . Seizure (HCC) 10/05/2015  . A-fib (HCC) 10/05/2015  . Chronic systolic (congestive) heart failure (HCC) 09/07/2015  . Altered mental status 08/18/2015  . CAD in native artery 08/07/2015  . Arterial disease (HCC) 08/07/2015  . HLD (hyperlipidemia) 08/07/2015  . Temporary cerebral vascular dysfunction 08/07/2015  . Chronic venous insufficiency 08/07/2015  . H/O neoplasm 08/22/2014  . H/O malignant neoplasm of skin 08/22/2014  . 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 Peripheral vascular disease (HCC) 02/28/2014  . Postsurgical aortocoronary bypass status 02/28/2014  . Syncope and collapse 02/28/2014  . 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 PAD (peripheral artery disease) (HCC)   . TIA (transient ischemic attack)   . Edema   . Hyperlipidemia   . Syncope   .  CAD (coronary artery disease)   . Venous insufficiency   . Carotid artery disease (HCC)   . Elevated CPK   . Popliteal aneurysm (HCC)   . Hx of CABG   . Intracranial hemorrhage [facility](HCC)     Past Surgical History:  Procedure Laterality Date  . CORONARY ARTERY BYPASS GRAFT  1998  . popliteal bypass  with reversed greater saphenous vein from right leg.      Current Outpatient Rx  . Order #: [id number] Class: Normal  . Order #: [id number] Class: Normal  .  Order #: [id number] Class: Normal  . Order #: [id number] Class: OTC  . Order #: [id number] Class: Print  . Order #: [id number] Class: Normal  . Order #: [id number] Class: Normal  . Order #: [id number] Class: Historical Med  . Order #: [id number] Class: Historical Med  . Order #: [id number] Class: Normal  . Order #: [id number] Class: No Print  . Order #: [id number] Class: No Print  . Order #: [id number] Class: Normal  . Order #: [id number] Class: Historical Med  . Order #: [id number] Class: Historical Med  . Order #: [id number] Class: Historical Med    Allergies Penicillins; Latex; and Sulfonamide derivatives  Family History  Problem Relation Age of Onset  . Stroke Mother   . Diabetes Mother   . Diabetes Sister     Social History Social History  Substance Use Topics  . Smoking status: Former Smoker    Years: 20.00    Types: Cigarettes    Quit date: 09/12/1946  . Smokeless tobacco: Never Used     Comment: quit appox 50 yrs ago  . Alcohol use No    Review of Systems Constitutional: No fever/chills.No lightheadedness or syncope. Positive decreased responsiveness. No known trauma. Eyes: No visual changes. Has ever eyes rolled back into his head. ENT: No sore throat. No congestion or rhinorrhea. Cardiovascular: Denies chest pain. Denies palpitations. Respiratory: Denies shortness of breath.  No cough. Gastrointestinal: No abdominal pain.  No nausea, no vomiting.  No diarrhea.  No constipation. Genitourinary: Negative for dysuria. Musculoskeletal: Negative for back pain. Chronic lower extremity edema that is unchanged. Skin: Negative for rash. Neurological: Negative for headaches. No focal numbness, tingling or weakness.     ____________________________________________   PHYSICAL EXAM:  VITAL SIGNS: ED Triage Vitals [08/19/16 1307]  Enc Vitals Group     BP 126/73     Pulse Rate (!) 59     Resp 16     Temp 97.4 F (36.3 C)     Temp Source Oral     SpO2 95 %     Weight 200 lb (90.7 kg)      Height 5\' 11"  (1.803 m)     Head Circumference      Peak Flow      Pain Score      Pain Loc      Pain Edu?      Excl. in GC?     Constitutional: The patient is somnolent but arouses easily to voice and answers questions although has some dementia. He confuses his daughter for his sister; she states that earlier today she knew who she was and he does know her name. This is a not unusual for his baseline mental status. GCS is 15. Eyes: Conjunctivae are normal.  EOMI. PERRLA. No scleral icterus. No eye discharge. Head: Atraumatic. No Battle sign. No raccoon eyes. Nose: No congestion/rhinnorhea. Mouth/Throat: Mucous membranes are mildly dry.  Neck: No stridor.  Supple.  No JVD. No meningismus. Cardiovascular: Normal rate, regular rhythm. No murmurs, rubs or gallops.  Respiratory: Normal respiratory effort.  No accessory muscle use or retractions. Lungs CTAB.  No wheezes, rales or ronchi. Gastrointestinal: Soft, nontender and mildly distended.  No guarding or rebound.  No peritoneal signs. Musculoskeletal: No LE edema. No ttp in the calves or palpable cords.  Negative Homan's sign. Neurologic:  somnolent but arousable to voice.Marland Kitchen  Speech is clear.  Face and smile are symmetric.  EOMI. PERRLA. 5 out of 5 bilateral grip strength, biceps and triceps. 5 out of 5 right dorsiflexion and plantar flexion. 3 out of 5 left dorsiflexion and plantar flexion; the patient does withdraw to pain in the left lower extremity. Unable to comply with sensory examination.  Skin:  Skin is warm, dry and intact. No rash noted. Psychiatric: Mood and affect are normal.  ____________________________________________   LABS (all labs ordered are listed, but only abnormal results are displayed)  Labs Reviewed  PROTIME-INR - Abnormal; Notable for the following:       Result Value   Prothrombin Time 19.4 (*)    All other components within normal limits  CBC - Abnormal; Notable for the following:    Hemoglobin 12.8  (*)    HCT 38.9 (*)    RDW 16.0 (*)    All other components within normal limits  DIFFERENTIAL - Abnormal; Notable for the following:    Lymphs Abs 0.8 (*)    All other components within normal limits  COMPREHENSIVE METABOLIC PANEL - Abnormal; Notable for the following:    Sodium 133 (*)    Chloride 97 (*)    Glucose, Bld 116 (*)    Calcium 8.7 (*)    Albumin 2.9 (*)    All other components within normal limits  GLUCOSE, CAPILLARY - Abnormal; Notable for the following:    Glucose-Capillary 111 (*)    All other components within normal limits  APTT  TROPONIN I  URINALYSIS, COMPLETE (UACMP) WITH MICROSCOPIC  CBG MONITORING, ED   ____________________________________________  EKG  ED ECG REPORT I, Rockne Menghini, the attending physician, personally viewed and interpreted this ECG.   Date: 08/19/2016  EKG Time: 1328  Rate: 62  Rhythm: normal sinus rhythm  Axis: leftward  Intervals:first degree block and prolonged QTc  ST&T Change: No STEMI  ____________________________________________  RADIOLOGY  Ct Head Code Stroke W/o Cm  Result Date: 08/19/2016 CLINICAL DATA:  Code stroke. Found in wheelchair slumped over. Minimal responsiveness. EXAM: CT HEAD WITHOUT CONTRAST TECHNIQUE: Contiguous axial images were obtained from the base of the skull through the vertex without intravenous contrast. COMPARISON:  05/30/2016. FINDINGS: Brain: No evidence for acute stroke, acute hemorrhage, mass lesion, or extra-axial fluid. Marked ventricular enlargement out of proportion to the degree of cerebral or cerebellar atrophy, concerning for normal pressure hydrocephalus. Vascular: No hyperdense vessel. Marked vascular calcification of the skull base internal carotid artery's and distal vertebral arteries. Skull: Normal. Negative for fracture or focal lesion. Sinuses/Orbits: No acute finding.  RIGHT cataract extraction. Other: None ASPECTS ([place] Stroke Program Early CT Score) - Ganglionic  level infarction (caudate, lentiform nuclei, internal capsule, insula, M1-M3 cortex): 7 - Supraganglionic infarction (M4-M6 cortex): 3 Total score (0-10 with 10 being normal): 10 IMPRESSION: 1. Marked ventricular enlargement out of proportion to the degree of cerebral volume loss, similar to the previous scan in March 2018. Concern raised for normal pressure hydrocephalus. No acute intracranial findings. 2. ASPECTS is 10 These results were called by telephone at the time of interpretation on 08/19/2016 at 1:41 pm to Dr. Rockne Menghini , who  verbally acknowledged these results. Electronically Signed   By: Elsie Stain M.D.   On: 08/19/2016 13:44    ____________________________________________   PROCEDURES  Procedure(s) performed: None  Procedures  Critical Care performed: No ____________________________________________   INITIAL IMPRESSION / ASSESSMENT AND PLAN / ED COURSE  Pertinent labs & imaging results that were available during my care of the patient were reviewed by me and considered in my medical decision making (see chart for details).  83 y.o. male with multiple chronic medical illnesses, history of TIA, presenting with decreased responsiveness in the setting of not having slept for the past 2 nights. It is possible the patient was just somnolent. However, I am concerned that the patient is not moving his left lower extremity as well as the rest of his extremities. We'll get a CT scan to evaluate for acute CVA. The patient will also be evaluated for ACS or MI of this is less likely without any symptoms or ischemic changes on EKG. Rule out UTI. Anticipate admission for further evaluation and treatment.  ----------------------------------------- 3:13 PM on 08/19/2016 -----------------------------------------  The patient's workup in the emergency department has been reassuring. He has continued to remain hemodynamically stable although he occasionally goes into a sinus  bradycardia in the high 50s. His blood sugar is within normal limits. He is mildly hyponatremic which is likely due to some baseline dehydration. His troponin is negative. His CT scan does not show any stroke. There is concern for enlarged ventricles and I have passed this along to the hospitalist, with normal pressure hydrocephalus on the differential although the rest of the patient's symptoms are not necessarily consistent with this. Plan admission at this time.  ____________________________________________  FINAL CLINICAL IMPRESSION(S) / ED DIAGNOSES  Final diagnoses:  Hyponatremia  Decreased responsiveness  Left leg weakness  Cough  Ventricular enlargement due to brain atrophy         NEW MEDICATIONS STARTED DURING THIS VISIT:  New Prescriptions   No medications on file      Rockne Menghini, MD 08/19/16 1514

## 2016-08-19 NOTE — H&P (Signed)
[facility] - [place] at [facility]   PATIENT NAME: [name] [name]    MR#:  [id number]  DATE OF BIRTH:  [birth year]  DATE OF ADMISSION:  08/19/2016  PRIMARY CARE PHYSICIAN: Steele Sizer, MD   REQUESTING/REFERRING PHYSICIAN:   CHIEF COMPLAINT:   Chief Complaint  Patient presents with  . Weakness    HISTORY OF PRESENT ILLNESS: [name] [name]  is a 83 y.o. male with a known history of Atrial fibrillation, coronary artery disease, carotid artery disease, hyperlipidemia, peripheral arterial disease presented to the emergency room with decreased responsiveness and left lower extremity weakness. Patient lives alone at home is 24-hour care services. His daughter visits him on a regular basis. According to her patient has had decreased power in the left lower extremity for the last 1 day. He is mostly bedbound and uses wheelchair to ambulate at home. He was worked up with a CT head in the emergency room which showed no acute intracranial abnormality such as hemorrhage and infarct. Patients family complaints of cough that has been lingering around for last few months it is dry in nature. No history of any shortness of breath, orthopnea.  PAST MEDICAL HISTORY:   Past Medical History:  Diagnosis Date  . A-fib (HCC)   . Bradycardia    06/2010  . CAD (coronary artery disease)    nuclear 01/2008, no ischemia, EF 60%, distal anterior scar  . Carotid artery disease (HCC)    doppler 05/2009,  0-39% bilateral / doppler 06/2010 OK  . Edema    Ankle edema August, 2012  . Ejection fraction    EF 45%, echo,05/2009,apical hypo,  ?? posterior and lateral hypo  . Elevated CPK    11/23/08.Marland KitchenMarland Kitchenprobably from debris from popliteal aneurysm  . Fall at home July 2015  . Hx of CABG    1998  . Hyperlipidemia   . Intracranial hemorrhage (HCC)    Two small subependymal bleeds 06/2010,,ASA held  . PAD (peripheral artery disease) (HCC)    Above the knee to below the knee popliteal bypass,, right   //    Doppler,  July, 2013, patent  . Popliteal aneurysm (HCC)    .repair..11/25/2008  Dr. Darrick Penna  . Prominent abdominal aortic pulsation    doppler normal  . Speech problem    Neurologic event with brief infusion and speech difficulty.... January, 2011.... being assessed by neurology  February, 2011  . Syncope    .remote..no etiology  /  syncope 11/2008.. from acute leg pain  . TIA (transient ischemic attack)    Brief episode of confusion and speech difficulty, January, 2012  /  recurrent confusion speech difficulty September, 2012  . Venous insufficiency    support hose    PAST SURGICAL HISTORY: Past Surgical History:  Procedure Laterality Date  . CORONARY ARTERY BYPASS GRAFT  1998  . popliteal bypass  with reversed greater saphenous vein from right leg.      SOCIAL HISTORY:  Social History  Substance Use Topics  . Smoking status: Former Smoker    Years: 20.00    Types: Cigarettes    Quit date: 09/12/1946  . Smokeless tobacco: Never Used     Comment: quit appox 50 yrs ago  . Alcohol use No    FAMILY HISTORY:  Family History  Problem Relation Age of Onset  . Stroke Mother   . Diabetes Mother   . Diabetes Sister     DRUG ALLERGIES:  Allergies  Allergen Reactions  . Penicillins Hives  Has patient had a PCN reaction causing immediate rash, facial/tongue/throat swelling, SOB or lightheadedness with hypotension: no Has patient had a PCN reaction causing severe rash involving mucus membranes or skin necrosis: no Has patient had a PCN reaction that required hospitalization no Has patient had a PCN reaction occurring within the last 10 years: no If all of the above answers are "NO", then may proceed with Cephalosporin use.   . Latex Rash  . Sulfonamide Derivatives Rash    REVIEW OF SYSTEMS:   CONSTITUTIONAL: No fever, has weakness.  EYES: No blurred or double vision.  EARS, NOSE, AND THROAT: No tinnitus or ear pain.  RESPIRATORY: No cough, shortness of breath,  wheezing or hemoptysis.  CARDIOVASCULAR: No chest pain, orthopnea, edema.  GASTROINTESTINAL: No nausea, vomiting, diarrhea or abdominal pain.  GENITOURINARY: No dysuria, hematuria.  ENDOCRINE: No polyuria, nocturia,  HEMATOLOGY: No anemia, easy bruising or bleeding SKIN: No rash or lesion. MUSCULOSKELETAL: No joint pain or arthritis.   NEUROLOGIC: No tingling, numbness, weakness left lower extremity.  PSYCHIATRY: No anxiety or depression.    MEDICATIONS AT HOME:  Prior to Admission medications   Medication Sig Start Date End Date Taking? Authorizing Provider  acetaminophen (TYLENOL) 325 MG tablet Take 2 tablets (650 mg total) by mouth every 6 (six) hours as needed for mild pain (or Fever >/= 101). 10/08/15  Yes Gouru, Aruna, MD  albuterol (PROVENTIL) (2.5 MG/3ML) 0.083% nebulizer solution Take 3 mLs (2.5 mg total) by nebulization every 6 (six) hours as needed for wheezing or shortness of breath. 11/30/15  Yes Particia Nearing, PA-C  amiodarone (PACERONE) 200 MG tablet TAKE 1 TABLET BY MOUTH ONCE A DAY Patient taking differently: TAKE 1 TABLET BY MOUTH ONCE A DAY IN THE MORNING. 10/31/15  Yes Particia Nearing, PA-C  apixaban (ELIQUIS) 5 MG TABS tablet Take 1 tablet (5 mg total) by mouth 2 (two) times daily. 12/20/15  Yes Crissman, Redge Gainer, MD  benzonatate (TESSALON) 100 MG capsule TAKE ONE CAPSULE BY MOUTH EVERY 8 HOURS AS NEEDED COUGH 06/27/16  Yes Crissman, Redge Gainer, MD  calcium carbonate (TUMS - DOSED IN MG ELEMENTAL CALCIUM) 500 MG chewable tablet Chew 2 tablets by mouth daily with supper.    Yes [provider]  docusate sodium (COLACE) 100 MG capsule Take 100 mg by mouth 2 (two) times daily.    Yes [provider]  fluticasone (FLONASE) 50 MCG/ACT nasal spray Place 1 spray into both nostrils daily. 08/22/15  Yes Auburn Bilberry, MD  guaiFENesin (MUCINEX) 600 MG 12 hr tablet Take 1 tablet (600 mg total) by mouth 2 (two) times daily. 08/22/15  Yes Auburn Bilberry, MD   levETIRAcetam (KEPPRA) 500 MG tablet Take 1/2 tablet twice a day Patient taking differently: Take 250-500 mg by mouth 2 (two) times daily. Take 250 mg in the morning and 500 mg at night. 01/15/16  Yes Van Clines, MD  Multiple Vitamin (MULTIVITAMIN WITH MINERALS) TABS tablet Take 1 tablet by mouth daily with breakfast.    Yes [provider]  polyethylene glycol (MIRALAX / GLYCOLAX) packet Take 17 g by mouth daily.   Yes [provider]  Vitamin D, Cholecalciferol, 1000 units TABS Take 2,000 tablets by mouth daily.    Yes [provider]  azithromycin (ZITHROMAX) 250 MG tablet 2 tabs today, 1 tab daily for 4 days Patient not taking: Reported on 08/19/2016 07/12/16   Olevia Perches P, DO  doxycycline (VIBRA-TABS) 100 MG tablet Take 1 tablet (100 mg total)  by mouth 2 (two) times daily. Patient not taking: Reported on 08/19/2016 06/24/16   Steele Sizerrissman, Mark A, MD  levETIRAcetam (KEPPRA) 500 MG tablet TAKE 1 TABLET (500 MG TOTAL) BY MOUTH 2 (TWO) TIMES DAILY. Patient not taking: Reported on 08/19/2016 06/10/16   Olevia PerchesJohnson, Megan P, DO      PHYSICAL EXAMINATION:   VITAL SIGNS: Blood pressure 130/78, pulse (!) 59, temperature 97.4 F (36.3 C), temperature source Oral, resp. rate 13, height 5\' 11"  (1.803 m), weight 90.7 kg (200 lb), SpO2 95 %.  GENERAL:  83 y.o.-year-old patient lying in the bed with no acute distress.  EYES: Pupils equal, round, reactive to light and accommodation. No scleral icterus. Extraocular muscles intact.  HEENT: Head atraumatic, normocephalic. Oropharynx and nasopharynx clear.  NECK:  Supple, no jugular venous distention. No thyroid enlargement, no tenderness.  LUNGS: Normal breath sounds bilaterally, no wheezing, rales,scattered rhonchi noted. No use of accessory muscles of respiration.  CARDIOVASCULAR: S1, S2 normal. No murmurs, rubs, or gallops.  ABDOMEN: Soft, nontender, nondistended. Bowel sounds present. No organomegaly or mass.  EXTREMITIES: No  pedal edema, cyanosis, or clubbing.  NEUROLOGIC: Cranial nerves II through XII are intact. Muscle strength 4/5 in left lower extremity, 5/5 all other extremities. Sensation intact. Gait not checked.  PSYCHIATRIC: The patient is alert and oriented x 3.  SKIN: No obvious rash, lesion, or ulcer.   LABORATORY PANEL:   CBC  Recent Labs Lab 08/19/16 1317  WBC 6.8  HGB 12.8*  HCT 38.9*  PLT 192  MCV 88.2  MCH 29.0  MCHC 32.9  RDW 16.0*  LYMPHSABS 0.8*  MONOABS 0.3  EOSABS 0.1  BASOSABS 0.0   ------------------------------------------------------------------------------------------------------------------  Chemistries   Recent Labs Lab 08/19/16 1317  NA 133*  K 4.1  CL 97*  CO2 28  GLUCOSE 116*  BUN 14  CREATININE 0.63  CALCIUM 8.7*  AST 32  ALT 26  ALKPHOS 58  BILITOT 0.7   ------------------------------------------------------------------------------------------------------------------ estimated creatinine clearance is 67.9 mL/min (by C-G formula based on SCr of 0.63 mg/dL). ------------------------------------------------------------------------------------------------------------------ No results for input(s): TSH, T4TOTAL, T3FREE, THYROIDAB in the last 72 hours.  Invalid input(s): FREET3   Coagulation profile  Recent Labs Lab 08/19/16 1317  INR 1.62   ------------------------------------------------------------------------------------------------------------------- No results for input(s): DDIMER in the last 72 hours. -------------------------------------------------------------------------------------------------------------------  Cardiac Enzymes  Recent Labs Lab 08/19/16 1317  TROPONINI <0.03   ------------------------------------------------------------------------------------------------------------------ Invalid input(s):  POCBNP  ---------------------------------------------------------------------------------------------------------------  Urinalysis    Component Value Date/Time   COLORURINE YELLOW (A) 05/30/2016 1313   APPEARANCEUR CLOUDY (A) 05/30/2016 1313   APPEARANCEUR Clear 12/22/2015 1550   LABSPEC 1.013 05/30/2016 1313   PHURINE 7.0 05/30/2016 1313   GLUCOSEU NEGATIVE 05/30/2016 1313   HGBUR SMALL (A) 05/30/2016 1313   BILIRUBINUR NEGATIVE 05/30/2016 1313   BILIRUBINUR Negative 12/22/2015 1550   KETONESUR NEGATIVE 05/30/2016 1313   PROTEINUR NEGATIVE 05/30/2016 1313   UROBILINOGEN 1.0 06/26/2010 2144   NITRITE NEGATIVE 05/30/2016 1313   LEUKOCYTESUR NEGATIVE 05/30/2016 1313   LEUKOCYTESUR Negative 12/22/2015 1550     RADIOLOGY: Dg Chest 2 View  Result Date: 08/19/2016 CLINICAL DATA:  Weakness. EXAM: CHEST  2 VIEW COMPARISON:  05/30/2016. FINDINGS: Prior CABG. Cardiomegaly. Chronic interstitial changes noted bilaterally. No focal acute infiltrate. Calcified pulmonary nodules most likely granulomas. No pleural effusion or pneumothorax. Stomach is distended. IMPRESSION: 1. Prior CABG.  Stable cardiomegaly. 2. Chronic interstitial lung disease.  No acute abnormality. 3. Gastric distention. Electronically Signed   By: Maisie Fushomas  Register   On: 08/19/2016  15:42   Ct Head Code Stroke W/o Cm  Result Date: 08/19/2016 CLINICAL DATA:  Code stroke. Found in wheelchair slumped over. Minimal responsiveness. EXAM: CT HEAD WITHOUT CONTRAST TECHNIQUE: Contiguous axial images were obtained from the base of the skull through the vertex without intravenous contrast. COMPARISON:  05/30/2016. FINDINGS: Brain: No evidence for acute stroke, acute hemorrhage, mass lesion, or extra-axial fluid. Marked ventricular enlargement out of proportion to the degree of cerebral or cerebellar atrophy, concerning for normal pressure hydrocephalus. Vascular: No hyperdense vessel. Marked vascular calcification of the skull base  internal carotid artery's and distal vertebral arteries. Skull: Normal. Negative for fracture or focal lesion. Sinuses/Orbits: No acute finding.  RIGHT cataract extraction. Other: None ASPECTS (Alberta Stroke Program Early CT Score) - Ganglionic level infarction (caudate, lentiform nuclei, internal capsule, insula, M1-M3 cortex): 7 - Supraganglionic infarction (M4-M6 cortex): 3 Total score (0-10 with 10 being normal): 10 IMPRESSION: 1. Marked ventricular enlargement out of proportion to the degree of cerebral volume loss, similar to the previous scan in March 2018. Concern raised for normal pressure hydrocephalus. No acute intracranial findings. 2. ASPECTS is 10 These results were called by telephone at the time of interpretation on 08/19/2016 at 1:41 pm to Dr. Rockne Menghini , who verbally acknowledged these results. Electronically Signed   By: Elsie Stain M.D.   On: 08/19/2016 13:44    EKG: Orders placed or performed during the hospital encounter of 08/19/16  . ED EKG  . ED EKG  . EKG 12-Lead  . EKG 12-Lead    IMPRESSION AND PLAN:  83 year old male patient with history of atrial fibrillation, coronary artery disease, carotid artery disease, hyperlipidemia, TIA presented to the emergency room with generalized weakness and decreased responsiveness he was also noted to have increased left lower extremity weakness . Assessment and plan 1. Transient ischemic attack Admit patient to medical floor   check MRI of the brain, carotid ultrasound Check echocardiogram Aspirin 325 MG orally daily Continue oral liquids for antecolic patient Physical therapy evaluation Neuro check every 4 hourly  2. Altered mental status improved  3. Coronary artery disease Continue aspirin  4. Hyperlipidemia Start statin medication     All the records are reviewed and case discussed with ED provider. Management plans discussed with the patient, family and they are in agreement.  CODE STATUS:FULL  CODE Code Status History    Date Active Date Inactive Code Status Order ID Comments User Context   10/06/2015 12:31 AM 10/08/2015  7:28 PM Full Code [id number]  Oralia Manis, MD ED   08/17/2015  1:14 AM 08/17/2015  1:14 AM Full Code [id number]  Joella Prince, MD ED   08/17/2015  1:14 AM 08/22/2015  6:03 PM Full Code [id number]  Joella Prince, MD ED       TOTAL TIME TAKING CARE OF THIS PATIENT: 50 minutes.    Ihor Austin M.D on 08/19/2016 at 5:31 PM  Between 7am to 6pm - Pager - [phone]  After 6pm go to [email or web address] - password [email or web address] [facility]  [facility]  Office  [phone]  CC: Primary care physician; Steele Sizer, MD

## 2016-08-19 NOTE — ED Notes (Signed)
Pt transported to room 116. 

## 2016-08-19 NOTE — ED Triage Notes (Signed)
Pt via ems from home after being found in his wheelchair slumped over and minimally responsive. Daughter states that pt's eyes rolled back in his head and he slumped over. Pt had similar symptoms in March. Pt alert & minimally oriented.

## 2016-08-19 NOTE — ED Notes (Signed)
In and out cath done for urine specimen.  No urine returned.  Bladder scanner says about 100 ml.  Pt cleaned, diaper changed and urinal placed to catch any urine.  Pt propped on right side.

## 2016-08-20 ENCOUNTER — Observation Stay
Admit: 2016-08-20 | Discharge: 2016-08-20 | Disposition: A | Discharge status: Home / Self Care | Payer: Medicare Other | Attending: Internal Medicine | Admitting: Internal Medicine

## 2016-08-20 ENCOUNTER — Observation Stay: Payer: Medicare Other

## 2016-08-20 DIAGNOSIS — L89209 Pressure ulcer of unspecified hip, unspecified stage: Secondary | ICD-10-CM | POA: Insufficient documentation

## 2016-08-20 LAB — LIPID PANEL
CHOLESTEROL: 142 mg/dL (ref 0–200)
HDL: 48 mg/dL (ref >40)
LDL Cholesterol: 84 mg/dL (ref 0–99)
Total CHOL/HDL Ratio: 3 RATIO
Triglycerides: 48 mg/dL (ref <150)
VLDL: 10 mg/dL (ref 0–40)

## 2016-08-20 NOTE — Evaluation (Signed)
Physical Therapy Evaluation Patient Details Name: [name] [name] MRN: [id number] DOB: [birth year] Today's Date: 08/20/2016   History of Present Illness  83 y.o. male pt presenting to ED on 6/18 for TIA work up (pt found slumped over in w/c with eyes rolled back; concern for L LE weakness). PMHx significant for A-fib, TIA, CAD, carotid artery disease, HLD, PAD, CABG, remote h/o intracranial hemorrhage.  Clinical Impression  Prior to hospital admission, pt required 1-2 assist with w/c level functional mobility and utilized a hydraulic sit to stand lift for transfers to manual w/c.  Pt lives with his wife in 1 level home with ramp to enter; has 24/7 caregivers.  Currently pt is max assist with logrolling in bed.  Reviewed LE exercises during session and provided education on importance of strength required to utilize sit to stand lift (also discussed possible transition to hoyer lift if pt requiring increased assist needs).  Pt would benefit from skilled PT to prevent decline in strength during hospitalization in order to continue to safely utilize sit to stand lift at home.  Upon hospital discharge, recommend pt discharge to home with prior level of support/services.  No further PT needs anticipated upon hospital discharge.    Follow Up Recommendations No PT follow up    Equipment Recommendations  Other (comment) (Pt has required DME at home already)    Recommendations for Other Services       Precautions / Restrictions Precautions Precautions: Fall Restrictions Weight Bearing Restrictions: No      Mobility  Bed Mobility Overal bed mobility: Needs Assistance Bed Mobility: Rolling Rolling: Max assist         General bed mobility comments: assist to initiate logroll L and R (pt able to hold onto bedrail once positioned)  Transfers Overall transfer level: Needs assistance               General transfer comment: max assist-total assist x2 baseline level per caregiver report -  family/caregivers use sit to stand lift at home  Ambulation/Gait             General Gait Details: Pt non-ambulatory baseline.  Stairs            Wheelchair Mobility    Modified Rankin (Stroke Patients Only)       Balance Overall balance assessment: Needs assistance     Sitting balance - Comments: max assist to maintain at baseline per pt caregiver report (not tested during session)       Standing balance comment: Unable per pt caregiver report                             Pertinent Vitals/Pain Pain Assessment: No/denies pain    Home Living Family/patient expects to be discharged to:: Private residence Living Arrangements: Spouse/significant other;Other (Comment) (24/7 caregivers) Available Help at Discharge: Available 24 hours/day;Personal care attendant Type of Home: House Home Access: Ramped entrance     Home Layout: One level Home Equipment: Walker - 2 wheels;Cane - single point;Bedside commode;Wheelchair - manual;Hospital bed;Other (comment) (hydraulic sit to stand lift)      Prior Function Level of Independence: Needs assistance   Gait / Transfers Assistance Needed: Pt requires 1-2 assist for bed mobility, sitting balance (posterior lean), and uses hydraulic sit to stand lift for transfers (always has 2 person caregiver assist for mobility for safety).  Caregiver pushes pt in manual w/c and pt sits up in w/c 2x's/day.  Pt  has left lean sitting in w/c but able to maintain sitting balance with positioning help.  ADL's / Homemaking Assistance Needed: Per OT eval:  Pt required assist with ADLs  at home. Pt took sponge baths with caregivers.  Has 24/7 caregiver assist x2 for all mobility and self care tasks, occasionally feeds himself with caregivers assisting when he is "weaker"  Comments: No falls in past 12 months.  Pt's wife is bedbound.     Hand Dominance   Dominant Hand: Right    Extremity/Trunk Assessment   Upper Extremity  Assessment Upper Extremity Assessment: Defer to OT evaluation    Lower Extremity Assessment Lower Extremity Assessment: RLE deficits/detail;LLE deficits/detail RLE Deficits / Details: R LE with increased edema compared to L LE; DF at least 3/5; fair quad set; at least 2/5 hip flexion and abduction.  DF to neutral. Hip and knee flexion/extension WFL. LLE Deficits / Details: some L LE edema noted; DF at least 3/5; fair quad set; at least 2/5 hip flexion and abduction.  DF to neutral.  Hip and knee flexion/extension WFL.       Communication   Communication: HOH  Cognition Arousal/Alertness: Awake/alert Behavior During Therapy: WFL for tasks assessed/performed Overall Cognitive Status: Within Functional Limits for tasks assessed                                        General Comments   Nursing cleared pt for participation in physical therapy.  Pt agreeable to PT session.    Exercises Total Joint Exercises Ankle Circles/Pumps: AROM;Strengthening;Both;10 reps;Supine Quad Sets: AROM;Strengthening;Both;10 reps;Supine Short Arc Quad: AAROM;Strengthening;Both;10 reps;Supine Heel Slides: AAROM;Strengthening;Both;10 reps;Supine Hip ABduction/ADduction: AAROM;Strengthening;Both;10 reps;Supine   Assessment/Plan    PT Assessment Patient needs continued PT services  PT Problem List Decreased strength;Decreased mobility       PT Treatment Interventions Therapeutic activities;Therapeutic exercise;Patient/family education    PT Goals (Current goals can be found in the Care Plan section)  Acute Rehab PT Goals Patient Stated Goal: go home PT Goal Formulation: With patient/family Time For Goal Achievement: 09/03/16 Potential to Achieve Goals: Good    Frequency Min 2X/week   Barriers to discharge        Co-evaluation               AM-PAC PT "6 Clicks" Daily Activity  Outcome Measure Difficulty turning over in bed (including adjusting bedclothes, sheets and  blankets)?: Total Difficulty moving from lying on back to sitting on the side of the bed? : Total Difficulty sitting down on and standing up from a chair with arms (e.g., wheelchair, bedside commode, etc,.)?: Total Help needed moving to and from a bed to chair (including a wheelchair)?: Total Help needed walking in hospital room?: Total Help needed climbing 3-5 steps with a railing? : Total 6 Click Score: 6    End of Session   Activity Tolerance: Patient tolerated treatment well Patient left: in bed;with call bell/phone within reach;with bed alarm set;with family/visitor present Nurse Communication: Mobility status;Precautions PT Visit Diagnosis: Other abnormalities of gait and mobility (R26.89);Muscle weakness (generalized) (M62.81)    Time: [id number] PT Time Calculation (min) (ACUTE ONLY): 22 min   Charges:   PT Evaluation $PT Eval Low Complexity: 1 Procedure     PT G Codes:   PT G-Codes **NOT FOR INPATIENT CLASS** Functional Assessment Tool Used: AM-PAC 6 Clicks Basic Mobility Functional Limitation: Mobility: Walking and moving around Mobility:  Walking and Moving Around Current Status [phone]): 100 percent impaired, limited or restricted Mobility: Walking and Moving Around Goal Status [phone]): At least 80 percent but less than 100 percent impaired, limited or restricted    Hendricks Limes, PT 08/20/16, 4:50 PM [phone]

## 2016-08-20 NOTE — Care Management Obs Status (Signed)
MEDICARE OBSERVATION STATUS NOTIFICATION   Patient Details  Name: [name] [name] MRN: [id number] Date of Birth: 05/22/1921   Medicare Observation Status Notification Given:  Yes    Gwenette GreetBrenda S Inez Rosato, RN 08/20/2016, 10:28 AM

## 2016-08-20 NOTE — Progress Notes (Signed)
Pt d/ced home.  MRI negative for acute CVA.  U/S carotids showed no severe stenosis.  Patient has 24 hour care.  Nurse tech removed IV. Pt worked with PT today - really at baseline.  Unable to do much - they use a lift to get him to the wheelchair.  Patient had no complaint of pain.  Caregiver was at the bedside all day.  Patient also met with Speech.  Reviewed d/c instructions with patient's daughter who is also a Software engineer.

## 2016-08-20 NOTE — Care Management (Signed)
Admitted to this facility with the diagnosis of TIA under observation status. Daughter is Luster LandsbergRenee [phone]). Last seen Dr. Dossie Arbourrissman 06/19/16. [facility] [place]. [facility] and [facility] in the past. [facility] and [place] in the past. Wheelchair, rolling walker, nebulizer, bedside commode, and Life Alert in the home.  24/7 caregivers in the home. Caregivers help with personal care. Family will transport.  Stoke work-up in progress. Gwenette GreetBrenda S Rumor Sun RN MSN CCM Care Management [phone]

## 2016-08-20 NOTE — Evaluation (Signed)
Occupational Therapy Evaluation Patient Details Name: [name] [name] MRN: [id number] DOB: 06/11/1921 Today's Date: 08/20/2016    History of Present Illness 83yo male pt presenting to ED on 6/18 for TIA. Work up in process. PMHx significant for Afib, TIA, CAD, carotid artery disease, HLD, PAD, CABG.   Clinical Impression   Pt seen for OT evaluation this date. Pt required 24/7 caregiver assist (+2) for self care tasks and functional mobility at home, using a sit to stand lift for transfers. Pt presents with deficits in strength and activity tolerance impacting his ability to participate in ADL including self feeding, requiring caregiver assist to self feed. Pt able to demonstrate during session ability to perform. Education provided to pt/family/caregiver in adapted utensils and encouragement to support pt in self feeding as much as he can independently in order to maintain current level of function and minimize further decline and need for increased caregiver assist. All verbalized understanding. Pt/family/caregiver educated in ROM/stretching and strengthening BUE exercises to improve participation in ADL. Pt/family/caregiver confidence in ability to carry over strategies and education provided to home environment. Pt with excellent set up and supports in place at home. No additional OT needs identified or recommended at this time.     Follow Up Recommendations  Supervision/Assistance - 24 hour    Equipment Recommendations  None recommended by OT    Recommendations for Other Services       Precautions / Restrictions Precautions Precautions: Fall Restrictions Weight Bearing Restrictions: No      Mobility Bed Mobility Overal bed mobility: Needs Assistance             General bed mobility comments: max assist-total assist baseline level  Transfers Overall transfer level: Needs assistance               General transfer comment: max assist-total assist baseline level - family  uses sit to stand lift at home    Balance Overall balance assessment: Needs assistance     Sitting balance - Comments: max assist to maintain at baseline       Standing balance comment: unable                           ADL either performed or assessed with clinical judgement   ADL Overall ADL's : Needs assistance/impaired Eating/Feeding: Bed level;Supervision/ safety;Minimal assistance;Set up Eating/Feeding Details (indicate cue type and reason): caregiver reports feeding pt lunch earlier, pt able to demonstrate ability to hold spoon and bring to mouth, provided caregiver/family education on built up handles, long handled utensils, larger/wider spoons to minimize spilling     Upper Body Bathing: Maximal assistance;Bed level Upper Body Bathing Details (indicate cue type and reason): baseline level Lower Body Bathing: Maximal assistance;Bed level Lower Body Bathing Details (indicate cue type and reason): baseline level Upper Body Dressing : Maximal assistance;Bed level Upper Body Dressing Details (indicate cue type and reason): baseline level Lower Body Dressing: Maximal assistance;Bed level Lower Body Dressing Details (indicate cue type and reason): baseline level             Functional mobility during ADLs:  (N/A, pt requires max assist for functional mobility, either in bed or sits in wc during the day) General ADL Comments: grossly at baseline functional level, subjectively weaker than baseline, although able to demonstrate self feeding skills. Encouraged pt/family to support pt in self feeding as independently as possible to maintain functional/physical status and prevent further decline.  Vision Baseline Vision/History: Wears glasses Wears Glasses: At all times (bifocals) Patient Visual Report: No change from baseline Vision Assessment?: No apparent visual deficits     Perception     Praxis      Pertinent Vitals/Pain Pain Assessment: No/denies pain      Hand Dominance Right   Extremity/Trunk Assessment Upper Extremity Assessment Upper Extremity Assessment: Generalized weakness (AROM approx 0-90, AAROM WFL, grossly 3/5 bilaterally, fair grip strength)   Lower Extremity Assessment Lower Extremity Assessment: Defer to PT evaluation;Generalized weakness       Communication Communication Communication: HOH   Cognition Arousal/Alertness: Awake/alert Behavior During Therapy: WFL for tasks assessed/performed Overall Cognitive Status: Within Functional Limits for tasks assessed                                     General Comments       Exercises Other Exercises Other Exercises: pt/caregiver/family educated in simple BUE ROM/stretching and strengthening exercises to perform at home to maintain strength for self feeding and other simple fine motor tasks   Shoulder Instructions      Home Living Family/patient expects to be discharged to:: Private residence Living Arrangements: Spouse/significant other;Other (Comment) (24/7 caregivers) Available Help at Discharge: Available 24 hours/day;Personal care attendant Type of Home: House Home Access: Ramped entrance     Home Layout: One level     Bathroom Shower/Tub: Estate manager/land agent Accessibility: Yes   Home Equipment: Environmental consultant - 2 wheels;Cane - single point;Bedside commode;Wheelchair - manual;Hospital bed;Other (comment) (hydraulic sit to stand lift)          Prior Functioning/Environment Level of Independence: Needs assistance  Gait / Transfers Assistance Needed: pt requires max assist for transfers and all aspects of functional mobility ADL's / Homemaking Assistance Needed: Pt. required assist with ADLs  at home. Pt. took sponge baths with caregivers. has 24/7 caregiver assist x2 for all mobility and self care tasks, occasionally feeds himself with caregivers assisting when he is "weaker"   Comments: no falls in past 12 months        OT  Problem List: Decreased strength;Impaired UE functional use;Impaired balance (sitting and/or standing);Decreased activity tolerance;Decreased range of motion      OT Treatment/Interventions:      OT Goals(Current goals can be found in the care plan section) Acute Rehab OT Goals Patient Stated Goal: go home OT Goal Formulation: All assessment and education complete, DC therapy  OT Frequency:     Barriers to D/C:            Co-evaluation              AM-PAC PT "6 Clicks" Daily Activity     Outcome Measure Help from another person eating meals?: A Little Help from another person taking care of personal grooming?: A Little Help from another person toileting, which includes using toliet, bedpan, or urinal?: A Lot Help from another person bathing (including washing, rinsing, drying)?: A Lot Help from another person to put on and taking off regular upper body clothing?: A Lot Help from another person to put on and taking off regular lower body clothing?: A Lot 6 Click Score: 14   End of Session    Activity Tolerance: Patient tolerated treatment well Patient left: in bed;with call bell/phone within reach;with bed alarm set;with family/visitor present  OT Visit Diagnosis: Muscle weakness (generalized) (M62.81)  Time: 1610-9604 OT Time Calculation (min): 23 min Charges:  OT General Charges $OT Visit: 1 Procedure OT Evaluation $OT Eval Low Complexity: 1 Procedure OT Treatments $Therapeutic Exercise: 8-22 mins G-Codes: OT G-codes **NOT FOR INPATIENT CLASS** Functional Assessment Tool Used: AM-PAC 6 Clicks Daily Activity;Clinical judgement Functional Limitation: Self care Self Care Current Status (V4098): At least 40 percent but less than 60 percent impaired, limited or restricted Self Care Goal Status (J1914): At least 40 percent but less than 60 percent impaired, limited or restricted Self Care Discharge Status [phone]): At least 40 percent but less than 60  percent impaired, limited or restricted   Richrd Prime, MPH, MS, OTR/L ascom [phone] 08/20/16, 3:39 PM

## 2016-08-20 NOTE — Progress Notes (Signed)
OT Cancellation Note  Patient Details Name: [name] [name] MRN: [id number] DOB: 12/26/1921   Cancelled Treatment:    Reason Eval/Treat Not Completed: Other (comment). Order received, chart reviewed. Pt sleeping soundly upon OT's arrival, son in law in room who reported pt didn't sleep much last night and agreeable to OT coming back later this morning to give pt more time to rest and eat breakfast prior to evaluation to support maximal participation. Will re-attempt OT evaluation later this morning.   Richrd PrimeJamie Stiller, MPH, MS, OTR/L ascom [phone] 08/20/16, 8:42 AM

## 2016-08-20 NOTE — Progress Notes (Signed)
OT Cancellation Note  Patient Details Name: [name] [name] MRN: [id number] DOB: 10/10/1921   Cancelled Treatment:    Reason Eval/Treat Not Completed: Patient at procedure or test/ unavailable. On second attempt, nursing in with patient to place catheter. Will re-attempt at later date/time as pt is available.  Richrd PrimeJamie Stiller, MPH, MS, OTR/L ascom [phone] 08/20/16, 11:04 AM

## 2016-08-20 NOTE — Discharge Summary (Signed)
[facility] - [place] at [facility]   PATIENT NAME: [name] [name]    MR#:  [id number]  DATE OF BIRTH:  Sep 11, 1921  DATE OF ADMISSION:  08/19/2016 ADMITTING PHYSICIAN: Ihor Austin, MD  DATE OF DISCHARGE: 08/20/2016  PRIMARY CARE PHYSICIAN: Steele Sizer, MD    ADMISSION DIAGNOSIS:  Cough [R05] Hyponatremia [E87.1] Left leg weakness [R29.898] Decreased responsiveness [R41.89] Ventricular enlargement due to brain atrophy [G31.9]  DISCHARGE DIAGNOSIS:  Decreased responsiveness/AMS-resolved--suspected TIA   SECONDARY DIAGNOSIS:   Past Medical History:  Diagnosis Date  . A-fib (HCC)   . Bradycardia    06/2010  . CAD (coronary artery disease)    nuclear 01/2008, no ischemia, EF 60%, distal anterior scar  . Carotid artery disease (HCC)    doppler 05/2009,  0-39% bilateral / doppler 06/2010 OK  . Edema    Ankle edema August, 2012  . Ejection fraction    EF 45%, echo,05/2009,apical hypo,  ?? posterior and lateral hypo  . Elevated CPK    11/23/08.Marland KitchenMarland Kitchenprobably from debris from popliteal aneurysm  . Fall at home July 2015  . Hx of CABG    1998  . Hyperlipidemia   . Intracranial hemorrhage (HCC)    Two small subependymal bleeds 06/2010,,ASA held  . PAD (peripheral artery disease) (HCC)    Above the knee to below the knee popliteal bypass,, right  //    Doppler,  July, 2013, patent  . Popliteal aneurysm (HCC)    .repair..11/25/2008  Dr. Darrick Penna  . Prominent abdominal aortic pulsation    doppler normal  . Speech problem    Neurologic event with brief infusion and speech difficulty.... January, 2011.... being assessed by neurology  February, 2011  . Syncope    .remote..no etiology  /  syncope 11/2008.. from acute leg pain  . TIA (transient ischemic attack)    Brief episode of confusion and speech difficulty, January, 2012  /  recurrent confusion speech difficulty September, 2012  . Venous insufficiency    support hose    HOSPITAL COURSE:  [name] [name]  is  a 83 y.o. male with a known history of Atrial fibrillation, coronary artery disease, carotid artery disease, hyperlipidemia, peripheral arterial disease presented to the emergency room with decreased responsiveness and left lower extremity weakness. Patient lives alone at home is 24-hour care services  1. Suspected Transient ischemic attack MRI braoin no acute CVA, Carotid US no severe stenosis Already on po eliquis Physical therapy evaluation not done since pt is bed bound  2. Altered mental status improved  3. Coronary artery disease Continue aspirin  4. Hyperlipidemia Start statin medication   Overall stable D/w dters in the room test results. CONSULTS OBTAINED:    DRUG ALLERGIES:   Allergies  Allergen Reactions  . Penicillins Hives    Has patient had a PCN reaction causing immediate rash, facial/tongue/throat swelling, SOB or lightheadedness with hypotension: no Has patient had a PCN reaction causing severe rash involving mucus membranes or skin necrosis: no Has patient had a PCN reaction that required hospitalization no Has patient had a PCN reaction occurring within the last 10 years: no If all of the above answers are "NO", then may proceed with Cephalosporin use.   . Latex Rash  . Sulfonamide Derivatives Rash    DISCHARGE MEDICATIONS:   Current Discharge Medication List    CONTINUE these medications which have NOT CHANGED   Details  acetaminophen (TYLENOL) 325 MG tablet Take 2 tablets (650 mg total) by mouth every 6 (  six) hours as needed for mild pain (or Fever >/= 101).    albuterol (PROVENTIL) (2.5 MG/3ML) 0.083% nebulizer solution Take 3 mLs (2.5 mg total) by nebulization every 6 (six) hours as needed for wheezing or shortness of breath. Qty: 150 mL, Refills: 6    amiodarone (PACERONE) 200 MG tablet TAKE 1 TABLET BY MOUTH ONCE A DAY Qty: 30 tablet, Refills: 0    apixaban (ELIQUIS) 5 MG TABS tablet Take 1 tablet (5 mg total) by mouth 2 (two) times  daily. Qty: 180 tablet, Refills: 4    benzonatate (TESSALON) 100 MG capsule TAKE ONE CAPSULE BY MOUTH EVERY 8 HOURS AS NEEDED COUGH Qty: 90 capsule, Refills: 0    calcium carbonate (TUMS - DOSED IN MG ELEMENTAL CALCIUM) 500 MG chewable tablet Chew 2 tablets by mouth daily with supper.     docusate sodium (COLACE) 100 MG capsule Take 100 mg by mouth 2 (two) times daily.     fluticasone (FLONASE) 50 MCG/ACT nasal spray Place 1 spray into both nostrils daily. Refills: 2    guaiFENesin (MUCINEX) 600 MG 12 hr tablet Take 1 tablet (600 mg total) by mouth 2 (two) times daily.    levETIRAcetam (KEPPRA) 500 MG tablet Take 1/2 tablet twice a day Qty: 90 tablet, Refills: 3   Associated Diagnoses: Convulsions, unspecified convulsion type (HCC)    Multiple Vitamin (MULTIVITAMIN WITH MINERALS) TABS tablet Take 1 tablet by mouth daily with breakfast.     polyethylene glycol (MIRALAX / GLYCOLAX) packet Take 17 g by mouth daily.    Vitamin D, Cholecalciferol, 1000 units TABS Take 2,000 tablets by mouth daily.       STOP taking these medications     azithromycin (ZITHROMAX) 250 MG tablet      doxycycline (VIBRA-TABS) 100 MG tablet         If you experience worsening of your admission symptoms, develop shortness of breath, life threatening emergency, suicidal or homicidal thoughts you must seek medical attention immediately by calling 911 or calling your MD immediately  if symptoms less severe.  You Must read complete instructions/literature along with all the possible adverse reactions/side effects for all the Medicines you take and that have been prescribed to you. Take any new Medicines after you have completely understood and accept all the possible adverse reactions/side effects.   Please note  You were cared for by a hospitalist during your hospital stay. If you have any questions about your discharge medications or the care you received while you were in the hospital after you are  discharged, you can call the unit and asked to speak with the hospitalist on call if the hospitalist that took care of you is not available. Once you are discharged, your primary care physician will handle any further medical issues. Please note that NO REFILLS for any discharge medications will be authorized once you are discharged, as it is imperative that you return to your primary care physician (or establish a relationship with a primary care physician if you do not have one) for your aftercare needs so that they can reassess your need for medications and monitor your lab values. Today   SUBJECTIVE  Doing well No new complaints dters in the room Pt at baseline per dters  VITAL SIGNS:  Blood pressure (!) 108/52, pulse 61, temperature 97.6 F (36.4 C), temperature source Oral, resp. rate 16, height 5\' 11"  (1.803 m), weight 78.3 kg (172 lb 9.6 oz), SpO2 95 %.  I/O:    Intake/Output  Summary (Last 24 hours) at 08/20/16 1533 Last data filed at 08/20/16 1200  Gross per 24 hour  Intake           661.67 ml  Output                0 ml  Net           661.67 ml    PHYSICAL EXAMINATION:  GENERAL:  83 y.o.-year-old patient lying in the bed with no acute distress.  EYES: Pupils equal, round, reactive to light and accommodation. No scleral icterus. Extraocular muscles intact.  HEENT: Head atraumatic, normocephalic. Oropharynx and nasopharynx clear.  NECK:  Supple, no jugular venous distention. No thyroid enlargement, no tenderness.  LUNGS: Normal breath sounds bilaterally, no wheezing, rales,rhonchi or crepitation. No use of accessory muscles of respiration.  CARDIOVASCULAR: S1, S2 normal. No murmurs, rubs, or gallops.  ABDOMEN: Soft, non-tender, non-distended. Bowel sounds present. No organomegaly or mass.  EXTREMITIES: bilateral chronic pedal edema,  nocyanosis, or clubbing.  NEUROLOGIC: Cranial nerves II through XII are intact. Muscle strength 5/5 in all extremities. Sensation intact. Gait  not checked.  PSYCHIATRIC: The patient is alert and oriented x 2.  SKIN: No obvious rash, lesion, or ulcer.   DATA REVIEW:   CBC   Recent Labs Lab 08/19/16 1317  WBC 6.8  HGB 12.8*  HCT 38.9*  PLT 192    Chemistries   Recent Labs Lab 08/19/16 1317  NA 133*  K 4.1  CL 97*  CO2 28  GLUCOSE 116*  BUN 14  CREATININE 0.63  CALCIUM 8.7*  AST 32  ALT 26  ALKPHOS 58  BILITOT 0.7    Microbiology Results   No results found for this or any previous visit (from the past 240 hour(s)).  RADIOLOGY:  Dg Chest 2 View  Result Date: 08/19/2016 CLINICAL DATA:  Weakness. EXAM: CHEST  2 VIEW COMPARISON:  05/30/2016. FINDINGS: Prior CABG. Cardiomegaly. Chronic interstitial changes noted bilaterally. No focal acute infiltrate. Calcified pulmonary nodules most likely granulomas. No pleural effusion or pneumothorax. Stomach is distended. IMPRESSION: 1. Prior CABG.  Stable cardiomegaly. 2. Chronic interstitial lung disease.  No acute abnormality. 3. Gastric distention. Electronically Signed   By: Maisie Fus  Register   On: 08/19/2016 15:42   Mr Brain Wo Contrast  Result Date: 08/19/2016 CLINICAL DATA:  Altered mental status, LEFT lower extremity weakness for 1 day. Chronic cough. Evaluate transient ischemic attack. History of popliteal aneurysm, hyperlipidemia, carotid artery disease, atrial fibrillation. EXAM: MRI HEAD WITHOUT CONTRAST MRA HEAD WITHOUT CONTRAST TECHNIQUE: Multiplanar, multiecho pulse sequences of the brain and surrounding structures were obtained without intravenous contrast. Angiographic images of the head were obtained using MRA technique without contrast. COMPARISON:  CT HEAD August 19, 2016 at 1334 hours and MRI of the head August 16, 2016 FINDINGS: MRI HEAD FINDINGS- moderately motion degraded examination. BRAIN: No reduced diffusion to suggest acute ischemia. Scattered similar micro hemorrhages. Similar linear susceptibility artifact RIGHT lateral ventricle. Severe  ventriculomegaly with disproportionate sulcal effacement at the convexities. Confluent supratentorial and pontine white matter FLAIR T2 hyperintensities. No midline shift, mass effect or masses. No abnormal extra-axial fluid collections. VASCULAR: Normal major intracranial vascular flow voids present at skull base. SKULL AND UPPER CERVICAL SPINE: No abnormal sellar expansion. No suspicious calvarial bone marrow signal. Craniocervical junction maintained. SINUSES/ORBITS: Trace bilateral mastoid effusions. Trace paranasal sinus mucosal thickening. The included ocular globes and orbital contents are non-suspicious. Status post RIGHT ocular lens implant. OTHER: None. MRA HEAD FINDINGS- severely motion  degraded examination. ANTERIOR CIRCULATION: Internal carotid artery's are patent. Proximal middle cerebral artery's are patent. Distal anterior cerebral artery's are patent. No large vessel occlusion, high-grade stenosis, abnormal luminal irregularity, aneurysm. POSTERIOR CIRCULATION: Patent vertebral artery's, basilar artery and proximal posterior cerebral artery's, mid and distal segment obscured by motion. ANATOMIC VARIANTS: Not able to be ascertain. Source images and MIP images were reviewed. IMPRESSION: MRI HEAD: Moderately motion degraded examination without acute intracranial process. Imaging findings of severe chronic normal pressure hydrocephalus. Moderate to severe chronic small vessel ischemic disease. Sequelae of old RIGHT intraventricular hemorrhage. MRA HEAD: Severely motion degraded examination. Patent skullbase vessels, limited assessment of cerebral artery's. Electronically Signed   By: Awilda Metroourtnay  Bloomer M.D.   On: 08/19/2016 21:46   [place] Carotid Bilateral (at [facility] And [facility] Only)  Result Date: 08/20/2016 CLINICAL DATA:  TIA symptoms, syncope, hyperlipidemia, tobacco use EXAM: BILATERAL CAROTID DUPLEX ULTRASOUND TECHNIQUE: Wallace CullensGray scale imaging, color Doppler and duplex ultrasound were performed of bilateral  carotid and vertebral arteries in the neck. COMPARISON:  08/19/2016 brain MRI FINDINGS: Criteria: Quantification of carotid stenosis is based on velocity parameters that correlate the residual internal carotid diameter with NASCET-based stenosis levels, using the diameter of the distal internal carotid lumen as the denominator for stenosis measurement. The following velocity measurements were obtained: RIGHT ICA:  118/20 cm/sec CCA:  90/13 cm/sec SYSTOLIC ICA/CCA RATIO:  1.3 DIASTOLIC ICA/CCA RATIO:  1.9 ECA:  68 cm/sec LEFT ICA:  95/20 cm/sec CCA:  85/11 cm/sec SYSTOLIC ICA/CCA RATIO:  1.1 DIASTOLIC ICA/CCA RATIO:  1.8 ECA:  81 cm/sec RIGHT CAROTID ARTERY: Minor echogenic shadowing plaque formation. No hemodynamically significant right ICA stenosis, velocity elevation, or turbulent flow. Degree of narrowing less than 50%. RIGHT VERTEBRAL ARTERY:  Antegrade LEFT CAROTID ARTERY: Similar scattered minor echogenic plaque formation. Left ICA is tortuous. No hemodynamically significant left ICA stenosis, velocity elevation, or turbulent flow. LEFT VERTEBRAL ARTERY:  Antegrade IMPRESSION: Minor carotid atherosclerosis and tortuosity. No hemodynamically significant ICA stenosis. Degree of narrowing less than 50% bilaterally. Patent antegrade vertebral flow bilaterally Electronically Signed   By: Judie PetitM.  Shick M.D.   On: 08/20/2016 09:59   Mr Maxine GlennMra Head/brain ZOWo Cm  Result Date: 08/19/2016 CLINICAL DATA:  Altered mental status, LEFT lower extremity weakness for 1 day. Chronic cough. Evaluate transient ischemic attack. History of popliteal aneurysm, hyperlipidemia, carotid artery disease, atrial fibrillation. EXAM: MRI HEAD WITHOUT CONTRAST MRA HEAD WITHOUT CONTRAST TECHNIQUE: Multiplanar, multiecho pulse sequences of the brain and surrounding structures were obtained without intravenous contrast. Angiographic images of the head were obtained using MRA technique without contrast. COMPARISON:  CT HEAD August 19, 2016 at 1334  hours and MRI of the head August 16, 2016 FINDINGS: MRI HEAD FINDINGS- moderately motion degraded examination. BRAIN: No reduced diffusion to suggest acute ischemia. Scattered similar micro hemorrhages. Similar linear susceptibility artifact RIGHT lateral ventricle. Severe ventriculomegaly with disproportionate sulcal effacement at the convexities. Confluent supratentorial and pontine white matter FLAIR T2 hyperintensities. No midline shift, mass effect or masses. No abnormal extra-axial fluid collections. VASCULAR: Normal major intracranial vascular flow voids present at skull base. SKULL AND UPPER CERVICAL SPINE: No abnormal sellar expansion. No suspicious calvarial bone marrow signal. Craniocervical junction maintained. SINUSES/ORBITS: Trace bilateral mastoid effusions. Trace paranasal sinus mucosal thickening. The included ocular globes and orbital contents are non-suspicious. Status post RIGHT ocular lens implant. OTHER: None. MRA HEAD FINDINGS- severely motion degraded examination. ANTERIOR CIRCULATION: Internal carotid artery's are patent. Proximal middle cerebral artery's are patent. Distal anterior cerebral artery's are patent. No  large vessel occlusion, high-grade stenosis, abnormal luminal irregularity, aneurysm. POSTERIOR CIRCULATION: Patent vertebral artery's, basilar artery and proximal posterior cerebral artery's, mid and distal segment obscured by motion. ANATOMIC VARIANTS: Not able to be ascertain. Source images and MIP images were reviewed. IMPRESSION: MRI HEAD: Moderately motion degraded examination without acute intracranial process. Imaging findings of severe chronic normal pressure hydrocephalus. Moderate to severe chronic small vessel ischemic disease. Sequelae of old RIGHT intraventricular hemorrhage. MRA HEAD: Severely motion degraded examination. Patent skullbase vessels, limited assessment of cerebral artery's. Electronically Signed   By: Awilda Metro M.D.   On: 08/19/2016 21:46   Ct  Head Code Stroke W/o Cm  Result Date: 08/19/2016 CLINICAL DATA:  Code stroke. Found in wheelchair slumped over. Minimal responsiveness. EXAM: CT HEAD WITHOUT CONTRAST TECHNIQUE: Contiguous axial images were obtained from the base of the skull through the vertex without intravenous contrast. COMPARISON:  05/30/2016. FINDINGS: Brain: No evidence for acute stroke, acute hemorrhage, mass lesion, or extra-axial fluid. Marked ventricular enlargement out of proportion to the degree of cerebral or cerebellar atrophy, concerning for normal pressure hydrocephalus. Vascular: No hyperdense vessel. Marked vascular calcification of the skull base internal carotid artery's and distal vertebral arteries. Skull: Normal. Negative for fracture or focal lesion. Sinuses/Orbits: No acute finding.  RIGHT cataract extraction. Other: None ASPECTS (Alberta Stroke Program Early CT Score) - Ganglionic level infarction (caudate, lentiform nuclei, internal capsule, insula, M1-M3 cortex): 7 - Supraganglionic infarction (M4-M6 cortex): 3 Total score (0-10 with 10 being normal): 10 IMPRESSION: 1. Marked ventricular enlargement out of proportion to the degree of cerebral volume loss, similar to the previous scan in March 2018. Concern raised for normal pressure hydrocephalus. No acute intracranial findings. 2. ASPECTS is 10 These results were called by telephone at the time of interpretation on 08/19/2016 at 1:41 pm to Dr. Rockne Menghini , who verbally acknowledged these results. Electronically Signed   By: Elsie Stain M.D.   On: 08/19/2016 13:44     Management plans discussed with the patient, family and they are in agreement.  CODE STATUS:     Code Status Orders        Start     Ordered   08/19/16 1808  Full code  Continuous     08/19/16 1807    Code Status History    Date Active Date Inactive Code Status Order ID Comments User Context   10/06/2015 12:31 AM 10/08/2015  7:28 PM Full Code [id number]  Oralia Manis, MD ED    08/17/2015  1:14 AM 08/17/2015  1:14 AM Full Code [id number]  Joella Prince, MD ED   08/17/2015  1:14 AM 08/22/2015  6:03 PM Full Code [id number]  Joella Prince, MD ED      TOTAL TIME TAKING CARE OF THIS PATIENT: 40 minutes.    Avyukt Cimo M.D on 08/20/2016 at 3:33 PM  Between 7am to 6pm - Pager - [phone] After 6pm go to [email or web address] - Social research officer, government  [facility]  Office  [phone]  CC: Primary care physician; Steele Sizer, MD

## 2016-08-20 NOTE — Progress Notes (Signed)
*  PRELIMINARY RESULTS* Echocardiogram 2D Echocardiogram has been performed.  Cristela BlueHege, Teodora Baumgarten 08/20/2016, 3:37 PM

## 2016-08-20 NOTE — Evaluation (Signed)
Clinical/Bedside Swallow Evaluation Patient Details  Name: [name] [name] MRN: [id number] Date of Birth: 06/05/1921  Today's Date: 08/20/2016 Time: SLP Start Time (ACUTE ONLY): 1400 SLP Stop Time (ACUTE ONLY): 1500 SLP Time Calculation (min) (ACUTE ONLY): 60 min  Past Medical History:  Past Medical History:  Diagnosis Date  . A-fib (HCC)   . Bradycardia    06/2010  . CAD (coronary artery disease)    nuclear 01/2008, no ischemia, EF 60%, distal anterior scar  . Carotid artery disease (HCC)    doppler 05/2009,  0-39% bilateral / doppler 06/2010 OK  . Edema    Ankle edema August, 2012  . Ejection fraction    EF 45%, echo,05/2009,apical hypo,  ?? posterior and lateral hypo  . Elevated CPK    11/23/08.Marland Kitchen.Marland Kitchen.probably from debris from popliteal aneurysm  . Fall at home July 2015  . Hx of CABG    1998  . Hyperlipidemia   . Intracranial hemorrhage (HCC)    Two small subependymal bleeds 06/2010,,ASA held  . PAD (peripheral artery disease) (HCC)    Above the knee to below the knee popliteal bypass,, right  //    Doppler,  July, 2013, patent  . Popliteal aneurysm (HCC)    .repair..11/25/2008  Dr. Darrick PennaFields  . Prominent abdominal aortic pulsation    doppler normal  . Speech problem    Neurologic event with brief infusion and speech difficulty.... January, 2011.... being assessed by neurology  February, 2011  . Syncope    .remote..no etiology  /  syncope 11/2008.. from acute leg pain  . TIA (transient ischemic attack)    Brief episode of confusion and speech difficulty, January, 2012  /  recurrent confusion speech difficulty September, 2012  . Venous insufficiency    support hose   Past Surgical History:  Past Surgical History:  Procedure Laterality Date  . CORONARY ARTERY BYPASS GRAFT  1998  . popliteal bypass  with reversed greater saphenous vein from right leg.     HPI:  Pt is a 83 y.o. male with a known history of multiple medical issues including Atrial fibrillation, coronary artery  disease, carotid artery disease, hyperlipidemia, peripheral arterial disease presented to the emergency room with decreased responsiveness and left lower extremity weakness. Patient lives alone at home is 24-hour care services. His daughter visits him on a regular basis. According to her patient has had decreased power in the left lower extremity for the last 1 day. He is mostly bedbound and uses wheelchair to ambulate at home. He was worked up with a CT head in the emergency room which showed no acute intracranial abnormality such as hemorrhage and infarct. Patients family complaints of cough that has been lingering around for last few months it is dry in nature. No history of any shortness of breath, orthopnea. Pt is on NECTAR consistency liquids at baseline at home per Daughter's report and tolerates this well. Currently, pt is on thin liquids at this admission.    Assessment / Plan / Recommendation Clinical Impression  Pt appears at min increased risk for aspiration secondary to increased risk for pharyngeal phase dysphagia. Pt is on Nectar consistency liquids via "sippy cup" at home for ~1 year as his baseline per Daughter's report. Pt tolerates this well. During this evaluation, pt consumed po trials of Nectar consistency liquids via pinched straw (demonstated to caregiver/Daughter) w/ no overt s/s of aspiration noted. Oral phase appeared wfl for bolus management and clearing. No other trials given at this time as pt  had recently finished lunch; Daughter had no concern re: foods stating he does "fine" w/ foods at home. Recommend modifying diet to Nectar consistency liquids as is his baseline; general aspiration precautions and support at meals. ST serivces will be available for any further education while admitted; Daughter agreed.  SLP Visit Diagnosis: Dysphagia, pharyngeal phase (R13.13)    Aspiration Risk  Mild aspiration risk (but reduced w/ modified diet and precautions)    Diet Recommendation   Dysphagia level 3 w/ Nectar liquids (baseline for pt); general aspiration precautions; support at meals  Medication Administration: Whole meds with puree (or crushed in puree as needed)    Other  Recommendations Recommended Consults:  (Dietician f/u) Oral Care Recommendations: Oral care BID;Staff/trained caregiver to provide oral care Other Recommendations: Order thickener from pharmacy;Remove water pitcher;Prohibited food (jello, ice cream, thin soups);Have oral suction available   Follow up Recommendations None      Frequency and Duration            Prognosis Prognosis for Safe Diet Advancement: Fair (-Good)      Swallow Study   General Date of Onset: 08/19/16 HPI: Pt is a 83 y.o. male with a known history of multiple medical issues including Atrial fibrillation, coronary artery disease, carotid artery disease, hyperlipidemia, peripheral arterial disease presented to the emergency room with decreased responsiveness and left lower extremity weakness. Patient lives alone at home is 24-hour care services. His daughter visits him on a regular basis. According to her patient has had decreased power in the left lower extremity for the last 1 day. He is mostly bedbound and uses wheelchair to ambulate at home. He was worked up with a CT head in the emergency room which showed no acute intracranial abnormality such as hemorrhage and infarct. Patients family complaints of cough that has been lingering around for last few months it is dry in nature. No history of any shortness of breath, orthopnea. Pt is on NECTAR consistency liquids at baseline at home per Daughter's report and tolerates this well. Currently, pt is on thin liquids at this admission.  Type of Study: Bedside Swallow Evaluation Previous Swallow Assessment: pt has been on thickened liquids for ~1 year per Daughter Diet Prior to this Study: Dysphagia 3 (soft);Regular;Nectar-thick liquids (per Daughter) Temperature Spikes Noted: No (wbc  6.8) Respiratory Status: Nasal cannula History of Recent Intubation: No Behavior/Cognition: Alert;Cooperative;Pleasant mood;Requires cueing (unsure of pt's baseline Cognitive status) Oral Cavity Assessment: Within Functional Limits Oral Care Completed by SLP: Recent completion by staff Oral Cavity - Dentition: Adequate natural dentition Vision: Functional for self-feeding (but assisted) Self-Feeding Abilities: Needs assist;Needs set up;Total assist Patient Positioning: Upright in bed Baseline Vocal Quality: Normal Volitional Cough:  (Fair) Volitional Swallow: Able to elicit    Oral/Motor/Sensory Function Overall Oral Motor/Sensory Function: Within functional limits (and w/ bolus management)   Ice Chips Ice chips: Not tested   Thin Liquid Thin Liquid: Not tested Other Comments: pt is on Nectar consistency liquids at home as his baseline     Nectar Thick Nectar Thick Liquid: Within functional limits Presentation: Straw (assisted; ~2-3 ozs ) Other Comments: demonstrated pinched straw   Honey Thick Honey Thick Liquid: Not tested   Puree Puree: Not tested   Solid   GO   Solid: Not tested    Functional Assessment Tool Used: clinical judgement Functional Limitations: Swallowing Swallow Current Status (J1914): At least 1 percent but less than 20 percent impaired, limited or restricted Swallow Goal Status [phone]): At least 1 percent but  less than 20 percent impaired, limited or restricted Swallow Discharge Status [phone]): At least 1 percent but less than 20 percent impaired, limited or restricted     Jerilynn Som, MS, CCC-SLP Watson,Katherine 08/20/2016,3:47 PM

## 2016-08-20 NOTE — Discharge Instructions (Signed)
Dysphagia III with nectar thick liquids

## 2016-08-22 LAB — ECHOCARDIOGRAM COMPLETE
HEIGHTINCHES: 71 in
WEIGHTICAEL: 2761.6 [oz_av]

## 2016-08-23 ENCOUNTER — Telehealth: Payer: Self-pay | Admitting: Family Medicine

## 2016-08-23 ENCOUNTER — Other Ambulatory Visit: Payer: Self-pay | Admitting: Family Medicine

## 2016-08-23 MED ORDER — ALBUTEROL SULFATE (2.5 MG/3ML) 0.083% IN NEBU: 2.5000 mg | INHALATION_SOLUTION | Freq: Four times a day (QID) | RESPIRATORY_TRACT | 6 refills | Status: DC | PRN

## 2016-08-23 NOTE — Telephone Encounter (Signed)
Message left to notify pt that RX had been sent over.

## 2016-08-23 NOTE — Telephone Encounter (Signed)
Pt needs refill on albuterol (PROVENTIL) (2.5 MG/3ML) 0.083% nebulizer solution [facility].

## 2016-08-28 ENCOUNTER — Inpatient Hospital Stay: Payer: Medicare Other | Admitting: Family Medicine

## 2016-09-02 ENCOUNTER — Observation Stay
Admission: EM | Admit: 2016-09-02 | Discharge: 2016-09-04 | Disposition: A | Discharge status: Home / Self Care | Payer: Medicare Other | Attending: Internal Medicine | Admitting: Internal Medicine

## 2016-09-02 ENCOUNTER — Emergency Department: Payer: Medicare Other

## 2016-09-02 ENCOUNTER — Emergency Department
Admission: EM | Admit: 2016-09-02 | Discharge: 2016-09-02 | Disposition: A | Discharge status: Home / Self Care | Payer: Medicare Other | Source: Home / Self Care | Attending: Emergency Medicine | Admitting: Emergency Medicine

## 2016-09-02 ENCOUNTER — Encounter: Payer: Self-pay | Admitting: Emergency Medicine

## 2016-09-02 DIAGNOSIS — I739 Peripheral vascular disease, unspecified: Secondary | ICD-10-CM | POA: Diagnosis not present

## 2016-09-02 DIAGNOSIS — J42 Unspecified chronic bronchitis: Secondary | ICD-10-CM | POA: Insufficient documentation

## 2016-09-02 DIAGNOSIS — Z79899 Other long term (current) drug therapy: Secondary | ICD-10-CM

## 2016-09-02 DIAGNOSIS — Z823 Family history of stroke: Secondary | ICD-10-CM | POA: Diagnosis not present

## 2016-09-02 DIAGNOSIS — Z882 Allergy status to sulfonamides status: Secondary | ICD-10-CM | POA: Diagnosis not present

## 2016-09-02 DIAGNOSIS — R05 Cough: Secondary | ICD-10-CM | POA: Insufficient documentation

## 2016-09-02 DIAGNOSIS — Z8673 Personal history of transient ischemic attack (TIA), and cerebral infarction without residual deficits: Secondary | ICD-10-CM | POA: Insufficient documentation

## 2016-09-02 DIAGNOSIS — I2510 Atherosclerotic heart disease of native coronary artery without angina pectoris: Secondary | ICD-10-CM | POA: Insufficient documentation

## 2016-09-02 DIAGNOSIS — R569 Unspecified convulsions: Secondary | ICD-10-CM | POA: Diagnosis not present

## 2016-09-02 DIAGNOSIS — Z833 Family history of diabetes mellitus: Secondary | ICD-10-CM | POA: Insufficient documentation

## 2016-09-02 DIAGNOSIS — Z880 Allergy status to penicillin: Secondary | ICD-10-CM | POA: Diagnosis not present

## 2016-09-02 DIAGNOSIS — Z91040 Latex allergy status: Secondary | ICD-10-CM | POA: Insufficient documentation

## 2016-09-02 DIAGNOSIS — E784 Other hyperlipidemia: Secondary | ICD-10-CM | POA: Insufficient documentation

## 2016-09-02 DIAGNOSIS — I82409 Acute embolism and thrombosis of unspecified deep veins of unspecified lower extremity: Secondary | ICD-10-CM | POA: Diagnosis not present

## 2016-09-02 DIAGNOSIS — Z87891 Personal history of nicotine dependence: Secondary | ICD-10-CM | POA: Diagnosis not present

## 2016-09-02 DIAGNOSIS — R55 Syncope and collapse: Secondary | ICD-10-CM

## 2016-09-02 DIAGNOSIS — I872 Venous insufficiency (chronic) (peripheral): Secondary | ICD-10-CM | POA: Insufficient documentation

## 2016-09-02 DIAGNOSIS — Z7901 Long term (current) use of anticoagulants: Secondary | ICD-10-CM | POA: Insufficient documentation

## 2016-09-02 DIAGNOSIS — Z85828 Personal history of other malignant neoplasm of skin: Secondary | ICD-10-CM | POA: Insufficient documentation

## 2016-09-02 DIAGNOSIS — I4891 Unspecified atrial fibrillation: Secondary | ICD-10-CM | POA: Insufficient documentation

## 2016-09-02 DIAGNOSIS — I5022 Chronic systolic (congestive) heart failure: Secondary | ICD-10-CM

## 2016-09-02 DIAGNOSIS — Z7902 Long term (current) use of antithrombotics/antiplatelets: Secondary | ICD-10-CM | POA: Diagnosis not present

## 2016-09-02 DIAGNOSIS — Z951 Presence of aortocoronary bypass graft: Secondary | ICD-10-CM | POA: Insufficient documentation

## 2016-09-02 DIAGNOSIS — Z7951 Long term (current) use of inhaled steroids: Secondary | ICD-10-CM | POA: Diagnosis not present

## 2016-09-02 LAB — COMPREHENSIVE METABOLIC PANEL
ALK PHOS: 59 U/L (ref 38–126)
ALT: 25 U/L (ref 17–63)
AST: 37 U/L (ref 15–41)
Albumin: 2.9 g/dL — ABNORMAL LOW (ref 3.5–5.0)
Anion gap: 12 (ref 5–15)
BUN: 13 mg/dL (ref 6–20)
CALCIUM: 9.1 mg/dL (ref 8.9–10.3)
CHLORIDE: 96 mmol/L — AB (ref 101–111)
CO2: 27 mmol/L (ref 22–32)
CREATININE: 0.63 mg/dL (ref 0.61–1.24)
GFR calc Af Amer: >60 mL/min (ref >60)
GFR calc non Af Amer: >60 mL/min (ref >60)
GLUCOSE: 101 mg/dL — AB (ref 65–99)
Potassium: 4.5 mmol/L (ref 3.5–5.1)
SODIUM: 135 mmol/L (ref 135–145)
Total Bilirubin: 1.2 mg/dL (ref 0.3–1.2)
Total Protein: 6.6 g/dL (ref 6.5–8.1)

## 2016-09-02 LAB — URINALYSIS, COMPLETE (UACMP) WITH MICROSCOPIC
BACTERIA UA: NONE SEEN
Bilirubin Urine: NEGATIVE
Glucose, UA: 50 mg/dL — AB
Hgb urine dipstick: NEGATIVE
KETONES UR: 20 mg/dL — AB
Leukocytes, UA: NEGATIVE
Nitrite: NEGATIVE
PH: 5 (ref 5.0–8.0)
PROTEIN: 30 mg/dL — AB
Specific Gravity, Urine: 1.019 (ref 1.005–1.030)
Squamous Epithelial / LPF: NONE SEEN

## 2016-09-02 LAB — CBC WITH DIFFERENTIAL/PLATELET
BASOS PCT: 1 %
Basophils Absolute: 0.1 10*3/uL (ref 0–0.1)
EOS ABS: 0.2 10*3/uL (ref 0–0.7)
EOS PCT: 2 %
HCT: 38.7 % — ABNORMAL LOW (ref 40.0–52.0)
HEMOGLOBIN: 12.9 g/dL — AB (ref 13.0–18.0)
LYMPHS ABS: 2.9 10*3/uL (ref 1.0–3.6)
Lymphocytes Relative: 28 %
MCH: 28.7 pg (ref 26.0–34.0)
MCHC: 33.3 g/dL (ref 32.0–36.0)
MCV: 86.2 fL (ref 80.0–100.0)
MONO ABS: 0.4 10*3/uL (ref 0.2–1.0)
MONOS PCT: 4 %
NEUTROS PCT: 65 %
Neutro Abs: 6.8 10*3/uL — ABNORMAL HIGH (ref 1.4–6.5)
Platelets: 239 10*3/uL (ref 150–440)
RBC: 4.5 MIL/uL (ref 4.40–5.90)
RDW: 15.9 % — AB (ref 11.5–14.5)
WBC: 10.4 10*3/uL (ref 3.8–10.6)

## 2016-09-02 LAB — TROPONIN I
Troponin I: <0.03 ng/mL (ref <0.03)
Troponin I: <0.03 ng/mL (ref <0.03)
Troponin I: <0.03 ng/mL (ref <0.03)

## 2016-09-02 LAB — TSH: TSH: 2.537 u[IU]/mL (ref 0.350–4.500)

## 2016-09-02 MED ORDER — GUAIFENESIN ER 600 MG PO TB12
600.0000 mg | ORAL_TABLET | Freq: Two times a day (BID) | ORAL | Status: DC
  Administered 2016-09-02 – 2016-09-04 (×4): 600 mg via ORAL
  Filled 2016-09-02 (×4): qty 1

## 2016-09-02 MED ORDER — FLUTICASONE PROPIONATE 50 MCG/ACT NA SUSP
1.0000 | Freq: Every day | NASAL | Status: DC
  Administered 2016-09-03 – 2016-09-04 (×2): 1 via NASAL
  Filled 2016-09-02: qty 16

## 2016-09-02 MED ORDER — ONDANSETRON HCL 4 MG PO TABS: 4.0000 mg | ORAL_TABLET | Freq: Four times a day (QID) | ORAL | Status: DC | PRN

## 2016-09-02 MED ORDER — LEVETIRACETAM 500 MG PO TABS
500.0000 mg | ORAL_TABLET | Freq: Every day | ORAL | Status: DC
  Administered 2016-09-02: 500 mg via ORAL
  Filled 2016-09-02: qty 1

## 2016-09-02 MED ORDER — CALCIUM CARBONATE ANTACID 500 MG PO CHEW: 2.0000 | CHEWABLE_TABLET | Freq: Every day | ORAL | Status: DC

## 2016-09-02 MED ORDER — ACETAMINOPHEN 650 MG RE SUPP: 650.0000 mg | Freq: Four times a day (QID) | RECTAL | Status: DC | PRN

## 2016-09-02 MED ORDER — BENZONATATE 100 MG PO CAPS
100.0000 mg | ORAL_CAPSULE | Freq: Three times a day (TID) | ORAL | Status: DC
  Filled 2016-09-02 (×2): qty 1

## 2016-09-02 MED ORDER — DEXAMETHASONE SODIUM PHOSPHATE 10 MG/ML IJ SOLN
10.0000 mg | Freq: Once | INTRAMUSCULAR | Status: AC
  Administered 2016-09-02: 10 mg via INTRAVENOUS

## 2016-09-02 MED ORDER — VITAMIN D 1000 UNITS PO TABS
2000.0000 [IU] | ORAL_TABLET | Freq: Every day | ORAL | Status: DC
  Administered 2016-09-03 – 2016-09-04 (×2): 2000 [IU] via ORAL
  Filled 2016-09-02 (×2): qty 2

## 2016-09-02 MED ORDER — LEVETIRACETAM 250 MG PO TABS
250.0000 mg | ORAL_TABLET | Freq: Every day | ORAL | Status: DC
  Administered 2016-09-03 – 2016-09-04 (×2): 250 mg via ORAL
  Filled 2016-09-02 (×2): qty 1

## 2016-09-02 MED ORDER — APIXABAN 5 MG PO TABS
5.0000 mg | ORAL_TABLET | Freq: Two times a day (BID) | ORAL | Status: DC
  Administered 2016-09-02 – 2016-09-04 (×4): 5 mg via ORAL
  Filled 2016-09-02 (×4): qty 1

## 2016-09-02 MED ORDER — POLYETHYLENE GLYCOL 3350 17 G PO PACK
17.0000 g | PACK | Freq: Every day | ORAL | Status: DC
  Filled 2016-09-02 (×2): qty 1

## 2016-09-02 MED ORDER — DOCUSATE SODIUM 100 MG PO CAPS
100.0000 mg | ORAL_CAPSULE | Freq: Two times a day (BID) | ORAL | Status: DC
  Filled 2016-09-02 (×2): qty 1

## 2016-09-02 MED ORDER — ONDANSETRON HCL 4 MG/2ML IJ SOLN: 4.0000 mg | Freq: Four times a day (QID) | INTRAMUSCULAR | Status: DC | PRN

## 2016-09-02 MED ORDER — LEVETIRACETAM 250 MG PO TABS: 250.0000 mg | ORAL_TABLET | Freq: Two times a day (BID) | ORAL | Status: DC

## 2016-09-02 MED ORDER — IOPAMIDOL (ISOVUE-300) INJECTION 61%
75.0000 mL | Freq: Once | INTRAVENOUS | Status: AC | PRN
  Administered 2016-09-02: 75 mL via INTRAVENOUS

## 2016-09-02 MED ORDER — ALBUTEROL SULFATE (2.5 MG/3ML) 0.083% IN NEBU: 2.5000 mg | INHALATION_SOLUTION | Freq: Four times a day (QID) | RESPIRATORY_TRACT | Status: DC | PRN

## 2016-09-02 MED ORDER — DEXAMETHASONE SODIUM PHOSPHATE 10 MG/ML IJ SOLN
INTRAMUSCULAR | Status: AC
  Administered 2016-09-02: 10 mg via INTRAVENOUS
  Filled 2016-09-02: qty 1

## 2016-09-02 MED ORDER — ADULT MULTIVITAMIN W/MINERALS CH
1.0000 | ORAL_TABLET | Freq: Every day | ORAL | Status: DC
  Administered 2016-09-03 – 2016-09-04 (×2): 1 via ORAL
  Filled 2016-09-02 (×2): qty 1

## 2016-09-02 MED ORDER — AMIODARONE HCL 200 MG PO TABS
200.0000 mg | ORAL_TABLET | Freq: Every day | ORAL | Status: DC
  Administered 2016-09-03: 200 mg via ORAL
  Filled 2016-09-02: qty 1

## 2016-09-02 MED ORDER — ACETAMINOPHEN 325 MG PO TABS: 650.0000 mg | ORAL_TABLET | Freq: Four times a day (QID) | ORAL | Status: DC | PRN

## 2016-09-02 NOTE — ED Notes (Signed)
Pt repositioned on LFT side at this time

## 2016-09-02 NOTE — ED Provider Notes (Signed)
[facility] Emergency Department Provider Note  ____________________________________________   None    (approximate)  I have reviewed the triage vital signs and the nursing notes.   HISTORY  Chief Complaint Loss of Consciousness  Level V exemption history Limited by the patient's clinical condition  HPI [name] [name] is a 83 y.o. male who comes to the emergency department via EMS after reported episode of syncope this morning. According to EMS the patient was at his baseline when they got there about an route he began to cough violently when he did his heart rate dropped from the 70s to the high 30s and his blood pressure dropped. After the coughing fit his heart rate and blood pressure normalized. According to the daughter at bedside who is not present when the syncopal event happened the patient may have had a brief episode of difficulty breathing and was slightly confused. The daughter says the patient had never had a history of COPD but over the past month or 2 he has had increasingly productive cough and has completed numerous courses of outpatient antibiotics. 4 days ago he began coughing more and so she began giving him cefdinir that they had left over at home. She did not seek medical care at that time.   Past Medical History:  Diagnosis Date  . A-fib (HCC)   . Bradycardia    06/2010  . CAD (coronary artery disease)    nuclear 01/2008, no ischemia, EF 60%, distal anterior scar  . Carotid artery disease (HCC)    doppler 05/2009,  0-39% bilateral / doppler 06/2010 OK  . Edema    Ankle edema August, 2012  . Ejection fraction    EF 45%, echo,05/2009,apical hypo,  ?? posterior and lateral hypo  . Elevated CPK    11/23/08.Marland KitchenMarland Kitchenprobably from debris from popliteal aneurysm  . Fall at home July 2015  . Hx of CABG    1998  . Hyperlipidemia   . Intracranial hemorrhage (HCC)    Two small subependymal bleeds 06/2010,,ASA held  . PAD (peripheral artery disease)  (HCC)    Above the knee to below the knee popliteal bypass,, right  //    Doppler,  July, 2013, patent  . Popliteal aneurysm (HCC)    .repair..11/25/2008  Dr. Darrick Penna  . Prominent abdominal aortic pulsation    doppler normal  . Speech problem    Neurologic event with brief infusion and speech difficulty.... January, 2011.... being assessed by neurology  February, 2011  . Syncope    .remote..no etiology  /  syncope 11/2008.. from acute leg pain  . TIA (transient ischemic attack)    Brief episode of confusion and speech difficulty, January, 2012  /  recurrent confusion speech difficulty September, 2012  . Venous insufficiency    support hose    Patient Active Problem List   Diagnosis Date Noted  . Pressure injury of skin 08/20/2016  . Anemia 03/19/2016  . Seizure (HCC) 10/05/2015  . A-fib (HCC) 10/05/2015  . Chronic systolic (congestive) heart failure (HCC) 09/07/2015  . Altered mental status 08/18/2015  . CAD in native artery 08/07/2015  . Arterial disease (HCC) 08/07/2015  . HLD (hyperlipidemia) 08/07/2015  . Temporary cerebral vascular dysfunction 08/07/2015  . Chronic venous insufficiency 08/07/2015  . H/O neoplasm 08/22/2014  . H/O malignant neoplasm of skin 08/22/2014  . 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 Peripheral vascular disease (HCC) 02/28/2014  . Postsurgical aortocoronary bypass status 02/28/2014  . Syncope and collapse 02/28/2014  .  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 PAD (peripheral artery disease) (HCC)   . TIA (transient ischemic attack)   . Edema   . Hyperlipidemia   . Syncope   . CAD (coronary artery disease)   . Venous insufficiency   . Carotid artery disease (HCC)   . Elevated CPK   . Popliteal aneurysm (HCC)   . Hx of CABG   . Intracranial hemorrhage [facility](HCC)     Past Surgical History:  Procedure Laterality Date  . CORONARY ARTERY BYPASS GRAFT  1998  .  popliteal bypass  with reversed greater saphenous vein from right leg.      Prior to Admission medications   Medication Sig Start Date End Date Taking? Authorizing Provider  acetaminophen (TYLENOL) 325 MG tablet Take 2 tablets (650 mg total) by mouth every 6 (six) hours as needed for mild pain (or Fever >/= 101). 10/08/15   Gouru, Aruna, MD  albuterol (PROVENTIL) (2.5 MG/3ML) 0.083% nebulizer solution Take 3 mLs (2.5 mg total) by nebulization every 6 (six) hours as needed for wheezing or shortness of breath. 08/23/16   Johnson, Megan P, DO  amiodarone (PACERONE) 200 MG tablet TAKE 1 TABLET BY MOUTH ONCE A DAY Patient taking differently: TAKE 1 TABLET BY MOUTH ONCE A DAY IN THE MORNING. 10/31/15   Particia NearingLane, Rachel Elizabeth, PA-C  apixaban (ELIQUIS) 5 MG TABS tablet Take 1 tablet (5 mg total) by mouth 2 (two) times daily. 12/20/15   Steele Sizerrissman, Mark A, MD  benzonatate (TESSALON) 100 MG capsule TAKE ONE CAPSULE BY MOUTH EVERY 8 HOURS AS NEEDED COUGH 06/27/16   Steele Sizerrissman, Mark A, MD  calcium carbonate (TUMS - DOSED IN MG ELEMENTAL CALCIUM) 500 MG chewable tablet Chew 2 tablets by mouth daily with supper.     [provider]  docusate sodium (COLACE) 100 MG capsule Take 100 mg by mouth 2 (two) times daily.     [provider]  fluticasone (FLONASE) 50 MCG/ACT nasal spray Place 1 spray into both nostrils daily. 08/22/15   Auburn BilberryPatel, Shreyang, MD  guaiFENesin (MUCINEX) 600 MG 12 hr tablet Take 1 tablet (600 mg total) by mouth 2 (two) times daily. 08/22/15   Auburn BilberryPatel, Shreyang, MD  levETIRAcetam (KEPPRA) 500 MG tablet Take 1/2 tablet twice a day Patient taking differently: Take 250-500 mg by mouth 2 (two) times daily. Take 250 mg in the morning and 500 mg at night. 01/15/16   Van ClinesAquino, Karen M, MD  Multiple Vitamin (MULTIVITAMIN WITH MINERALS) TABS tablet Take 1 tablet by mouth daily with breakfast.     [provider]  polyethylene glycol (MIRALAX / GLYCOLAX) packet Take 17 g by mouth daily.     [provider]  Vitamin D, Cholecalciferol, 1000 units TABS Take 2,000 tablets by mouth daily.     [provider]    Allergies Penicillins; Latex; and Sulfonamide derivatives  Family History  Problem Relation Age of Onset  . Stroke Mother   . Diabetes Mother   . Diabetes Sister     Social History Social History  Substance Use Topics  . Smoking status: Former Smoker    Years: 20.00    Types: Cigarettes    Quit date: 09/12/1946  . Smokeless tobacco: Never Used     Comment: quit appox 50 yrs ago  . Alcohol use No    Review of Systems Level V exemption history Limited by the patient's clinical condition  ____________________________________________   PHYSICAL  EXAM:  VITAL SIGNS: ED Triage Vitals  Enc Vitals Group     BP      Pulse      Resp      Temp      Temp src      SpO2      Weight      Height      Head Circumference      Peak Flow      Pain Score      Pain Loc      Pain Edu?      Excl. in GC?     Constitutional: No acute respiratory distress although with a white sounding cough somewhat confused Eyes: PERRL EOMI. Head: Atraumatic. Nose: No congestion/rhinnorhea. Mouth/Throat: No trismus no bites to the tongue no lacerations within the mouth Neck: No stridor.   Cardiovascular: Normal rate, regular rhythm. Grossly normal heart sounds.  Good peripheral circulation. No murmur appreciated Respiratory: Normal respiratory effort.  No retractions. Lungs CTAB and moving good air Gastrointestinal: Soft nontender Musculoskeletal: No lower extremity edema   Neurologic:   No gross focal neurologic deficits are appreciated. Skin:  Skin is warm, dry and intact. No rash noted. .    ____________________________________________   DIFFERENTIAL includes but not limited to  Cardiogenic syncope, vasovagal syncope, seizure, metabolic arrangement, dehydration ____________________________________________   LABS (all labs ordered are listed, but  only abnormal results are displayed)  Labs Reviewed  COMPREHENSIVE METABOLIC PANEL - Abnormal; Notable for the following:       Result Value   Chloride 96 (*)    Glucose, Bld 101 (*)    Albumin 2.9 (*)    All other components within normal limits  CBC WITH DIFFERENTIAL/PLATELET - Abnormal; Notable for the following:    Hemoglobin 12.9 (*)    HCT 38.7 (*)    RDW 15.9 (*)    Neutro Abs 6.8 (*)    All other components within normal limits  URINALYSIS, COMPLETE (UACMP) WITH MICROSCOPIC - Abnormal; Notable for the following:    Color, Urine AMBER (*)    APPearance CLOUDY (*)    Glucose, UA 50 (*)    Ketones, ur 20 (*)    Protein, ur 30 (*)    All other components within normal limits  TROPONIN I    No metabolic arrangement no signs of acute ischemia __________________________________________  EKG  ED ECG REPORT I, Merrily Brittle, the attending physician, personally viewed and interpreted this ECG.  Date: 09/02/2016 Rate: 68 Rhythm: normal sinus rhythm QRS Axis: Left axis Intervals: normal ST/T Wave abnormalities: normal Narrative Interpretation: Poor R-wave progression but no signs of acute disease  ____________________________________________  RADIOLOGY  Chest x-ray concerning for right upper lobe pneumonia CT scan confirms chronic changes ____________________________________________   PROCEDURES  Procedure(s) performed: no  Procedures  Critical Care performed: no  Observation: no ____________________________________________   INITIAL IMPRESSION / ASSESSMENT AND PLAN / ED COURSE  Pertinent labs & imaging results that were available during my care of the patient were reviewed by me and considered in my medical decision making (see chart for details).  The patient arrives hemodynamically stable with no acute disease noted. Nonfocal neurological exam and no bites within his mouth. His history is not consistent with seizure and the episode with EMS is clearly  related to cough vasovagal. This may have happened earlier with the cough. His chest x-ray is abnormal but given the multiple previous courses of antibiotics with persistent cough I will obtain a CT scan  looking for abscess, retained foreign body etc.  Fortunately the patient's CT scan is negative and only shows chronic changes. I see may have COPD and give him a dose of dexamethasone now. He is been observed multiple hours on monitor here with no arrhythmia. His EKG has no concerning signs for ventricular tachycardia. At this point is medically stable for outpatient management.      ____________________________________________   FINAL CLINICAL IMPRESSION(S) / ED DIAGNOSES  Final diagnoses:  Syncope and collapse      NEW MEDICATIONS STARTED DURING THIS VISIT:  Discharge Medication List as of 09/02/2016  1:02 PM       Note:  This document was prepared using Dragon voice recognition software and may include unintentional dictation errors.     Merrily Brittle, MD 09/02/16 1549

## 2016-09-02 NOTE — ED Notes (Signed)
Per caregiver on abx for "cough"

## 2016-09-02 NOTE — ED Notes (Signed)
ED Provider at bedside. 

## 2016-09-02 NOTE — ED Provider Notes (Signed)
[facility] Emergency Department Provider Note    ____________________________________________   I have reviewed the triage vital signs and the nursing notes.   HISTORY  Chief Complaint Syncope  History primarily obtained from family    HPI [name] [name] is a 83 y.o. male who presents to the emergency department today because of an episode of syncope. The patient was seen earlier today in the emergency department for syncope. It has now happened multiple times today. Workup that time without any clear etiology. During transport from the emergency department after that visit apparently the patient had another syncopal episode. Family describes it as some slumping over in his chair. He was staring straight ahead. No report of any seizure like activity. No recent illnesses.    Past Medical History:  Diagnosis Date  . A-fib (HCC)   . Bradycardia    06/2010  . CAD (coronary artery disease)    nuclear 01/2008, no ischemia, EF 60%, distal anterior scar  . Carotid artery disease (HCC)    doppler 05/2009,  0-39% bilateral / doppler 06/2010 OK  . Edema    Ankle edema August, 2012  . Ejection fraction    EF 45%, echo,05/2009,apical hypo,  ?? posterior and lateral hypo  . Elevated CPK    11/23/08.Marland KitchenMarland Kitchenprobably from debris from popliteal aneurysm  . Fall at home July 2015  . Hx of CABG    1998  . Hyperlipidemia   . Intracranial hemorrhage (HCC)    Two small subependymal bleeds 06/2010,,ASA held  . PAD (peripheral artery disease) (HCC)    Above the knee to below the knee popliteal bypass,, right  //    Doppler,  July, 2013, patent  . Popliteal aneurysm (HCC)    .repair..11/25/2008  Dr. Darrick Penna  . Prominent abdominal aortic pulsation    doppler normal  . Speech problem    Neurologic event with brief infusion and speech difficulty.... January, 2011.... being assessed by neurology  February, 2011  . Syncope    .remote..no etiology  /  syncope 11/2008.. from acute leg  pain  . TIA (transient ischemic attack)    Brief episode of confusion and speech difficulty, January, 2012  /  recurrent confusion speech difficulty September, 2012  . Venous insufficiency    support hose    Patient Active Problem List   Diagnosis Date Noted  . Pressure injury of skin 08/20/2016  . Anemia 03/19/2016  . Seizure (HCC) 10/05/2015  . A-fib (HCC) 10/05/2015  . Chronic systolic (congestive) heart failure (HCC) 09/07/2015  . Altered mental status 08/18/2015  . CAD in native artery 08/07/2015  . Arterial disease (HCC) 08/07/2015  . HLD (hyperlipidemia) 08/07/2015  . Temporary cerebral vascular dysfunction 08/07/2015  . Chronic venous insufficiency 08/07/2015  . H/O neoplasm 08/22/2014  . H/O malignant neoplasm of skin 08/22/2014  . 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 Peripheral vascular disease (HCC) 02/28/2014  . Postsurgical aortocoronary bypass status 02/28/2014  . Syncope and collapse 02/28/2014  . 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 PAD (peripheral artery disease) (HCC)   . TIA (transient ischemic attack)   . Edema   . Hyperlipidemia   . Syncope   . CAD (coronary artery disease)   . Venous insufficiency   . Carotid artery disease (HCC)   . Elevated CPK   . Popliteal aneurysm (HCC)   . Hx of CABG   . Intracranial hemorrhage (HCC)  Past Surgical History:  Procedure Laterality Date  . CORONARY ARTERY BYPASS GRAFT  1998  . popliteal bypass  with reversed greater saphenous vein from right leg.      Prior to Admission medications   Medication Sig Start Date End Date Taking? Authorizing Provider  acetaminophen (TYLENOL) 325 MG tablet Take 2 tablets (650 mg total) by mouth every 6 (six) hours as needed for mild pain (or Fever >/= 101). 10/08/15   Gouru, Aruna, MD  albuterol (PROVENTIL) (2.5 MG/3ML) 0.083% nebulizer solution Take 3 mLs (2.5 mg total) by nebulization  every 6 (six) hours as needed for wheezing or shortness of breath. 08/23/16   Johnson, Megan P, DO  amiodarone (PACERONE) 200 MG tablet TAKE 1 TABLET BY MOUTH ONCE A DAY Patient taking differently: TAKE 1 TABLET BY MOUTH ONCE A DAY IN THE MORNING. 10/31/15   Particia Nearing, PA-C  apixaban (ELIQUIS) 5 MG TABS tablet Take 1 tablet (5 mg total) by mouth 2 (two) times daily. 12/20/15   Steele Sizer, MD  benzonatate (TESSALON) 100 MG capsule TAKE ONE CAPSULE BY MOUTH EVERY 8 HOURS AS NEEDED COUGH 06/27/16   Steele Sizer, MD  calcium carbonate (TUMS - DOSED IN MG ELEMENTAL CALCIUM) 500 MG chewable tablet Chew 2 tablets by mouth daily with supper.     [provider]  docusate sodium (COLACE) 100 MG capsule Take 100 mg by mouth 2 (two) times daily.     [provider]  fluticasone (FLONASE) 50 MCG/ACT nasal spray Place 1 spray into both nostrils daily. 08/22/15   Auburn Bilberry, MD  guaiFENesin (MUCINEX) 600 MG 12 hr tablet Take 1 tablet (600 mg total) by mouth 2 (two) times daily. 08/22/15   Auburn Bilberry, MD  levETIRAcetam (KEPPRA) 500 MG tablet Take 1/2 tablet twice a day Patient taking differently: Take 250-500 mg by mouth 2 (two) times daily. Take 250 mg in the morning and 500 mg at night. 01/15/16   Van Clines, MD  Multiple Vitamin (MULTIVITAMIN WITH MINERALS) TABS tablet Take 1 tablet by mouth daily with breakfast.     [provider]  polyethylene glycol (MIRALAX / GLYCOLAX) packet Take 17 g by mouth daily.    [provider]  Vitamin D, Cholecalciferol, 1000 units TABS Take 2,000 tablets by mouth daily.     [provider]    Allergies Penicillins; Latex; and Sulfonamide derivatives  Family History  Problem Relation Age of Onset  . Stroke Mother   . Diabetes Mother   . Diabetes Sister     Social History Social History  Substance Use Topics  . Smoking status: Former Smoker    Years: 20.00    Types: Cigarettes    Quit  date: 09/12/1946  . Smokeless tobacco: Never Used     Comment: quit appox 50 yrs ago  . Alcohol use No    Review of Systems Constitutional: No fever/chills Eyes: No visual changes. ENT: No sore throat. Cardiovascular: Denies chest pain. Respiratory: Denies shortness of breath. Gastrointestinal: No abdominal pain.  No nausea, no vomiting.  No diarrhea.   Genitourinary: Negative for dysuria. Musculoskeletal: Negative for back pain. Skin: Negative for rash. Neurological: Negative for headaches, focal weakness or numbness.  ____________________________________________   PHYSICAL EXAM:  VITAL SIGNS: ED Triage Vitals [09/02/16 1538]  Enc Vitals Group     BP 112/75     Pulse Rate 66     Resp 16     Temp 98.7 F (37.1 C)  Temp Source Oral     SpO2 95 %     Weight      Height      Head Circumference      Peak Flow      Pain Score 0   Constitutional: Awake, alert. No acute distress. Eyes: Conjunctivae are normal.  ENT   Head: Normocephalic and atraumatic.   Nose: No congestion/rhinnorhea.   Mouth/Throat: Mucous membranes are moist.   Neck: No stridor. Hematological/Lymphatic/Immunilogical: No cervical lymphadenopathy. Cardiovascular: Normal rate, regular rhythm.  No murmurs, rubs, or gallops.  Respiratory: Normal respiratory effort without tachypnea nor retractions. Breath sounds are clear and equal bilaterally. No wheezes/rales/rhonchi. Gastrointestinal: Soft and non tender. No rebound. No guarding.  Genitourinary: Deferred Musculoskeletal: Normal range of motion in all extremities. No lower extremity edema. Neurologic:  Normal speech and language. No gross focal neurologic deficits are appreciated.  Skin:  Skin is warm, dry and intact. No rash noted. Psychiatric: Mood and affect are normal. Speech and behavior are normal. Patient exhibits appropriate insight and judgment.  ____________________________________________    LABS (pertinent  positives/negatives)  Labs Reviewed  TROPONIN I     ____________________________________________   EKG  None  ____________________________________________    RADIOLOGY  CT head IMPRESSION:  1. No acute intracranial findings. No change from prior.  2. Stable atrophy and ventriculomegaly.      ____________________________________________   PROCEDURES  Procedures  ____________________________________________   INITIAL IMPRESSION / ASSESSMENT AND PLAN / ED COURSE  Pertinent labs & imaging results that were available during my care of the patient were reviewed by me and considered in my medical decision making (see chart for details).  Patient represents to the emergency department after another syncopal episode. CT head second troponin negative. Will plan on admission.  ____________________________________________   FINAL CLINICAL IMPRESSION(S) / ED DIAGNOSES  Final diagnoses:  Syncope, unspecified syncope type     Note: This dictation was prepared with Dragon dictation. Any transcriptional errors that result from this process are unintentional     Phineas SemenGoodman, Ladarrion Telfair, MD 09/02/16 1728

## 2016-09-02 NOTE — ED Triage Notes (Signed)
Pt recently d/c today with same complaint. Per EMS family states in car ride home pt had another syncopal episode and wanted him brought back for further eval. Pt denies any pain. Per EMS family hit arm in car and has skin tear noted to RT elbow. VS stable.

## 2016-09-02 NOTE — Discharge Instructions (Signed)
Please have Mr. Vidales follow up with his primary care physician on Monday for reexamination. Return to the emergency department sooner for any new or worsening symptoms such as if he passes out again, no chest pain or shortness of breath, or for any other concerns.  It was a pleasure to take care of you today, and thank you for coming to our emergency department.  If you have any questions or concerns before leaving please ask the nurse to grab me and I'm more than happy to go through your aftercare instructions again.  If you were prescribed any opioid pain medication today such as Norco, Vicodin, Percocet, morphine, hydrocodone, or oxycodone please make sure you do not drive when you are taking this medication as it can alter your ability to drive safely.  If you have any concerns once you are home that you are not improving or are in fact getting worse before you can make it to your follow-up appointment, please do not hesitate to call 911 and come back for further evaluation.  Merrily Brittle MD  Results for orders placed or performed during the hospital encounter of 09/02/16  Comprehensive metabolic panel  Result Value Ref Range   Sodium 135 135 - 145 mmol/L   Potassium 4.5 3.5 - 5.1 mmol/L   Chloride 96 (L) 101 - 111 mmol/L   CO2 27 22 - 32 mmol/L   Glucose, Bld 101 (H) 65 - 99 mg/dL   BUN 13 6 - 20 mg/dL   Creatinine, Ser 4.54 0.61 - 1.24 mg/dL   Calcium 9.1 8.9 - 09.8 mg/dL   Total Protein 6.6 6.5 - 8.1 g/dL   Albumin 2.9 (L) 3.5 - 5.0 g/dL   AST 37 15 - 41 U/L   ALT 25 17 - 63 U/L   Alkaline Phosphatase 59 38 - 126 U/L   Total Bilirubin 1.2 0.3 - 1.2 mg/dL   GFR calc non Af Amer >60 >60 mL/min   GFR calc Af Amer >60 >60 mL/min   Anion gap 12 5 - 15  CBC with Differential  Result Value Ref Range   WBC 10.4 3.8 - 10.6 K/uL   RBC 4.50 4.40 - 5.90 MIL/uL   Hemoglobin 12.9 (L) 13.0 - 18.0 g/dL   HCT 11.9 (L) 14.7 - 82.9 %   MCV 86.2 80.0 - 100.0 fL   MCH 28.7 26.0 - 34.0 pg   MCHC 33.3 32.0 - 36.0 g/dL   RDW 56.2 (H) 13.0 - 86.5 %   Platelets 239 150 - 440 K/uL   Neutrophils Relative % 65 %   Neutro Abs 6.8 (H) 1.4 - 6.5 K/uL   Lymphocytes Relative 28 %   Lymphs Abs 2.9 1.0 - 3.6 K/uL   Monocytes Relative 4 %   Monocytes Absolute 0.4 0.2 - 1.0 K/uL   Eosinophils Relative 2 %   Eosinophils Absolute 0.2 0 - 0.7 K/uL   Basophils Relative 1 %   Basophils Absolute 0.1 0 - 0.1 K/uL  Urinalysis, Complete w Microscopic  Result Value Ref Range   Color, Urine AMBER (A) YELLOW   APPearance CLOUDY (A) CLEAR   Specific Gravity, Urine 1.019 1.005 - 1.030   pH 5.0 5.0 - 8.0   Glucose, UA 50 (A) NEGATIVE mg/dL   Hgb urine dipstick NEGATIVE NEGATIVE   Bilirubin Urine NEGATIVE NEGATIVE   Ketones, ur 20 (A) NEGATIVE mg/dL   Protein, ur 30 (A) NEGATIVE mg/dL   Nitrite NEGATIVE NEGATIVE   Leukocytes, UA NEGATIVE NEGATIVE  RBC / HPF 6-30 0 - 5 RBC/hpf   WBC, UA 0-5 0 - 5 WBC/hpf   Bacteria, UA NONE SEEN NONE SEEN   Squamous Epithelial / LPF NONE SEEN NONE SEEN   Mucous PRESENT    Hyaline Casts, UA PRESENT   Troponin I  Result Value Ref Range   Troponin I <0.03 <0.03 ng/mL   Dg Chest 2 View  Result Date: 08/19/2016 CLINICAL DATA:  Weakness. EXAM: CHEST  2 VIEW COMPARISON:  05/30/2016. FINDINGS: Prior CABG. Cardiomegaly. Chronic interstitial changes noted bilaterally. No focal acute infiltrate. Calcified pulmonary nodules most likely granulomas. No pleural effusion or pneumothorax. Stomach is distended. IMPRESSION: 1. Prior CABG.  Stable cardiomegaly. 2. Chronic interstitial lung disease.  No acute abnormality. 3. Gastric distention. Electronically Signed   By: Maisie Fus  Register   On: 08/19/2016 15:42   Ct Chest W Contrast  Result Date: 09/02/2016 CLINICAL DATA:  Syncopal episode.  Coughing. EXAM: CT CHEST WITH CONTRAST TECHNIQUE: Multidetector CT imaging of the chest was performed during intravenous contrast administration. CONTRAST:  75mL ISOVUE-300 IOPAMIDOL  (ISOVUE-300) INJECTION 61% COMPARISON:  None. FINDINGS: Cardiovascular: The heart is normal in size. No pericardial effusion. There is marked tortuosity and calcification of the thoracic aorta but no focal aneurysm or dissection. The branch vessels are patent. Moderate atherosclerotic calcifications. Three-vessel coronary artery calcifications are noted along with surgical changes from bypass surgery. The pulmonary arteries appear normal. The Mediastinum/Nodes: Small scattered mediastinal and hilar lymph nodes but no mass or overt adenopathy. The esophagus is grossly normal. Lungs/Pleura: Changes of chronic pulmonary interstitial fibrosis. No acute overlying pulmonary infiltrates. Moderate lower lobe peribronchial thickening and areas of atelectasis possibly reflecting bronchitis. No pleural effusion. Upper Abdomen: No significant upper abdominal findings. There is fairly significant atrophy of the pancreas. Chest wall/ Musculoskeletal: No chest wall mass, supraclavicular or axillary adenopathy. The thyroid gland is grossly normal. No significant bony findings. IMPRESSION: 1. Advanced tortuosity and calcification of the thoracic aorta but no focal aneurysm or dissection. 2. Three-vessel coronary artery calcifications and surgical changes from bypass surgery. 3. Chronic interstitial lung disease and possible superimposed lower lobe bronchitis. No infiltrates or effusions. 4. No significant upper abdominal findings. Aortic Atherosclerosis (ICD10-I70.0). Electronically Signed   By: Rudie Meyer M.D.   On: 09/02/2016 11:56   Mr Brain Wo Contrast  Result Date: 08/19/2016 CLINICAL DATA:  Altered mental status, LEFT lower extremity weakness for 1 day. Chronic cough. Evaluate transient ischemic attack. History of popliteal aneurysm, hyperlipidemia, carotid artery disease, atrial fibrillation. EXAM: MRI HEAD WITHOUT CONTRAST MRA HEAD WITHOUT CONTRAST TECHNIQUE: Multiplanar, multiecho pulse sequences of the brain and  surrounding structures were obtained without intravenous contrast. Angiographic images of the head were obtained using MRA technique without contrast. COMPARISON:  CT HEAD August 19, 2016 at 1334 hours and MRI of the head August 16, 2016 FINDINGS: MRI HEAD FINDINGS- moderately motion degraded examination. BRAIN: No reduced diffusion to suggest acute ischemia. Scattered similar micro hemorrhages. Similar linear susceptibility artifact RIGHT lateral ventricle. Severe ventriculomegaly with disproportionate sulcal effacement at the convexities. Confluent supratentorial and pontine white matter FLAIR T2 hyperintensities. No midline shift, mass effect or masses. No abnormal extra-axial fluid collections. VASCULAR: Normal major intracranial vascular flow voids present at skull base. SKULL AND UPPER CERVICAL SPINE: No abnormal sellar expansion. No suspicious calvarial bone marrow signal. Craniocervical junction maintained. SINUSES/ORBITS: Trace bilateral mastoid effusions. Trace paranasal sinus mucosal thickening. The included ocular globes and orbital contents are non-suspicious. Status post RIGHT ocular lens implant. OTHER: None. MRA  HEAD FINDINGS- severely motion degraded examination. ANTERIOR CIRCULATION: Internal carotid artery's are patent. Proximal middle cerebral artery's are patent. Distal anterior cerebral artery's are patent. No large vessel occlusion, high-grade stenosis, abnormal luminal irregularity, aneurysm. POSTERIOR CIRCULATION: Patent vertebral artery's, basilar artery and proximal posterior cerebral artery's, mid and distal segment obscured by motion. ANATOMIC VARIANTS: Not able to be ascertain. Source images and MIP images were reviewed. IMPRESSION: MRI HEAD: Moderately motion degraded examination without acute intracranial process. Imaging findings of severe chronic normal pressure hydrocephalus. Moderate to severe chronic small vessel ischemic disease. Sequelae of old RIGHT intraventricular hemorrhage.  MRA HEAD: Severely motion degraded examination. Patent skullbase vessels, limited assessment of cerebral artery's. Electronically Signed   By: Awilda Metro M.D.   On: 08/19/2016 21:46   US Carotid Bilateral (at [facility] And [facility] Only)  Result Date: 08/20/2016 CLINICAL DATA:  TIA symptoms, syncope, hyperlipidemia, tobacco use EXAM: BILATERAL CAROTID DUPLEX ULTRASOUND TECHNIQUE: Wallace Cullens scale imaging, color Doppler and duplex ultrasound were performed of bilateral carotid and vertebral arteries in the neck. COMPARISON:  08/19/2016 brain MRI FINDINGS: Criteria: Quantification of carotid stenosis is based on velocity parameters that correlate the residual internal carotid diameter with NASCET-based stenosis levels, using the diameter of the distal internal carotid lumen as the denominator for stenosis measurement. The following velocity measurements were obtained: RIGHT ICA:  118/20 cm/sec CCA:  90/13 cm/sec SYSTOLIC ICA/CCA RATIO:  1.3 DIASTOLIC ICA/CCA RATIO:  1.9 ECA:  68 cm/sec LEFT ICA:  95/20 cm/sec CCA:  85/11 cm/sec SYSTOLIC ICA/CCA RATIO:  1.1 DIASTOLIC ICA/CCA RATIO:  1.8 ECA:  81 cm/sec RIGHT CAROTID ARTERY: Minor echogenic shadowing plaque formation. No hemodynamically significant right ICA stenosis, velocity elevation, or turbulent flow. Degree of narrowing less than 50%. RIGHT VERTEBRAL ARTERY:  Antegrade LEFT CAROTID ARTERY: Similar scattered minor echogenic plaque formation. Left ICA is tortuous. No hemodynamically significant left ICA stenosis, velocity elevation, or turbulent flow. LEFT VERTEBRAL ARTERY:  Antegrade IMPRESSION: Minor carotid atherosclerosis and tortuosity. No hemodynamically significant ICA stenosis. Degree of narrowing less than 50% bilaterally. Patent antegrade vertebral flow bilaterally Electronically Signed   By: Judie Petit.  Shick M.D.   On: 08/20/2016 09:59   Dg Chest Port 1 View  Result Date: 09/02/2016 CLINICAL DATA:  Shortness breath. EXAM: PORTABLE CHEST 1 VIEW COMPARISON:   08/19/2016 .  08/20/2015.  05/12/2013. FINDINGS: Prior CABG. Heart size normal. Mild infiltrate right upper lobe cannot be excluded. Bilateral pleural-parenchymal thickening consistent scarring . Low lung volumes. No pleural effusion. No pneumothorax. Interposition of the colon under the right hemidiaphragm. IMPRESSION: 1. Mild infiltrate right upper lobe cannot be excluded. 2. Bilateral pleural-parenchymal scarring. 3. Prior CABG.  Heart size normal. Electronically Signed   By: Maisie Fus  Register   On: 09/02/2016 10:40   Mr Maxine Glenn Head/brain Wo Cm  Result Date: 08/19/2016 CLINICAL DATA:  Altered mental status, LEFT lower extremity weakness for 1 day. Chronic cough. Evaluate transient ischemic attack. History of popliteal aneurysm, hyperlipidemia, carotid artery disease, atrial fibrillation. EXAM: MRI HEAD WITHOUT CONTRAST MRA HEAD WITHOUT CONTRAST TECHNIQUE: Multiplanar, multiecho pulse sequences of the brain and surrounding structures were obtained without intravenous contrast. Angiographic images of the head were obtained using MRA technique without contrast. COMPARISON:  CT HEAD August 19, 2016 at 1334 hours and MRI of the head August 16, 2016 FINDINGS: MRI HEAD FINDINGS- moderately motion degraded examination. BRAIN: No reduced diffusion to suggest acute ischemia. Scattered similar micro hemorrhages. Similar linear susceptibility artifact RIGHT lateral ventricle. Severe ventriculomegaly with disproportionate sulcal effacement at the convexities. Confluent  supratentorial and pontine white matter FLAIR T2 hyperintensities. No midline shift, mass effect or masses. No abnormal extra-axial fluid collections. VASCULAR: Normal major intracranial vascular flow voids present at skull base. SKULL AND UPPER CERVICAL SPINE: No abnormal sellar expansion. No suspicious calvarial bone marrow signal. Craniocervical junction maintained. SINUSES/ORBITS: Trace bilateral mastoid effusions. Trace paranasal sinus mucosal thickening. The  included ocular globes and orbital contents are non-suspicious. Status post RIGHT ocular lens implant. OTHER: None. MRA HEAD FINDINGS- severely motion degraded examination. ANTERIOR CIRCULATION: Internal carotid artery's are patent. Proximal middle cerebral artery's are patent. Distal anterior cerebral artery's are patent. No large vessel occlusion, high-grade stenosis, abnormal luminal irregularity, aneurysm. POSTERIOR CIRCULATION: Patent vertebral artery's, basilar artery and proximal posterior cerebral artery's, mid and distal segment obscured by motion. ANATOMIC VARIANTS: Not able to be ascertain. Source images and MIP images were reviewed. IMPRESSION: MRI HEAD: Moderately motion degraded examination without acute intracranial process. Imaging findings of severe chronic normal pressure hydrocephalus. Moderate to severe chronic small vessel ischemic disease. Sequelae of old RIGHT intraventricular hemorrhage. MRA HEAD: Severely motion degraded examination. Patent skullbase vessels, limited assessment of cerebral artery's. Electronically Signed   By: Awilda Metroourtnay  Bloomer M.D.   On: 08/19/2016 21:46   Ct Head Code Stroke W/o Cm  Result Date: 08/19/2016 CLINICAL DATA:  Code stroke. Found in wheelchair slumped over. Minimal responsiveness. EXAM: CT HEAD WITHOUT CONTRAST TECHNIQUE: Contiguous axial images were obtained from the base of the skull through the vertex without intravenous contrast. COMPARISON:  05/30/2016. FINDINGS: Brain: No evidence for acute stroke, acute hemorrhage, mass lesion, or extra-axial fluid. Marked ventricular enlargement out of proportion to the degree of cerebral or cerebellar atrophy, concerning for normal pressure hydrocephalus. Vascular: No hyperdense vessel. Marked vascular calcification of the skull base internal carotid artery's and distal vertebral arteries. Skull: Normal. Negative for fracture or focal lesion. Sinuses/Orbits: No acute finding.  RIGHT cataract extraction. Other: None  ASPECTS ([place] Stroke Program Early CT Score) - Ganglionic level infarction (caudate, lentiform nuclei, internal capsule, insula, M1-M3 cortex): 7 - Supraganglionic infarction (M4-M6 cortex): 3 Total score (0-10 with 10 being normal): 10 IMPRESSION: 1. Marked ventricular enlargement out of proportion to the degree of cerebral volume loss, similar to the previous scan in March 2018. Concern raised for normal pressure hydrocephalus. No acute intracranial findings. 2. ASPECTS is 10 These results were called by telephone at the time of interpretation on 08/19/2016 at 1:41 pm to Dr. Rockne MenghiniANNE-CAROLINE NORMAN , who verbally acknowledged these results. Electronically Signed   By: Elsie StainJohn T Curnes M.D.   On: 08/19/2016 13:44

## 2016-09-02 NOTE — H&P (Signed)
[facility]   PATIENT NAME: [name] [name]    MR#:  [id number]  DATE OF BIRTH:  11/14/1921  DATE OF ADMISSION:  09/02/2016  PRIMARY CARE PHYSICIAN: Steele Sizerrissman, Mark A, MD   REQUESTING/REFERRING PHYSICIAN: Dr. Phineas SemenGraydon Goodman  CHIEF COMPLAINT:   Chief Complaint  Patient presents with  . Near Syncope    HISTORY OF PRESENT ILLNESS:  [name] Dimeslbert Umar  is a 83 y.o. male with a known history of CAD status post CABG, carotid stenosis, transient atrial fibrillation on eliquis, peripheral arterial disease, arthritis presents to hospital today secondary to recurrent syncopal episode. Patient is very weak and sleepy at this time, most of the history is obtained from his daughter at bedside. He was admitted to the hospital about 2 weeks ago for altered mental status and left leg weakness and was diagnosed to have TIA. Already on eliquis, so no further medication changes were made. At baseline patient is mostly wheelchair bound and has 24 x 7 caregivers. He is able to stand with 2 person assist. But mentally sharp according to her daughter. Today he was sitting in his wheelchair and was noted to have his eyes rock rolled over and passed out for less than a minute. He was noted to have stopped his breathing at the time according to the caregiver. He was brought to the emergency room, basic labs were done and was discharged home. Once he reached home while he was being transferred to his wheelchair, he had 1 more syncopal episode. EMS was called and he had a third episode while he was being brought to the hospital. Patient has chronic cough and chronic bronchitis, but none of these episodes were preceded by a coughing spell. Since all 3 of them happened while he was sitting/laying down, he did not have a fall. The daughter denies any recent fevers or chills. No complaints of nausea vomiting or chest pain. Labs are otherwise unremarkable.  PAST MEDICAL HISTORY:   Past  Medical History:  Diagnosis Date  . A-fib (HCC)   . Bradycardia    06/2010  . CAD (coronary artery disease)    nuclear 01/2008, no ischemia, EF 60%, distal anterior scar  . Carotid artery disease (HCC)    doppler 05/2009,  0-39% bilateral / doppler 06/2010 OK  . Edema    Ankle edema August, 2012  . Ejection fraction    EF 45%, echo,05/2009,apical hypo,  ?? posterior and lateral hypo  . Elevated CPK    11/23/08.Marland Kitchen.Marland Kitchen.probably from debris from popliteal aneurysm  . Fall at home July 2015  . Hx of CABG    1998  . Hyperlipidemia   . Intracranial hemorrhage (HCC)    Two small subependymal bleeds 06/2010,,ASA held  . PAD (peripheral artery disease) (HCC)    Above the knee to below the knee popliteal bypass,, right  //    Doppler,  July, 2013, patent  . Popliteal aneurysm (HCC)    .repair..11/25/2008  Dr. Darrick PennaFields  . Prominent abdominal aortic pulsation    doppler normal  . Speech problem    Neurologic event with brief infusion and speech difficulty.... January, 2011.... being assessed by neurology  February, 2011  . Syncope    .remote..no etiology  /  syncope 11/2008.. from acute leg pain  . TIA (transient ischemic attack)    Brief episode of confusion and speech difficulty, January, 2012  /  recurrent confusion speech difficulty September, 2012  . Venous insufficiency    support  hose    PAST SURGICAL HISTORY:   Past Surgical History:  Procedure Laterality Date  . CORONARY ARTERY BYPASS GRAFT  1998  . popliteal bypass  with reversed greater saphenous vein from right leg.      SOCIAL HISTORY:   Social History  Substance Use Topics  . Smoking status: Former Smoker    Years: 20.00    Types: Cigarettes    Quit date: 09/12/1946  . Smokeless tobacco: Never Used     Comment: quit appox 50 yrs ago  . Alcohol use No    FAMILY HISTORY:   Family History  Problem Relation Age of Onset  . Stroke Mother   . Diabetes Mother   . Diabetes Sister     DRUG ALLERGIES:   Allergies    Allergen Reactions  . Penicillins Hives    Has patient had a PCN reaction causing immediate rash, facial/tongue/throat swelling, SOB or lightheadedness with hypotension: no Has patient had a PCN reaction causing severe rash involving mucus membranes or skin necrosis: no Has patient had a PCN reaction that required hospitalization no Has patient had a PCN reaction occurring within the last 10 years: no If all of the above answers are "NO", then may proceed with Cephalosporin use.   . Latex Rash  . Sulfonamide Derivatives Rash    REVIEW OF SYSTEMS:   Review of Systems  Constitutional: Positive for malaise/fatigue. Negative for chills, fever and weight loss.  HENT: Negative for ear discharge, ear pain, hearing loss, nosebleeds and tinnitus.   Eyes: Negative for blurred vision, double vision and photophobia.  Respiratory: Positive for cough. Negative for hemoptysis, shortness of breath and wheezing.   Cardiovascular: Negative for chest pain, palpitations, orthopnea and leg swelling.  Gastrointestinal: Negative for abdominal pain, constipation, diarrhea, heartburn, melena, nausea and vomiting.  Genitourinary: Negative for dysuria, frequency and urgency.  Musculoskeletal: Positive for myalgias. Negative for back pain and neck pain.  Skin: Negative for rash.  Neurological: Negative for dizziness, tingling, tremors, sensory change, speech change, focal weakness and headaches.  Endo/Heme/Allergies: Does not bruise/bleed easily.  Psychiatric/Behavioral: Negative for depression.    MEDICATIONS AT HOME:   Prior to Admission medications   Medication Sig Start Date End Date Taking? Authorizing Provider  acetaminophen (TYLENOL) 325 MG tablet Take 2 tablets (650 mg total) by mouth every 6 (six) hours as needed for mild pain (or Fever >/= 101). 10/08/15   Gouru, Aruna, MD  albuterol (PROVENTIL) (2.5 MG/3ML) 0.083% nebulizer solution Take 3 mLs (2.5 mg total) by nebulization every 6 (six) hours as  needed for wheezing or shortness of breath. 08/23/16   Johnson, Megan P, DO  amiodarone (PACERONE) 200 MG tablet TAKE 1 TABLET BY MOUTH ONCE A DAY Patient taking differently: TAKE 1 TABLET BY MOUTH ONCE A DAY IN THE MORNING. 10/31/15   Particia Nearing, PA-C  apixaban (ELIQUIS) 5 MG TABS tablet Take 1 tablet (5 mg total) by mouth 2 (two) times daily. 12/20/15   Steele Sizer, MD  benzonatate (TESSALON) 100 MG capsule TAKE ONE CAPSULE BY MOUTH EVERY 8 HOURS AS NEEDED COUGH 06/27/16   Steele Sizer, MD  calcium carbonate (TUMS - DOSED IN MG ELEMENTAL CALCIUM) 500 MG chewable tablet Chew 2 tablets by mouth daily with supper.     [provider]  docusate sodium (COLACE) 100 MG capsule Take 100 mg by mouth 2 (two) times daily.     [provider]  fluticasone (FLONASE) 50 MCG/ACT nasal spray Place 1 spray  into both nostrils daily. 08/22/15   Auburn Bilberry, MD  guaiFENesin (MUCINEX) 600 MG 12 hr tablet Take 1 tablet (600 mg total) by mouth 2 (two) times daily. 08/22/15   Auburn Bilberry, MD  levETIRAcetam (KEPPRA) 500 MG tablet Take 1/2 tablet twice a day Patient taking differently: Take 250-500 mg by mouth 2 (two) times daily. Take 250 mg in the morning and 500 mg at night. 01/15/16   Van Clines, MD  Multiple Vitamin (MULTIVITAMIN WITH MINERALS) TABS tablet Take 1 tablet by mouth daily with breakfast.     [provider]  polyethylene glycol (MIRALAX / GLYCOLAX) packet Take 17 g by mouth daily.    [provider]  Vitamin D, Cholecalciferol, 1000 units TABS Take 2,000 tablets by mouth daily.     [provider]      VITAL SIGNS:  Blood pressure 112/75, pulse 66, temperature 98.7 F (37.1 C), temperature source Oral, resp. rate 16, SpO2 95 %.  PHYSICAL EXAMINATION:   Physical Exam  GENERAL:  83 y.o.-year-old elderly patient lying in the bed with no acute distress.  EYES: Pupils equal, round, reactive to light and accommodation. No  scleral icterus. Extraocular muscles intact.  HEENT: Head atraumatic, normocephalic. Oropharynx and nasopharynx clear.  NECK:  Supple, no jugular venous distention. No thyroid enlargement, no tenderness.  LUNGS: Normal breath sounds bilaterally, no wheezing, rales or crepitation. Rhonchi at the bases. No use of accessory muscles of respiration.  CARDIOVASCULAR: S1, S2 normal. No  rubs, or gallops. 2/6 systolic murmur present ABDOMEN: Soft, nontender, nondistended. Bowel sounds present. No organomegaly or mass.  EXTREMITIES: No pedal edema, cyanosis, or clubbing.  NEUROLOGIC: Cranial nerves II through XII are intact. Muscle strength 5/5 in all extremities. Sensation intact. Gait not checked. Global weakness noted. PSYCHIATRIC: The patient is alert and oriented x 2-3.  SKIN: No obvious rash, lesion, or ulcer.   LABORATORY PANEL:   CBC  Recent Labs Lab 09/02/16 1008  WBC 10.4  HGB 12.9*  HCT 38.7*  PLT 239   ------------------------------------------------------------------------------------------------------------------  Chemistries   Recent Labs Lab 09/02/16 1008  NA 135  K 4.5  CL 96*  CO2 27  GLUCOSE 101*  BUN 13  CREATININE 0.63  CALCIUM 9.1  AST 37  ALT 25  ALKPHOS 59  BILITOT 1.2   ------------------------------------------------------------------------------------------------------------------  Cardiac Enzymes  Recent Labs Lab 09/02/16 1643  TROPONINI <0.03   ------------------------------------------------------------------------------------------------------------------  RADIOLOGY:  Ct Head Wo Contrast  Result Date: 09/02/2016 CLINICAL DATA:  Syncope. Patient was in ED earlier today and passed out in EMS going home. Patient had IV contrast earlier today. EXAM: CT HEAD WITHOUT CONTRAST TECHNIQUE: Contiguous axial images were obtained from the base of the skull through the vertex without intravenous contrast. COMPARISON:  PET-CT 08/19/2016 FINDINGS: Brain:  No acute intracranial hemorrhage. No focal mass lesion. No CT evidence of acute infarction. No midline shift or mass effect. No hydrocephalus. Basilar cisterns are patent. Ventriculomegaly and cortical atrophy not changed. Vascular: No hyperdense vessel or unexpected calcification. Skull: Normal. Negative for fracture or focal lesion. Sinuses/Orbits: Paranasal sinuses and mastoid air cells are clear. Orbits are clear. Other: None. IMPRESSION: 1. No acute intracranial findings.  No change from prior. 2. Stable atrophy and ventriculomegaly. Electronically Signed   By: Genevive Bi M.D.   On: 09/02/2016 16:42   Ct Chest W Contrast  Result Date: 09/02/2016 CLINICAL DATA:  Syncopal episode.  Coughing. EXAM: CT CHEST WITH CONTRAST TECHNIQUE: Multidetector CT imaging of the chest was performed  during intravenous contrast administration. CONTRAST:  75mL ISOVUE-300 IOPAMIDOL (ISOVUE-300) INJECTION 61% COMPARISON:  None. FINDINGS: Cardiovascular: The heart is normal in size. No pericardial effusion. There is marked tortuosity and calcification of the thoracic aorta but no focal aneurysm or dissection. The branch vessels are patent. Moderate atherosclerotic calcifications. Three-vessel coronary artery calcifications are noted along with surgical changes from bypass surgery. The pulmonary arteries appear normal. The Mediastinum/Nodes: Small scattered mediastinal and hilar lymph nodes but no mass or overt adenopathy. The esophagus is grossly normal. Lungs/Pleura: Changes of chronic pulmonary interstitial fibrosis. No acute overlying pulmonary infiltrates. Moderate lower lobe peribronchial thickening and areas of atelectasis possibly reflecting bronchitis. No pleural effusion. Upper Abdomen: No significant upper abdominal findings. There is fairly significant atrophy of the pancreas. Chest wall/ Musculoskeletal: No chest wall mass, supraclavicular or axillary adenopathy. The thyroid gland is grossly normal. No significant  bony findings. IMPRESSION: 1. Advanced tortuosity and calcification of the thoracic aorta but no focal aneurysm or dissection. 2. Three-vessel coronary artery calcifications and surgical changes from bypass surgery. 3. Chronic interstitial lung disease and possible superimposed lower lobe bronchitis. No infiltrates or effusions. 4. No significant upper abdominal findings. Aortic Atherosclerosis (ICD10-I70.0). Electronically Signed   By: Rudie Meyer M.D.   On: 09/02/2016 11:56   Dg Chest Port 1 View  Result Date: 09/02/2016 CLINICAL DATA:  Shortness breath. EXAM: PORTABLE CHEST 1 VIEW COMPARISON:  08/19/2016 .  08/20/2015.  05/12/2013. FINDINGS: Prior CABG. Heart size normal. Mild infiltrate right upper lobe cannot be excluded. Bilateral pleural-parenchymal thickening consistent scarring . Low lung volumes. No pleural effusion. No pneumothorax. Interposition of the colon under the right hemidiaphragm. IMPRESSION: 1. Mild infiltrate right upper lobe cannot be excluded. 2. Bilateral pleural-parenchymal scarring. 3. Prior CABG.  Heart size normal. Electronically Signed   By: Maisie Fus  Register   On: 09/02/2016 10:40    EKG:   Orders placed or performed during the hospital encounter of 09/02/16  . Repeat EKG  . Repeat EKG  . ED EKG  . ED EKG  . EKG 12-Lead  . EKG 12-Lead    IMPRESSION AND PLAN:   Saif Peter  is a 83 y.o. male with a known history of CAD status post CABG, carotid stenosis, transient atrial fibrillation on eliquis, peripheral arterial disease, arthritis presents to hospital today secondary to recurrent syncopal episode.  #1 recurrent syncope-admit to telemetry, recycle troponins. -Echocardiogram done recently shows only minimal diastolic dysfunction. MRI of the brain was done 2 weeks ago for TIA showed significant ventriculomegaly-possible chronic normal pressure hydrocephalus. He doesn't have any cognitive deficits or urinary complaints. Not sure if that had to do anything with his  syncope. -EEG ordered. Cardiology and neurology consults. -Orthostatic blood pressure changes. -History of transient A. fib and on eliquis, currently on oral amiodarone and also in normal sinus rhythm. -Recently started on Keppra as well -will continue that.  #2 CAD-stable at this time. Monitor on telemetry. Continue cardiac medications. 2 sets of troponins are negative.  #3 chronic bronchitis and chronic cough-no recent change in his cough symptoms recently according to daughter. No fevers or chills. We'll hold off on antibiotics at this time. Follow strict aspiration precautions.  #4 DVT prophylaxis-already on eliquis   All the records are reviewed and case discussed with ED provider. Management plans discussed with the patient, family and they are in agreement.  CODE STATUS: DO NOT RESUSCITATE-discussed with his daughter at bedside  TOTAL TIME TAKING CARE OF THIS PATIENT: 50 minutes.    Enid Baas  M.D on 09/02/2016 at 5:57 PM  Between 7am to 6pm - Pager - [phone]  After 6pm go to [email or web address] - Social research officer, government  [facility]  Office  [phone]  CC: Primary care physician; Steele Sizer, MD

## 2016-09-02 NOTE — ED Triage Notes (Signed)
Pt came via EMS for syncope today. Blood sugar 103 with EMS. EMS noted coughing episode when HR dropped from 70 to 40s.  Pt has had similar episodes over last few weeks and has been seen for same without dx.

## 2016-09-02 NOTE — ED Notes (Signed)
Pt waiting in hallway until ride arrives at 1400. Consulting civil engineerCharge RN notified

## 2016-09-03 ENCOUNTER — Observation Stay (HOSPITAL_COMMUNITY): Payer: Medicare Other

## 2016-09-03 DIAGNOSIS — R55 Syncope and collapse: Secondary | ICD-10-CM

## 2016-09-03 LAB — CBC
HEMATOCRIT: 36 % — AB (ref 40.0–52.0)
Hemoglobin: 12 g/dL — ABNORMAL LOW (ref 13.0–18.0)
MCH: 28.4 pg (ref 26.0–34.0)
MCHC: 33.2 g/dL (ref 32.0–36.0)
MCV: 85.4 fL (ref 80.0–100.0)
Platelets: 236 10*3/uL (ref 150–440)
RBC: 4.22 MIL/uL — AB (ref 4.40–5.90)
RDW: 16.1 % — ABNORMAL HIGH (ref 11.5–14.5)
WBC: 6.2 10*3/uL (ref 3.8–10.6)

## 2016-09-03 LAB — BASIC METABOLIC PANEL
ANION GAP: 11 (ref 5–15)
BUN: 20 mg/dL (ref 6–20)
CO2: 26 mmol/L (ref 22–32)
Calcium: 8.6 mg/dL — ABNORMAL LOW (ref 8.9–10.3)
Chloride: 97 mmol/L — ABNORMAL LOW (ref 101–111)
Creatinine, Ser: 0.65 mg/dL (ref 0.61–1.24)
GFR calc Af Amer: >60 mL/min (ref >60)
GFR calc non Af Amer: >60 mL/min (ref >60)
GLUCOSE: 101 mg/dL — AB (ref 65–99)
POTASSIUM: 4.6 mmol/L (ref 3.5–5.1)
Sodium: 134 mmol/L — ABNORMAL LOW (ref 135–145)

## 2016-09-03 LAB — TROPONIN I
Troponin I: <0.03 ng/mL (ref <0.03)
Troponin I: <0.03 ng/mL (ref <0.03)

## 2016-09-03 MED ORDER — LEVETIRACETAM 750 MG PO TABS
750.0000 mg | ORAL_TABLET | Freq: Every day | ORAL | Status: DC
  Administered 2016-09-03: 750 mg via ORAL
  Filled 2016-09-03 (×2): qty 1

## 2016-09-03 NOTE — Progress Notes (Signed)
PT Cancellation Note  Patient Details Name: [name] [name] MRN: [id number] DOB: 03/20/1921   Cancelled Treatment:    Reason Eval/Treat Not Completed: Other (comment) (Per chart review and discussion with attending physician, patient currently at baseline level of functional status (total/dependent assist, sit/stand lift for transfers, WC level). No acute PT needs identified at this time.  Will complete order; please re-consult should needs change.)   Fermina Mishkin H. Manson PasseyBrown, PT, DPT, NCS 09/03/16, 1:12 PM [phone]

## 2016-09-03 NOTE — Consult Note (Signed)
[facility]  CARDIOLOGY CONSULT NOTE  Patient ID: TYKEL BADIE MRN: [id number] DOB/AGE: 1921-11-18 83 y.o.  Admit date: 09/02/2016 Referring Physician Nemiah Commander Primary Physician Crissman Primary Cardiologist Myrtis Ser Reason for Consultation Recurrent syncope  HPI: 83 year old gentleman referred for recurrent syncope. The patient has a history of CAD, status post CABG in 1998, carotid stenosis, atrial fibrillation on Eliquis and amiodarone, and hyperlipidemia. Much of the history was derived from the caregiver who was at the patient's bedside yesterday. According to her, the patient had a coughing spell and passed out for less than a minute. He was brought to [facility] ER where he remained in normal sinus rhythm, labs were unremarkable, and there were no neurological deficits. The patient was discharged home. When EMS transferred the patient to his chair at home, he had another syncopal episode, not associated with coughing. In route to the ER again, the patient had a recurrent episode with no reported evidence of arrythmia or sinus pauses. The patient was admitted 2 weeks ago for similar presentation with suspected TIA. 2D echocardiogram 08/20/2016 revealed normal left ventricular function with LVEF 75% with no significant valvular stenosis or insufficiencies. The caregiver states the patient has had multiple episodes of syncope since August of last year when she first started staying with him. He states that just prior to the episodes, he feels as if "something is not right." The patient denies chest pain or palpitations.   Review of systems complete and found to be negative unless listed above     Past Medical History:  Diagnosis Date  . A-fib (HCC)   . Bradycardia    06/2010  . CAD (coronary artery disease)    nuclear 01/2008, no ischemia, EF 60%, distal anterior scar  . Carotid artery disease (HCC)    doppler 05/2009,  0-39% bilateral / doppler 06/2010 [place]  . Edema    Ankle edema August, 2012   . Ejection fraction    EF 45%, echo,05/2009,apical hypo,  ?? posterior and lateral hypo  . Elevated CPK    11/23/08.Marland KitchenMarland Kitchenprobably from debris from popliteal aneurysm  . Fall at home July 2015  . Hx of CABG    1998  . Hyperlipidemia   . Intracranial hemorrhage (HCC)    Two small subependymal bleeds 06/2010,,ASA held  . PAD (peripheral artery disease) (HCC)    Above the knee to below the knee popliteal bypass,, right  //    Doppler,  July, 2013, patent  . Popliteal aneurysm (HCC)    .repair..11/25/2008  Dr. Darrick Penna  . Prominent abdominal aortic pulsation    doppler normal  . Speech problem    Neurologic event with brief infusion and speech difficulty.... January, 2011.... being assessed by neurology  February, 2011  . Syncope    .remote..no etiology  /  syncope 11/2008.. from acute leg pain  . TIA (transient ischemic attack)    Brief episode of confusion and speech difficulty, January, 2012  /  recurrent confusion speech difficulty September, 2012  . Venous insufficiency    support hose    Past Surgical History:  Procedure Laterality Date  . CORONARY ARTERY BYPASS GRAFT  1998  . popliteal bypass  with reversed greater saphenous vein from right leg.      Prescriptions Prior to Admission  Medication Sig Dispense Refill Last Dose  . amiodarone (PACERONE) 200 MG tablet TAKE 1 TABLET BY MOUTH ONCE A DAY (Patient taking differently: TAKE 1 TABLET BY MOUTH ONCE A DAY IN THE MORNING.) 30 tablet 0 09/01/2016  at Unknown time  . apixaban (ELIQUIS) 5 MG TABS tablet Take 1 tablet (5 mg total) by mouth 2 (two) times daily. 180 tablet 4 09/01/2016 at Unknown time  . calcium carbonate (TUMS - DOSED IN MG ELEMENTAL CALCIUM) 500 MG chewable tablet Chew 2 tablets by mouth daily with supper.    09/01/2016 at Unknown time  . docusate sodium (COLACE) 100 MG capsule Take 100 mg by mouth 2 (two) times daily.    09/01/2016 at Unknown time  . fluticasone (FLONASE) 50 MCG/ACT nasal spray Place 1 spray into both nostrils  daily.  2 09/01/2016 at Unknown time  . levETIRAcetam (KEPPRA) 500 MG tablet Take 1/2 tablet twice a day (Patient taking differently: Take 250-500 mg by mouth 2 (two) times daily. Take 250 mg in the morning and 500 mg at night.) 90 tablet 3 09/01/2016 at Unknown time  . Multiple Vitamin (MULTIVITAMIN WITH MINERALS) TABS tablet Take 1 tablet by mouth daily with breakfast.    09/01/2016 at Unknown time  . polyethylene glycol (MIRALAX / GLYCOLAX) packet Take 17 g by mouth daily.   09/01/2016 at Unknown time  . Vitamin D, Cholecalciferol, 1000 units TABS Take 2,000 tablets by mouth daily.    09/01/2016 at Unknown time  . acetaminophen (TYLENOL) 325 MG tablet Take 2 tablets (650 mg total) by mouth every 6 (six) hours as needed for mild pain (or Fever >/= 101).   prn at prn  . albuterol (PROVENTIL) (2.5 MG/3ML) 0.083% nebulizer solution Take 3 mLs (2.5 mg total) by nebulization every 6 (six) hours as needed for wheezing or shortness of breath. 150 mL 6 prn at prn  . benzonatate (TESSALON) 100 MG capsule TAKE ONE CAPSULE BY MOUTH EVERY 8 HOURS AS NEEDED COUGH 90 capsule 0 prn at prn  . guaiFENesin (MUCINEX) 600 MG 12 hr tablet Take 1 tablet (600 mg total) by mouth 2 (two) times daily.   prn at prn   Social History   Social History  . Marital status: Married    Spouse name: N/A  . Number of children: N/A  . Years of education: N/A   Occupational History  . retired    Social History Main Topics  . Smoking status: Former Smoker    Years: 20.00    Types: Cigarettes    Quit date: 09/12/1946  . Smokeless tobacco: Never Used     Comment: quit appox 50 yrs ago  . Alcohol use No  . Drug use: No  . Sexual activity: Not on file   Other Topics Concern  . Not on file   Social History Narrative  . No narrative on file    Family History  Problem Relation Age of Onset  . Stroke Mother   . Diabetes Mother   . Diabetes Sister       Review of systems complete and found to be negative unless listed above       PHYSICAL EXAM  General: Well developed, well nourished, in no acute distress HEENT:  Normocephalic and atramatic Neck:  No JVD.  Lungs: Clear bilaterally to auscultation, no wheezing, crackles. Normal effort of breathing. Heart: HRRR . Normal S1 and S2 without gallops or murmurs.  Abdomen: Bowel sounds are positive, abdomen soft and non-tender  Msk:  Patient lying in bed. Decreased muscle strength throughout Extremities: No clubbing, cyanosis or edema.   Neuro: Alert and oriented. Psych:  Good affect, slow speech, responds appropriately  Labs:   Lab Results  Component Value Date  WBC 10.4 09/02/2016   HGB 12.9 (L) 09/02/2016   HCT 38.7 (L) 09/02/2016   MCV 86.2 09/02/2016   PLT 239 09/02/2016    Recent Labs Lab 09/02/16 1008  NA 135  K 4.5  CL 96*  CO2 27  BUN 13  CREATININE 0.63  CALCIUM 9.1  PROT 6.6  BILITOT 1.2  ALKPHOS 59  ALT 25  AST 37  GLUCOSE 101*   Lab Results  Component Value Date   CKTOTAL 160 06/27/2010   CKMB 4.4 08/20/2015   TROPONINI <0.03 09/03/2016    Lab Results  Component Value Date   CHOL 142 08/20/2016   CHOL 128 12/20/2015   CHOL 138 08/17/2015   Lab Results  Component Value Date   HDL 48 08/20/2016   HDL 41 12/20/2015   HDL 41 08/17/2015   Lab Results  Component Value Date   LDLCALC 84 08/20/2016   LDLCALC 65 12/20/2015   LDLCALC 87 08/17/2015   Lab Results  Component Value Date   TRIG 48 08/20/2016   TRIG 110 12/20/2015   TRIG 52 08/17/2015   Lab Results  Component Value Date   CHOLHDL 3.0 08/20/2016   CHOLHDL 3.1 12/20/2015   CHOLHDL 3.4 08/17/2015   No results found for: LDLDIRECT    Radiology: Dg Chest 2 View  Result Date: 08/19/2016 CLINICAL DATA:  Weakness. EXAM: CHEST  2 VIEW COMPARISON:  05/30/2016. FINDINGS: Prior CABG. Cardiomegaly. Chronic interstitial changes noted bilaterally. No focal acute infiltrate. Calcified pulmonary nodules most likely granulomas. No pleural effusion or pneumothorax.  Stomach is distended. IMPRESSION: 1. Prior CABG.  Stable cardiomegaly. 2. Chronic interstitial lung disease.  No acute abnormality. 3. Gastric distention. Electronically Signed   By: Maisie Fus  Register   On: 08/19/2016 15:42   Ct Head Wo Contrast  Result Date: 09/02/2016 CLINICAL DATA:  Syncope. Patient was in ED earlier today and passed out in EMS going home. Patient had IV contrast earlier today. EXAM: CT HEAD WITHOUT CONTRAST TECHNIQUE: Contiguous axial images were obtained from the base of the skull through the vertex without intravenous contrast. COMPARISON:  PET-CT 08/19/2016 FINDINGS: Brain: No acute intracranial hemorrhage. No focal mass lesion. No CT evidence of acute infarction. No midline shift or mass effect. No hydrocephalus. Basilar cisterns are patent. Ventriculomegaly and cortical atrophy not changed. Vascular: No hyperdense vessel or unexpected calcification. Skull: Normal. Negative for fracture or focal lesion. Sinuses/Orbits: Paranasal sinuses and mastoid air cells are clear. Orbits are clear. Other: None. IMPRESSION: 1. No acute intracranial findings.  No change from prior. 2. Stable atrophy and ventriculomegaly. Electronically Signed   By: Genevive Bi M.D.   On: 09/02/2016 16:42   Ct Chest W Contrast  Result Date: 09/02/2016 CLINICAL DATA:  Syncopal episode.  Coughing. EXAM: CT CHEST WITH CONTRAST TECHNIQUE: Multidetector CT imaging of the chest was performed during intravenous contrast administration. CONTRAST:  75mL ISOVUE-300 IOPAMIDOL (ISOVUE-300) INJECTION 61% COMPARISON:  None. FINDINGS: Cardiovascular: The heart is normal in size. No pericardial effusion. There is marked tortuosity and calcification of the thoracic aorta but no focal aneurysm or dissection. The branch vessels are patent. Moderate atherosclerotic calcifications. Three-vessel coronary artery calcifications are noted along with surgical changes from bypass surgery. The pulmonary arteries appear normal. The  Mediastinum/Nodes: Small scattered mediastinal and hilar lymph nodes but no mass or overt adenopathy. The esophagus is grossly normal. Lungs/Pleura: Changes of chronic pulmonary interstitial fibrosis. No acute overlying pulmonary infiltrates. Moderate lower lobe peribronchial thickening and areas of atelectasis possibly reflecting bronchitis. No  pleural effusion. Upper Abdomen: No significant upper abdominal findings. There is fairly significant atrophy of the pancreas. Chest wall/ Musculoskeletal: No chest wall mass, supraclavicular or axillary adenopathy. The thyroid gland is grossly normal. No significant bony findings. IMPRESSION: 1. Advanced tortuosity and calcification of the thoracic aorta but no focal aneurysm or dissection. 2. Three-vessel coronary artery calcifications and surgical changes from bypass surgery. 3. Chronic interstitial lung disease and possible superimposed lower lobe bronchitis. No infiltrates or effusions. 4. No significant upper abdominal findings. Aortic Atherosclerosis (ICD10-I70.0). Electronically Signed   By: Rudie Meyer M.D.   On: 09/02/2016 11:56   Mr Brain Wo Contrast  Result Date: 08/19/2016 CLINICAL DATA:  Altered mental status, LEFT lower extremity weakness for 1 day. Chronic cough. Evaluate transient ischemic attack. History of popliteal aneurysm, hyperlipidemia, carotid artery disease, atrial fibrillation. EXAM: MRI HEAD WITHOUT CONTRAST MRA HEAD WITHOUT CONTRAST TECHNIQUE: Multiplanar, multiecho pulse sequences of the brain and surrounding structures were obtained without intravenous contrast. Angiographic images of the head were obtained using MRA technique without contrast. COMPARISON:  CT HEAD August 19, 2016 at 1334 hours and MRI of the head August 16, 2016 FINDINGS: MRI HEAD FINDINGS- moderately motion degraded examination. BRAIN: No reduced diffusion to suggest acute ischemia. Scattered similar micro hemorrhages. Similar linear susceptibility artifact RIGHT lateral  ventricle. Severe ventriculomegaly with disproportionate sulcal effacement at the convexities. Confluent supratentorial and pontine white matter FLAIR T2 hyperintensities. No midline shift, mass effect or masses. No abnormal extra-axial fluid collections. VASCULAR: Normal major intracranial vascular flow voids present at skull base. SKULL AND UPPER CERVICAL SPINE: No abnormal sellar expansion. No suspicious calvarial bone marrow signal. Craniocervical junction maintained. SINUSES/ORBITS: Trace bilateral mastoid effusions. Trace paranasal sinus mucosal thickening. The included ocular globes and orbital contents are non-suspicious. Status post RIGHT ocular lens implant. OTHER: None. MRA HEAD FINDINGS- severely motion degraded examination. ANTERIOR CIRCULATION: Internal carotid artery's are patent. Proximal middle cerebral artery's are patent. Distal anterior cerebral artery's are patent. No large vessel occlusion, high-grade stenosis, abnormal luminal irregularity, aneurysm. POSTERIOR CIRCULATION: Patent vertebral artery's, basilar artery and proximal posterior cerebral artery's, mid and distal segment obscured by motion. ANATOMIC VARIANTS: Not able to be ascertain. Source images and MIP images were reviewed. IMPRESSION: MRI HEAD: Moderately motion degraded examination without acute intracranial process. Imaging findings of severe chronic normal pressure hydrocephalus. Moderate to severe chronic small vessel ischemic disease. Sequelae of old RIGHT intraventricular hemorrhage. MRA HEAD: Severely motion degraded examination. Patent skullbase vessels, limited assessment of cerebral artery's. Electronically Signed   By: Awilda Metro M.D.   On: 08/19/2016 21:46   US Carotid Bilateral (at [facility] And [facility] Only)  Result Date: 08/20/2016 CLINICAL DATA:  TIA symptoms, syncope, hyperlipidemia, tobacco use EXAM: BILATERAL CAROTID DUPLEX ULTRASOUND TECHNIQUE: Wallace Cullens scale imaging, color Doppler and duplex ultrasound were  performed of bilateral carotid and vertebral arteries in the neck. COMPARISON:  08/19/2016 brain MRI FINDINGS: Criteria: Quantification of carotid stenosis is based on velocity parameters that correlate the residual internal carotid diameter with NASCET-based stenosis levels, using the diameter of the distal internal carotid lumen as the denominator for stenosis measurement. The following velocity measurements were obtained: RIGHT ICA:  118/20 cm/sec CCA:  90/13 cm/sec SYSTOLIC ICA/CCA RATIO:  1.3 DIASTOLIC ICA/CCA RATIO:  1.9 ECA:  68 cm/sec LEFT ICA:  95/20 cm/sec CCA:  85/11 cm/sec SYSTOLIC ICA/CCA RATIO:  1.1 DIASTOLIC ICA/CCA RATIO:  1.8 ECA:  81 cm/sec RIGHT CAROTID ARTERY: Minor echogenic shadowing plaque formation. No hemodynamically significant right ICA stenosis, velocity elevation,  or turbulent flow. Degree of narrowing less than 50%. RIGHT VERTEBRAL ARTERY:  Antegrade LEFT CAROTID ARTERY: Similar scattered minor echogenic plaque formation. Left ICA is tortuous. No hemodynamically significant left ICA stenosis, velocity elevation, or turbulent flow. LEFT VERTEBRAL ARTERY:  Antegrade IMPRESSION: Minor carotid atherosclerosis and tortuosity. No hemodynamically significant ICA stenosis. Degree of narrowing less than 50% bilaterally. Patent antegrade vertebral flow bilaterally Electronically Signed   By: Judie Petit.  Shick M.D.   On: 08/20/2016 09:59   Dg Chest Port 1 View  Result Date: 09/02/2016 CLINICAL DATA:  Shortness breath. EXAM: PORTABLE CHEST 1 VIEW COMPARISON:  08/19/2016 .  08/20/2015.  05/12/2013. FINDINGS: Prior CABG. Heart size normal. Mild infiltrate right upper lobe cannot be excluded. Bilateral pleural-parenchymal thickening consistent scarring . Low lung volumes. No pleural effusion. No pneumothorax. Interposition of the colon under the right hemidiaphragm. IMPRESSION: 1. Mild infiltrate right upper lobe cannot be excluded. 2. Bilateral pleural-parenchymal scarring. 3. Prior CABG.  Heart size  normal. Electronically Signed   By: Maisie Fus  Register   On: 09/02/2016 10:40   Mr Maxine Glenn Head/brain Wo Cm  Result Date: 08/19/2016 CLINICAL DATA:  Altered mental status, LEFT lower extremity weakness for 1 day. Chronic cough. Evaluate transient ischemic attack. History of popliteal aneurysm, hyperlipidemia, carotid artery disease, atrial fibrillation. EXAM: MRI HEAD WITHOUT CONTRAST MRA HEAD WITHOUT CONTRAST TECHNIQUE: Multiplanar, multiecho pulse sequences of the brain and surrounding structures were obtained without intravenous contrast. Angiographic images of the head were obtained using MRA technique without contrast. COMPARISON:  CT HEAD August 19, 2016 at 1334 hours and MRI of the head August 16, 2016 FINDINGS: MRI HEAD FINDINGS- moderately motion degraded examination. BRAIN: No reduced diffusion to suggest acute ischemia. Scattered similar micro hemorrhages. Similar linear susceptibility artifact RIGHT lateral ventricle. Severe ventriculomegaly with disproportionate sulcal effacement at the convexities. Confluent supratentorial and pontine white matter FLAIR T2 hyperintensities. No midline shift, mass effect or masses. No abnormal extra-axial fluid collections. VASCULAR: Normal major intracranial vascular flow voids present at skull base. SKULL AND UPPER CERVICAL SPINE: No abnormal sellar expansion. No suspicious calvarial bone marrow signal. Craniocervical junction maintained. SINUSES/ORBITS: Trace bilateral mastoid effusions. Trace paranasal sinus mucosal thickening. The included ocular globes and orbital contents are non-suspicious. Status post RIGHT ocular lens implant. OTHER: None. MRA HEAD FINDINGS- severely motion degraded examination. ANTERIOR CIRCULATION: Internal carotid artery's are patent. Proximal middle cerebral artery's are patent. Distal anterior cerebral artery's are patent. No large vessel occlusion, high-grade stenosis, abnormal luminal irregularity, aneurysm. POSTERIOR CIRCULATION: Patent  vertebral artery's, basilar artery and proximal posterior cerebral artery's, mid and distal segment obscured by motion. ANATOMIC VARIANTS: Not able to be ascertain. Source images and MIP images were reviewed. IMPRESSION: MRI HEAD: Moderately motion degraded examination without acute intracranial process. Imaging findings of severe chronic normal pressure hydrocephalus. Moderate to severe chronic small vessel ischemic disease. Sequelae of old RIGHT intraventricular hemorrhage. MRA HEAD: Severely motion degraded examination. Patent skullbase vessels, limited assessment of cerebral artery's. Electronically Signed   By: Awilda Metro M.D.   On: 08/19/2016 21:46   Ct Head Code Stroke W/o Cm  Result Date: 08/19/2016 CLINICAL DATA:  Code stroke. Found in wheelchair slumped over. Minimal responsiveness. EXAM: CT HEAD WITHOUT CONTRAST TECHNIQUE: Contiguous axial images were obtained from the base of the skull through the vertex without intravenous contrast. COMPARISON:  05/30/2016. FINDINGS: Brain: No evidence for acute stroke, acute hemorrhage, mass lesion, or extra-axial fluid. Marked ventricular enlargement out of proportion to the degree of cerebral or cerebellar atrophy, concerning for normal  pressure hydrocephalus. Vascular: No hyperdense vessel. Marked vascular calcification of the skull base internal carotid artery's and distal vertebral arteries. Skull: Normal. Negative for fracture or focal lesion. Sinuses/Orbits: No acute finding.  RIGHT cataract extraction. Other: None ASPECTS ([place] Stroke Program Early CT Score) - Ganglionic level infarction (caudate, lentiform nuclei, internal capsule, insula, M1-M3 cortex): 7 - Supraganglionic infarction (M4-M6 cortex): 3 Total score (0-10 with 10 being normal): 10 IMPRESSION: 1. Marked ventricular enlargement out of proportion to the degree of cerebral volume loss, similar to the previous scan in March 2018. Concern raised for normal pressure hydrocephalus. No  acute intracranial findings. 2. ASPECTS is 10 These results were called by telephone at the time of interpretation on 08/19/2016 at 1:41 pm to Dr. Rockne Menghini , who verbally acknowledged these results. Electronically Signed   By: Elsie Stain M.D.   On: 08/19/2016 13:44    EKG: Sinus rhythm, rate 70 bpm  ASSESSMENT AND PLAN:  1. Syncope, does not appear to be cardiac in nature as no evidence of arrhythmia or pauses with normal LV function and no significant valvular heart disease; no hemodynamically significant carotid stenosis bilaterally; will continue to monitor. 2. Atrial fibrillation, rate and rhythm controlled, on Eliquis and amiodarone.  3. On chronic amiodarone; TSH and LFTs within normal range, chronic interstitial lung disease and possible superimposed lower lobe bronchitis on chest CT, 09/02/16 4. CAD, status post CABG in 1998, negative troponin, no chest pain  Recommendations: 1. Continue current therapy. 2. Continue to monitor on telemetry. 3. Consider neurology consult. 4. Continue Eliquis for stroke prevention. 5. No further cardiac diagnostics at this time. 6. Further recommendations pending patient's initial course.  Signed: Leanora Ivanoff PA-C  09/03/2016, 8:09 AM

## 2016-09-03 NOTE — Care Management Obs Status (Signed)
MEDICARE OBSERVATION STATUS NOTIFICATION   Patient Details  Name: [name] [name] MRN: [id number] Date of Birth: 06/14/1921   Medicare Observation Status Notification Given:  Yes (signed by daughter)    Chapman FitchBOWEN, Betta Balla T, RN 09/03/2016, 5:06 PM

## 2016-09-03 NOTE — Consult Note (Addendum)
Reason for Consult: syncope  Referring Physician: Dr. Nemiah Commander   CC: sycnope  HPI: [name] [name] is an 83 y.o. male with a known history of CAD status post CABG, carotid stenosis, transient atrial fibrillation on eliquis, peripheral arterial disease, arthritis presents to hospital today secondary to recurrent syncopal episode. As per son at bedside pt had about 3 of them yesterday and multiple in the past few months.   He was admitted to the hospital about 2 weeks ago for altered mental status and left leg weakness and was diagnosed to have TIA. Already on eliquis, so no further medication changes were made. At baseline patient is mostly wheelchair bound and has 24 x 7 caregivers but mentally sharp according to her daughter. Yesterday pt was sitting in his wheelchair and was noted to have his eyes rock rolled over and passed out for less than a minute. Pt had 3 similar episodes in past.     Past Medical History:  Diagnosis Date  . A-fib (HCC)   . Bradycardia    06/2010  . CAD (coronary artery disease)    nuclear 01/2008, no ischemia, EF 60%, distal anterior scar  . Carotid artery disease (HCC)    doppler 05/2009,  0-39% bilateral / doppler 06/2010 OK  . Edema    Ankle edema August, 2012  . Ejection fraction    EF 45%, echo,05/2009,apical hypo,  ?? posterior and lateral hypo  . Elevated CPK    11/23/08.Marland KitchenMarland Kitchenprobably from debris from popliteal aneurysm  . Fall at home July 2015  . Hx of CABG    1998  . Hyperlipidemia   . Intracranial hemorrhage (HCC)    Two small subependymal bleeds 06/2010,,ASA held  . PAD (peripheral artery disease) (HCC)    Above the knee to below the knee popliteal bypass,, right  //    Doppler,  July, 2013, patent  . Popliteal aneurysm (HCC)    .repair..11/25/2008  Dr. Darrick Penna  . Prominent abdominal aortic pulsation    doppler normal  . Speech problem    Neurologic event with brief infusion and speech difficulty.... January, 2011.... being assessed by neurology   February, 2011  . Syncope    .remote..no etiology  /  syncope 11/2008.. from acute leg pain  . TIA (transient ischemic attack)    Brief episode of confusion and speech difficulty, January, 2012  /  recurrent confusion speech difficulty September, 2012  . Venous insufficiency    support hose    Past Surgical History:  Procedure Laterality Date  . CORONARY ARTERY BYPASS GRAFT  1998  . popliteal bypass  with reversed greater saphenous vein from right leg.      Family History  Problem Relation Age of Onset  . Stroke Mother   . Diabetes Mother   . Diabetes Sister     Social History:  reports that he quit smoking about 70 years ago. His smoking use included Cigarettes. He quit after 20.00 years of use. He has never used smokeless tobacco. He reports that he does not drink alcohol or use drugs.  Allergies  Allergen Reactions  . Penicillins Hives    Has patient had a PCN reaction causing immediate rash, facial/tongue/throat swelling, SOB or lightheadedness with hypotension: no Has patient had a PCN reaction causing severe rash involving mucus membranes or skin necrosis: no Has patient had a PCN reaction that required hospitalization no Has patient had a PCN reaction occurring within the last 10 years: no If all of the above answers are "  NO", then may proceed with Cephalosporin use.   . Latex Rash  . Sulfonamide Derivatives Rash    Medications: I have reviewed the patient's current medications.  ROS: The patient   General ROS: negative for - chills, fatigue, fever, night sweats, weight gain or weight loss Psychological ROS: negative for - behavioral disorder, hallucinations, memory difficulties, mood swings or suicidal ideation Ophthalmic ROS: negative for - blurry vision, double vision, eye pain or loss of vision ENT ROS: negative for - epistaxis, nasal discharge, oral lesions, sore throat, tinnitus or vertigo Allergy and Immunology ROS: negative for - hives or itchy/watery  eyes Hematological and Lymphatic ROS: negative for - bleeding problems, bruising or swollen lymph nodes Endocrine ROS: negative for - galactorrhea, hair pattern changes, polydipsia/polyuria or temperature intolerance Respiratory ROS: negative for - cough, hemoptysis, shortness of breath or wheezing Cardiovascular ROS: negative for - chest pain, dyspnea on exertion, edema or irregular heartbeat Gastrointestinal ROS: negative for - abdominal pain, diarrhea, hematemesis, nausea/vomiting or stool incontinence Genito-Urinary ROS: negative for - dysuria, hematuria, incontinence or urinary frequency/urgency Musculoskeletal ROS: negative for - joint swelling or muscular weakness Neurological ROS: as noted in HPI Dermatological ROS: negative for rash and skin lesion changes  Physical Examination: Blood pressure (!) 113/57, pulse 69, temperature 98.4 F (36.9 C), temperature source Oral, resp. rate 18, weight 74.9 kg (165 lb 3.2 oz), SpO2 94 %.    Neurological Examination   Mental Status: Alert, oriented, thought content appropriate.  Speech dysarthric but that is his baseline.  Able to follow 3 step commands without difficulty. Cranial Nerves: II: Discs flat bilaterally; Visual fields grossly normal, pupils equal, round, reactive to light and accommodation III,IV, VI: ptosis not present, extra-ocular motions intact bilaterally V,VII: smile symmetric, facial light touch sensation normal bilaterally VIII: hearing normal bilaterally IX,X: gag reflex present XI: bilateral shoulder shrug XII: midline tongue extension Motor: Right : Upper extremity   4+/5    Left:     Upper extremity   4+/5  Lower extremity   3/5     Lower extremity   3/5 Tone and bulk:normal tone throughout; no atrophy noted Sensory: Pinprick and light touch intact throughout, bilaterally Deep Tendon Reflexes: 1+ and symmetric throughout Plantars: Right: downgoing   Left: downgoing Cerebellar: normal finger-to-nose Gait: not  tested      Laboratory Studies:   Basic Metabolic Panel:  Recent Labs Lab 09/02/16 1008 09/03/16 0806  NA 135 134*  K 4.5 4.6  CL 96* 97*  CO2 27 26  GLUCOSE 101* 101*  BUN 13 20  CREATININE 0.63 0.65  CALCIUM 9.1 8.6*    Liver Function Tests:  Recent Labs Lab 09/02/16 1008  AST 37  ALT 25  ALKPHOS 59  BILITOT 1.2  PROT 6.6  ALBUMIN 2.9*   No results for input(s): LIPASE, AMYLASE in the last 168 hours. No results for input(s): AMMONIA in the last 168 hours.  CBC:  Recent Labs Lab 09/02/16 1008 09/03/16 0806  WBC 10.4 6.2  NEUTROABS 6.8*  --   HGB 12.9* 12.0*  HCT 38.7* 36.0*  MCV 86.2 85.4  PLT 239 236    Cardiac Enzymes:  Recent Labs Lab 09/02/16 1008 09/02/16 1643 09/02/16 2213 09/03/16 0240 09/03/16 0806  TROPONINI <0.03 <0.03 <0.03 <0.03 <0.03    BNP: Invalid input(s): POCBNP  CBG: No results for input(s): GLUCAP in the last 168 hours.  Microbiology: Results for orders placed or performed in visit on 11/08/15  Microscopic Examination     Status:  Abnormal   Collection Time: 11/09/15  8:08 AM  Result Value Ref Range Status   WBC, UA None seen 0 - 5 /hpf Final   RBC, UA 0-2 0 - 2 /hpf Final   Epithelial Cells (non renal) 0-10 0 - 10 /hpf Final   Crystals Present (A) N/A Final   Crystal Type Calcium Oxalate N/A Final   Mucus, UA Present Not Estab. Final   Bacteria, UA Few None seen/Few Final    Coagulation Studies: No results for input(s): LABPROT, INR in the last 72 hours.  Urinalysis:  Recent Labs Lab 09/02/16 1008  COLORURINE AMBER*  LABSPEC 1.019  PHURINE 5.0  GLUCOSEU 50*  HGBUR NEGATIVE  BILIRUBINUR NEGATIVE  KETONESUR 20*  PROTEINUR 30*  NITRITE NEGATIVE  LEUKOCYTESUR NEGATIVE    Lipid Panel:     Component Value Date/Time   CHOL 142 08/20/2016 0455   CHOL 128 12/20/2015 1455   TRIG 48 08/20/2016 0455   HDL 48 08/20/2016 0455   HDL 41 12/20/2015 1455   CHOLHDL 3.0 08/20/2016 0455   VLDL 10 08/20/2016  0455   LDLCALC 84 08/20/2016 0455   LDLCALC 65 12/20/2015 1455    HgbA1C:  Lab Results  Component Value Date   HGBA1C 5.3 08/17/2015    Urine Drug Screen:  No results found for: LABOPIA, COCAINSCRNUR, LABBENZ, AMPHETMU, THCU, LABBARB  Alcohol Level: No results for input(s): ETH in the last 168 hours.   Afib on EKG  Imaging: Ct Head Wo Contrast  Result Date: 09/02/2016 CLINICAL DATA:  Syncope. Patient was in ED earlier today and passed out in EMS going home. Patient had IV contrast earlier today. EXAM: CT HEAD WITHOUT CONTRAST TECHNIQUE: Contiguous axial images were obtained from the base of the skull through the vertex without intravenous contrast. COMPARISON:  PET-CT 08/19/2016 FINDINGS: Brain: No acute intracranial hemorrhage. No focal mass lesion. No CT evidence of acute infarction. No midline shift or mass effect. No hydrocephalus. Basilar cisterns are patent. Ventriculomegaly and cortical atrophy not changed. Vascular: No hyperdense vessel or unexpected calcification. Skull: Normal. Negative for fracture or focal lesion. Sinuses/Orbits: Paranasal sinuses and mastoid air cells are clear. Orbits are clear. Other: None. IMPRESSION: 1. No acute intracranial findings.  No change from prior. 2. Stable atrophy and ventriculomegaly. Electronically Signed   By: Genevive Bi M.D.   On: 09/02/2016 16:42   Ct Chest W Contrast  Result Date: 09/02/2016 CLINICAL DATA:  Syncopal episode.  Coughing. EXAM: CT CHEST WITH CONTRAST TECHNIQUE: Multidetector CT imaging of the chest was performed during intravenous contrast administration. CONTRAST:  75mL ISOVUE-300 IOPAMIDOL (ISOVUE-300) INJECTION 61% COMPARISON:  None. FINDINGS: Cardiovascular: The heart is normal in size. No pericardial effusion. There is marked tortuosity and calcification of the thoracic aorta but no focal aneurysm or dissection. The branch vessels are patent. Moderate atherosclerotic calcifications. Three-vessel coronary artery  calcifications are noted along with surgical changes from bypass surgery. The pulmonary arteries appear normal. The Mediastinum/Nodes: Small scattered mediastinal and hilar lymph nodes but no mass or overt adenopathy. The esophagus is grossly normal. Lungs/Pleura: Changes of chronic pulmonary interstitial fibrosis. No acute overlying pulmonary infiltrates. Moderate lower lobe peribronchial thickening and areas of atelectasis possibly reflecting bronchitis. No pleural effusion. Upper Abdomen: No significant upper abdominal findings. There is fairly significant atrophy of the pancreas. Chest wall/ Musculoskeletal: No chest wall mass, supraclavicular or axillary adenopathy. The thyroid gland is grossly normal. No significant bony findings. IMPRESSION: 1. Advanced tortuosity and calcification of the thoracic aorta but no focal  aneurysm or dissection. 2. Three-vessel coronary artery calcifications and surgical changes from bypass surgery. 3. Chronic interstitial lung disease and possible superimposed lower lobe bronchitis. No infiltrates or effusions. 4. No significant upper abdominal findings. Aortic Atherosclerosis (ICD10-I70.0). Electronically Signed   By: Rudie Meyer M.D.   On: 09/02/2016 11:56   Dg Chest Port 1 View  Result Date: 09/02/2016 CLINICAL DATA:  Shortness breath. EXAM: PORTABLE CHEST 1 VIEW COMPARISON:  08/19/2016 .  08/20/2015.  05/12/2013. FINDINGS: Prior CABG. Heart size normal. Mild infiltrate right upper lobe cannot be excluded. Bilateral pleural-parenchymal thickening consistent scarring . Low lung volumes. No pleural effusion. No pneumothorax. Interposition of the colon under the right hemidiaphragm. IMPRESSION: 1. Mild infiltrate right upper lobe cannot be excluded. 2. Bilateral pleural-parenchymal scarring. 3. Prior CABG.  Heart size normal. Electronically Signed   By: Maisie Fus  Register   On: 09/02/2016 10:40     Assessment/Plan:  83 y.o. male with a known history of CAD status post  CABG, carotid stenosis, transient atrial fibrillation on eliquis, peripheral arterial disease, arthritis presents to hospital today secondary to recurrent syncopal episode. As per son at bedside pt had about 3 of them yesterday and multiple in the past few months.   He was admitted to the hospital about 2 weeks ago for altered mental status and left leg weakness and was diagnosed to have TIA. Already on eliquis, so no further medication changes were made. At baseline patient is mostly wheelchair bound and has 24 x 7 caregivers but mentally sharp according to her daughter. Yesterday pt was sitting in his wheelchair and was noted to have his eyes rock rolled over and passed out for less than a minute. Pt had 3 similar episodes in past.     I have seen Mr. Mustin about a year ago with witnessed shaking episode and unresponsiveness.  MRI brain and EEG was done which were normal. I started pt on Keppra 500 BID.  Pt has been having similar type of syncope episodes in the past.   - appreciate Cardiology evaluation - repeat EEG ordered not sure it will be helpful as just 20 minute routine EEG - Pt was on Keppra 250 during day and 500 at night as it was causing somnolence during the day. Will increased to 750 at night - Do believe that if symptoms persist pt might need continuous EEG monitoring in a bigger center in the future. Pt does follow up with [facility] and hopefully we an set up prolong EEG as out pt.   - d/w family at bedside.    Pauletta Browns  09/03/2016, 10:56 AM

## 2016-09-03 NOTE — Care Management (Signed)
Patient admitted from home with syncope.  Patient lives at home with wife.  Has 24/7 private duty caregivers in the home.  Assessment completed with daughter who is at bedside.  PCP [facility].  Pharmacy [facility].  Patient was previously open with [facility].  Daughter states that at baselines patient has to use [facility]  Daughter states that patient is mobility at his baseline. If nursing is indicated at discharge [facility]  RNCM following

## 2016-09-03 NOTE — Progress Notes (Signed)
Admitted to floor at beginning of shift. Alert with some confusion to time and situation. Bedbound. No complaints. Sitter at side. Respirations even and unlabored on RA. For EEG today. Neuro to see.

## 2016-09-03 NOTE — Consult Note (Signed)
WOC Nurse wound consult note Reason for Consult:Multiple skin tears and abrasions to upper and lower extremities.  Scattered bruising.  Right cubital fossa with skin tear.  Edges approximated.  Sacral stage 2 pressure injury, present on admission.  Wound type:Trauma and pressure.  Pressure Injury POA: Yes Measurement: Sacrum 1 cm x 1 cm x 0.2 cm  Wound WUJ:WJXBbed:pink and moist Drainage (amount, consistency, odor) Minimal serosanguinous  No odor Periwound:Bruising to arms and legs.  Dressing procedure/placement/frequency:Cleanse sacrum with NS and pat gently dry.  Apply calcium alginate to wound bed for absorption.  Cover with silicone border foam dressing.  Change alginate daily and foam dressing every three days and PRN soilage.  No disposable briefs in bed.   Cleanse skin tears to right elbow and any others on upper extremities with NS and pat gently dry. Approximate skin flap as possible.  Cover with silicone contact layer (Mepitel- in supply room) and cover with 2x2 gauze and kerlix/tape.  Change every other day.   Will not follow at this time.  Please re-consult if needed.  Maple HudsonKaren Kataryna Mcquilkin RN BSN [facility] Pager [phone]

## 2016-09-03 NOTE — Progress Notes (Signed)
[facility] -  at [facility]   PATIENT NAME: [name] [name]    MR#:  [id number]  DATE OF BIRTH:  1921/10/20  SUBJECTIVE:  CHIEF COMPLAINT:   Chief Complaint  Patient presents with  . Near Syncope   No further syncope in the hospital. Patient is more awake today. Daughter at bedside.  REVIEW OF SYSTEMS:    Review of Systems  Constitutional: Positive for malaise/fatigue. Negative for chills and fever.  HENT: Negative for sore throat.   Eyes: Negative for blurred vision, double vision and pain.  Respiratory: Positive for cough. Negative for hemoptysis, shortness of breath and wheezing.   Cardiovascular: Negative for chest pain, palpitations, orthopnea and leg swelling.  Gastrointestinal: Negative for abdominal pain, constipation, diarrhea, heartburn, nausea and vomiting.  Genitourinary: Negative for dysuria and hematuria.  Musculoskeletal: Negative for back pain and joint pain.  Skin: Negative for rash.  Neurological: Positive for weakness. Negative for sensory change, speech change, focal weakness and headaches.  Endo/Heme/Allergies: Does not bruise/bleed easily.  Psychiatric/Behavioral: Negative for depression. The patient is not nervous/anxious.     DRUG ALLERGIES:   Allergies  Allergen Reactions  . Penicillins Hives    Has patient had a PCN reaction causing immediate rash, facial/tongue/throat swelling, SOB or lightheadedness with hypotension: no Has patient had a PCN reaction causing severe rash involving mucus membranes or skin necrosis: no Has patient had a PCN reaction that required hospitalization no Has patient had a PCN reaction occurring within the last 10 years: no If all of the above answers are "NO", then may proceed with Cephalosporin use.   . Latex Rash  . Sulfonamide Derivatives Rash    VITALS:  Blood pressure (!) 113/57, pulse 69, temperature 98.4 F (36.9 C), temperature source Oral, resp. rate 18, weight 74.9 kg (165 lb 3.2 oz),  SpO2 94 %.  PHYSICAL EXAMINATION:   Physical Exam  GENERAL:  83 y.o.-year-old patient lying in the bed with no acute distress.  EYES: Pupils equal, round, reactive to light and accommodation. No scleral icterus. Extraocular muscles intact.  HEENT: Head atraumatic, normocephalic. Oropharynx and nasopharynx clear.  NECK:  Supple, no jugular venous distention. No thyroid enlargement, no tenderness.  LUNGS: Normal breath sounds bilaterally, no wheezing, rales, rhonchi. No use of accessory muscles of respiration.  CARDIOVASCULAR: S1, S2 normal. No murmurs, rubs, or gallops.  ABDOMEN: Soft, nontender, nondistended. Bowel sounds present. No organomegaly or mass.  EXTREMITIES: No cyanosis, clubbing or edema b/l.    NEUROLOGIC: Cranial nerves II through XII are intact.    PSYCHIATRIC: The patient is alert and Awake SKIN: No obvious rash, lesion, or ulcer.   LABORATORY PANEL:   CBC  Recent Labs Lab 09/03/16 0806  WBC 6.2  HGB 12.0*  HCT 36.0*  PLT 236   ------------------------------------------------------------------------------------------------------------------ Chemistries   Recent Labs Lab 09/02/16 1008 09/03/16 0806  NA 135 134*  K 4.5 4.6  CL 96* 97*  CO2 27 26  GLUCOSE 101* 101*  BUN 13 20  CREATININE 0.63 0.65  CALCIUM 9.1 8.6*  AST 37  --   ALT 25  --   ALKPHOS 59  --   BILITOT 1.2  --    ------------------------------------------------------------------------------------------------------------------  Cardiac Enzymes  Recent Labs Lab 09/03/16 0806  TROPONINI <0.03   ------------------------------------------------------------------------------------------------------------------  RADIOLOGY:  Ct Head Wo Contrast  Result Date: 09/02/2016 CLINICAL DATA:  Syncope. Patient was in ED earlier today and passed out in EMS going home. Patient had IV contrast earlier today. EXAM: CT HEAD  WITHOUT CONTRAST TECHNIQUE: Contiguous axial images were obtained from the  base of the skull through the vertex without intravenous contrast. COMPARISON:  PET-CT 08/19/2016 FINDINGS: Brain: No acute intracranial hemorrhage. No focal mass lesion. No CT evidence of acute infarction. No midline shift or mass effect. No hydrocephalus. Basilar cisterns are patent. Ventriculomegaly and cortical atrophy not changed. Vascular: No hyperdense vessel or unexpected calcification. Skull: Normal. Negative for fracture or focal lesion. Sinuses/Orbits: Paranasal sinuses and mastoid air cells are clear. Orbits are clear. Other: None. IMPRESSION: 1. No acute intracranial findings.  No change from prior. 2. Stable atrophy and ventriculomegaly. Electronically Signed   By: Genevive BiStewart  Edmunds M.D.   On: 09/02/2016 16:42   Ct Chest W Contrast  Result Date: 09/02/2016 CLINICAL DATA:  Syncopal episode.  Coughing. EXAM: CT CHEST WITH CONTRAST TECHNIQUE: Multidetector CT imaging of the chest was performed during intravenous contrast administration. CONTRAST:  75mL ISOVUE-300 IOPAMIDOL (ISOVUE-300) INJECTION 61% COMPARISON:  None. FINDINGS: Cardiovascular: The heart is normal in size. No pericardial effusion. There is marked tortuosity and calcification of the thoracic aorta but no focal aneurysm or dissection. The branch vessels are patent. Moderate atherosclerotic calcifications. Three-vessel coronary artery calcifications are noted along with surgical changes from bypass surgery. The pulmonary arteries appear normal. The Mediastinum/Nodes: Small scattered mediastinal and hilar lymph nodes but no mass or overt adenopathy. The esophagus is grossly normal. Lungs/Pleura: Changes of chronic pulmonary interstitial fibrosis. No acute overlying pulmonary infiltrates. Moderate lower lobe peribronchial thickening and areas of atelectasis possibly reflecting bronchitis. No pleural effusion. Upper Abdomen: No significant upper abdominal findings. There is fairly significant atrophy of the pancreas. Chest wall/  Musculoskeletal: No chest wall mass, supraclavicular or axillary adenopathy. The thyroid gland is grossly normal. No significant bony findings. IMPRESSION: 1. Advanced tortuosity and calcification of the thoracic aorta but no focal aneurysm or dissection. 2. Three-vessel coronary artery calcifications and surgical changes from bypass surgery. 3. Chronic interstitial lung disease and possible superimposed lower lobe bronchitis. No infiltrates or effusions. 4. No significant upper abdominal findings. Aortic Atherosclerosis (ICD10-I70.0). Electronically Signed   By: Rudie MeyerP.  Gallerani M.D.   On: 09/02/2016 11:56   Dg Chest Port 1 View  Result Date: 09/02/2016 CLINICAL DATA:  Shortness breath. EXAM: PORTABLE CHEST 1 VIEW COMPARISON:  08/19/2016 .  08/20/2015.  05/12/2013. FINDINGS: Prior CABG. Heart size normal. Mild infiltrate right upper lobe cannot be excluded. Bilateral pleural-parenchymal thickening consistent scarring . Low lung volumes. No pleural effusion. No pneumothorax. Interposition of the colon under the right hemidiaphragm. IMPRESSION: 1. Mild infiltrate right upper lobe cannot be excluded. 2. Bilateral pleural-parenchymal scarring. 3. Prior CABG.  Heart size normal. Electronically Signed   By: Maisie Fushomas  Register   On: 09/02/2016 10:40     ASSESSMENT AND PLAN:   Herschell Dimeslbert Downen  is a 83 y.o. male with a known history of CAD status post CABG, carotid stenosis, transient atrial fibrillation on eliquis, peripheral arterial disease, arthritis presents to hospital today secondary to recurrent syncopal episode  # Recurrent syncope -Echocardiogram done recently shows only minimal diastolic dysfunction. MRI of the brain was done 2 weeks ago for TIA showed significant ventriculomegaly-possible chronic normal pressure hydrocephalus. He doesn't have any cognitive deficits or urinary complaints and baseline.  Discussed with neurology Dr. Loretha BrasilZeylikman. Increased dose of Keppra today. Wait for EEG. -EEG ordered and  pending - Appreciate cardiology input. No further testing recommended. Patient may need cardiac monitor at discharge. -History of transient A. fib and on eliquis, currently on oral amiodarone and also  in normal sinus rhythm. - Continue telemetry monitoring  #2 CAD-stable at this time. Monitor on telemetry. Continue cardiac medications. 2 sets of troponins are negative.  #3 chronic bronchitis and chronic cough-no recent change in his cough symptoms recently according to daughter. No fevers or chills.  #4 DVT prophylaxis-already on eliquis  All the records are reviewed and case discussed with Care Management/Social Workerr. Management plans discussed with the patient, family and they are in agreement.  CODE STATUS: Partial  DVT Prophylaxis: SCDs  TOTAL TIME TAKING CARE OF THIS PATIENT: 30 minutes.   POSSIBLE D/C IN 1-2 DAYS, DEPENDING ON CLINICAL CONDITION.  Milagros Loll R M.D on 09/03/2016 at 12:02 PM  Between 7am to 6pm - Pager - [phone]  After 6pm go to [email or web address] - password [email or web address]  [facility]  Office  [phone]  CC: Primary care physician; Steele Sizer, MD  Note: This dictation was prepared with Dragon dictation along with smaller phrase technology. Any transcriptional errors that result from this process are unintentional.

## 2016-09-03 NOTE — Progress Notes (Signed)
SLP Cancellation Note  Patient Details Name: [name] [name] MRN: [id number] DOB: 11/15/1921   Cancelled treatment:       Reason Eval/Treat Not Completed: Patient declined, no reason specified (Pt's family member declined upon meeting her)  Daughter present in room when SLP arrived. She indicated an evaluation was not needed at this time as she felt pt was "at his baseline". ST services just evaluated pt last admission and recommended a Dysphagia level 3(mech soft) diet w/ NECTAR liquids d/t pharyngeal phase dysphagia and increased risk for aspiration. Of note, pt is on Nectar consistency liquids via "sippy cup" at home for ~1 year as his baseline per Daughter's report. The family is thickening his (thin) liquids using powder "we have as we need to". Daughter declined any modification of the foods in pt's diet; daughter declined offer to use a puree for swallowing Pills at this time.  NSG updated on interaction w/ Daughter and her denial of need for BSE or need to change pt's diet to a dysphagia diet as is his baseline per last admission/report by family. Recommended use of puree if any overt s/s of aspiration are noted when swallowing Pills(w/ water) to lessen risk for aspiration of both liquids and tablets. NSG agreed.  MD to reconsult ST services for any further services.     Jerilynn SomKatherine Alexzandra Bilton, MS, CCC-SLP Katori Wirsing 09/03/2016, 2:30 PM

## 2016-09-04 DIAGNOSIS — R55 Syncope and collapse: Secondary | ICD-10-CM | POA: Diagnosis not present

## 2016-09-04 MED ORDER — LEVETIRACETAM 750 MG PO TABS: 750.0000 mg | ORAL_TABLET | Freq: Every day | ORAL | 0 refills | Status: DC

## 2016-09-04 MED ORDER — AMIODARONE HCL 200 MG PO TABS: 100.0000 mg | ORAL_TABLET | Freq: Every day | ORAL | 0 refills | Status: DC

## 2016-09-04 MED ORDER — AMIODARONE HCL 200 MG PO TABS
100.0000 mg | ORAL_TABLET | Freq: Every day | ORAL | Status: DC
  Filled 2016-09-04: qty 1

## 2016-09-04 NOTE — Discharge Summary (Signed)
[facility] [place] [facility]   PATIENT NAME: [name] [name]    MR#:  [id number]  DATE OF BIRTH:  [birth year]  DATE OF ADMISSION:  09/02/2016 ADMITTING PHYSICIAN: Enid Baas, MD  DATE OF DISCHARGE: 09/04/2016  PRIMARY CARE PHYSICIAN: Steele Sizer, MD    ADMISSION DIAGNOSIS:  Syncope, unspecified syncope type [R55]  DISCHARGE DIAGNOSIS:  Recurrent syncope etiology unclear  SECONDARY DIAGNOSIS:   Past Medical History:  Diagnosis Date  . A-fib (HCC)   . Bradycardia    06/2010  . CAD (coronary artery disease)    nuclear 01/2008, no ischemia, EF 60%, distal anterior scar  . Carotid artery disease (HCC)    doppler 05/2009,  0-39% bilateral / doppler 06/2010 OK  . Edema    Ankle edema August, 2012  . Ejection fraction    EF 45%, echo,05/2009,apical hypo,  ?? posterior and lateral hypo  . Elevated CPK    11/23/08.Marland KitchenMarland Kitchenprobably from debris from popliteal aneurysm  . Fall at home July 2015  . Hx of CABG    1998  . Hyperlipidemia   . Intracranial hemorrhage (HCC)    Two small subependymal bleeds 06/2010,,ASA held  . PAD (peripheral artery disease) (HCC)    Above the knee to below the knee popliteal bypass,, right  //    Doppler,  July, 2013, patent  . Popliteal aneurysm (HCC)    .repair..11/25/2008  Dr. Darrick Penna  . Prominent abdominal aortic pulsation    doppler normal  . Speech problem    Neurologic event with brief infusion and speech difficulty.... January, 2011.... being assessed by neurology  February, 2011  . Syncope    .remote..no etiology  /  syncope 11/2008.. from acute leg pain  . TIA (transient ischemic attack)    Brief episode of confusion and speech difficulty, January, 2012  /  recurrent confusion speech difficulty September, 2012  . Venous insufficiency    support hose    HOSPITAL COURSE:  [name] [name] a 83 y.o. malewith a known history of CAD status post CABG, carotid stenosis, transient atrial fibrillation on eliquis,  peripheral arterial disease, arthritis presents to hospital today secondary to recurrent syncopal episode  # Recurrent syncope -Echocardiogram done recently shows only minimal diastolic dysfunction. MRI of the brain was done 2 weeks ago for TIA showed significant ventriculomegaly-possible chronic normal pressure hydrocephalus. He doesn't have any cognitive deficits or urinary complaints and baseline.  Discussed with neurology Dr. Loretha Brasil. Increased dose of Keppra today to 750 mg qhs .  -EEG negative -may require out pt prolong EEG monitoring---pt will f/u Dr Karel Jarvis ([facility]) - Appreciate cardiology input. No further testing recommended. -advised by Dr Cassie Freer to get Loop monitro as outpt. Dter will call Dr Milta Deiters office to get it set up  -History of transient A. fib and on eliquis, currently on oral amiodarone (decreased to 100 mg daily)  and also in normal sinus rhythm.   #2 CAD-stable at this time. -Continue cardiac medications. 2 sets of troponins are negative.  #3 chronic bronchitis and chronic cough-no recent change in his cough symptoms recently according to daughter. No fevers or chills.  #4 DVT prophylaxis-already on eliquis  Overall at baseline. Above d/w dter in the room at length and questions answered.   CONSULTS OBTAINED:  Treatment Team:  Marcina Millard, MD Pauletta Browns, MD  DRUG ALLERGIES:   Allergies  Allergen Reactions  . Penicillins Hives    Has patient had a PCN reaction causing immediate rash, facial/tongue/throat swelling,  SOB or lightheadedness with hypotension: no Has patient had a PCN reaction causing severe rash involving mucus membranes or skin necrosis: no Has patient had a PCN reaction that required hospitalization no Has patient had a PCN reaction occurring within the last 10 years: no If all of the above answers are "NO", then may proceed with Cephalosporin use.   . Latex Rash  . Sulfonamide Derivatives Rash     DISCHARGE MEDICATIONS:   Current Discharge Medication List    CONTINUE these medications which have CHANGED   Details  amiodarone (PACERONE) 200 MG tablet Take 0.5 tablets (100 mg total) by mouth daily. Qty: 30 tablet, Refills: 0    levETIRAcetam (KEPPRA) 750 MG tablet Take 1 tablet (750 mg total) by mouth at bedtime. Qty: 30 tablet, Refills: 0   Associated Diagnoses: Convulsions, unspecified convulsion type (HCC)      CONTINUE these medications which have NOT CHANGED   Details  apixaban (ELIQUIS) 5 MG TABS tablet Take 1 tablet (5 mg total) by mouth 2 (two) times daily. Qty: 180 tablet, Refills: 4    calcium carbonate (TUMS - DOSED IN MG ELEMENTAL CALCIUM) 500 MG chewable tablet Chew 2 tablets by mouth daily with supper.     docusate sodium (COLACE) 100 MG capsule Take 100 mg by mouth 2 (two) times daily.     fluticasone (FLONASE) 50 MCG/ACT nasal spray Place 1 spray into both nostrils daily. Refills: 2    Multiple Vitamin (MULTIVITAMIN WITH MINERALS) TABS tablet Take 1 tablet by mouth daily with breakfast.     polyethylene glycol (MIRALAX / GLYCOLAX) packet Take 17 g by mouth daily.    Vitamin D, Cholecalciferol, 1000 units TABS Take 2,000 tablets by mouth daily.     acetaminophen (TYLENOL) 325 MG tablet Take 2 tablets (650 mg total) by mouth every 6 (six) hours as needed for mild pain (or Fever >/= 101).    albuterol (PROVENTIL) (2.5 MG/3ML) 0.083% nebulizer solution Take 3 mLs (2.5 mg total) by nebulization every 6 (six) hours as needed for wheezing or shortness of breath. Qty: 150 mL, Refills: 6    benzonatate (TESSALON) 100 MG capsule TAKE ONE CAPSULE BY MOUTH EVERY 8 HOURS AS NEEDED COUGH Qty: 90 capsule, Refills: 0    guaiFENesin (MUCINEX) 600 MG 12 hr tablet Take 1 tablet (600 mg total) by mouth 2 (two) times daily.        If you experience worsening of your admission symptoms, develop shortness of breath, life threatening emergency, suicidal or homicidal  thoughts you must seek medical attention immediately by calling 911 or calling your MD immediately  if symptoms less severe.  You Must read complete instructions/literature along with all the possible adverse reactions/side effects for all the Medicines you take and that have been prescribed to you. Take any new Medicines after you have completely understood and accept all the possible adverse reactions/side effects.   Please note  You were cared for by a hospitalist during your hospital stay. If you have any questions about your discharge medications or the care you received while you were in the hospital after you are discharged, you can call the unit and asked to speak with the hospitalist on call if the hospitalist that took care of you is not available. Once you are discharged, your primary care physician will handle any further medical issues. Please note that NO REFILLS for any discharge medications will be authorized once you are discharged, as it is imperative that you return to your  primary care physician (or establish a relationship with a primary care physician if you do not have one) for your aftercare needs so that they can reassess your need for medications and monitor your lab values. Today   SUBJECTIVE   Doing well. Enjoying Breakfast detr in the room. No new episodes of syncope here  VITAL SIGNS:  Blood pressure (!) 97/50, pulse (!) 52, temperature (!) 97.4 F (36.3 C), temperature source Oral, resp. rate 20, weight 74.9 kg (165 lb 3.2 oz), SpO2 90 %.  I/O:   Intake/Output Summary (Last 24 hours) at 09/04/16 0951 Last data filed at 09/04/16 0445  Gross per 24 hour  Intake              480 ml  Output                0 ml  Net              480 ml    PHYSICAL EXAMINATION:  GENERAL:  83 y.o.-year-old patient lying in the bed with no acute distress.  EYES: Pupils equal, round, reactive to light and accommodation. No scleral icterus. Extraocular muscles intact.  HEENT: Head  atraumatic, normocephalic. Oropharynx and nasopharynx clear.  NECK:  Supple, no jugular venous distention. No thyroid enlargement, no tenderness.  LUNGS: Normal breath sounds bilaterally, no wheezing, rales,rhonchi or crepitation. No use of accessory muscles of respiration.  CARDIOVASCULAR: S1, S2 normal. No murmurs, rubs, or gallops.  ABDOMEN: Soft, non-tender, non-distended. Bowel sounds present. No organomegaly or mass.  EXTREMITIES: No pedal edema, cyanosis, or clubbing.  NEUROLOGIC: non focal PSYCHIATRIC: The patient is alert and awake.  SKIN: No obvious rash, lesion, or ulcer.   DATA REVIEW:   CBC   Recent Labs Lab 09/03/16 0806  WBC 6.2  HGB 12.0*  HCT 36.0*  PLT 236    Chemistries   Recent Labs Lab 09/02/16 1008 09/03/16 0806  NA 135 134*  K 4.5 4.6  CL 96* 97*  CO2 27 26  GLUCOSE 101* 101*  BUN 13 20  CREATININE 0.63 0.65  CALCIUM 9.1 8.6*  AST 37  --   ALT 25  --   ALKPHOS 59  --   BILITOT 1.2  --     Microbiology Results   No results found for this or any previous visit (from the past 240 hour(s)).  RADIOLOGY:  Ct Head Wo Contrast  Result Date: 09/02/2016 CLINICAL DATA:  Syncope. Patient was in ED earlier today and passed out in EMS going home. Patient had IV contrast earlier today. EXAM: CT HEAD WITHOUT CONTRAST TECHNIQUE: Contiguous axial images were obtained from the base of the skull through the vertex without intravenous contrast. COMPARISON:  PET-CT 08/19/2016 FINDINGS: Brain: No acute intracranial hemorrhage. No focal mass lesion. No CT evidence of acute infarction. No midline shift or mass effect. No hydrocephalus. Basilar cisterns are patent. Ventriculomegaly and cortical atrophy not changed. Vascular: No hyperdense vessel or unexpected calcification. Skull: Normal. Negative for fracture or focal lesion. Sinuses/Orbits: Paranasal sinuses and mastoid air cells are clear. Orbits are clear. Other: None. IMPRESSION: 1. No acute intracranial findings.   No change from prior. 2. Stable atrophy and ventriculomegaly. Electronically Signed   By: Genevive BiStewart  Edmunds M.D.   On: 09/02/2016 16:42   Ct Chest W Contrast  Result Date: 09/02/2016 CLINICAL DATA:  Syncopal episode.  Coughing. EXAM: CT CHEST WITH CONTRAST TECHNIQUE: Multidetector CT imaging of the chest was performed during intravenous contrast administration. CONTRAST:  75mL ISOVUE-300  IOPAMIDOL (ISOVUE-300) INJECTION 61% COMPARISON:  None. FINDINGS: Cardiovascular: The heart is normal in size. No pericardial effusion. There is marked tortuosity and calcification of the thoracic aorta but no focal aneurysm or dissection. The branch vessels are patent. Moderate atherosclerotic calcifications. Three-vessel coronary artery calcifications are noted along with surgical changes from bypass surgery. The pulmonary arteries appear normal. The Mediastinum/Nodes: Small scattered mediastinal and hilar lymph nodes but no mass or overt adenopathy. The esophagus is grossly normal. Lungs/Pleura: Changes of chronic pulmonary interstitial fibrosis. No acute overlying pulmonary infiltrates. Moderate lower lobe peribronchial thickening and areas of atelectasis possibly reflecting bronchitis. No pleural effusion. Upper Abdomen: No significant upper abdominal findings. There is fairly significant atrophy of the pancreas. Chest wall/ Musculoskeletal: No chest wall mass, supraclavicular or axillary adenopathy. The thyroid gland is grossly normal. No significant bony findings. IMPRESSION: 1. Advanced tortuosity and calcification of the thoracic aorta but no focal aneurysm or dissection. 2. Three-vessel coronary artery calcifications and surgical changes from bypass surgery. 3. Chronic interstitial lung disease and possible superimposed lower lobe bronchitis. No infiltrates or effusions. 4. No significant upper abdominal findings. Aortic Atherosclerosis (ICD10-I70.0). Electronically Signed   By: Rudie Meyer M.D.   On: 09/02/2016 11:56    Dg Chest Port 1 View  Result Date: 09/02/2016 CLINICAL DATA:  Shortness breath. EXAM: PORTABLE CHEST 1 VIEW COMPARISON:  08/19/2016 .  08/20/2015.  05/12/2013. FINDINGS: Prior CABG. Heart size normal. Mild infiltrate right upper lobe cannot be excluded. Bilateral pleural-parenchymal thickening consistent scarring . Low lung volumes. No pleural effusion. No pneumothorax. Interposition of the colon under the right hemidiaphragm. IMPRESSION: 1. Mild infiltrate right upper lobe cannot be excluded. 2. Bilateral pleural-parenchymal scarring. 3. Prior CABG.  Heart size normal. Electronically Signed   By: Maisie Fus  Register   On: 09/02/2016 10:40     Management plans discussed with the patient, family and they are in agreement.  CODE STATUS:     Code Status Orders        Start     Ordered   09/02/16 2134  Limited resuscitation (code)  Continuous    Question Answer Comment  In the event of cardiac or respiratory ARREST: Initiate Code Blue, Call Rapid Response Yes   In the event of cardiac or respiratory ARREST: Perform CPR Yes   In the event of cardiac or respiratory ARREST: Perform Intubation/Mechanical Ventilation No   In the event of cardiac or respiratory ARREST: Use NIPPV/BiPAp only if indicated Yes   In the event of cardiac or respiratory ARREST: Administer ACLS medications if indicated Yes   In the event of cardiac or respiratory ARREST: Perform Defibrillation or Cardioversion if indicated Yes      09/02/16 2133    Code Status History    Date Active Date Inactive Code Status Order ID Comments User Context   09/02/2016  7:32 PM 09/02/2016  9:33 PM DNR [id number]  Enid Baas, MD Inpatient   08/19/2016  6:07 PM 08/20/2016  8:41 PM Full Code [id number]  Ihor Austin, MD Inpatient   10/06/2015 12:31 AM 10/08/2015  7:28 PM Full Code [id number]  Oralia Manis, MD ED   08/17/2015  1:14 AM 08/17/2015  1:14 AM Full Code [id number]  Joella Prince, MD ED   08/17/2015  1:14 AM 08/22/2015  6:03 PM Full  Code [id number]  Joella Prince, MD ED    Advance Directive Documentation     Most Recent Value  Type of Advance Directive  Living will  Pre-existing out of facility DNR order (yellow  form or pink MOST form)  -  "MOST" Form in Place?  -      TOTAL TIME TAKING CARE OF THIS PATIENT: 40 minutes.    Doroteo Nickolson M.D on 09/04/2016 at 9:51 AM  Between 7am to 6pm - Pager - [phone] After 6pm go to [email or web address] - Social research officer, government  [facility]  Office  [phone]  CC: Primary care physician; Steele Sizer, MD

## 2016-09-04 NOTE — Consult Note (Signed)
Reason for Consult: syncope  Referring Physician: Dr. Nemiah Commander   CC: sycnope  HPI: [name] [name] is an 83 y.o. male with a known history of CAD status post CABG, carotid stenosis, transient atrial fibrillation on eliquis, peripheral arterial disease, arthritis presents to hospital today secondary to recurrent syncopal episode. As per son at bedside pt had about 3 of them yesterday and multiple in the past few months.   He was admitted to the hospital about 2 weeks ago for altered mental status and left leg weakness and was diagnosed to have TIA. Already on eliquis, so no further medication changes were made. At baseline patient is mostly wheelchair bound and has 24 x 7 caregivers but mentally sharp according to her daughter. Yesterday pt was sitting in his wheelchair and was noted to have his eyes rock rolled over and passed out for less than a minute. Pt had 3 similar episodes in past.    Back to baseline  Past Medical History:  Diagnosis Date  . A-fib (HCC)   . Bradycardia    06/2010  . CAD (coronary artery disease)    nuclear 01/2008, no ischemia, EF 60%, distal anterior scar  . Carotid artery disease (HCC)    doppler 05/2009,  0-39% bilateral / doppler 06/2010 OK  . Edema    Ankle edema August, 2012  . Ejection fraction    EF 45%, echo,05/2009,apical hypo,  ?? posterior and lateral hypo  . Elevated CPK    11/23/08.Marland KitchenMarland Kitchenprobably from debris from popliteal aneurysm  . Fall at home July 2015  . Hx of CABG    1998  . Hyperlipidemia   . Intracranial hemorrhage (HCC)    Two small subependymal bleeds 06/2010,,ASA held  . PAD (peripheral artery disease) (HCC)    Above the knee to below the knee popliteal bypass,, right  //    Doppler,  July, 2013, patent  . Popliteal aneurysm (HCC)    .repair..11/25/2008  Dr. Darrick Penna  . Prominent abdominal aortic pulsation    doppler normal  . Speech problem    Neurologic event with brief infusion and speech difficulty.... January, 2011.... being assessed by  neurology  February, 2011  . Syncope    .remote..no etiology  /  syncope 11/2008.. from acute leg pain  . TIA (transient ischemic attack)    Brief episode of confusion and speech difficulty, January, 2012  /  recurrent confusion speech difficulty September, 2012  . Venous insufficiency    support hose    Past Surgical History:  Procedure Laterality Date  . CORONARY ARTERY BYPASS GRAFT  1998  . popliteal bypass  with reversed greater saphenous vein from right leg.      Family History  Problem Relation Age of Onset  . Stroke Mother   . Diabetes Mother   . Diabetes Sister     Social History:  reports that he quit smoking about 70 years ago. His smoking use included Cigarettes. He quit after 20.00 years of use. He has never used smokeless tobacco. He reports that he does not drink alcohol or use drugs.  Allergies  Allergen Reactions  . Penicillins Hives    Has patient had a PCN reaction causing immediate rash, facial/tongue/throat swelling, SOB or lightheadedness with hypotension: no Has patient had a PCN reaction causing severe rash involving mucus membranes or skin necrosis: no Has patient had a PCN reaction that required hospitalization no Has patient had a PCN reaction occurring within the last 10 years: no If all of the  above answers are "NO", then may proceed with Cephalosporin use.   . Latex Rash  . Sulfonamide Derivatives Rash    Medications: I have reviewed the patient's current medications.  ROS: The patient   General ROS: negative for - chills, fatigue, fever, night sweats, weight gain or weight loss Psychological ROS: negative for - behavioral disorder, hallucinations, memory difficulties, mood swings or suicidal ideation Ophthalmic ROS: negative for - blurry vision, double vision, eye pain or loss of vision ENT ROS: negative for - epistaxis, nasal discharge, oral lesions, sore throat, tinnitus or vertigo Allergy and Immunology ROS: negative for - hives or  itchy/watery eyes Hematological and Lymphatic ROS: negative for - bleeding problems, bruising or swollen lymph nodes Endocrine ROS: negative for - galactorrhea, hair pattern changes, polydipsia/polyuria or temperature intolerance Respiratory ROS: negative for - cough, hemoptysis, shortness of breath or wheezing Cardiovascular ROS: negative for - chest pain, dyspnea on exertion, edema or irregular heartbeat Gastrointestinal ROS: negative for - abdominal pain, diarrhea, hematemesis, nausea/vomiting or stool incontinence Genito-Urinary ROS: negative for - dysuria, hematuria, incontinence or urinary frequency/urgency Musculoskeletal ROS: negative for - joint swelling or muscular weakness Neurological ROS: as noted in HPI Dermatological ROS: negative for rash and skin lesion changes  Physical Examination: Blood pressure (!) 97/50, pulse (!) 52, temperature (!) 97.4 F (36.3 C), temperature source Oral, resp. rate 20, weight 74.9 kg (165 lb 3.2 oz), SpO2 90 %.    Neurological Examination   Mental Status: Alert, oriented, thought content appropriate.  Speech dysarthric but that is his baseline.  Able to follow 3 step commands without difficulty. Cranial Nerves: II: Discs flat bilaterally; Visual fields grossly normal, pupils equal, round, reactive to light and accommodation III,IV, VI: ptosis not present, extra-ocular motions intact bilaterally V,VII: smile symmetric, facial light touch sensation normal bilaterally VIII: hearing normal bilaterally IX,X: gag reflex present XI: bilateral shoulder shrug XII: midline tongue extension Motor: Right : Upper extremity   4+/5    Left:     Upper extremity   4+/5  Lower extremity   3/5     Lower extremity   3/5 Tone and bulk:normal tone throughout; no atrophy noted Sensory: Pinprick and light touch intact throughout, bilaterally Deep Tendon Reflexes: 1+ and symmetric throughout Plantars: Right: downgoing   Left: downgoing Cerebellar: normal  finger-to-nose Gait: not tested      Laboratory Studies:   Basic Metabolic Panel:  Recent Labs Lab 09/02/16 1008 09/03/16 0806  NA 135 134*  K 4.5 4.6  CL 96* 97*  CO2 27 26  GLUCOSE 101* 101*  BUN 13 20  CREATININE 0.63 0.65  CALCIUM 9.1 8.6*    Liver Function Tests:  Recent Labs Lab 09/02/16 1008  AST 37  ALT 25  ALKPHOS 59  BILITOT 1.2  PROT 6.6  ALBUMIN 2.9*   No results for input(s): LIPASE, AMYLASE in the last 168 hours. No results for input(s): AMMONIA in the last 168 hours.  CBC:  Recent Labs Lab 09/02/16 1008 09/03/16 0806  WBC 10.4 6.2  NEUTROABS 6.8*  --   HGB 12.9* 12.0*  HCT 38.7* 36.0*  MCV 86.2 85.4  PLT 239 236    Cardiac Enzymes:  Recent Labs Lab 09/02/16 1008 09/02/16 1643 09/02/16 2213 09/03/16 0240 09/03/16 0806  TROPONINI <0.03 <0.03 <0.03 <0.03 <0.03    BNP: Invalid input(s): POCBNP  CBG: No results for input(s): GLUCAP in the last 168 hours.  Microbiology: Results for orders placed or performed in visit on 11/08/15  Microscopic Examination  Status: Abnormal   Collection Time: 11/09/15  8:08 AM  Result Value Ref Range Status   WBC, UA None seen 0 - 5 /hpf Final   RBC, UA 0-2 0 - 2 /hpf Final   Epithelial Cells (non renal) 0-10 0 - 10 /hpf Final   Crystals Present (A) N/A Final   Crystal Type Calcium Oxalate N/A Final   Mucus, UA Present Not Estab. Final   Bacteria, UA Few None seen/Few Final    Coagulation Studies: No results for input(s): LABPROT, INR in the last 72 hours.  Urinalysis:   Recent Labs Lab 09/02/16 1008  COLORURINE AMBER*  LABSPEC 1.019  PHURINE 5.0  GLUCOSEU 50*  HGBUR NEGATIVE  BILIRUBINUR NEGATIVE  KETONESUR 20*  PROTEINUR 30*  NITRITE NEGATIVE  LEUKOCYTESUR NEGATIVE    Lipid Panel:     Component Value Date/Time   CHOL 142 08/20/2016 0455   CHOL 128 12/20/2015 1455   TRIG 48 08/20/2016 0455   HDL 48 08/20/2016 0455   HDL 41 12/20/2015 1455   CHOLHDL 3.0 08/20/2016  0455   VLDL 10 08/20/2016 0455   LDLCALC 84 08/20/2016 0455   LDLCALC 65 12/20/2015 1455    HgbA1C:  Lab Results  Component Value Date   HGBA1C 5.3 08/17/2015    Urine Drug Screen:  No results found for: LABOPIA, COCAINSCRNUR, LABBENZ, AMPHETMU, THCU, LABBARB  Alcohol Level: No results for input(s): ETH in the last 168 hours.   Afib on EKG  Imaging: Ct Head Wo Contrast  Result Date: 09/02/2016 CLINICAL DATA:  Syncope. Patient was in ED earlier today and passed out in EMS going home. Patient had IV contrast earlier today. EXAM: CT HEAD WITHOUT CONTRAST TECHNIQUE: Contiguous axial images were obtained from the base of the skull through the vertex without intravenous contrast. COMPARISON:  PET-CT 08/19/2016 FINDINGS: Brain: No acute intracranial hemorrhage. No focal mass lesion. No CT evidence of acute infarction. No midline shift or mass effect. No hydrocephalus. Basilar cisterns are patent. Ventriculomegaly and cortical atrophy not changed. Vascular: No hyperdense vessel or unexpected calcification. Skull: Normal. Negative for fracture or focal lesion. Sinuses/Orbits: Paranasal sinuses and mastoid air cells are clear. Orbits are clear. Other: None. IMPRESSION: 1. No acute intracranial findings.  No change from prior. 2. Stable atrophy and ventriculomegaly. Electronically Signed   By: Genevive BiStewart  Edmunds M.D.   On: 09/02/2016 16:42   Ct Chest W Contrast  Result Date: 09/02/2016 CLINICAL DATA:  Syncopal episode.  Coughing. EXAM: CT CHEST WITH CONTRAST TECHNIQUE: Multidetector CT imaging of the chest was performed during intravenous contrast administration. CONTRAST:  75mL ISOVUE-300 IOPAMIDOL (ISOVUE-300) INJECTION 61% COMPARISON:  None. FINDINGS: Cardiovascular: The heart is normal in size. No pericardial effusion. There is marked tortuosity and calcification of the thoracic aorta but no focal aneurysm or dissection. The branch vessels are patent. Moderate atherosclerotic calcifications.  Three-vessel coronary artery calcifications are noted along with surgical changes from bypass surgery. The pulmonary arteries appear normal. The Mediastinum/Nodes: Small scattered mediastinal and hilar lymph nodes but no mass or overt adenopathy. The esophagus is grossly normal. Lungs/Pleura: Changes of chronic pulmonary interstitial fibrosis. No acute overlying pulmonary infiltrates. Moderate lower lobe peribronchial thickening and areas of atelectasis possibly reflecting bronchitis. No pleural effusion. Upper Abdomen: No significant upper abdominal findings. There is fairly significant atrophy of the pancreas. Chest wall/ Musculoskeletal: No chest wall mass, supraclavicular or axillary adenopathy. The thyroid gland is grossly normal. No significant bony findings. IMPRESSION: 1. Advanced tortuosity and calcification of the thoracic aorta but  no focal aneurysm or dissection. 2. Three-vessel coronary artery calcifications and surgical changes from bypass surgery. 3. Chronic interstitial lung disease and possible superimposed lower lobe bronchitis. No infiltrates or effusions. 4. No significant upper abdominal findings. Aortic Atherosclerosis (ICD10-I70.0). Electronically Signed   By: Rudie Meyer M.D.   On: 09/02/2016 11:56   Dg Chest Port 1 View  Result Date: 09/02/2016 CLINICAL DATA:  Shortness breath. EXAM: PORTABLE CHEST 1 VIEW COMPARISON:  08/19/2016 .  08/20/2015.  05/12/2013. FINDINGS: Prior CABG. Heart size normal. Mild infiltrate right upper lobe cannot be excluded. Bilateral pleural-parenchymal thickening consistent scarring . Low lung volumes. No pleural effusion. No pneumothorax. Interposition of the colon under the right hemidiaphragm. IMPRESSION: 1. Mild infiltrate right upper lobe cannot be excluded. 2. Bilateral pleural-parenchymal scarring. 3. Prior CABG.  Heart size normal. Electronically Signed   By: Maisie Fus  Register   On: 09/02/2016 10:40     Assessment/Plan:  83 y.o. male with a known  history of CAD status post CABG, carotid stenosis, transient atrial fibrillation on eliquis, peripheral arterial disease, arthritis presents to hospital today secondary to recurrent syncopal episode. As per son at bedside pt had about 3 of them yesterday and multiple in the past few months.   He was admitted to the hospital about 2 weeks ago for altered mental status and left leg weakness and was diagnosed to have TIA. Already on eliquis, so no further medication changes were made. At baseline patient is mostly wheelchair bound and has 24 x 7 caregivers but mentally sharp according to her daughter. Yesterday pt was sitting in his wheelchair and was noted to have his eyes rock rolled over and passed out for less than a minute. Pt had 3 similar episodes in past.     I have seen Mr. Carothers about a year ago with witnessed shaking episode and unresponsiveness.  MRI brain and EEG was done which were normal. I started pt on Keppra 500 BID.  Pt has been having similar type of syncope episodes in the past.   EEG no acute abnormality  - appreciate Cardiology evaluation - repeat EEG L sided slowing no seizures.   - Pt was on Keppra 250 during day and 750 at night  - d/c today - Neurology as out pt for prolonged EEG Kilah Drahos  09/04/2016, 9:21 AM

## 2016-09-04 NOTE — Progress Notes (Signed)
[facility] Cardiology  SUBJECTIVE: Patient not experiencing chest pain, shortness of breath or recurrent syncope   Vitals:   09/03/16 0807 09/03/16 1957 09/04/16 0405 09/04/16 0854  BP: (!) 113/57 (!) 116/54 (!) 106/50 (!) 97/50  Pulse: 69 63 (!) 57 (!) 52  Resp: 18 (!) 21 (!) 21 20  Temp: 98.4 F (36.9 C) 98.3 F (36.8 C) (!) 97 F (36.1 C) (!) 97.4 F (36.3 C)  TempSrc: Oral Oral Axillary Oral  SpO2: 94% 91% 97% 90%  Weight:         Intake/Output Summary (Last 24 hours) at 09/04/16 0901 Last data filed at 09/04/16 0445  Gross per 24 hour  Intake              480 ml  Output                0 ml  Net              480 ml      PHYSICAL EXAM  General: Well developed, well nourished, in no acute distress HEENT:  Normocephalic and atramatic Neck:  No JVD.  Lungs: Clear bilaterally to auscultation and percussion. Heart: HRRR . Normal S1 and S2 without gallops or murmurs.  Abdomen: Bowel sounds are positive, abdomen soft and non-tender  Msk:  Back normal, normal gait. Normal strength and tone for age. Extremities: No clubbing, cyanosis or edema.   Neuro: Alert and oriented X 3. Psych:  Good affect, responds appropriately   LABS: Basic Metabolic Panel:  Recent Labs  32/44/105/04/21 1008 09/03/16 0806  NA 135 134*  K 4.5 4.6  CL 96* 97*  CO2 27 26  GLUCOSE 101* 101*  BUN 13 20  CREATININE 0.63 0.65  CALCIUM 9.1 8.6*   Liver Function Tests:  Recent Labs  09/02/16 1008  AST 37  ALT 25  ALKPHOS 59  BILITOT 1.2  PROT 6.6  ALBUMIN 2.9*   No results for input(s): LIPASE, AMYLASE in the last 72 hours. CBC:  Recent Labs  09/02/16 1008 09/03/16 0806  WBC 10.4 6.2  NEUTROABS 6.8*  --   HGB 12.9* 12.0*  HCT 38.7* 36.0*  MCV 86.2 85.4  PLT 239 236   Cardiac Enzymes:  Recent Labs  09/02/16 2213 09/03/16 0240 09/03/16 0806  TROPONINI <0.03 <0.03 <0.03   BNP: Invalid input(s): POCBNP D-Dimer: No results for input(s): DDIMER in the last 72 hours. Hemoglobin  A1C: No results for input(s): HGBA1C in the last 72 hours. Fasting Lipid Panel: No results for input(s): CHOL, HDL, LDLCALC, TRIG, CHOLHDL, LDLDIRECT in the last 72 hours. Thyroid Function Tests:  Recent Labs  09/02/16 1643  TSH 2.537   Anemia Panel: No results for input(s): VITAMINB12, FOLATE, FERRITIN, TIBC, IRON, RETICCTPCT in the last 72 hours.  Ct Head Wo Contrast  Result Date: 09/02/2016 CLINICAL DATA:  Syncope. Patient was in ED earlier today and passed out in EMS going home. Patient had IV contrast earlier today. EXAM: CT HEAD WITHOUT CONTRAST TECHNIQUE: Contiguous axial images were obtained from the base of the skull through the vertex without intravenous contrast. COMPARISON:  PET-CT 08/19/2016 FINDINGS: Brain: No acute intracranial hemorrhage. No focal mass lesion. No CT evidence of acute infarction. No midline shift or mass effect. No hydrocephalus. Basilar cisterns are patent. Ventriculomegaly and cortical atrophy not changed. Vascular: No hyperdense vessel or unexpected calcification. Skull: Normal. Negative for fracture or focal lesion. Sinuses/Orbits: Paranasal sinuses and mastoid air cells are clear. Orbits are clear. Other: None. IMPRESSION:  1. No acute intracranial findings.  No change from prior. 2. Stable atrophy and ventriculomegaly. Electronically Signed   By: Genevive Bi M.D.   On: 09/02/2016 16:42   Ct Chest W Contrast  Result Date: 09/02/2016 CLINICAL DATA:  Syncopal episode.  Coughing. EXAM: CT CHEST WITH CONTRAST TECHNIQUE: Multidetector CT imaging of the chest was performed during intravenous contrast administration. CONTRAST:  75mL ISOVUE-300 IOPAMIDOL (ISOVUE-300) INJECTION 61% COMPARISON:  None. FINDINGS: Cardiovascular: The heart is normal in size. No pericardial effusion. There is marked tortuosity and calcification of the thoracic aorta but no focal aneurysm or dissection. The branch vessels are patent. Moderate atherosclerotic calcifications. Three-vessel  coronary artery calcifications are noted along with surgical changes from bypass surgery. The pulmonary arteries appear normal. The Mediastinum/Nodes: Small scattered mediastinal and hilar lymph nodes but no mass or overt adenopathy. The esophagus is grossly normal. Lungs/Pleura: Changes of chronic pulmonary interstitial fibrosis. No acute overlying pulmonary infiltrates. Moderate lower lobe peribronchial thickening and areas of atelectasis possibly reflecting bronchitis. No pleural effusion. Upper Abdomen: No significant upper abdominal findings. There is fairly significant atrophy of the pancreas. Chest wall/ Musculoskeletal: No chest wall mass, supraclavicular or axillary adenopathy. The thyroid gland is grossly normal. No significant bony findings. IMPRESSION: 1. Advanced tortuosity and calcification of the thoracic aorta but no focal aneurysm or dissection. 2. Three-vessel coronary artery calcifications and surgical changes from bypass surgery. 3. Chronic interstitial lung disease and possible superimposed lower lobe bronchitis. No infiltrates or effusions. 4. No significant upper abdominal findings. Aortic Atherosclerosis (ICD10-I70.0). Electronically Signed   By: Rudie Meyer M.D.   On: 09/02/2016 11:56   Dg Chest Port 1 View  Result Date: 09/02/2016 CLINICAL DATA:  Shortness breath. EXAM: PORTABLE CHEST 1 VIEW COMPARISON:  08/19/2016 .  08/20/2015.  05/12/2013. FINDINGS: Prior CABG. Heart size normal. Mild infiltrate right upper lobe cannot be excluded. Bilateral pleural-parenchymal thickening consistent scarring . Low lung volumes. No pleural effusion. No pneumothorax. Interposition of the colon under the right hemidiaphragm. IMPRESSION: 1. Mild infiltrate right upper lobe cannot be excluded. 2. Bilateral pleural-parenchymal scarring. 3. Prior CABG.  Heart size normal. Electronically Signed   By: Maisie Fus  Register   On: 09/02/2016 10:40     Echo LV ejection fraction 75% by 2-D echocardiogram  08/20/2016  TELEMETRY: Sinus bradycardia:  ASSESSMENT AND PLAN:  Active Problems:   Syncope    1. Syncope, unknown etiology, with normal left ventricular function, without definitive arrhythmic event, possible seizure disorder, followed by neurology, Keppra increased to 750 mg daily at bedtime 2. History of paroxysmal atrial fibrillation, on [place] request for stroke prevention, and amiodarone for rhythm control. 3. Sinus bradycardia, appears hemodynamically stable, on amiodarone for rhythm control of atrial fibrillation 4. CAD, status post CABG, currently without chest pain  Recommendations  1. Continue current medications 2. Decrease amiodarone 100 mg daily 3. 30 day loop monitor as outpatient. If unrevealing, may consider implantable loop monitor.  Signed off for now, please call if any questions   Marcina Millard, MD, PhD, [facility] 09/04/2016 9:01 AM

## 2016-09-04 NOTE — Care Management (Signed)
Case discussed with attending. Discharging today. Has 24 hour caregivers. No DC needs identified.

## 2016-09-04 NOTE — Progress Notes (Signed)
Being discharged to home with his daughter.  She will transport him via wheelchair and wheelchair van.  Right arm skin tear cleansed and redressed.  Recommended some type of padding to prevent re-injury to the area.

## 2016-09-04 NOTE — Care Management Obs Status (Signed)
MEDICARE OBSERVATION STATUS NOTIFICATION   Patient Details  Name: [name] [name] MRN: [id number] Date of Birth: 03/02/1922   Medicare Observation Status Notification Given:  Yes    Marily MemosLisa M Ladesha Pacini, RN 09/04/2016, 9:26 AM

## 2016-09-04 NOTE — Plan of Care (Signed)
Problem: Health Behavior/Discharge Planning: Goal: Ability to manage health-related needs will improve Outcome: Completed/Met Date Met: 09/04/16 Ready for discharge.  Daughter well versed in his care

## 2016-09-05 ENCOUNTER — Telehealth: Payer: Self-pay | Admitting: Neurology

## 2016-09-05 NOTE — Telephone Encounter (Signed)
PT's daughter called and said he was released from the hospital and the hospital suggested that he see Dr Karel JarvisAquino for a 24hr EEG and she would like a call back to discuss this because PT is not mobil to make several appointments

## 2016-09-06 ENCOUNTER — Telehealth: Payer: Self-pay | Admitting: Family Medicine

## 2016-09-06 MED ORDER — AZITHROMYCIN 250 MG PO TABS: ORAL_TABLET | ORAL | 0 refills | Status: DC

## 2016-09-06 NOTE — Addendum Note (Signed)
Addended by: Gabriel CirriWICKER, Aidah Forquer on: 09/06/2016 12:29 PM   Modules accepted: Orders

## 2016-09-06 NOTE — Telephone Encounter (Signed)
Ms [name] LandsbergRenee called and would like to [facility] and green mucous.

## 2016-09-06 NOTE — Telephone Encounter (Signed)
Spoke with pt's daughter, relaying message below.  I also let her know that our office is sympathetic to her situation so we will be talking with our office manager to see if she knows any way that we can do the consult and EEG application on the same day.  Daughter appreciated my call.

## 2016-09-06 NOTE — Telephone Encounter (Signed)
[facility]

## 2016-09-06 NOTE — Telephone Encounter (Signed)
Spoke with pt's daughter, Luster LandsbergRenee.  She states that pt was in the hospital for 2 day observation and released Wednesday, July 4.  She states that hospital advised her to have pt seen by Dr. Karel JarvisAquino within the next week and that pt also needs 24 hour EEG.  Luster LandsbergRenee is requesting these 2 appointments to be combined as daughter will need to rent wheelchair accessible [facility]ena[facility] Niecevan to transport pt.  I explained that I have no control scheduling EEG's but that I would send message to Dr. Karel JarvisAquino and Ala BentSusan Reid ([facility] EEG Tech) to see if our office can accommodate.  Please advise

## 2016-09-06 NOTE — Telephone Encounter (Signed)
Routing to provider  

## 2016-09-09 NOTE — Telephone Encounter (Signed)
Spoke with pt's daughter, Luster LandsbergRenee, letting her know that I have scheduled pt to come in on July 25 at 1:00pm.  Also relayed that if Dr. Karel JarvisAquino decide's to do an EEG that it should be able to be applied the same day.  Luster LandsbergRenee was appreciative of my call.

## 2016-09-09 NOTE — Telephone Encounter (Signed)
We can do eval and EEG on both days, per Teddi. Looks like I have a new pt opening on July 25 1pm, depending on evaluation, pls let daughter know we may or may not do the EEG, but will have an opening for him if needed Darl Pikes(Susan, looks like this should be okay for a 24-hour, right?)

## 2016-09-16 ENCOUNTER — Telehealth: Payer: Self-pay | Admitting: Family Medicine

## 2016-09-16 NOTE — Telephone Encounter (Signed)
Renee would like to speak with Dr Dossie Arbourrissman regarding some issues such as DNR and other issues.  She would like to meet with Dr Dossie Arbourrissman on 7/25 or 7/26   [phone] Renee  Thank you

## 2016-09-17 NOTE — Telephone Encounter (Signed)
Scheduled to speak to provider on Thursday morning.

## 2016-09-19 ENCOUNTER — Ambulatory Visit (INDEPENDENT_AMBULATORY_CARE_PROVIDER_SITE_OTHER): Payer: Medicare Other | Admitting: Family Medicine

## 2016-09-19 ENCOUNTER — Encounter: Payer: Self-pay | Admitting: Family Medicine

## 2016-09-19 VITALS — BP 112/75 | HR 80 | Temp 97.7°F

## 2016-09-19 DIAGNOSIS — R05 Cough: Secondary | ICD-10-CM | POA: Diagnosis not present

## 2016-09-19 DIAGNOSIS — R053 Chronic cough: Secondary | ICD-10-CM

## 2016-09-19 MED ORDER — DOXYCYCLINE HYCLATE 100 MG PO TABS: 100.0000 mg | ORAL_TABLET | Freq: Two times a day (BID) | ORAL | 0 refills | Status: DC

## 2016-09-19 NOTE — Patient Instructions (Signed)
Mucinex, humidifier, nebulizer treatments, tessalon as needed

## 2016-09-19 NOTE — Progress Notes (Signed)
   BP 112/75   Pulse 80   Temp 97.7 F (36.5 C)   SpO2 97%    Subjective:    Patient ID: [name] [name], male    DOB: 03/13/1921, 83 y.o.   MRN: [id number]  HPI: [name] Palmerlbert L Cottier is a 83 y.o. male  Chief Complaint  Patient presents with  . Cough    pt's daughter states that the patient has a cough and has been wheezing. Dauhgter states that he usually always has this but has felt worse in the last 3 to 4 days.    Patient presents today with daughter and caregiver, who provide all of the hx. They states that his chronic cough is becoming worse and he's having some rattling in his chest when laying down the past few days. Notes that his energy levels have dropped the past few days also. Seems some better today. Denies fever, chills, respiratory distress. Never started the tussionex, and trying to stay away from the tessalon because it causes him to be "out of it". Still doing nebulizer treatments several times per day with some relief. Recent CT chest during hospitalization with no major acute changes. Completed a zpack a little over a week ago for similar sxs.   Relevant past medical, surgical, family and social history reviewed and updated as indicated. Interim medical history since our last visit reviewed. Allergies and medications reviewed and updated.  Review of Systems  Constitutional: Negative.   HENT: Negative.   Respiratory: Positive for cough and wheezing.   Cardiovascular: Negative.   Gastrointestinal: Negative.   Genitourinary: Negative.   Neurological: Negative.    Per HPI unless specifically indicated above     Objective:    BP 112/75   Pulse 80   Temp 97.7 F (36.5 C)   SpO2 97%   Wt Readings from Last 3 Encounters:  09/02/16 165 lb 3.2 oz (74.9 kg)  09/02/16 172 lb (78 kg)  08/19/16 172 lb 9.6 oz (78.3 kg)    Physical Exam  Constitutional: He appears well-developed and well-nourished. No distress.  HENT:  Head: Atraumatic.  Eyes: Conjunctivae are normal. No  scleral icterus.  Neck: Neck supple.  Cardiovascular: Normal rate.   Pulmonary/Chest: Effort normal and breath sounds normal. No respiratory distress. He has no wheezes. He has no rales.  Musculoskeletal:  Wheelchair bound  Lymphadenopathy:    He has no cervical adenopathy.  Neurological: He is alert.  Skin: Skin is warm and dry.  Psychiatric:  Appears slightly confused but at baseline  Nursing note and vitals reviewed.     Assessment & Plan:   Problem List Items Addressed This Visit    None    Visit Diagnoses    Chronic cough    -  Primary   Will start doxycycline, continue mucinex BID, nebulizer treatments, humidifier, vapor rubs. F/u if worsening or no improvement       Follow up plan: Return if symptoms worsen or fail to improve.

## 2016-09-25 ENCOUNTER — Ambulatory Visit (INDEPENDENT_AMBULATORY_CARE_PROVIDER_SITE_OTHER): Payer: Medicare Other | Admitting: Neurology

## 2016-09-25 ENCOUNTER — Encounter: Payer: Self-pay | Admitting: Neurology

## 2016-09-25 VITALS — BP 98/60 | HR 72 | Ht 71.0 in | Wt 180.0 lb

## 2016-09-25 DIAGNOSIS — R55 Syncope and collapse: Secondary | ICD-10-CM | POA: Diagnosis not present

## 2016-09-25 MED ORDER — LEVETIRACETAM 500 MG PO TABS: ORAL_TABLET | ORAL | 11 refills | Status: DC

## 2016-09-25 NOTE — Progress Notes (Signed)
NEUROLOGY FOLLOW UP OFFICE NOTE  [name] [name] [id number] 08/23/1921  HISTORY OF PRESENT ILLNESS: I had the pleasure of seeing [name] [name] Mirando in follow-up in the neurology clinic on 09/25/2016.  The patient was last seen 8 months ago after an episode of unresponsiveness with body shaking after a bowel movement in August 2017. Etiology unclear, convulsive syncope versus seizure, he was started on Keppra in the ER. He was having drowsiness on the medication and family reduced dose to 250mg  in AM, 500mg  in PM. He was back in the ER at the beginning of the month for recurrent syncopal episodes. His caregivers witnessed the first episode while sitting, he appeared to stop breathing for less than a minute and slumped down. The second episode with EMS appeared similar, then he had another one, no shaking, eyes were rolled back. There was note that EMS witnessed him start to cough violently and heart rate dropped from the 70s to high 30s, and BP dropped.  After coming home from the ER, as transport was moving him, he slid down the wheelchair and was out for a split second. He was brought [facility] where he was evaluated by Cardiology and Neurology. EEG showed posterior background slowing, no epileptiform discharges. He had an MRI 2 weeks prior when he presented with altered mental status and left leg weakness. I personally reviewed MRI brain which did not show any acute changes. There was severe ventriculomegaly with disproportionate sulcal effacement at the convexities, concerning for severe chronic normal pressure hydrocephalus. There were  confluent white matter changes consistent with moderate to severe chronic microvascular disease.Due to concern for seizures, Keppra dose was increased to 250mg  in AM, 750mg  in PM. His daughter reports that he seems to be doing fine on the dose adjustment, he does not seem as drowsy. He is drowsy in the office today but she states it is his naptime and it is very difficult to get  him transported. They have not seen any further syncopal episodes since the beginning of the month. He has a holter monitor currently. He has 24/7 caregivers at home, no staring/unresponsive episodes witnessed.   HPI 01/15/2016: This is a pleasant 83 yo RH man who had an episode of loss of consciousness with shaking last 10/05/15. He has no recollection of the event, his daughter provides the details but was not present during the event. She reports that he had a bowel movement and as they were moving him back to the bed with hoyer lift, he had what appeared to be a seizure. Her sister reported that they sat him down, he started to lean over and backwards, then started having body shaking with eyes rolled back. He recovered fast after they lay him down, he was pale and "kind of out of it" briefly. No associated tongue bite or urinary incontinence. They report he has had episodes of his blood pressure dropping, and had been at the rehab recently when he passed out in the toilet having a bowel movement and diagnosed with vasovagal event.   [facility] June 2017 for confusion and gazing up forward to the ceiling. His daughter states that his eyes would not track, as if he could not see or recognize things. He had an MRI brain with and without contrast, which did not show any acute changes. There was ventricular prominence without acute ventriculomegaly, patchy periventricular FLAIR abnormalities not significantly changed from 2012, regressed hemosiderin in the right lateral ventricle since the 2012 comparison,  intracranial MRA negative. He had a stroke workup with carotid dopplers showing less than 50% stenosis on the left, widely patent right ICA. Echo showed EF 45-50%, hypokinesis of the anterior myocardium, left atrium mildly dilated. He had an EEG which showed mild diffuse slowing from drowsiness, could not rule out general cerebral disturbance. During his stay, he had an episode of paroxysmal  atrial fibrillation with RVR and started on amiodarone and Eliquis. He was noted to have visual deficits in the left eye in the temporal field and was advised to see ophtho. His daughter reports ophtho visit showed normal vision. She states that it took 2-3 days before he started seeing simple images such as the chair in front of him. He was discharged to rehab where he had the vasovagal event while having a BM, then 1-2 days later when he was back home with 24/7 care, the event noted above with convulsive activity occurred. Head CT did not show any acute changes. EEG was normal. He was discharged home on Keppra 500mg  BID. His daughter denies any further syncopal episodes since then. She has noticed he is drowsy on the Keppra.   He had a normal birth and early development.  There is no history of febrile convulsions, CNS infections such as meningitis/encephalitis, significant traumatic brain injury, neurosurgical procedures, or family history of seizures.  PAST MEDICAL HISTORY: Past Medical History:  Diagnosis Date  . A-fib (HCC)   . Bradycardia    06/2010  . CAD (coronary artery disease)    nuclear 01/2008, no ischemia, EF 60%, distal anterior scar  . Carotid artery disease (HCC)    doppler 05/2009,  0-39% bilateral / doppler 06/2010 [place]  . Edema    Ankle edema August, 2012  . Ejection fraction    EF 45%, echo,05/2009,apical hypo,  ?? posterior and lateral hypo  . Elevated CPK    11/23/08.Marland Kitchen.Marland Kitchen.probably from debris from popliteal aneurysm  . Fall at home July 2015  . Hx of CABG    1998  . Hyperlipidemia   . Intracranial hemorrhage (HCC)    Two small subependymal bleeds 06/2010,,ASA held  . PAD (peripheral artery disease) (HCC)    Above the knee to below the knee popliteal bypass,, right  //    Doppler,  July, 2013, patent  . Popliteal aneurysm (HCC)    .repair..11/25/2008  Dr. Darrick PennaFields  . Prominent abdominal aortic pulsation    doppler normal  . Speech problem    Neurologic event with brief  infusion and speech difficulty.... January, 2011.... being assessed by neurology  February, 2011  . Syncope    .remote..no etiology  /  syncope 11/2008.. from acute leg pain  . TIA (transient ischemic attack)    Brief episode of confusion and speech difficulty, January, 2012  /  recurrent confusion speech difficulty September, 2012  . Venous insufficiency    support hose    MEDICATIONS: Current Outpatient Prescriptions on File Prior to Visit  Medication Sig Dispense Refill  . acetaminophen (TYLENOL) 325 MG tablet Take 2 tablets (650 mg total) by mouth every 6 (six) hours as needed for mild pain (or Fever >/= 101).    Marland Kitchen. albuterol (PROVENTIL) (2.5 MG/3ML) 0.083% nebulizer solution Take 3 mLs (2.5 mg total) by nebulization every 6 (six) hours as needed for wheezing or shortness of breath. 150 mL 6  . amiodarone (PACERONE) 200 MG tablet Take 0.5 tablets (100 mg total) by mouth daily. 30 tablet 0  . apixaban (ELIQUIS) 5 MG TABS tablet Take  1 tablet (5 mg total) by mouth 2 (two) times daily. 180 tablet 4  . benzonatate (TESSALON) 100 MG capsule TAKE ONE CAPSULE BY MOUTH EVERY 8 HOURS AS NEEDED COUGH 90 capsule 0  . calcium carbonate (TUMS - DOSED IN MG ELEMENTAL CALCIUM) 500 MG chewable tablet Chew 2 tablets by mouth daily with supper.     . docusate sodium (COLACE) 100 MG capsule Take 100 mg by mouth 2 (two) times daily.     Marland Kitchen doxycycline (VIBRA-TABS) 100 MG tablet Take 1 tablet (100 mg total) by mouth 2 (two) times daily. 20 tablet 0  . fluticasone (FLONASE) 50 MCG/ACT nasal spray Place 1 spray into both nostrils daily.  2  . guaiFENesin (MUCINEX) 600 MG 12 hr tablet Take 1 tablet (600 mg total) by mouth 2 (two) times daily.    Marland Kitchen levETIRAcetam (KEPPRA) 500 MG tablet Take 250 mg by mouth. Daily in the morning along with 750 mg daily at bedtime  3  . levETIRAcetam (KEPPRA) 750 MG tablet Take 1 tablet (750 mg total) by mouth at bedtime. 30 tablet 0  . Multiple Vitamin (MULTIVITAMIN WITH MINERALS)  TABS tablet Take 1 tablet by mouth daily with breakfast.     . polyethylene glycol (MIRALAX / GLYCOLAX) packet Take 17 g by mouth daily.    . Vitamin D, Cholecalciferol, 1000 units TABS Take 2,000 tablets by mouth daily.      No current facility-administered medications on file prior to visit.     ALLERGIES: Allergies  Allergen Reactions  . Penicillins Hives    Has patient had a PCN reaction causing immediate rash, facial/tongue/throat swelling, SOB or lightheadedness with hypotension: no Has patient had a PCN reaction causing severe rash involving mucus membranes or skin necrosis: no Has patient had a PCN reaction that required hospitalization no Has patient had a PCN reaction occurring within the last 10 years: no If all of the above answers are "NO", then may proceed with Cephalosporin use.   . Latex Rash  . Sulfonamide Derivatives Rash    FAMILY HISTORY: Family History  Problem Relation Age of Onset  . Stroke Mother   . Diabetes Mother   . Diabetes Sister     SOCIAL HISTORY: Social History   Social History  . Marital status: Married    Spouse name: N/A  . Number of children: N/A  . Years of education: N/A   Occupational History  . retired    Social History Main Topics  . Smoking status: Former Smoker    Years: 20.00    Types: Cigarettes    Quit date: 09/12/1946  . Smokeless tobacco: Never Used     Comment: quit appox 50 yrs ago  . Alcohol use No  . Drug use: No  . Sexual activity: Not on file   Other Topics Concern  . Not on file   Social History Narrative  . No narrative on file    REVIEW OF SYSTEMS difficult to obtain, patient is very hard of hearing and drowsy  PHYSICAL EXAM: Vitals:   09/25/16 1312  BP: 98/60  Pulse: 72   General: No acute distress, sitting slumped on wheelchair, drowsy but arousable, hard of hearing Head:  Normocephalic/atraumatic Neck: supple, no paraspinal tenderness, full range of motion Heart:  Regular rate and  rhythm Lungs:  Clear to auscultation bilaterally Back: No paraspinal tenderness Skin/Extremities: No rash, no edema Neurological Exam: drowsy, arousable to follow simple commands, Cranial nerves: Pupils equal, round, reactive to light. Extraocular movements  intact with no nystagmus. Visual fields full. Facial sensation intact. No facial asymmetry. Tongue, uvula, palate midline.  Motor: Bulk and tone normal, muscle strength at least 4-/5 on both UE, at least 3/5 on both LE. No pronator drift.  Sensation to light touch intact.  No extinction to double simultaneous stimulation.  Deep tendon reflexes +1 throughout.  Finger to nose testing intact.  Gait not tested, sitting on wheelchair with both feet in pads.  IMPRESSION: This is a pleasant 83 yo RH man with a history of hyperlipidemia, CAD s/p CABG, atrial fibrillation now on Eliquis, presenting after recent hospitalization for recurrent syncope. With one of the episodes witnessed by EMS, heart rate was noted to go from 70s to 30s, with drop in BP. He had previously been seen in our office after an episode of syncope with shaking, more suggestive of convulsive syncope rather than seizure. In the hospital, Keppra dose was increased to 250mg  in AM, 750mg  in PM, which he appears to be tolerating better with less drowsiness. I discussed differential diagnosis with daughter today, he has a cardiac monitor, would also check BP and HR if he has another event. I do not think doing an ambulatory EEG would be high yield at this time, as it would not necessarily change our management, would continue on higher dose of Keppra for now, but potentially taper off in the future if we find other reasons for the syncopal events. His daughter is in agreement, it is very difficult to transport him for an ambulatory EEG. We discussed close clinical monitoring, he will follow-up in 6 months and she knows to call for any changes.   Thank you for allowing me to participate in his  care.  Please do not hesitate to call for any questions or concerns.  The duration of this appointment visit was 25 minutes of face-to-face time with the patient.  Greater than 50% of this time was spent in counseling, explanation of diagnosis, planning of further management, and coordination of care.   Patrcia Dolly, M.D.   CC: Dr. Dossie Arbour, Dr. Adrian Blackwater, [phone]

## 2016-09-25 NOTE — Patient Instructions (Signed)
1. Continue Keppra 500mg : Take 1/2 tablet in AM, 1.5 tablet in PM 2. If another episode occurs, try to get him lying down, check BP and heart rate 3. Follow-up in 6 months, call for any changes

## 2016-09-26 ENCOUNTER — Ambulatory Visit (INDEPENDENT_AMBULATORY_CARE_PROVIDER_SITE_OTHER): Payer: Medicare Other | Admitting: Family Medicine

## 2016-09-26 ENCOUNTER — Encounter: Payer: Self-pay | Admitting: Family Medicine

## 2016-09-26 DIAGNOSIS — R569 Unspecified convulsions: Secondary | ICD-10-CM | POA: Diagnosis not present

## 2016-09-26 DIAGNOSIS — R05 Cough: Secondary | ICD-10-CM | POA: Diagnosis not present

## 2016-09-26 DIAGNOSIS — R053 Chronic cough: Secondary | ICD-10-CM

## 2016-09-26 NOTE — Progress Notes (Signed)
Discussion with daughter Patient requesting something less than a DO NOT RESUSCITATE form for parents. Reviewed and filled out the MOST form. See copy for details also will need to arrange home health for mother for wound care stiffness from Parkinson's and immobility.

## 2016-10-03 ENCOUNTER — Telehealth: Payer: Self-pay | Admitting: Family Medicine

## 2016-10-03 MED ORDER — DOXYCYCLINE HYCLATE 100 MG PO TABS: 100.0000 mg | ORAL_TABLET | Freq: Two times a day (BID) | ORAL | 0 refills | Status: DC

## 2016-10-03 NOTE — Telephone Encounter (Signed)
Patient was transferred to provider for telephone conversation.   

## 2016-10-03 NOTE — Telephone Encounter (Signed)
Phone call Discussed with patient's daughter doxycycline helped for a few weeks and then is patient started same symptoms again will call in another round of doxycycline.

## 2016-10-14 IMAGING — MR MR HEAD W/ CM
4 series · 22 of 48 positions shown · IV contrast (agent unspecified)
Comparison: MR brain without contrast 08/17/2015.

CONTRAST:  20mL MULTIHANCE GADOBENATE DIMEGLUMINE 529 MG/ML IV SOLN

CLINICAL DATA: Acute encephalopathy. Patient not acting RIGHT,
lethargic.

EXAM:
MRI HEAD WITH CONTRAST
TECHNIQUE: Multiplanar, multiecho pulse sequences of the brain and surrounding
structures were obtained with intravenous contrast.

[Series 2: T1 post-contrast · sagittal · 5.0mm · 0.45mm/px · 8 of 29 slices shown (1 of 3)]
[im 1/29]
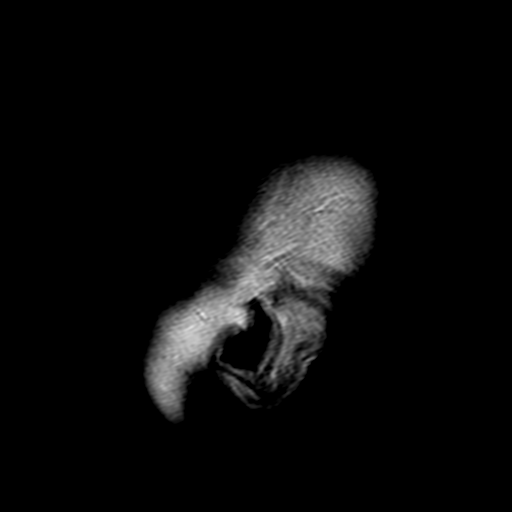
[im 5/29]
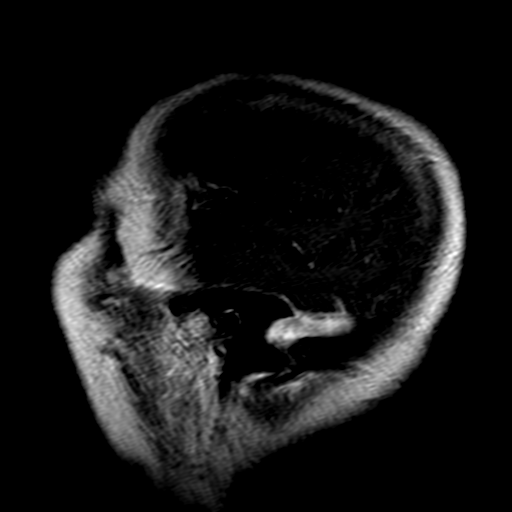
[im 9/29]
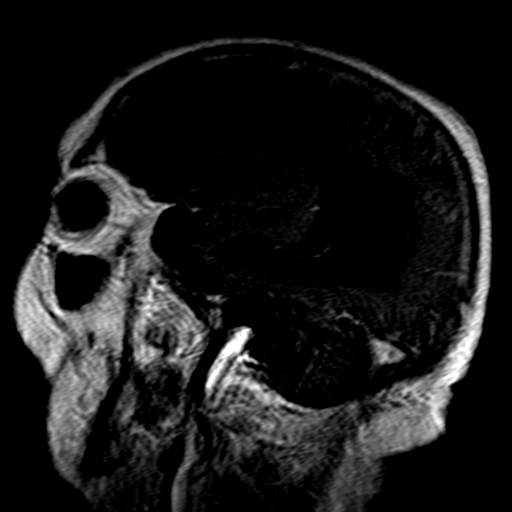
[im 13/29]
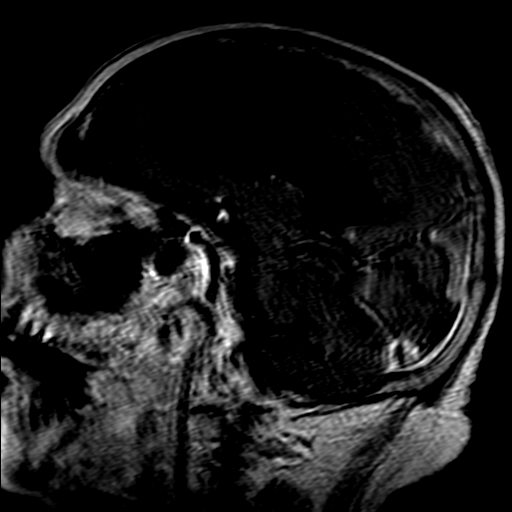
[im 17/29]
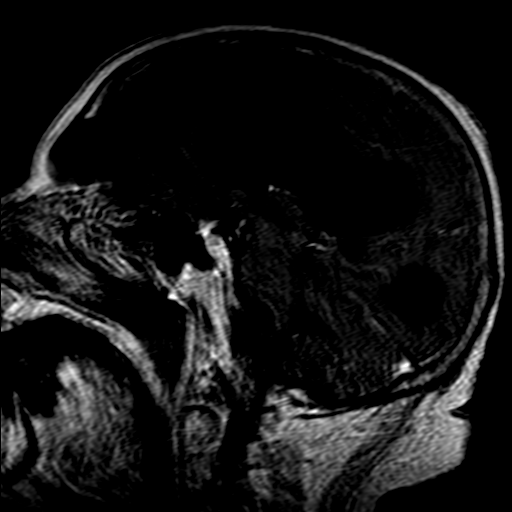
[im 21/29]
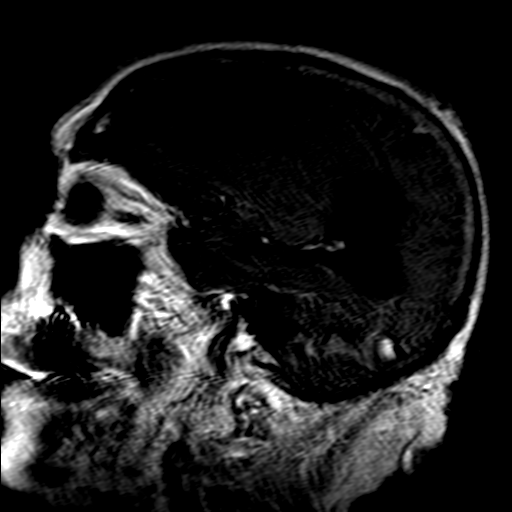
[im 25/29]
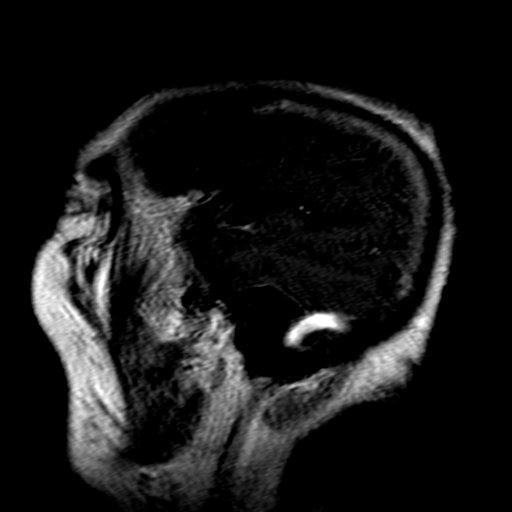
[im 29/29]
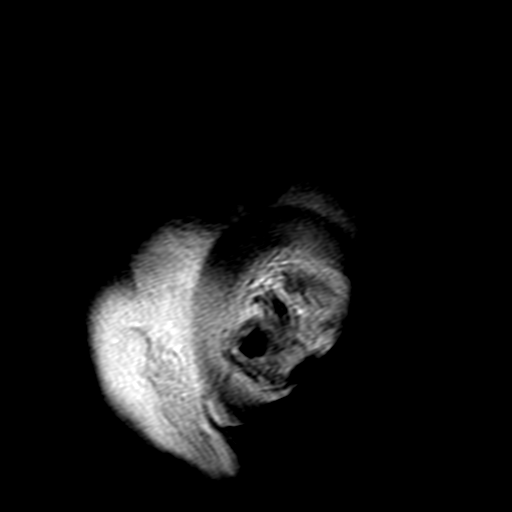

[Series 3: T1 post-contrast · axial · 3.0mm · 0.45mm/px · z∈[+8,+132]mm · 8 of 64 slices shown (2 of 3)]
[im 4/64]
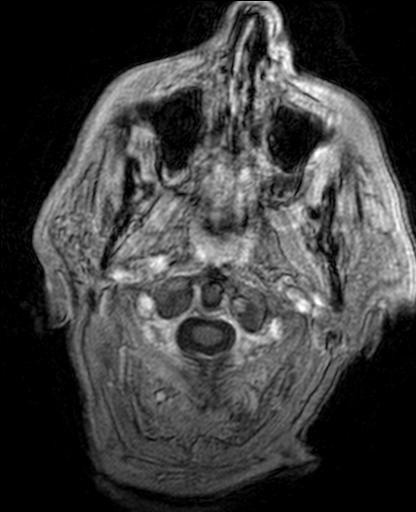
[im 10/64]
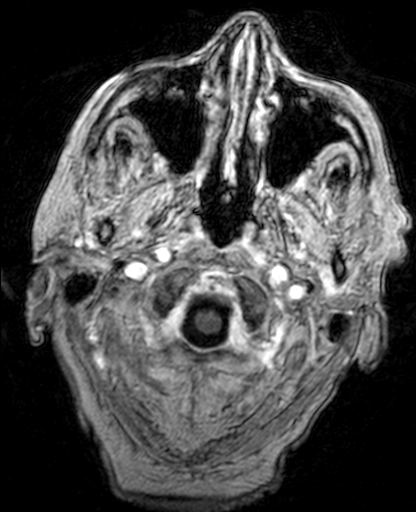
[im 20/64]
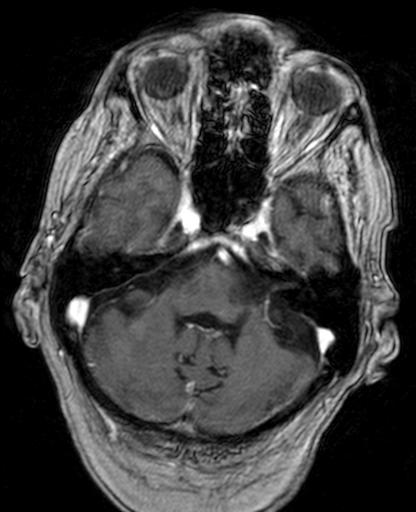
[im 27/64]
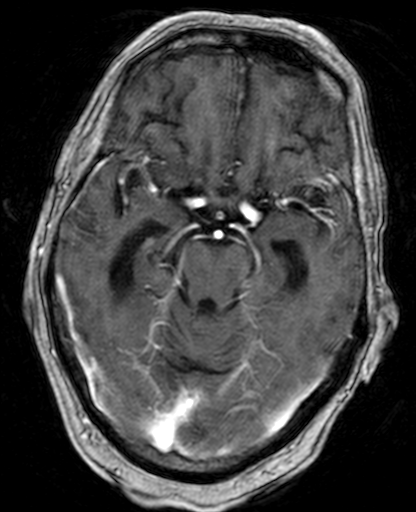
[im 34/64]
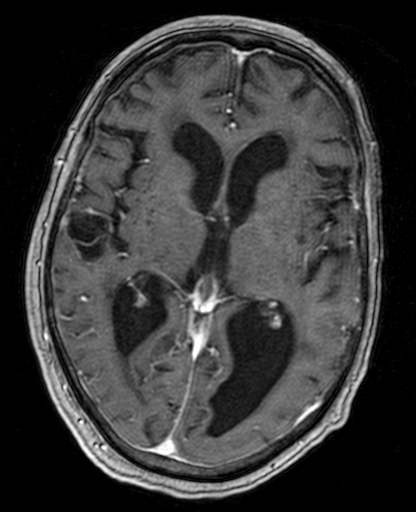
[im 37/64]
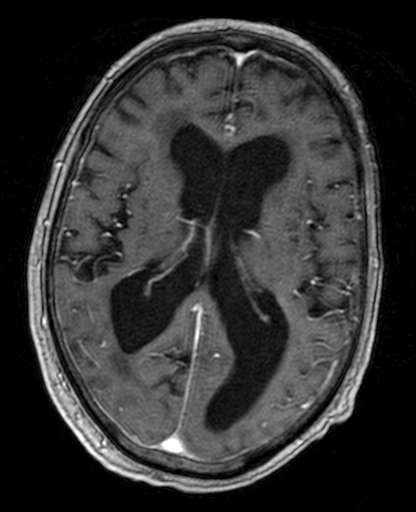
[im 44/64]
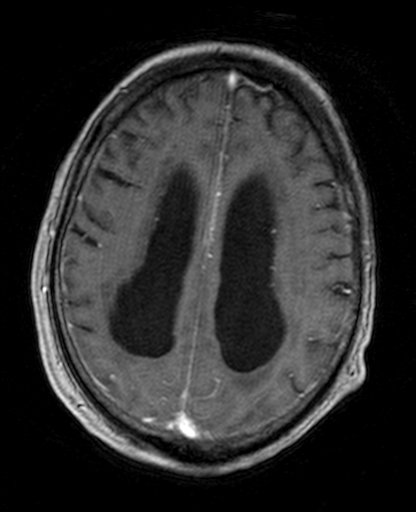
[im 54/64]
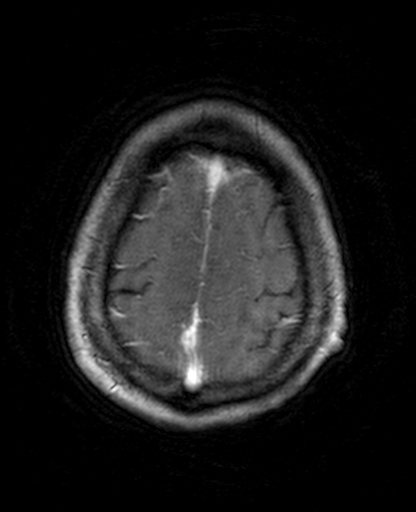

[Series 4: T1 post-contrast · coronal · 5.0mm · 0.45mm/px · 3 of 35 slices shown (3 of 3)]
[im 4/35]
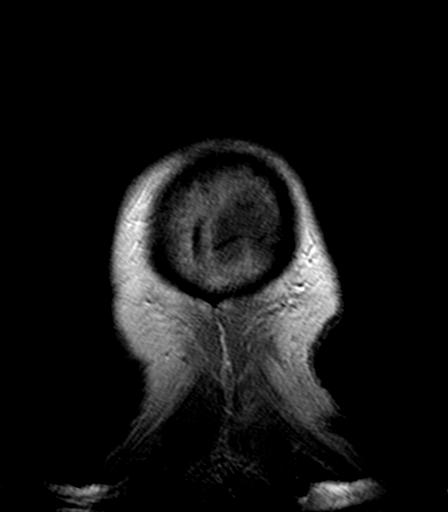
[im 18/35]
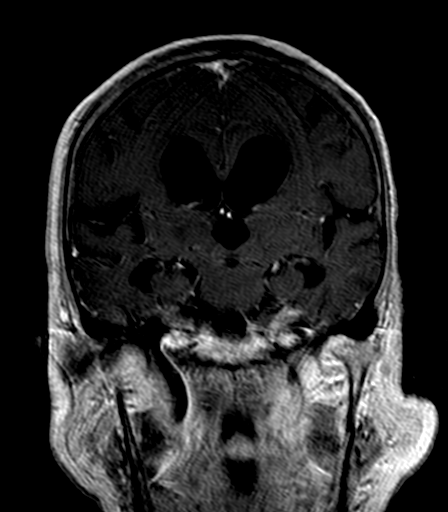
[im 31/35]
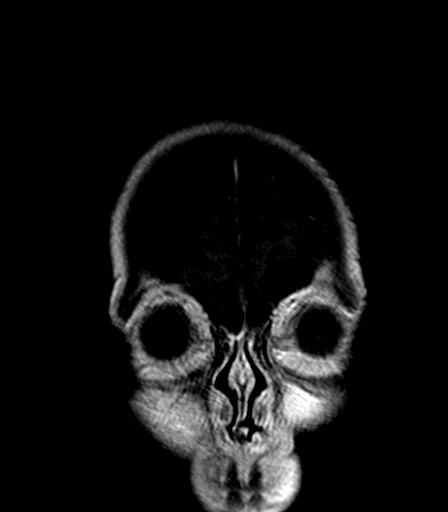

[Series 5: t1_se_axial post · axial · 5.0mm · 0.45mm/px · z∈[+19,+132]mm · 3 of 29 slices shown]
[im 4/29]
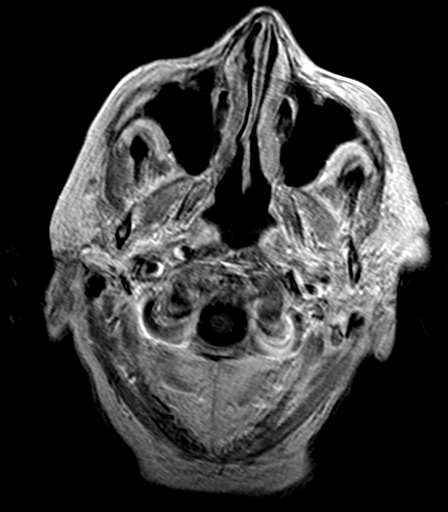
[im 15/29]
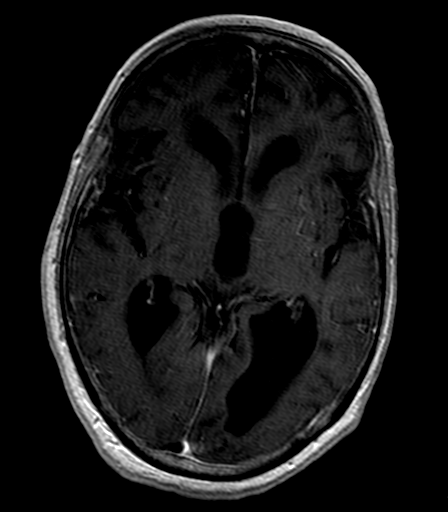
[im 25/29]
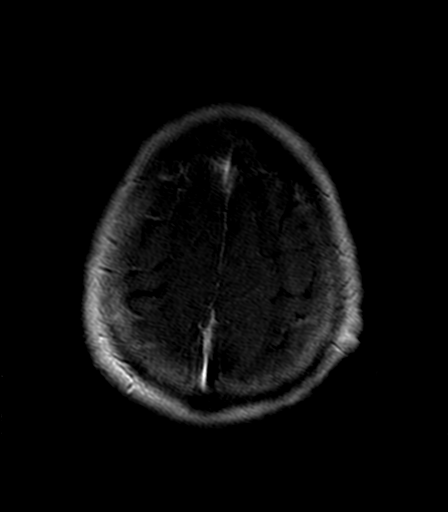

[22 of 48 positions shown; findings below may reference images not displayed]

FINDINGS: No abnormal enhancement of the brain or meninges. Major dural venous
sinuses are patent.

Atrophy and small vessel disease, better evaluated on prior MR.

No concerning features when correlated with yesterday's MR.
IMPRESSION: No acute abnormalities are seen on post infusion imaging.

## 2016-10-17 IMAGING — CR DG CHEST 2V
1 series · 2 of 2 positions shown · non-contrast
Comparison: 08/20/2015 and earlier.

CLINICAL DATA: [AGE] male with possible stroke. Atrial
fibrillation. Initial encounter.

EXAM:
CHEST  2 VIEW

[Series 1: dg chest 2 view · 0.14mm/px · 2 of 2 slices shown]
[im 1/2]
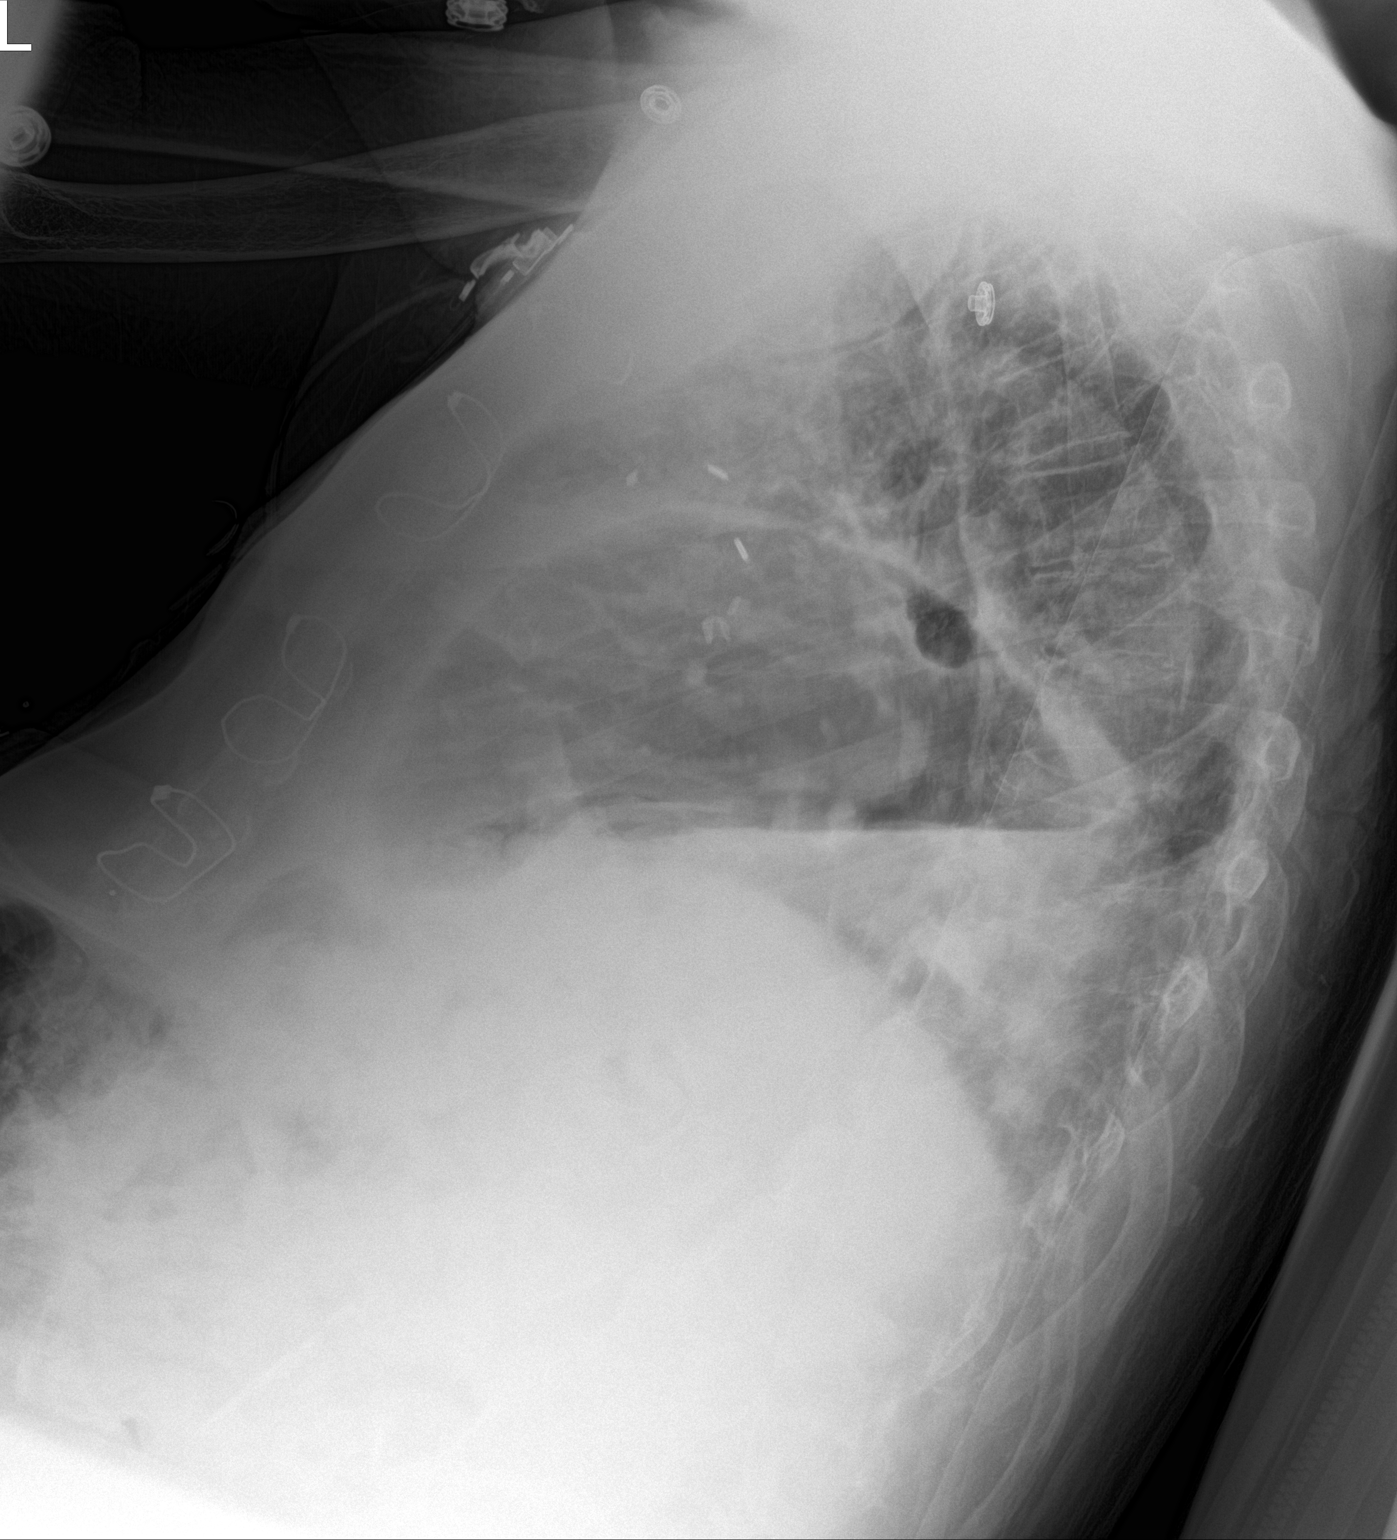
[im 2/2]
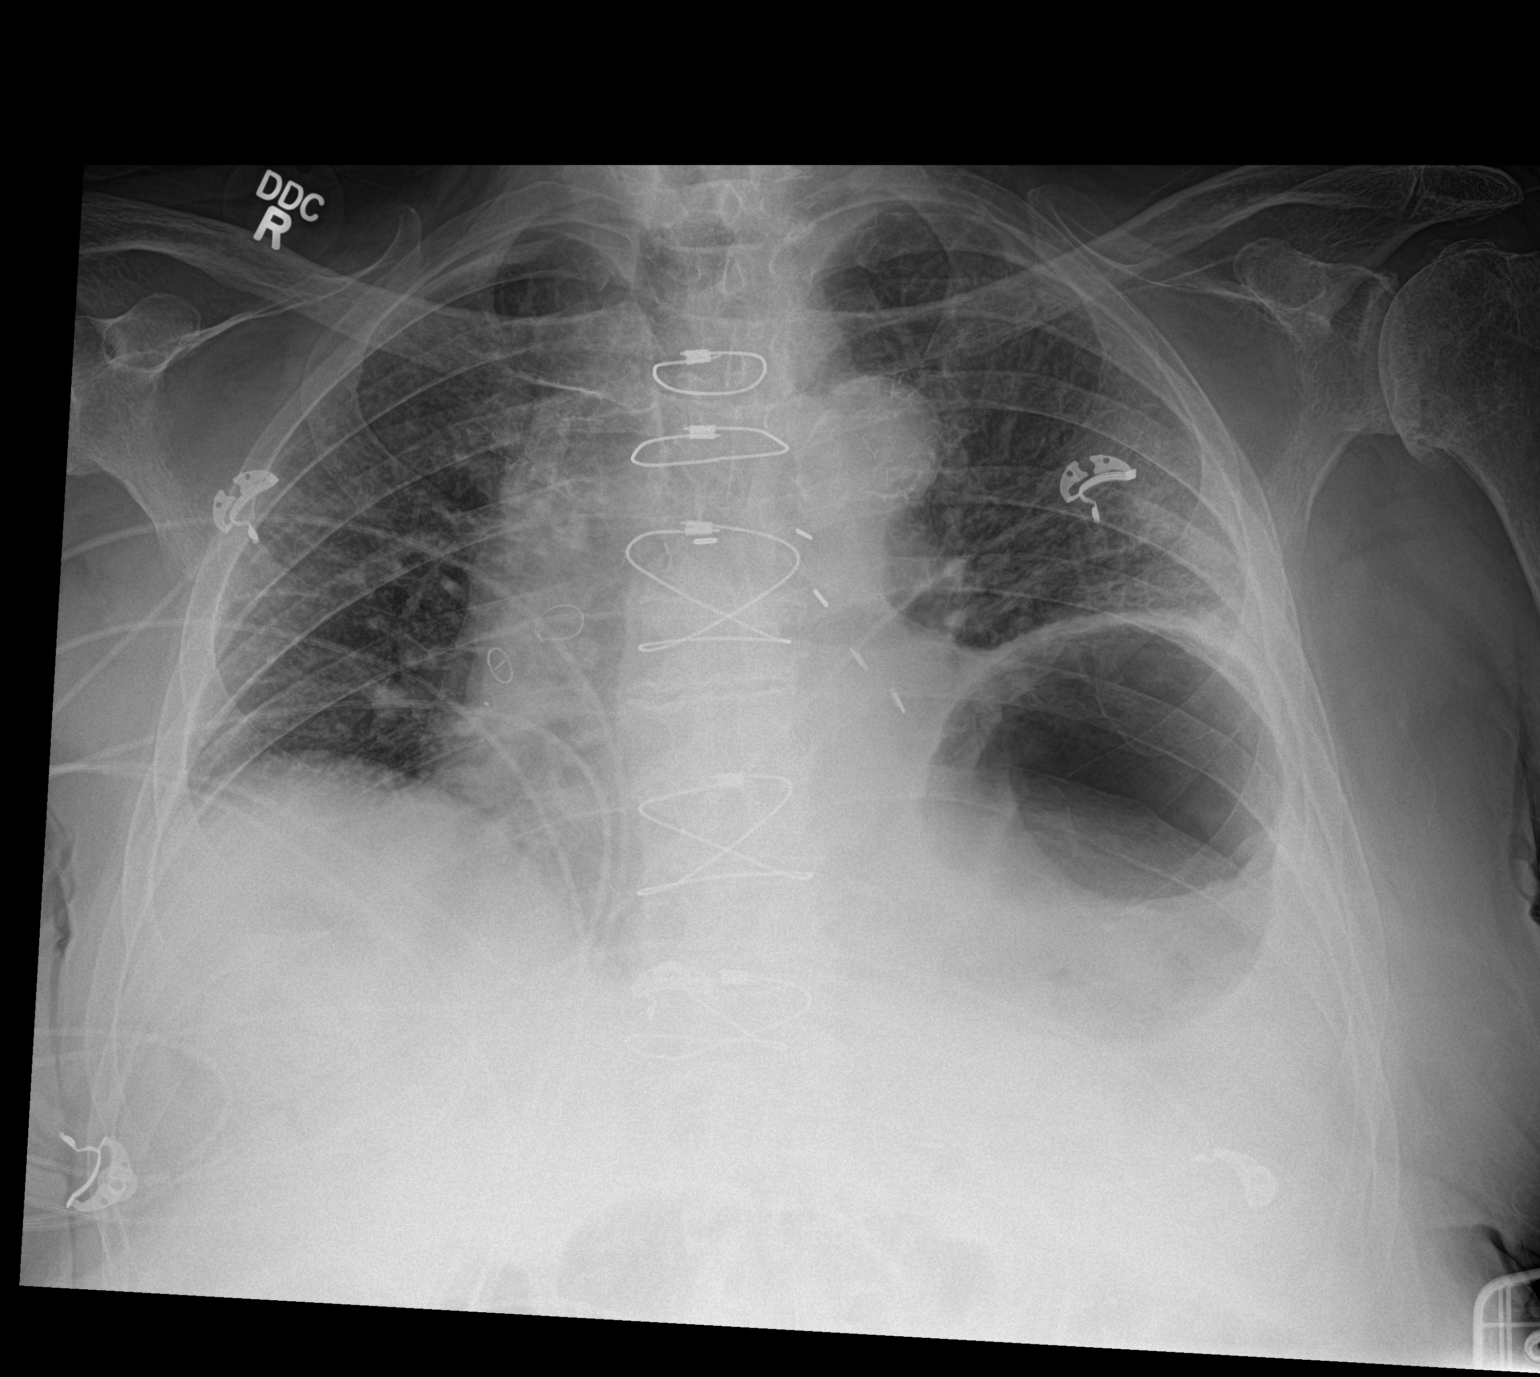

[2 of 2 positions shown; findings below may reference images not displayed]

FINDINGS: Seated AP and lateral views of the chest. Continued moderate
elevation of the left hemidiaphragm with abundant subjacent bowel
gas. Bowel gas in the right abdomen has regressed since 08/16/2015.
Stable cardiomegaly and mediastinal contours. Sequelae of CABG. No
pneumothorax, consolidation, or definite pleural effusion. Pulmonary
vascularity is increased. Stable visualized osseous structures.
Calcified aortic atherosclerosis.
IMPRESSION: Increased pulmonary vascularity since 08/16/2015, consider mild or
developing interstitial edema. No definite pleural effusion or other
new cardiopulmonary abnormality.

## 2016-11-13 ENCOUNTER — Telehealth: Payer: Self-pay | Admitting: Family Medicine

## 2016-11-13 MED ORDER — ALBUTEROL SULFATE HFA 108 (90 BASE) MCG/ACT IN AERS: 2.0000 | INHALATION_SPRAY | Freq: Four times a day (QID) | RESPIRATORY_TRACT | 2 refills | Status: DC | PRN

## 2016-11-13 NOTE — Telephone Encounter (Signed)
Patient's daughter would like to [facility] [place] for patient for this weekend in case their power goes out. Patient will not be able to use the nebulizer if power is out.   Please Advise.  Thank you

## 2016-11-13 NOTE — Telephone Encounter (Signed)
done

## 2016-11-13 NOTE — Telephone Encounter (Signed)
Voicemail left for daughter.  

## 2016-11-13 NOTE — Telephone Encounter (Signed)
Please advise 

## 2016-11-28 ENCOUNTER — Telehealth: Payer: Self-pay | Admitting: Family Medicine

## 2016-11-28 NOTE — Telephone Encounter (Signed)
Please advise 

## 2016-11-28 NOTE — Telephone Encounter (Signed)
Yes,  Will try next week,  Will call

## 2016-11-28 NOTE — Telephone Encounter (Signed)
Spoke to daughter and she wanted to know if Dr. Dossie Arbour could do a home visit for her father & mother to administer the flu shots. She would like to discuss first b/c of their medical condition it is hard to get them in for an appt, they do have caregivers in and out of the house and there is a risk of the flu. Would like advice. Mother's MRN is  [id number]

## 2016-12-31 ENCOUNTER — Other Ambulatory Visit: Payer: Self-pay | Admitting: Family Medicine

## 2016-12-31 MED ORDER — ALBUTEROL SULFATE (2.5 MG/3ML) 0.083% IN NEBU: 2.5000 mg | INHALATION_SOLUTION | Freq: Four times a day (QID) | RESPIRATORY_TRACT | 6 refills | Status: DC | PRN

## 2016-12-31 NOTE — Telephone Encounter (Signed)
Patient's duaghter requesting refill on prescription for nebulizer solution under medical diagnosis code for medicare part B to cover medication.   [facility] [place] [facility] of [facility] sent over with medical diagnosis code.  Please Advise.  Thank you

## 2017-01-01 ENCOUNTER — Telehealth: Payer: Self-pay

## 2017-01-01 MED ORDER — AZITHROMYCIN 250 MG PO TABS: ORAL_TABLET | ORAL | 0 refills | Status: DC

## 2017-01-01 NOTE — Telephone Encounter (Signed)
Daughter called with concerns of a chest rattling sounds. Would like to know if antibiotic can be sent to pharmacy.

## 2017-01-01 NOTE — Telephone Encounter (Signed)
done

## 2017-01-06 ENCOUNTER — Other Ambulatory Visit: Payer: Self-pay | Admitting: Family Medicine

## 2017-01-06 NOTE — Telephone Encounter (Signed)
Copied from CRM [id number]. Topic: Quick Communication - See Telephone Encounter >> Jan 06, 2017  1:13 PM Everardo PacificMoton, Lavar Rosenzweig, [place] wrote: CRM for notification. See Telephone encounter for:   01/06/17. Patient daughter calling because patient is about to be out of his Albuterol and pharmacy needs a ICD10 number to refill the medication

## 2017-01-06 NOTE — Telephone Encounter (Signed)
Pt.'s daughter called requesting ICD 10 CODE for his [facility]. The one sent earlier was not sufficient. She states he will be out tomorrow.

## 2017-01-06 NOTE — Telephone Encounter (Signed)
Please advise. I do not see anything on problem list for albuterol. Please advise.

## 2017-01-07 MED ORDER — ALBUTEROL SULFATE HFA 108 (90 BASE) MCG/ACT IN AERS: 2.0000 | INHALATION_SPRAY | Freq: Four times a day (QID) | RESPIRATORY_TRACT | 2 refills | Status: AC | PRN

## 2017-01-07 MED ORDER — ALBUTEROL SULFATE (2.5 MG/3ML) 0.083% IN NEBU: 2.5000 mg | INHALATION_SOLUTION | Freq: Four times a day (QID) | RESPIRATORY_TRACT | 6 refills | Status: DC | PRN

## 2017-01-07 NOTE — Telephone Encounter (Signed)
Rx sent with ICD10 code included.

## 2017-01-13 ENCOUNTER — Telehealth: Payer: Self-pay | Admitting: Family Medicine

## 2017-01-13 MED ORDER — CIPROFLOXACIN HCL 250 MG PO TABS: 250.0000 mg | ORAL_TABLET | Freq: Two times a day (BID) | ORAL | 0 refills | Status: DC

## 2017-01-13 NOTE — Telephone Encounter (Signed)
LVM for daughter to return phone call. 

## 2017-01-13 NOTE — Telephone Encounter (Signed)
Routing to provider  

## 2017-01-13 NOTE — Telephone Encounter (Signed)
Copied from CRM [id number]. Topic: Quick Communication - See Telephone Encounter >> Jan 13, 2017 10:29 AM Waymon AmatoBurton, Donna F wrote: CRM for notification. See Telephone encounter for: pt daughter is calling to see if patient can get something called in for a possible UTI -he is basically unable to come in -she states that he is showing signs of being confused, strong smell, and cloudy urine   Best number [phone]  01/13/17.

## 2017-01-13 NOTE — Telephone Encounter (Signed)
Please review

## 2017-01-13 NOTE — Telephone Encounter (Signed)
[facility]

## 2017-02-03 ENCOUNTER — Telehealth: Payer: Self-pay | Admitting: Family Medicine

## 2017-02-03 ENCOUNTER — Other Ambulatory Visit: Payer: Self-pay | Admitting: Family Medicine

## 2017-02-03 MED ORDER — AZITHROMYCIN 250 MG PO TABS: ORAL_TABLET | ORAL | 0 refills | Status: DC

## 2017-02-03 NOTE — Telephone Encounter (Signed)
Copied from CRM [phone]#[id number]. Topic: General - Other >> Feb 03, 2017  8:59 AM Cecelia ByarsGreen, Temeka L, RMA wrote: Reason for CRM: patient is requesting a prescription for an [facility] a crackling in chest pt has hx of chronic bronchitis

## 2017-03-11 ENCOUNTER — Encounter: Payer: Self-pay | Admitting: Family Medicine

## 2017-03-31 ENCOUNTER — Ambulatory Visit: Payer: Medicare Other | Admitting: Neurology

## 2017-04-17 ENCOUNTER — Telehealth: Payer: Self-pay | Admitting: Family Medicine

## 2017-04-17 NOTE — Telephone Encounter (Signed)
Copied from CRM [phone]#[id number]. Topic: Inquiry >> Apr 17, 2017 11:58 AM Raquel SarnaHayes, Teresa G wrote: Daughter called for pt having a chronic bronchitis flair up/infection.  Daughter says Dr. Dossie Arbourrissman will call in Rx to help pt due to his mobility issues. Pt is needing Rx to clear up the cb infection.    [facility] [id number] [facility] [place] [place] [facility] [address] [place] [id number] Phone: [phone] Fax: [phone]

## 2017-04-17 NOTE — Telephone Encounter (Signed)
Daughter reports pt. Is having one of "his flare-ups with his bronchitis." Reports he coughing up thick yellow mucus. No fever. Requests an antibiotic be called in.

## 2017-04-18 ENCOUNTER — Telehealth: Payer: Self-pay

## 2017-04-18 MED ORDER — DOXYCYCLINE HYCLATE 100 MG PO TABS: 100.0000 mg | ORAL_TABLET | Freq: Two times a day (BID) | ORAL | 0 refills | Status: DC

## 2017-04-18 MED ORDER — AZITHROMYCIN 250 MG PO TABS: ORAL_TABLET | ORAL | 0 refills | Status: DC

## 2017-04-18 NOTE — Telephone Encounter (Signed)
[facility] sent [facility]

## 2017-04-18 NOTE — Telephone Encounter (Signed)
It depends on the UTI. I'll send over Doxy, which should cover both a bit better.

## 2017-04-18 NOTE — Telephone Encounter (Signed)
Patient's daughter notified.

## 2017-04-18 NOTE — Addendum Note (Signed)
Addended by: Vonita MossRISSMAN, Jilleen Essner A on: 04/18/2017 08:23 AM   Modules accepted: Orders

## 2017-04-18 NOTE — Telephone Encounter (Signed)
Spoke with patient;s daughter, she is concerned that he may have a UTI also, Dr.Crissman sent over an antibiotic for the respiratory symptoms, is there something that will cover both of these issues.

## 2017-04-23 ENCOUNTER — Telehealth: Payer: Self-pay | Admitting: Family Medicine

## 2017-04-23 NOTE — Telephone Encounter (Signed)
These requests are all okay.

## 2017-04-23 NOTE — Telephone Encounter (Signed)
Patients daughter Luster LandsbergRenee would like for Dr Dossie Arbourrissman to put in for [facility] to come evaluate patient, per daughter patient is starting to have pressure sores and he is unable to come to the office for evaluation.  [facility] is coming out to see the patients wife so that is who they are requesting.  She is also requesting some type of air mattress or pad for his bed to.  Renee [phone]  Thanks

## 2017-04-23 NOTE — Telephone Encounter (Signed)
I will add him to the schedule, just let me know the day you plan on going out.

## 2017-04-23 NOTE — Telephone Encounter (Signed)
Home health will be denied w/o a face to face visit.

## 2017-04-23 NOTE — Telephone Encounter (Signed)
Please advise.   Pt not seen in 6 months. Home Health require face to face w/ in 30 days.

## 2017-04-23 NOTE — Telephone Encounter (Signed)
House call done in October or so

## 2017-04-24 NOTE — Telephone Encounter (Signed)
Pts daughter,Renee came in and stated that the pt has a stage 3 bed sore. She stated that she would bring him in if she could but he passes out when they sit him up in the wheelchair.

## 2017-04-28 ENCOUNTER — Telehealth: Payer: Self-pay | Admitting: Family Medicine

## 2017-04-28 MED ORDER — AZITHROMYCIN 250 MG PO TABS: ORAL_TABLET | ORAL | 0 refills | Status: DC

## 2017-04-28 NOTE — Telephone Encounter (Signed)
Verbal given 

## 2017-04-28 NOTE — Telephone Encounter (Signed)
Called and spoke with daughter, Luster LandsbergRenee. Pt finished last dose at breakfast. Home health nurse did come out to see him today. She listened to his lungs and said that he still had crackles in his right lung, upper and lower. They did increased the frequency of his albuterol nebulizer treatments, which has made pt rest better. He is still coughing, but it is improved. It is mostly a dry cough now, no fever, BP normal,  O2 was 92 % (daughter did admit that pt's O2 was never very stable and normal was between 88 -90 % for him).

## 2017-04-28 NOTE — Telephone Encounter (Signed)
Called over the weekend that his cough was no better, was still finishing doxycycline- can we call and check in on how he's doing? Thanks!

## 2017-04-28 NOTE — Telephone Encounter (Signed)
Copied from CRM [id number]. Topic: General - Other >> Apr 28, 2017 11:18 AM Crist InfanteHarrald, Kathy J wrote: Reason for CRM: elizabeth with [employer] calling for verbal orders for  3 wk /1 2 wk 4  For pressure ulcer and breathing issues

## 2017-04-28 NOTE — Telephone Encounter (Signed)
Azithromycin sent over to his pharmacy. If nurse can do home x-ray, that would be great!

## 2017-04-30 NOTE — Telephone Encounter (Signed)
Message left for home health agency to see if home Xray is available.

## 2017-05-20 ENCOUNTER — Telehealth: Payer: Self-pay | Admitting: Family Medicine

## 2017-05-20 NOTE — Telephone Encounter (Signed)
Copied from CRM [phone]. Topic: Quick Communication - See Telephone Encounter >> May 20, 2017  4:19 PM Arlyss Gandyichardson, Cadence Haslam N, NT wrote: CRM for notification. See Telephone encounter for: Pts daughter is calling to check status on Dr. Dossie Arbourrissman coming to pts home to do a home visit since he cannot get in a wheelchair to get out of the house for the home health nurse to continue to come and so that it may be paid for by the insurance company. They had 30 days to do this. The 30 days will be up on 05/24/17.   05/20/17.

## 2017-05-26 ENCOUNTER — Ambulatory Visit: Payer: Medicare Other | Admitting: Family Medicine

## 2017-05-26 ENCOUNTER — Encounter: Payer: Self-pay | Admitting: Family Medicine

## 2017-05-26 DIAGNOSIS — I482 Chronic atrial fibrillation, unspecified: Secondary | ICD-10-CM

## 2017-05-26 DIAGNOSIS — I5022 Chronic systolic (congestive) heart failure: Secondary | ICD-10-CM | POA: Diagnosis not present

## 2017-05-26 DIAGNOSIS — I779 Disorder of arteries and arterioles, unspecified: Secondary | ICD-10-CM | POA: Diagnosis not present

## 2017-05-26 DIAGNOSIS — I739 Peripheral vascular disease, unspecified: Secondary | ICD-10-CM | POA: Diagnosis not present

## 2017-05-26 NOTE — Progress Notes (Addendum)
There were no vitals taken for this visit.   Subjective:    Patient ID: [name] [name], male    DOB: [birth year], 84 y.o.   MRN: [id number]  HPI: [name] [name] is a 84 y.o. male  Housecall Addendum date of house call was Friday, March 22 date of dictation Monday the 25th  Vital signs stable followed by nursing staff patient all in all doing well has had pressure sore which has healed and recovered.  Patient's diet trying to get an extra protein to help with skin breakdown. Concerned about some new redness on his right hip area which is reviewed slight induration no secondary infection patient staying off of it and has nursing help to observe. Patient all in all seems to be in good spirits with no complaints of pain. Patient is bedridden and unable to get out of bed.  Wears a diaper without problems skin breakdown as above. History assist provided by his daughter and caregivers.  Relevant past medical, surgical, family and social history reviewed and updated as indicated. Interim medical history since our last visit reviewed. Allergies and medications reviewed and updated.  Review of Systems  Constitutional: Negative.   Respiratory: Negative.   Cardiovascular: Negative.     Per HPI unless specifically indicated above     Objective:    There were no vitals taken for this visit.  Wt Readings from Last 3 Encounters:  09/25/16 180 lb (81.6 kg)  09/02/16 165 lb 3.2 oz (74.9 kg)  09/02/16 172 lb (78 kg)    Physical Exam  Constitutional: He is oriented to person, place, and time. He appears well-developed and well-nourished. No distress.  HENT:  Head: Normocephalic and atraumatic.  Eyes: Conjunctivae and EOM are normal.  Neck: Normal range of motion.  Cardiovascular: Normal rate, regular rhythm and normal heart sounds.  Pulmonary/Chest: Effort normal and breath sounds normal.  Neurological: He is alert and oriented to person, place, and time.  Skin: He is not diaphoretic. No  erythema.  Psychiatric: He has a normal mood and affect. His behavior is normal. Judgment and thought content normal.    Results for orders placed or performed during the hospital encounter of 09/02/16  Troponin I  Result Value Ref Range   Troponin I <0.03 <0.03 ng/mL  Troponin I  Result Value Ref Range   Troponin I <0.03 <0.03 ng/mL  Troponin I  Result Value Ref Range   Troponin I <0.03 <0.03 ng/mL  CBC  Result Value Ref Range   WBC 6.2 3.8 - 10.6 K/uL   RBC 4.22 (L) 4.40 - 5.90 MIL/uL   Hemoglobin 12.0 (L) 13.0 - 18.0 g/dL   HCT 40.9 (L) 81.1 - 91.4 %   MCV 85.4 80.0 - 100.0 fL   MCH 28.4 26.0 - 34.0 pg   MCHC 33.2 32.0 - 36.0 g/dL   RDW 78.2 (H) 95.6 - 21.3 %   Platelets 236 150 - 440 K/uL  Basic metabolic panel  Result Value Ref Range   Sodium 134 (L) 135 - 145 mmol/L   Potassium 4.6 3.5 - 5.1 mmol/L   Chloride 97 (L) 101 - 111 mmol/L   CO2 26 22 - 32 mmol/L   Glucose, Bld 101 (H) 65 - 99 mg/dL   BUN 20 6 - 20 mg/dL   Creatinine, Ser 0.86 0.61 - 1.24 mg/dL   Calcium 8.6 (L) 8.9 - 10.3 mg/dL   GFR calc non Af Amer >60 >60 mL/min   GFR calc Af  Amer >60 >60 mL/min   Anion gap 11 5 - 15  TSH  Result Value Ref Range   TSH 2.537 0.350 - 4.500 uIU/mL  Troponin I  Result Value Ref Range   Troponin I <0.03 <0.03 ng/mL      Assessment & Plan:   Problem List Items Addressed This Visit      Cardiovascular and Mediastinum   Carotid artery disease (HCC)    The current medical regimen is effective;  continue present plan and medications.       PAD (peripheral artery disease) (HCC)    Not an issue as patient bedridden      Chronic systolic (congestive) heart failure (HCC)    No issues with fluid status and breathing normal.      A-fib (HCC)    The current medical regimen is effective;  continue present plan and medications.        Will continue home nursing care is very important for skin care skin breakdown prevention assist with nutrition body  positioning.  Follow up plan: Return if symptoms worsen or fail to improve.

## 2017-05-26 NOTE — Assessment & Plan Note (Signed)
Not an issue as patient bedridden

## 2017-05-26 NOTE — Assessment & Plan Note (Signed)
No issues with fluid status and breathing normal.

## 2017-05-26 NOTE — Assessment & Plan Note (Signed)
The current medical regimen is effective;  continue present plan and medications.  

## 2017-06-03 ENCOUNTER — Telehealth: Payer: Self-pay | Admitting: Family Medicine

## 2017-06-03 NOTE — Telephone Encounter (Signed)
Copied from CRM [id number]. Topic: General - Other >> Jun 03, 2017 12:24 PM [name] [name], [name] [name], RMA wrote: Reason for CRM: Lawanna Kobusngel from [facility] is calling to notify that visit will decreased to once per week, wound has healed and end of episode will be the week of 06/16/17. An air mattress is being requested for wound, insurance denied claim stating there was missing information, please return call to [place] at [phone]

## 2017-06-03 NOTE — Telephone Encounter (Signed)
Per both [name] [name] and the family insurance denied hospital bed air mattress due to insuffiate documentation of need in notes.  Please advise.

## 2017-06-06 ENCOUNTER — Other Ambulatory Visit: Payer: Self-pay

## 2017-06-06 ENCOUNTER — Telehealth: Payer: Self-pay | Admitting: Family Medicine

## 2017-06-06 ENCOUNTER — Emergency Department: Payer: Medicare Other

## 2017-06-06 ENCOUNTER — Emergency Department
Admission: EM | Admit: 2017-06-06 | Discharge: 2017-06-06 | Disposition: A | Discharge status: Home / Self Care | Payer: Medicare Other | Attending: Emergency Medicine | Admitting: Emergency Medicine

## 2017-06-06 DIAGNOSIS — Z7901 Long term (current) use of anticoagulants: Secondary | ICD-10-CM | POA: Diagnosis not present

## 2017-06-06 DIAGNOSIS — Z951 Presence of aortocoronary bypass graft: Secondary | ICD-10-CM | POA: Insufficient documentation

## 2017-06-06 DIAGNOSIS — Z8673 Personal history of transient ischemic attack (TIA), and cerebral infarction without residual deficits: Secondary | ICD-10-CM | POA: Diagnosis not present

## 2017-06-06 DIAGNOSIS — Z91040 Latex allergy status: Secondary | ICD-10-CM | POA: Diagnosis not present

## 2017-06-06 DIAGNOSIS — Z79899 Other long term (current) drug therapy: Secondary | ICD-10-CM | POA: Diagnosis not present

## 2017-06-06 DIAGNOSIS — I2510 Atherosclerotic heart disease of native coronary artery without angina pectoris: Secondary | ICD-10-CM | POA: Insufficient documentation

## 2017-06-06 DIAGNOSIS — I5022 Chronic systolic (congestive) heart failure: Secondary | ICD-10-CM | POA: Diagnosis not present

## 2017-06-06 DIAGNOSIS — I639 Cerebral infarction, unspecified: Secondary | ICD-10-CM | POA: Insufficient documentation

## 2017-06-06 DIAGNOSIS — Z87891 Personal history of nicotine dependence: Secondary | ICD-10-CM | POA: Insufficient documentation

## 2017-06-06 DIAGNOSIS — H538 Other visual disturbances: Secondary | ICD-10-CM | POA: Diagnosis present

## 2017-06-06 DIAGNOSIS — Z85828 Personal history of other malignant neoplasm of skin: Secondary | ICD-10-CM | POA: Diagnosis not present

## 2017-06-06 LAB — APTT: aPTT: 34 seconds (ref 24–36)

## 2017-06-06 LAB — DIFFERENTIAL
Basophils Absolute: 0 10*3/uL (ref 0–0.1)
Basophils Relative: 1 %
EOS PCT: 3 %
Eosinophils Absolute: 0.2 10*3/uL (ref 0–0.7)
LYMPHS PCT: 13 %
Lymphs Abs: 0.8 10*3/uL — ABNORMAL LOW (ref 1.0–3.6)
MONO ABS: 0.3 10*3/uL (ref 0.2–1.0)
Monocytes Relative: 5 %
NEUTROS ABS: 4.6 10*3/uL (ref 1.4–6.5)
NEUTROS PCT: 78 %

## 2017-06-06 LAB — CBC
HCT: 35.6 % — ABNORMAL LOW (ref 40.0–52.0)
Hemoglobin: 11.6 g/dL — ABNORMAL LOW (ref 13.0–18.0)
MCH: 27.7 pg (ref 26.0–34.0)
MCHC: 32.5 g/dL (ref 32.0–36.0)
MCV: 85 fL (ref 80.0–100.0)
PLATELETS: 196 10*3/uL (ref 150–440)
RBC: 4.19 MIL/uL — AB (ref 4.40–5.90)
RDW: 16.1 % — ABNORMAL HIGH (ref 11.5–14.5)
WBC: 5.9 10*3/uL (ref 3.8–10.6)

## 2017-06-06 LAB — COMPREHENSIVE METABOLIC PANEL
ALK PHOS: 51 U/L (ref 38–126)
ALT: 8 U/L — ABNORMAL LOW (ref 17–63)
ANION GAP: 6 (ref 5–15)
AST: 19 U/L (ref 15–41)
Albumin: 2.6 g/dL — ABNORMAL LOW (ref 3.5–5.0)
BUN: 12 mg/dL (ref 6–20)
CALCIUM: 8.7 mg/dL — AB (ref 8.9–10.3)
CHLORIDE: 95 mmol/L — AB (ref 101–111)
CO2: 34 mmol/L — AB (ref 22–32)
Creatinine, Ser: 0.44 mg/dL — ABNORMAL LOW (ref 0.61–1.24)
GFR calc non Af Amer: >60 mL/min (ref >60)
GLUCOSE: 110 mg/dL — AB (ref 65–99)
Potassium: 4.5 mmol/L (ref 3.5–5.1)
SODIUM: 135 mmol/L (ref 135–145)
Total Bilirubin: 0.6 mg/dL (ref 0.3–1.2)
Total Protein: 5.7 g/dL — ABNORMAL LOW (ref 6.5–8.1)

## 2017-06-06 LAB — GLUCOSE, CAPILLARY: GLUCOSE-CAPILLARY: 92 mg/dL (ref 65–99)

## 2017-06-06 LAB — PROTIME-INR
INR: 1.17
PROTHROMBIN TIME: 14.8 s (ref 11.4–15.2)

## 2017-06-06 LAB — TROPONIN I: Troponin I: <0.03 ng/mL (ref <0.03)

## 2017-06-06 LAB — ETHANOL

## 2017-06-06 NOTE — ED Triage Notes (Signed)
Pt here from home after family noticed that he was having to be prompted to use his left arm and wouldn't look to the left at his family. Family reports that he was awakened at 0500 this am to be turned and again at 0800 to get washed up and change his shirt, at that time the cna had to remind the pt to use the left arm. Pt appears sleepy but will awaken when spoken to, pt is able to state his name, birthdate, and offers his social security number without being asked

## 2017-06-06 NOTE — Telephone Encounter (Signed)
If it's being denied due to insufficient documentation, he will need to be seen. Please try to get him into see his PCP ASAP

## 2017-06-06 NOTE — Telephone Encounter (Signed)
'  Lawanna Kobus' RN with [facility] called to report during visit for wound care tx. pt developed left sided weakness, blurred vision and facial droop. States no left hand grip. EMS called and awaiting transport. Lawanna Kobus present with patient.

## 2017-06-06 NOTE — ED Notes (Signed)
Dr rifenbark at bedside 

## 2017-06-06 NOTE — Telephone Encounter (Signed)
Pt's daughter called in to follow up on request. Advised per message. Daughter states that she is aware that PCP is out of the office today. She said that pt has another deep tissue wound since the paperwork has been submitted.    She would like to discuss further.   CB: [phone]

## 2017-06-06 NOTE — Discharge Instructions (Signed)
Please begin taking a baby aspirin every day and follow-up with your primary care physician this coming Monday for a recheck.  Return to the emergency department sooner for any concerns whatsoever.  It was a pleasure to take care of you today, and thank you for coming to our emergency department.  If you have any questions or concerns before leaving please ask the nurse to grab me and I'm more than happy to go through your aftercare instructions again.  If you were prescribed any opioid pain medication today such as Norco, Vicodin, Percocet, morphine, hydrocodone, or oxycodone please make sure you do not drive when you are taking this medication as it can alter your ability to drive safely.  If you have any concerns once you are home that you are not improving or are in fact getting worse before you can make it to your follow-up appointment, please do not hesitate to call 911 and come back for further evaluation.  Merrily Brittle, MD  Results for orders placed or performed during the hospital encounter of 06/06/17  Ethanol  Result Value Ref Range   Alcohol, Ethyl (B) <10 <10 mg/dL  Protime-INR  Result Value Ref Range   Prothrombin Time 14.8 11.4 - 15.2 seconds   INR 1.17   APTT  Result Value Ref Range   aPTT 34 24 - 36 seconds  CBC  Result Value Ref Range   WBC 5.9 3.8 - 10.6 K/uL   RBC 4.19 (L) 4.40 - 5.90 MIL/uL   Hemoglobin 11.6 (L) 13.0 - 18.0 g/dL   HCT 66.4 (L) 40.3 - 47.4 %   MCV 85.0 80.0 - 100.0 fL   MCH 27.7 26.0 - 34.0 pg   MCHC 32.5 32.0 - 36.0 g/dL   RDW 25.9 (H) 56.3 - 87.5 %   Platelets 196 150 - 440 K/uL  Differential  Result Value Ref Range   Neutrophils Relative % 78 %   Neutro Abs 4.6 1.4 - 6.5 K/uL   Lymphocytes Relative 13 %   Lymphs Abs 0.8 (L) 1.0 - 3.6 K/uL   Monocytes Relative 5 %   Monocytes Absolute 0.3 0.2 - 1.0 K/uL   Eosinophils Relative 3 %   Eosinophils Absolute 0.2 0 - 0.7 K/uL   Basophils Relative 1 %   Basophils Absolute 0.0 0 - 0.1 K/uL    Comprehensive metabolic panel  Result Value Ref Range   Sodium 135 135 - 145 mmol/L   Potassium 4.5 3.5 - 5.1 mmol/L   Chloride 95 (L) 101 - 111 mmol/L   CO2 34 (H) 22 - 32 mmol/L   Glucose, Bld 110 (H) 65 - 99 mg/dL   BUN 12 6 - 20 mg/dL   Creatinine, Ser 6.43 (L) 0.61 - 1.24 mg/dL   Calcium 8.7 (L) 8.9 - 10.3 mg/dL   Total Protein 5.7 (L) 6.5 - 8.1 g/dL   Albumin 2.6 (L) 3.5 - 5.0 g/dL   AST 19 15 - 41 U/L   ALT 8 (L) 17 - 63 U/L   Alkaline Phosphatase 51 38 - 126 U/L   Total Bilirubin 0.6 0.3 - 1.2 mg/dL   GFR calc non Af Amer >60 >60 mL/min   GFR calc Af Amer >60 >60 mL/min   Anion gap 6 5 - 15  Troponin I  Result Value Ref Range   Troponin I <0.03 <0.03 ng/mL  Glucose, capillary  Result Value Ref Range   Glucose-Capillary 92 65 - 99 mg/dL   Ct Head Code Stroke  Wo Contrast  Result Date: 06/06/2017 CLINICAL DATA:  Code stroke. Focal neuro deficit. Limited use of the left arm. EXAM: CT HEAD WITHOUT CONTRAST TECHNIQUE: Contiguous axial images were obtained from the base of the skull through the vertex without intravenous contrast. COMPARISON:  09/02/2016 FINDINGS: Brain: No evidence of acute infarction, hemorrhage, obstructive hydrocephalus, extra-axial collection or mass lesion/mass effect. Chronic ventriculomegaly with disproportionate subarachnoid space enlargement and upward bowing of the corpus callosum. Stable low-density in the cerebral white matter best attributed to chronic small vessel ischemia. Vascular: Atherosclerotic calcification.  No hyperdense vessel. Skull: No acute or aggressive finding. Sinuses/Orbits: Bilateral mastoid opacification that has progressed from prior. Stable negative nasopharynx. Other: These results were called by telephone at the time of interpretation on 06/06/2017 at 1:09 pm to Dr. Merrily BrittleNEIL Javius Sylla , who verbally acknowledged these results. ASPECTS [facility]([place] Stroke Program Early CT Score) - Ganglionic level infarction (caudate, lentiform nuclei, internal  capsule, insula, M1-M3 cortex): 7 - Supraganglionic infarction (M4-M6 cortex): 3 Total score (0-10 with 10 being normal): 10 IMPRESSION: 1. No acute finding. 2. 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 3. Chronic small vessel ischemia. Electronically Signed   By: Marnee SpringJonathon  Watts M.D.   On: 06/06/2017 13:11

## 2017-06-06 NOTE — Progress Notes (Signed)
CODE STROKE- PHARMACY COMMUNICATION   Time CODE STROKE called/page received: 1237  Time response to CODE STROKE was made: 1245  Time Stroke Kit retrieved from [facility] (only if needed):N/A  No tPA given at this time. Medication reconciliation completed.    Past Medical History:  Diagnosis Date  . A-fib ([place])   . Bradycardia    06/2010  . CAD (coronary artery disease)    nuclear 01/2008, no ischemia, EF 60%, distal anterior scar  . Carotid artery disease ([place])    doppler 05/2009,  0-39% bilateral / doppler 06/2010 [place]  . Edema    Ankle edema August, 2012  . Ejection fraction    EF 45%, echo,05/2009,apical hypo,  ?? posterior and lateral hypo  . Elevated CPK    11/23/08.Marland KitchenMarland Kitchenprobably from debris from popliteal aneurysm  . Fall at home July 2015  . Hx of CABG    1998  . Hyperlipidemia   . Intracranial hemorrhage ([place])    Two small subependymal bleeds 06/2010,,ASA held  . PAD (peripheral artery disease) (HCC)    Above the knee to below the knee popliteal bypass,, right  //    Doppler,  July, 2013, patent  . Popliteal aneurysm ([place])    .repair..11/25/2008  Dr. Oneida Alar  . Prominent abdominal aortic pulsation    doppler normal  . Speech problem    Neurologic event with brief infusion and speech difficulty.... January, 2011.... being assessed by neurology  February, 2011  . Syncope    .remote..no etiology  /  syncope 11/2008.. from acute leg pain  . TIA (transient ischemic attack)    Brief episode of confusion and speech difficulty, January, 2012  /  recurrent confusion speech difficulty September, 2012  . Venous insufficiency    support hose   Prior to Admission medications   Medication Sig Start Date End Date Taking? Authorizing Provider  acetaminophen (TYLENOL) 325 MG tablet Take 2 tablets (650 mg total) by mouth every 6 (six) hours as needed for mild pain (or Fever >/= 101). 10/08/15   Gouru, Aruna, MD  albuterol (PROVENTIL HFA;VENTOLIN HFA) 108 (90 Base) MCG/ACT inhaler Inhale 2 puffs  every 6 (six) hours as needed into the lungs for wheezing or shortness of breath. 01/07/17   Johnson, Megan P, DO  albuterol (PROVENTIL) (2.5 MG/3ML) 0.083% nebulizer solution Take 3 mLs (2.5 mg total) every 6 (six) hours as needed by nebulization for wheezing or shortness of breath. DX: R05 01/07/17   Johnson, Megan P, DO  apixaban (ELIQUIS) 5 MG TABS tablet Take 1 tablet (5 mg total) by mouth 2 (two) times daily. 12/20/15   Guadalupe Maple, MD  azithromycin (ZITHROMAX) 250 MG tablet 2 now then 1 a day 04/28/17   Wynetta Emery, Megan P, DO  benzonatate (TESSALON) 100 MG capsule TAKE ONE CAPSULE BY MOUTH EVERY 8 HOURS AS NEEDED COUGH 06/27/16   Guadalupe Maple, MD  calcium carbonate (TUMS - DOSED IN MG ELEMENTAL CALCIUM) 500 MG chewable tablet Chew 2 tablets by mouth daily with supper.     [provider]  ciprofloxacin (CIPRO) 250 MG tablet Take 1 tablet (250 mg total) 2 (two) times daily by mouth. 01/13/17   Crissman, Jeannette How, MD  docusate sodium (COLACE) 100 MG capsule Take 100 mg by mouth 2 (two) times daily.     [provider]  doxycycline (VIBRA-TABS) 100 MG tablet Take 1 tablet (100 mg total) by mouth 2 (two) times daily. 04/18/17   Johnson, Megan P, DO  fluticasone (FLONASE) 50  MCG/ACT nasal spray Place 1 spray into both nostrils daily. 08/22/15   Dustin Flock, MD  guaiFENesin (MUCINEX) 600 MG 12 hr tablet Take 1 tablet (600 mg total) by mouth 2 (two) times daily. 08/22/15   Dustin Flock, MD  levETIRAcetam (KEPPRA) 500 MG tablet Take 1/2 tab in AM, 1.5 tabs in PM 09/25/16   Cameron Sprang, MD  Multiple Vitamin (MULTIVITAMIN WITH MINERALS) TABS tablet Take 1 tablet by mouth daily with breakfast.     [provider]  polyethylene glycol (MIRALAX / GLYCOLAX) packet Take 17 g by mouth daily.    [provider]  Vitamin D, Cholecalciferol, 1000 units TABS Take 2,000 tablets by mouth daily.     [provider]    Candelaria Stagers ,PharmD Pharmacy Resident   06/06/2017  12:50 PM

## 2017-06-06 NOTE — ED Notes (Signed)
Pt turned to his left side for comfort, no distress noted, denies any pain at this time, daughter at bedside

## 2017-06-06 NOTE — Consult Note (Signed)
Referring Physician: Rifenbark    Chief Complaint: Left sided weakness  HPI: [name] [name] is an 84 y.o. male with a history of afib on Eliquis who presents with left sided weakness.  Patient is bed bound at baseline.  Per report of daughter patient had trouble using his left when bathing this morning at 0800.  Was able to eat breakfast but noted that he needed to be reminded to use his left arm.  Facial droop was noted as well.  Patient was brought in for evaluation.   Initial NIHSS of 12.    Date last known well: Date: 06/05/2017 Time last known well: Time: 21:00 tPA Given: No: Outside time window, on Eliquis  Past Medical History:  Diagnosis Date  . A-fib (HCC)   . Bradycardia    06/2010  . CAD (coronary artery disease)    nuclear 01/2008, no ischemia, EF 60%, distal anterior scar  . Carotid artery disease (HCC)    doppler 05/2009,  0-39% bilateral / doppler 06/2010 OK  . Edema    Ankle edema August, 2012  . Ejection fraction    EF 45%, echo,05/2009,apical hypo,  ?? posterior and lateral hypo  . Elevated CPK    11/23/08.Marland Kitchen.Marland Kitchen.probably from debris from popliteal aneurysm  . Fall at home July 2015  . Hx of CABG    1998  . Hyperlipidemia   . Intracranial hemorrhage (HCC)    Two small subependymal bleeds 06/2010,,ASA held  . PAD (peripheral artery disease) (HCC)    Above the knee to below the knee popliteal bypass,, right  //    Doppler,  July, 2013, patent  . Popliteal aneurysm (HCC)    .repair..11/25/2008  Dr. Darrick PennaFields  . Prominent abdominal aortic pulsation    doppler normal  . Speech problem    Neurologic event with brief infusion and speech difficulty.... January, 2011.... being assessed by neurology  February, 2011  . Syncope    .remote..no etiology  /  syncope 11/2008.. from acute leg pain  . TIA (transient ischemic attack)    Brief episode of confusion and speech difficulty, January, 2012  /  recurrent confusion speech difficulty September, 2012  . Venous insufficiency    support hose    Past Surgical History:  Procedure Laterality Date  . CORONARY ARTERY BYPASS GRAFT  1998  . popliteal bypass  with reversed greater saphenous vein from right leg.      Family History  Problem Relation Age of Onset  . Stroke Mother   . Diabetes Mother   . Diabetes Sister    Social History:  reports that he quit smoking about 70 years ago. His smoking use included cigarettes. He quit after 20.00 years of use. He has never used smokeless tobacco. He reports that he does not drink alcohol or use drugs.  Allergies:  Allergies  Allergen Reactions  . Penicillins Hives    Has patient had a PCN reaction causing immediate rash, facial/tongue/throat swelling, SOB or lightheadedness with hypotension: no Has patient had a PCN reaction causing severe rash involving mucus membranes or skin necrosis: no Has patient had a PCN reaction that required hospitalization no Has patient had a PCN reaction occurring within the last 10 years: no If all of the above answers are "NO", then may proceed with Cephalosporin use.   . Latex Rash  . Sulfonamide Derivatives Rash    Medications: I have reviewed the patient's current medications. Prior to Admission:  Prior to Admission medications   Medication Sig Start Date  End Date Taking? Authorizing Provider  acetaminophen (TYLENOL) 325 MG tablet Take 2 tablets (650 mg total) by mouth every 6 (six) hours as needed for mild pain (or Fever >/= 101). 10/08/15  Yes Gouru, Aruna, MD  albuterol (PROVENTIL HFA;VENTOLIN HFA) 108 (90 Base) MCG/ACT inhaler Inhale 2 puffs every 6 (six) hours as needed into the lungs for wheezing or shortness of breath. 01/07/17  Yes Johnson, Megan P, DO  albuterol (PROVENTIL) (2.5 MG/3ML) 0.083% nebulizer solution Take 3 mLs (2.5 mg total) every 6 (six) hours as needed by nebulization for wheezing or shortness of breath. DX: R05 01/07/17  Yes Johnson, Megan P, DO  apixaban (ELIQUIS) 5 MG TABS tablet Take 1 tablet (5 mg total) by  mouth 2 (two) times daily. 12/20/15  Yes Crissman, Redge Gainer, MD  benzonatate (TESSALON) 100 MG capsule TAKE ONE CAPSULE BY MOUTH EVERY 8 HOURS AS NEEDED COUGH 06/27/16  Yes Crissman, Redge Gainer, MD  calcium carbonate (TUMS - DOSED IN MG ELEMENTAL CALCIUM) 500 MG chewable tablet Chew 2 tablets by mouth daily with supper.    Yes [provider]  docusate sodium (COLACE) 100 MG capsule Take 100 mg by mouth daily as needed for mild constipation.    Yes [provider]  fluticasone (FLONASE) 50 MCG/ACT nasal spray Place 1 spray into both nostrils daily. Patient taking differently: Place 1 spray into both nostrils daily as needed for allergies.  08/22/15  Yes Auburn Bilberry, MD  guaiFENesin (MUCINEX) 600 MG 12 hr tablet Take 1 tablet (600 mg total) by mouth 2 (two) times daily. 08/22/15  Yes Auburn Bilberry, MD  levETIRAcetam (KEPPRA) 500 MG tablet Take 1/2 tab in AM, 1.5 tabs in PM 09/25/16  Yes Van Clines, MD  Multiple Vitamin (MULTIVITAMIN WITH MINERALS) TABS tablet Take 1 tablet by mouth daily with breakfast.    Yes [provider]  polyethylene glycol (MIRALAX / GLYCOLAX) packet Take 17 g by mouth daily as needed for mild constipation.    Yes [provider]  Vitamin D, Cholecalciferol, 1000 units TABS Take 2,000 tablets by mouth daily.    Yes [provider]  azithromycin (ZITHROMAX) 250 MG tablet 2 now then 1 a day Patient not taking: Reported on 06/06/2017 04/28/17   Olevia Perches P, DO  ciprofloxacin (CIPRO) 250 MG tablet Take 1 tablet (250 mg total) 2 (two) times daily by mouth. Patient not taking: Reported on 06/06/2017 01/13/17   Steele Sizer, MD  doxycycline (VIBRA-TABS) 100 MG tablet Take 1 tablet (100 mg total) by mouth 2 (two) times daily. Patient not taking: Reported on 06/06/2017 04/18/17   Olevia Perches P, DO    ROS: Unable to provide due to dementia  Physical Examination: Blood pressure 115/72, pulse 83, resp. rate 16, height 5\' 7"  (1.702  m), weight 81.6 kg (180 lb), SpO2 95 %.  HEENT-  Normocephalic, no lesions, without obvious abnormality.  Normal external eye and conjunctiva.  Normal TM's bilaterally.  Normal auditory canals and external ears. Normal external nose, mucus membranes and septum.  Normal pharynx. Cardiovascular- S1, S2 normal, pulses palpable throughout   Lungs- chest clear, no wheezing, rales, normal symmetric air entry Abdomen- soft, non-tender; bowel sounds normal; no masses,  no organomegaly Extremities- LE edema Lymph-no adenopathy palpable Musculoskeletal-no joint tenderness, deformity or swelling Skin-warm and dry, no hyperpigmentation, vitiligo, or suspicious lesions  Neurological Examination   Mental Status: Lethargic but easily aroused.  Follows simple commands.  Speech dysarthric but fluent. Cranial Nerves: II: Discs flat  bilaterally; Visual fields grossly normal, pupils equal, round, reactive to light and accommodation III,IV, VI: ptosis not present, extra-ocular motions intact bilaterally V,VII: mild left facial droop, facial light touch sensation normal bilaterally VIII: hearing normal bilaterally IX,X: gag reflex present XI: bilateral shoulder shrug XII: midline tongue extension Motor: Right : Upper extremity   5/5    Left:     Upper extremity   4/5 Unable to lift either lower extremity off the bed Sensory: Pinprick and light touch intact throughout, bilaterally Deep Tendon Reflexes: 2+ in the upper extremities and absent in the lower extremities Plantars: Right: mute   Left: mute Cerebellar: Normal finger-to-nose testing Gait: not tested due to safety concerns    Laboratory Studies:  Basic Metabolic Panel: Recent Labs  Lab 06/06/17 1313  NA 135  K 4.5  CL 95*  CO2 34*  GLUCOSE 110*  BUN 12  CREATININE 0.44*  CALCIUM 8.7*    Liver Function Tests: Recent Labs  Lab 06/06/17 1313  AST 19  ALT 8*  ALKPHOS 51  BILITOT 0.6  PROT 5.7*  ALBUMIN 2.6*   No results for  input(s): LIPASE, AMYLASE in the last 168 hours. No results for input(s): AMMONIA in the last 168 hours.  CBC: Recent Labs  Lab 06/06/17 1313  WBC 5.9  NEUTROABS 4.6  HGB 11.6*  HCT 35.6*  MCV 85.0  PLT 196    Cardiac Enzymes: Recent Labs  Lab 06/06/17 1313  TROPONINI <0.03    BNP: Invalid input(s): POCBNP  CBG: Recent Labs  Lab 06/06/17 1244  GLUCAP 92    Microbiology: Results for orders placed or performed in visit on 11/08/15  Microscopic Examination     Status: Abnormal   Collection Time: 11/09/15  8:08 AM  Result Value Ref Range Status   WBC, UA None seen 0 - 5 /hpf Final   RBC, UA 0-2 0 - 2 /hpf Final   Epithelial Cells (non renal) 0-10 0 - 10 /hpf Final   Crystals Present (A) N/A Final   Crystal Type Calcium Oxalate N/A Final   Mucus, UA Present Not Estab. Final   Bacteria, UA Few None seen/Few Final    Coagulation Studies: Recent Labs    06/06/17 1313  LABPROT 14.8  INR 1.17    Urinalysis: No results for input(s): COLORURINE, LABSPEC, PHURINE, GLUCOSEU, HGBUR, BILIRUBINUR, KETONESUR, PROTEINUR, UROBILINOGEN, NITRITE, LEUKOCYTESUR in the last 168 hours.  Invalid input(s): APPERANCEUR  Lipid Panel:    Component Value Date/Time   CHOL 142 08/20/2016 0455   CHOL 128 12/20/2015 1455   TRIG 48 08/20/2016 0455   HDL 48 08/20/2016 0455   HDL 41 12/20/2015 1455   CHOLHDL 3.0 08/20/2016 0455   VLDL 10 08/20/2016 0455   LDLCALC 84 08/20/2016 0455   LDLCALC 65 12/20/2015 1455    HgbA1C:  Lab Results  Component Value Date   HGBA1C 5.3 08/17/2015    Urine Drug Screen:  No results found for: LABOPIA, COCAINSCRNUR, LABBENZ, AMPHETMU, THCU, LABBARB  Alcohol Level:  Recent Labs  Lab 06/06/17 1313  ETH <10    Other results: EKG: sinus rhythm at 83 bpm.  Imaging: Ct Head Code Stroke Wo Contrast  Result Date: 06/06/2017 CLINICAL DATA:  Code stroke. Focal neuro deficit. Limited use of the left arm. EXAM: CT HEAD WITHOUT CONTRAST TECHNIQUE:  Contiguous axial images were obtained from the base of the skull through the vertex without intravenous contrast. COMPARISON:  09/02/2016 FINDINGS: Brain: No evidence of acute infarction, hemorrhage, obstructive hydrocephalus,  extra-axial collection or mass lesion/mass effect. Chronic ventriculomegaly with disproportionate subarachnoid space enlargement and upward bowing of the corpus callosum. Stable low-density in the cerebral white matter best attributed to chronic small vessel ischemia. Vascular: Atherosclerotic calcification.  No hyperdense vessel. Skull: No acute or aggressive finding. Sinuses/Orbits: Bilateral mastoid opacification that has progressed from prior. Stable negative nasopharynx. Other: These results were called by telephone at the time of interpretation on 06/06/2017 at 1:09 pm to Dr. Merrily Brittle , who verbally acknowledged these results. ASPECTS [facility] Stroke Program Early CT Score) - Ganglionic level infarction (caudate, lentiform nuclei, internal capsule, insula, M1-M3 cortex): 7 - Supraganglionic infarction (M4-M6 cortex): 3 Total score (0-10 with 10 being normal): 10 IMPRESSION: 1. No acute finding. 2. 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 3. Chronic small vessel ischemia. Electronically Signed   By: Marnee Spring M.D.   On: 06/06/2017 13:11    Assessment: 84 y.o. male presenting with left sided weakness and LHH.  Examination very similar to presentation in June of 2017.  Patient with history of atrial fibrillation on Eliquis.  Bed bound.  Head CT reviewed and shows no acute changes or evidence of hemorrhage.  Patient with a history of cardiac disease.  On maximum medical therapy at this time.  Has had issues on ASA in the past but daughter would like to try again and will stop if patient unable to tolerate.  Risks discussed.    Stroke Risk Factors - atrial fibrillation and hyperlipidemia  Plan: 1. Continue Eliquis.  Add ASA 81mg  daily 2.  NPO  until RN stroke swallow screen 3. Discussed possibility of returning home with patient from ED.  Daughter to discuss with family.  They may prefer an overnight observation.  No further work up indicated at this time.      Thana Farr, MD Neurology [phone] 06/06/2017, 2:19 PM

## 2017-06-06 NOTE — Telephone Encounter (Signed)
Routing to PCP

## 2017-06-06 NOTE — Code Documentation (Signed)
Pt arrives via EMS from home, per family when the home health nurse visited at 1000 she noticed left sided weakness and decreased movement, per family when aid was trying to dress pt this AM at 0800 she had to help him move his left arm, per daughter pt was turned at 0700 this AM but unsure if the weakness was present then, Code stroke activated in room 26, pt taken from room 26 to CT for non-con head CT and then back to room, NIHSS 12, pt demonstrated left hemianopia and no movement in lower extremities pt bed bound with heel protectors in place on both feet, pt on eliquis with last dose 06/06/17 at 1040, no tPA given, report off to Micron TechnologyBrandy RN, family at bedside

## 2017-06-06 NOTE — ED Provider Notes (Signed)
[facility] Emergency Department Provider Note  ____________________________________________   First MD Initiated Contact with Patient 06/06/17 1235     (approximate)  I have reviewed the triage vital signs and the nursing notes.   HISTORY  Chief Complaint Weakness  History is limited by the patient's clinical condition  HPI [name] [name] is a 84 y.o. male who comes to the emergency department via EMS with difficulty seeing out of his left eye and left arm weakness along with some confusion.  The patient's last known well time was 9:50 AM.  The patient has had no difficulty swallowing.  No chest pain or shortness of breath.  No fevers or chills.  No abdominal pain nausea or vomiting.  Past Medical History:  Diagnosis Date  . A-fib (HCC)   . Bradycardia    06/2010  . CAD (coronary artery disease)    nuclear 01/2008, no ischemia, EF 60%, distal anterior scar  . Carotid artery disease (HCC)    doppler 05/2009,  0-39% bilateral / doppler 06/2010 OK  . Edema    Ankle edema August, 2012  . Ejection fraction    EF 45%, echo,05/2009,apical hypo,  ?? posterior and lateral hypo  . Elevated CPK    11/23/08.Marland KitchenMarland Kitchenprobably from debris from popliteal aneurysm  . Fall at home July 2015  . Hx of CABG    1998  . Hyperlipidemia   . Intracranial hemorrhage (HCC)    Two small subependymal bleeds 06/2010,,ASA held  . PAD (peripheral artery disease) (HCC)    Above the knee to below the knee popliteal bypass,, right  //    Doppler,  July, 2013, patent  . Popliteal aneurysm (HCC)    .repair..11/25/2008  Dr. Darrick Penna  . Prominent abdominal aortic pulsation    doppler normal  . Speech problem    Neurologic event with brief infusion and speech difficulty.... January, 2011.... being assessed by neurology  February, 2011  . Syncope    .remote..no etiology  /  syncope 11/2008.. from acute leg pain  . TIA (transient ischemic attack)    Brief episode of confusion and speech  difficulty, January, 2012  /  recurrent confusion speech difficulty September, 2012  . Venous insufficiency    support hose    Patient Active Problem List   Diagnosis Date Noted  . Pressure injury of skin 08/20/2016  . Anemia 03/19/2016  . Seizure (HCC) 10/05/2015  . A-fib (HCC) 10/05/2015  . Chronic systolic (congestive) heart failure (HCC) 09/07/2015  . Altered mental status 08/18/2015  . CAD in native artery 08/07/2015  . Arterial disease (HCC) 08/07/2015  . HLD (hyperlipidemia) 08/07/2015  . Temporary cerebral vascular dysfunction 08/07/2015  . Chronic venous insufficiency 08/07/2015  . H/O neoplasm 08/22/2014  . H/O malignant neoplasm of skin 08/22/2014  . 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 Peripheral vascular disease (HCC) 02/28/2014  . Postsurgical aortocoronary bypass status 02/28/2014  . Syncope and collapse 02/28/2014  . 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 PAD (peripheral artery disease) (HCC)   . TIA (transient ischemic attack)   . Edema   . Hyperlipidemia   . Syncope   . CAD (coronary artery disease)   . Venous insufficiency   . Carotid artery disease (HCC)   . Elevated CPK   . Popliteal aneurysm (HCC)   . Hx of CABG   . Intracranial hemorrhage [facility])     Past Surgical History:  Procedure Laterality  Date  . CORONARY ARTERY BYPASS GRAFT  1998  . popliteal bypass  with reversed greater saphenous vein from right leg.      Prior to Admission medications   Medication Sig Start Date End Date Taking? Authorizing Provider  acetaminophen (TYLENOL) 325 MG tablet Take 2 tablets (650 mg total) by mouth every 6 (six) hours as needed for mild pain (or Fever >/= 101). 10/08/15  Yes Gouru, Aruna, MD  albuterol (PROVENTIL HFA;VENTOLIN HFA) 108 (90 Base) MCG/ACT inhaler Inhale 2 puffs every 6 (six) hours as needed into the lungs for wheezing or shortness of breath. 01/07/17  Yes  Johnson, Megan P, DO  albuterol (PROVENTIL) (2.5 MG/3ML) 0.083% nebulizer solution Take 3 mLs (2.5 mg total) every 6 (six) hours as needed by nebulization for wheezing or shortness of breath. DX: R05 01/07/17  Yes Johnson, Megan P, DO  apixaban (ELIQUIS) 5 MG TABS tablet Take 1 tablet (5 mg total) by mouth 2 (two) times daily. 12/20/15  Yes Crissman, Redge GainerMark A, MD  benzonatate (TESSALON) 100 MG capsule TAKE ONE CAPSULE BY MOUTH EVERY 8 HOURS AS NEEDED COUGH 06/27/16  Yes Crissman, Redge GainerMark A, MD  calcium carbonate (TUMS - DOSED IN MG ELEMENTAL CALCIUM) 500 MG chewable tablet Chew 2 tablets by mouth daily with supper.    Yes [provider]  docusate sodium (COLACE) 100 MG capsule Take 100 mg by mouth daily as needed for mild constipation.    Yes [provider]  fluticasone (FLONASE) 50 MCG/ACT nasal spray Place 1 spray into both nostrils daily. Patient taking differently: Place 1 spray into both nostrils daily as needed for allergies.  08/22/15  Yes Auburn BilberryPatel, Shreyang, MD  guaiFENesin (MUCINEX) 600 MG 12 hr tablet Take 1 tablet (600 mg total) by mouth 2 (two) times daily. 08/22/15  Yes Auburn BilberryPatel, Shreyang, MD  levETIRAcetam (KEPPRA) 500 MG tablet Take 1/2 tab in AM, 1.5 tabs in PM 09/25/16  Yes Van ClinesAquino, Karen M, MD  Multiple Vitamin (MULTIVITAMIN WITH MINERALS) TABS tablet Take 1 tablet by mouth daily with breakfast.    Yes [provider]  polyethylene glycol (MIRALAX / GLYCOLAX) packet Take 17 g by mouth daily as needed for mild constipation.    Yes [provider]  Vitamin D, Cholecalciferol, 1000 units TABS Take 2,000 tablets by mouth daily.    Yes [provider]  azithromycin (ZITHROMAX) 250 MG tablet 2 now then 1 a day Patient not taking: Reported on 06/06/2017 04/28/17   Olevia PerchesJohnson, Megan P, DO  ciprofloxacin (CIPRO) 250 MG tablet Take 1 tablet (250 mg total) 2 (two) times daily by mouth. Patient not taking: Reported on 06/06/2017 01/13/17   Steele Sizerrissman, Mark A, MD    doxycycline (VIBRA-TABS) 100 MG tablet Take 1 tablet (100 mg total) by mouth 2 (two) times daily. Patient not taking: Reported on 06/06/2017 04/18/17   Olevia PerchesJohnson, Megan P, DO    Allergies Penicillins; Latex; and Sulfonamide derivatives  Family History  Problem Relation Age of Onset  . Stroke Mother   . Diabetes Mother   . Diabetes Sister     Social History Social History   Tobacco Use  . Smoking status: Former Smoker    Years: 20.00    Types: Cigarettes    Last attempt to quit: 09/12/1946    Years since quitting: 70.7  . Smokeless tobacco: Never Used  . Tobacco comment: quit appox 50 yrs ago  Substance Use Topics  . Alcohol use: No  . Drug use: No    Review  of Systems History limited by the patient's clinical condition  ____________________________________________   PHYSICAL EXAM:  VITAL SIGNS: ED Triage Vitals  Enc Vitals Group     BP      Pulse      Resp      Temp      Temp src      SpO2      Weight      Height      Head Circumference      Peak Flow      Pain Score      Pain Loc      Pain Edu?      Excl. in GC?     Constitutional: No acute distress.  Speaks in 1-2 word sentences Eyes: PERRL EOMI. mid range and brisk Head: Atraumatic. Nose: No congestion/rhinnorhea. Mouth/Throat: No trismus Neck: No stridor.   Cardiovascular: Normal rate, regular rhythm. Grossly normal heart sounds.  Good peripheral circulation. Respiratory: Normal respiratory effort.  No retractions. Lungs CTAB and moving good air Gastrointestinal: Soft nontender Musculoskeletal: No lower extremity edema   Neurologic: Able to move left upper extremity although decreased strength in left upper extremity Skin:  Skin is warm, dry and intact. No rash noted.     ____________________________________________   DIFFERENTIAL includes but not limited to  Stroke, TIA, intracerebral hemorrhage, infection ____________________________________________   LABS (all labs ordered are  listed, but only abnormal results are displayed)  Labs Reviewed  CBC - Abnormal; Notable for the following components:      Result Value   RBC 4.19 (*)    Hemoglobin 11.6 (*)    HCT 35.6 (*)    RDW 16.1 (*)    All other components within normal limits  DIFFERENTIAL - Abnormal; Notable for the following components:   Lymphs Abs 0.8 (*)    All other components within normal limits  COMPREHENSIVE METABOLIC PANEL - Abnormal; Notable for the following components:   Chloride 95 (*)    CO2 34 (*)    Glucose, Bld 110 (*)    Creatinine, Ser 0.44 (*)    Calcium 8.7 (*)    Total Protein 5.7 (*)    Albumin 2.6 (*)    ALT 8 (*)    All other components within normal limits  ETHANOL  PROTIME-INR  APTT  TROPONIN I  GLUCOSE, CAPILLARY    Lab work reviewed by me with no acute disease __________________________________________  EKG ED ECG REPORT I, Merrily Brittle, the attending physician, personally viewed and interpreted this ECG.  Date: 06/06/2017 EKG Time:  Rate: 83 Rhythm: normal sinus rhythm QRS Axis: normal Intervals: normal ST/T Wave abnormalities: normal Narrative Interpretation: no evidence of acute ischemia  ____________________________________________  RADIOLOGY  Head CT reviewed by me with chronic changes but no acute disease ____________________________________________   PROCEDURES  Procedure(s) performed: no  Procedures  Critical Care performed: no  Observation: no ____________________________________________   INITIAL IMPRESSION / ASSESSMENT AND PLAN / ED COURSE  Pertinent labs & imaging results that were available during my care of the patient were reviewed by me and considered in my medical decision making (see chart for details).  The patient arrives within 3 hours of the last known well with acute left upper extremity weakness.  Code stroke called.  The patient's head CT shows chronic changes but no acute disease.  Neurologist Dr. Thad Ranger came  to bedside and evaluated the patient and deemed him to not be a TPA or any intervention candidate.  She and I  both had lengthy conversations with the patient and family regarding inpatient admission versus outpatient management and together we all agreed that the patient would be best served by outpatient management.  We will add on aspirin in addition to his chronic Eliquis.  The patient is discharged home in improved condition family verbalizes understanding and agree with the plan.      ____________________________________________   FINAL CLINICAL IMPRESSION(S) / ED DIAGNOSES  Final diagnoses:  Cerebrovascular accident (CVA), unspecified mechanism (HCC)      NEW MEDICATIONS STARTED DURING THIS VISIT:  New Prescriptions   No medications on file     Note:  This document was prepared using Dragon voice recognition software and may include unintentional dictation errors.     Merrily Brittle, MD 06/06/17 [id number]

## 2017-06-06 NOTE — ED Notes (Signed)
Pt is in no acute distress at this time, when pt is asked to protrude his tongue, the tongue deviates to the right. Pt does not look to the left during his exam. Daughter at bedside

## 2017-06-06 NOTE — Progress Notes (Signed)
   06/06/17 1250  Clinical Encounter Type  Visited With Patient and family together  Visit Type Initial  Referral From Nurse  Consult/Referral To Chaplain  Spiritual Encounters  Spiritual Needs Emotional   CH responded to a Code Stroke for PT. Ch arrived at [facility] to find the PT and family waiting for results of tests. CH offered emotional support and prayed silently for PT. CH will follow up as needed.

## 2017-06-17 ENCOUNTER — Encounter: Payer: Self-pay | Admitting: Emergency Medicine

## 2017-06-17 ENCOUNTER — Other Ambulatory Visit: Payer: Self-pay

## 2017-06-17 ENCOUNTER — Emergency Department: Payer: Medicare Other

## 2017-06-17 ENCOUNTER — Emergency Department
Admission: EM | Admit: 2017-06-17 | Discharge: 2017-06-17 | Disposition: A | Discharge status: Home / Self Care | Payer: Medicare Other | Attending: Emergency Medicine | Admitting: Emergency Medicine

## 2017-06-17 DIAGNOSIS — Z87891 Personal history of nicotine dependence: Secondary | ICD-10-CM | POA: Diagnosis not present

## 2017-06-17 DIAGNOSIS — I2510 Atherosclerotic heart disease of native coronary artery without angina pectoris: Secondary | ICD-10-CM | POA: Insufficient documentation

## 2017-06-17 DIAGNOSIS — I110 Hypertensive heart disease with heart failure: Secondary | ICD-10-CM | POA: Insufficient documentation

## 2017-06-17 DIAGNOSIS — Z951 Presence of aortocoronary bypass graft: Secondary | ICD-10-CM | POA: Diagnosis not present

## 2017-06-17 DIAGNOSIS — R471 Dysarthria and anarthria: Secondary | ICD-10-CM | POA: Insufficient documentation

## 2017-06-17 DIAGNOSIS — R531 Weakness: Secondary | ICD-10-CM | POA: Insufficient documentation

## 2017-06-17 DIAGNOSIS — Z8673 Personal history of transient ischemic attack (TIA), and cerebral infarction without residual deficits: Secondary | ICD-10-CM | POA: Insufficient documentation

## 2017-06-17 DIAGNOSIS — R29898 Other symptoms and signs involving the musculoskeletal system: Secondary | ICD-10-CM

## 2017-06-17 DIAGNOSIS — I5022 Chronic systolic (congestive) heart failure: Secondary | ICD-10-CM | POA: Diagnosis not present

## 2017-06-17 DIAGNOSIS — Z91040 Latex allergy status: Secondary | ICD-10-CM | POA: Diagnosis not present

## 2017-06-17 DIAGNOSIS — R29810 Facial weakness: Secondary | ICD-10-CM | POA: Diagnosis present

## 2017-06-17 DIAGNOSIS — Z85828 Personal history of other malignant neoplasm of skin: Secondary | ICD-10-CM | POA: Insufficient documentation

## 2017-06-17 DIAGNOSIS — R4789 Other speech disturbances: Secondary | ICD-10-CM | POA: Insufficient documentation

## 2017-06-17 LAB — CBC
HEMATOCRIT: 34.3 % — AB (ref 40.0–52.0)
Hemoglobin: 11.2 g/dL — ABNORMAL LOW (ref 13.0–18.0)
MCH: 28 pg (ref 26.0–34.0)
MCHC: 32.5 g/dL (ref 32.0–36.0)
MCV: 86.1 fL (ref 80.0–100.0)
Platelets: 208 10*3/uL (ref 150–440)
RBC: 3.98 MIL/uL — ABNORMAL LOW (ref 4.40–5.90)
RDW: 16.8 % — AB (ref 11.5–14.5)
WBC: 5.5 10*3/uL (ref 3.8–10.6)

## 2017-06-17 LAB — PROTIME-INR
INR: 1.22
PROTHROMBIN TIME: 15.3 s — AB (ref 11.4–15.2)

## 2017-06-17 LAB — COMPREHENSIVE METABOLIC PANEL
ALT: 9 U/L — AB (ref 17–63)
AST: 20 U/L (ref 15–41)
Albumin: 2.7 g/dL — ABNORMAL LOW (ref 3.5–5.0)
Alkaline Phosphatase: 53 U/L (ref 38–126)
Anion gap: 4 — ABNORMAL LOW (ref 5–15)
BILIRUBIN TOTAL: 0.5 mg/dL (ref 0.3–1.2)
BUN: 14 mg/dL (ref 6–20)
CO2: 32 mmol/L (ref 22–32)
Calcium: 8.6 mg/dL — ABNORMAL LOW (ref 8.9–10.3)
Chloride: 94 mmol/L — ABNORMAL LOW (ref 101–111)
Glucose, Bld: 104 mg/dL — ABNORMAL HIGH (ref 65–99)
POTASSIUM: 4.2 mmol/L (ref 3.5–5.1)
Sodium: 130 mmol/L — ABNORMAL LOW (ref 135–145)
TOTAL PROTEIN: 5.9 g/dL — AB (ref 6.5–8.1)

## 2017-06-17 LAB — URINALYSIS, COMPLETE (UACMP) WITH MICROSCOPIC
BACTERIA UA: NONE SEEN
Bilirubin Urine: NEGATIVE
Glucose, UA: NEGATIVE mg/dL
Ketones, ur: NEGATIVE mg/dL
Leukocytes, UA: NEGATIVE
Nitrite: NEGATIVE
Protein, ur: NEGATIVE mg/dL
SPECIFIC GRAVITY, URINE: 1.017 (ref 1.005–1.030)
Squamous Epithelial / LPF: NONE SEEN
pH: 6 (ref 5.0–8.0)

## 2017-06-17 LAB — GLUCOSE, CAPILLARY: Glucose-Capillary: 87 mg/dL (ref 65–99)

## 2017-06-17 LAB — TROPONIN I

## 2017-06-17 LAB — APTT: aPTT: 34 seconds (ref 24–36)

## 2017-06-17 MED ORDER — SODIUM CHLORIDE 0.9 % IV SOLN
500.0000 mg | Freq: Once | INTRAVENOUS | Status: AC
  Administered 2017-06-17: 500 mg via INTRAVENOUS
  Filled 2017-06-17: qty 5

## 2017-06-17 MED ORDER — SODIUM CHLORIDE 0.9 % IV BOLUS
1000.0000 mL | Freq: Once | INTRAVENOUS | Status: AC
  Administered 2017-06-17: 1000 mL via INTRAVENOUS

## 2017-06-17 MED ORDER — LEVETIRACETAM 750 MG PO TABS: 750.0000 mg | ORAL_TABLET | Freq: Two times a day (BID) | ORAL | 0 refills | Status: DC

## 2017-06-17 NOTE — ED Notes (Signed)
Patient's discharge and follow up information reviewed with patient and family by ED nursing staff and patient/family given the opportunity to ask questions pertaining to ED visit and discharge plan of care. Patient advised that should symptoms not continue to improve, resolve entirely, or should new symptoms develop then a follow up visit with their PCP or a return visit to the ED may be warranted. Patient verbalized consent and understanding of discharge plan of care including potential need for further evaluation. Patient discharged in stable condition per attending ED physician on duty.

## 2017-06-17 NOTE — ED Provider Notes (Addendum)
[facility] Emergency Department Provider Note  ____________________________________________  Time seen: Approximately 10:24 AM  I have reviewed the triage vital signs and the nursing notes.   HISTORY  Chief Complaint Weakness and Altered Mental Status    HPI [name] [name] is a 84 y.o. male with recent CVA, A. fib on Eliquis, presenting for worsening left facial droop and change in speech.  The patient is unable to give any significant history as he does not answer most questions appropriately.  Per report and after reviewing the chart, the patient was seen here 06/06/17 with new left upper extremity weakness, left facial droop, and visual changes.  He underwent CT and neurologic examination and was discharged home for outpatient management including addition of an aspirin to his Eliquis.  Today, EMS was called for new onset of worsening left facial droop and decreased verbal interaction.  Unfortunately, the patient's family is not here and we do not know the onset of time, nor have a clear picture of his baseline.  Per report, the patient is more verbal than now at baseline and bedbound. [id number]  Past Medical History:  Diagnosis Date  . A-fib (HCC)   . Bradycardia    06/2010  . CAD (coronary artery disease)    nuclear 01/2008, no ischemia, EF 60%, distal anterior scar  . Carotid artery disease (HCC)    doppler 05/2009,  0-39% bilateral / doppler 06/2010 OK  . Edema    Ankle edema August, 2012  . Ejection fraction    EF 45%, echo,05/2009,apical hypo,  ?? posterior and lateral hypo  . Elevated CPK    11/23/08.Marland KitchenMarland Kitchenprobably from debris from popliteal aneurysm  . Fall at home July 2015  . Hx of CABG    1998  . Hyperlipidemia   . Intracranial hemorrhage (HCC)    Two small subependymal bleeds 06/2010,,ASA held  . PAD (peripheral artery disease) (HCC)    Above the knee to below the knee popliteal bypass,, right  //    Doppler,  July, 2013, patent  . Popliteal aneurysm  (HCC)    .repair..11/25/2008  Dr. Darrick Penna  . Prominent abdominal aortic pulsation    doppler normal  . Speech problem    Neurologic event with brief infusion and speech difficulty.... January, 2011.... being assessed by neurology  February, 2011  . Syncope    .remote..no etiology  /  syncope 11/2008.. from acute leg pain  . TIA (transient ischemic attack)    Brief episode of confusion and speech difficulty, January, 2012  /  recurrent confusion speech difficulty September, 2012  . Venous insufficiency    support hose    Patient Active Problem List   Diagnosis Date Noted  . Pressure injury of skin 08/20/2016  . Anemia 03/19/2016  . Seizure (HCC) 10/05/2015  . A-fib (HCC) 10/05/2015  . Chronic systolic (congestive) heart failure (HCC) 09/07/2015  . Altered mental status 08/18/2015  . CAD in native artery 08/07/2015  . Arterial disease (HCC) 08/07/2015  . HLD (hyperlipidemia) 08/07/2015  . Temporary cerebral vascular dysfunction 08/07/2015  . Chronic venous insufficiency 08/07/2015  . H/O neoplasm 08/22/2014  . H/O malignant neoplasm of skin 08/22/2014  . 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 Peripheral vascular disease (HCC) 02/28/2014  . Postsurgical aortocoronary bypass status 02/28/2014  . Syncope and collapse 02/28/2014  . 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 PAD (peripheral artery disease) (HCC)   . TIA (transient ischemic  attack)   . Edema   . Hyperlipidemia   . Syncope   . CAD (coronary artery disease)   . Venous insufficiency   . Carotid artery disease (HCC)   . Elevated CPK   . Popliteal aneurysm (HCC)   . Hx of CABG   . Intracranial hemorrhage [facility])     Past Surgical History:  Procedure Laterality Date  . CORONARY ARTERY BYPASS GRAFT  1998  . popliteal bypass  with reversed greater saphenous vein from right leg.      Current Outpatient Rx  . Order #:  [id number] Class: Print  . Order #: [id number] Class: Historical Med  . Order #: [id number] Class: Normal  . Order #: [id number] Class: Historical Med  . Order #: [id number] Class: Historical Med  . Order #: [id number] Class: OTC  . Order #: [id number] Class: Normal  . Order #: [id number] Class: Normal  . Order #: [id number] Class: Normal  . Order #: [id number] Class: Normal  . Order #: [id number] Class: Normal  . Order #: [id number] Class: Historical Med  . Order #: [id number] Class: Normal  . Order #: [id number] Class: No Print  . Order #: [id number] Class: No Print  . Order #: [id number] Class: Historical Med    Allergies Penicillins; Latex; and Sulfonamide derivatives  Family History  Problem Relation Age of Onset  . Stroke Mother   . Diabetes Mother   . Diabetes Sister     Social History Social History   Tobacco Use  . Smoking status: Former Smoker    Years: 20.00    Types: Cigarettes    Last attempt to quit: 09/12/1946    Years since quitting: 70.8  . Smokeless tobacco: Never Used  . Tobacco comment: quit appox 50 yrs ago  Substance Use Topics  . Alcohol use: No  . Drug use: No    Review of Systems Unable to obtain due to patient mental status.  ____________________________________________   PHYSICAL EXAM:  VITAL SIGNS: ED Triage Vitals  Enc Vitals Group     BP 06/17/17 1018 132/70     Pulse Rate 06/17/17 1018 75     Resp 06/17/17 1018 18     Temp 06/17/17 1018 97.7 F (36.5 C)     Temp Source 06/17/17 1018 Axillary     SpO2 06/17/17 1015 97 %     Weight 06/17/17 1018 180 lb (81.6 kg)     Height 06/17/17 1018 5\' 7"  (1.702 m)     Head Circumference --      Peak Flow --      Pain Score --      Pain Loc --      Pain Edu? --      Excl. in GC? --     Constitutional: The patient is alert and answers some questions with clear speech.  However, he does not answer most questions.  He is protecting his airway. Eyes: Conjunctivae are normal.  EOMI. PERRLA.  No scleral  icterus. Head: Atraumatic. Nose: No congestion/rhinnorhea. Mouth/Throat: Mucous membranes are mildly dry..  Neck: No stridor.  Supple.  No meningismus. Cardiovascular: Normal rate, regular rhythm. No murmurs, rubs or gallops.  Respiratory: Normal respiratory effort.  No accessory muscle use or retractions. Lungs CTAB.  No wheezes, rales or ronchi. Gastrointestinal: Soft, nontender and nondistended.  No guarding or rebound.  No peritoneal signs. Musculoskeletal: No LE edema. No ttp in the calves or palpable cords.  Negative Homan's sign. Neurologic: the patient's exam is limited due to his inability to comply.  On my examination, the patient does have a  left facial droop.  He appears to have extraocular movements which are intact and his pupils are round and reactive bilaterally, symmetric.  His speech is clear although he does not answer questions appropriately.  He does know his name but does not know where he is or the year.  He is unable to follow the command of moving his legs or gripping my hands, but does withdraw to pain.  I am unable to complete a sensory or cerebellar exam on this patient. Skin:  Skin is warm, dry and intact. No rash noted. Psychiatric: Unable to assess ____________________________________________   LABS (all labs ordered are listed, but only abnormal results are displayed)  Labs Reviewed  CBC - Abnormal; Notable for the following components:      Result Value   RBC 3.98 (*)    Hemoglobin 11.2 (*)    HCT 34.3 (*)    RDW 16.8 (*)    All other components within normal limits  COMPREHENSIVE METABOLIC PANEL - Abnormal; Notable for the following components:   Sodium 130 (*)    Chloride 94 (*)    Glucose, Bld 104 (*)    Creatinine, Ser <0.30 (*)    Calcium 8.6 (*)    Total Protein 5.9 (*)    Albumin 2.7 (*)    ALT 9 (*)    Anion gap 4 (*)    All other components within normal limits  PROTIME-INR - Abnormal; Notable for the following components:   Prothrombin  Time 15.3 (*)    All other components within normal limits  GLUCOSE, CAPILLARY  APTT  TROPONIN I  URINALYSIS, COMPLETE (UACMP) WITH MICROSCOPIC   ____________________________________________  EKG  ED ECG REPORT I, Rockne Menghini, the attending physician, personally viewed and interpreted this ECG.   Date: 06/17/2017  EKG Time: 1028  Rate: 75  Rhythm: normal sinus rhythm  Axis: normal  Intervals:none  ST&T Change: no STEMI  ____________________________________________  RADIOLOGY  Ct Head Wo Contrast  Result Date: 06/17/2017 CLINICAL DATA:  Left side facial droop.  Changes in speech. EXAM: CT HEAD WITHOUT CONTRAST TECHNIQUE: Contiguous axial images were obtained from the base of the skull through the vertex without intravenous contrast. COMPARISON:  06/10/2017 FINDINGS: Brain: There is atrophy and chronic small vessel disease changes. Stable chronic ventriculomegaly. No acute intracranial abnormality. Specifically, no hemorrhage, mass lesion, acute infarction, or significant intracranial injury. Vascular: No hyperdense vessel or unexpected calcification. Skull: No acute calvarial abnormality. Sinuses/Orbits: Visualized paranasal sinuses and mastoids clear. Orbital soft tissues unremarkable. Other: None IMPRESSION: Diffuse atrophy and extensive chronic small vessel disease. Stable chronic ventriculomegaly/normal pressure hydrocephalus. No acute intracranial abnormality. Electronically Signed   By: Charlett Nose M.D.   On: 06/17/2017 10:49    ____________________________________________   PROCEDURES  Procedure(s) performed: None  Procedures  Critical Care performed: No ____________________________________________   INITIAL IMPRESSION / ASSESSMENT AND PLAN / ED COURSE  Pertinent labs & imaging results that were available during my care of the patient were reviewed by me and considered in my medical decision making (see chart for details).  84 y.o. male with A. fib on  Eliquis, recent CVA with left-sided symptoms, presenting for worsening left facial droop and change in verbal communication.  Overall, the patient is hemodynamically stable.  He does have some focal neurologic findings.  On his prior exam, the patient had dysarthric but fluent speech.  He was unable to lift either lower extremity from the bed, which is consistent with my exam today.  He did  have 5 out of 5 right upper extremity strength and 4 out of 5 left upper extremity strength, which is different on my exam.  Given the patient's age, anticoagulation, he is not a candidate for TPA.  A code stroke is not called because the onset of symptoms is unknown and because he is not a candidate for an acute intervention.  ----------------------------------------- 11:34 AM on 06/17/2017 -----------------------------------------  I have spoken to the patient's daughters, including his daughter who is the POA, and they are concerned about a significant change in his ability to verbalize.  At baseline, he is not oriented, but he is generally able to carry on a conversation and this is significantly different today.  They understand the results of his testing so far.  At this time, the plan is to follow up on a urinalysis, perform an MRI, and do a bedside swallow evaluation with thickened liquids which is what he takes at home.  The results of these interventions and studies will influence his final disposition; decisions will be made in conjunction with the family and the patient.  Dr. Manson PasseyBrown is aware of the patient's presentation and current state as well as plan, and will continue to make decisions with the family and their wishes based on the results.   ____________________________________________  FINAL CLINICAL IMPRESSION(S) / ED DIAGNOSES  Final diagnoses:  Dysarthria  Upper extremity weakness         NEW MEDICATIONS STARTED DURING THIS VISIT:  New Prescriptions   No medications on file       Rockne MenghiniNorman, Anne-Caroline, MD 06/17/17 1134    Rockne MenghiniNorman, Anne-Caroline, MD 06/17/17 1135

## 2017-06-17 NOTE — ED Triage Notes (Signed)
Pt presents to ED via [facility]. Per EMS pt was seen on April 5 and dx with stroke, pt takes Eliquis and ASA. Per EMS pt's family reports more droop to L side of face (from prior stroke), changes in speech (EMS reports per family pt is somewhat able to communicate normally, however today all he answer with is "yeah"). EMS reports pt is bed bound at baseline.

## 2017-06-17 NOTE — ED Notes (Addendum)
Pt cleaned of stool and urine. Turned onto R side. Dietary called for thickening powder.

## 2017-06-17 NOTE — ED Notes (Signed)
Pt positioned on L side after returning from MRI

## 2017-06-17 NOTE — ED Notes (Signed)
ED Provider at bedside. 

## 2017-06-17 NOTE — ED Notes (Signed)
Patient drank two sips of thickened apple juice with no difficulty.  No coughing or sputtering.  Patient has good head control and able to lick his lips.  Patient is in no obvious distress at this time.

## 2017-06-17 NOTE — ED Notes (Signed)
Pt's family at bedside at this time

## 2017-06-17 NOTE — ED Notes (Signed)
EMS present to transport pt back home. Family at bedside.

## 2017-07-02 DEATH — deceased

## 2017-07-04 NOTE — Telephone Encounter (Signed)
Insurance calling and stating if they receive ofc notes from 05/26/17 it would possibly be approved, please fax [phone]

## 2017-07-04 NOTE — Telephone Encounter (Signed)
Notes faxed.

## 2017-07-08 ENCOUNTER — Encounter: Payer: Self-pay | Admitting: Family Medicine

## 2017-07-14 ENCOUNTER — Other Ambulatory Visit: Payer: Self-pay | Admitting: Family Medicine

## 2017-07-16 ENCOUNTER — Telehealth: Payer: Self-pay | Admitting: Family Medicine

## 2017-07-16 DIAGNOSIS — R0689 Other abnormalities of breathing: Secondary | ICD-10-CM

## 2017-07-16 NOTE — Telephone Encounter (Signed)
If you could let me know the type of xray that will be needed I will call the [facility]. I think they do take verbals. If not I will get fax number when I speak to them

## 2017-07-16 NOTE — Telephone Encounter (Signed)
Message left on Angel's callback number.

## 2017-07-16 NOTE — Telephone Encounter (Signed)
Please advise 

## 2017-07-16 NOTE — Telephone Encounter (Signed)
Spoke with [employer]. They do not do the [facility] X-[facility]. She stated they would need an order, and they normally use [facility]. Phone number: [phone]

## 2017-07-16 NOTE — Telephone Encounter (Signed)
Copied from CRM [id number]. Topic: General - Other >> Jul 16, 2017 11:10 AM Debroah Loop wrote: Reason for CRM: Lawanna Kobus, RN with [facility] reporting that patient has bronchi in all of his lobes and need an order for a mobile x-ray. Patient is unable to leave his home.

## 2017-07-16 NOTE — Telephone Encounter (Signed)
OK for verbal order for x-ray

## 2017-07-16 NOTE — Telephone Encounter (Signed)
[name] [name], how do we need to proceed with this?

## 2017-07-17 NOTE — Telephone Encounter (Signed)
Will need order to fax. Please place

## 2017-07-17 NOTE — Telephone Encounter (Signed)
  Daughter Luster Landsberg, called   For update, asking for call from [place] at  [phone]

## 2017-07-17 NOTE — Telephone Encounter (Signed)
Order in.

## 2017-07-17 NOTE — Telephone Encounter (Signed)
PA and lateral if they can do it, otherwise whatever they can do

## 2017-07-17 NOTE — Telephone Encounter (Signed)
Order printed and faxed along w/ demographics to [facility].

## 2017-07-18 NOTE — Telephone Encounter (Signed)
Results reviewed. Normal. No sign of any acute illness. Relayed to patient's daugher

## 2017-07-18 NOTE — Telephone Encounter (Signed)
Renee, daughter, calling to check on xray results. Please call when ready at [phone].

## 2017-07-18 NOTE — Telephone Encounter (Signed)
No X ray results have been seen yet.

## 2017-07-25 ENCOUNTER — Other Ambulatory Visit: Payer: Medicare Other

## 2017-07-25 ENCOUNTER — Telehealth: Payer: Self-pay | Admitting: Family Medicine

## 2017-07-25 DIAGNOSIS — R300 Dysuria: Secondary | ICD-10-CM

## 2017-07-25 LAB — UA/M W/RFLX CULTURE, ROUTINE
BILIRUBIN UA: NEGATIVE
GLUCOSE, UA: NEGATIVE
KETONES UA: NEGATIVE
Leukocytes, UA: NEGATIVE
Nitrite, UA: NEGATIVE
PROTEIN UA: NEGATIVE
RBC, UA: NEGATIVE
Specific Gravity, UA: 1.015 (ref 1.005–1.030)
UUROB: 0.2 mg/dL (ref 0.2–1.0)
pH, UA: 6.5 (ref 5.0–7.5)

## 2017-07-25 NOTE — Telephone Encounter (Signed)
Spoke with Mrs [name] [name] and she stated that she would try to get a sample to the office.

## 2017-07-25 NOTE — Telephone Encounter (Signed)
Verbal that urine will not be cultured do to clear dip. Patient's daughter notified.

## 2017-07-25 NOTE — Telephone Encounter (Signed)
Spoke with Luster Landsberg and she stated that they were unable to get a sample.

## 2017-07-25 NOTE — Telephone Encounter (Signed)
Order is in in case they can get it here.

## 2017-07-25 NOTE — Telephone Encounter (Signed)
Are you sending it for a culture

## 2017-07-25 NOTE — Telephone Encounter (Signed)
They can come in and drop off a sample

## 2017-07-25 NOTE — Telephone Encounter (Signed)
Please let them know that his urine was normal. No sign of any infection. Thanks!

## 2017-07-25 NOTE — Telephone Encounter (Signed)
Copied from CRM [id number]. Topic: Quick Communication - See Telephone Encounter >> Jul 25, 2017  9:40 AM Floria Raveling A wrote: CRM for notification. See Telephone encounter for: 07/25/17. Daugher called in and stated that the care giver called her and told her that pt may have a uti.  She would like to know if something could be call in for him before the weekend?  Pt is is bed bound and not able to come in   Pharmacy - [facility] number [phone]

## 2017-08-03 IMAGING — US US EXTREM LOW VENOUS BILAT
1 series · 12 of 24 positions shown · non-contrast
Comparison: None.

CLINICAL DATA: Bilateral lower extremity edema. Chronic. Symptoms
for 2 years.



[Series 1: us extrem low venous bilat · 0.09mm/px · 105 acquisitions, 12 frames shown]
[im 5/105]
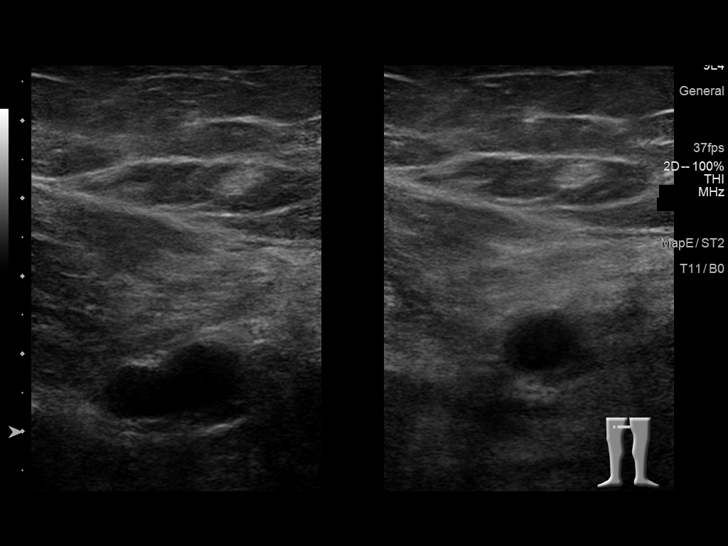
[im 14/105]
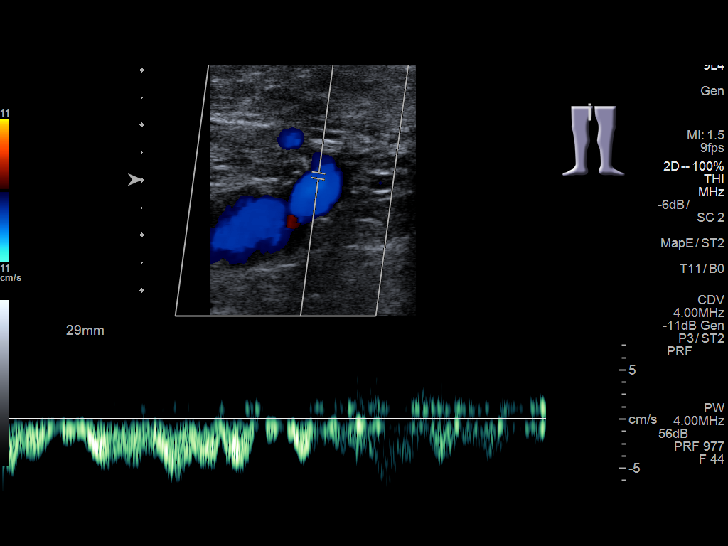
[im 23/105]
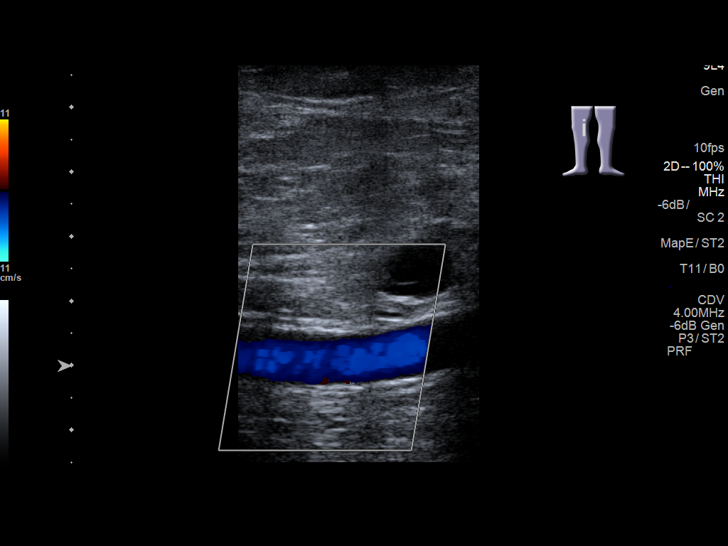
[im 32/105]
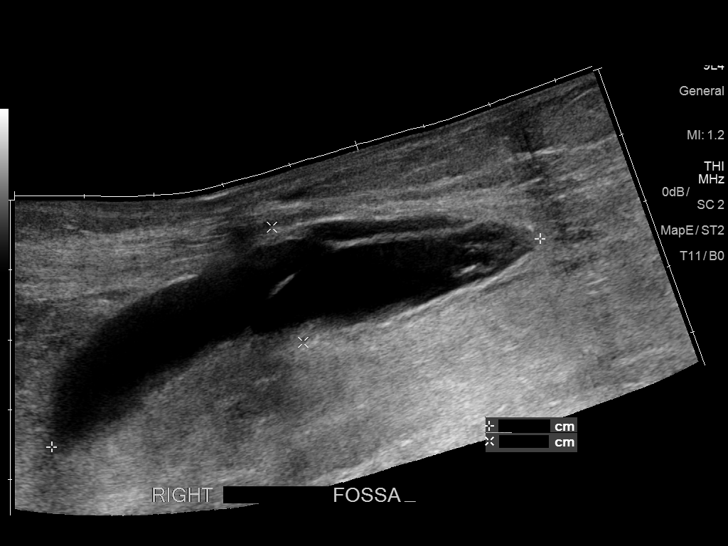
[im 41/105]
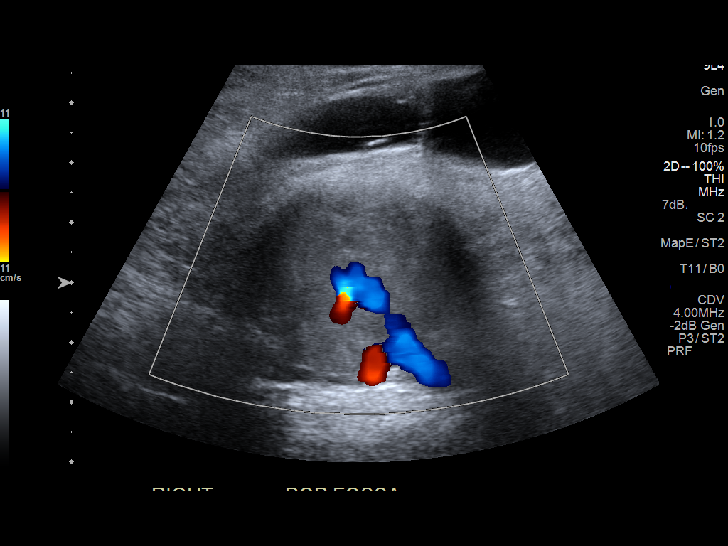
[im 50/105]
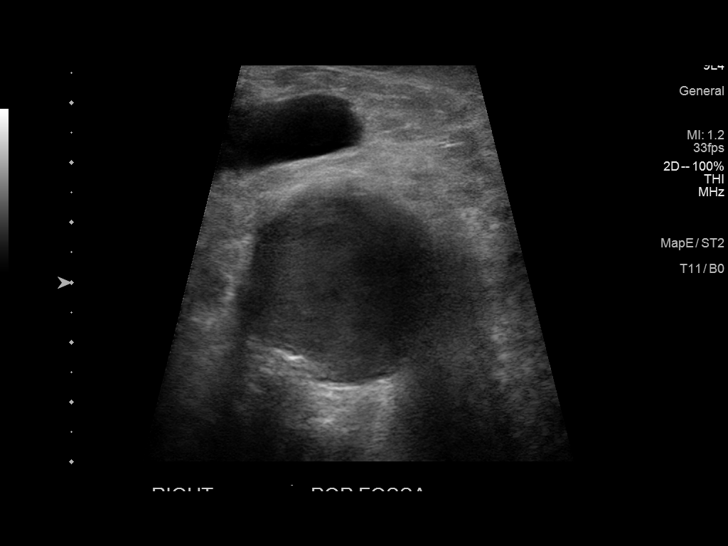
[im 64/105]
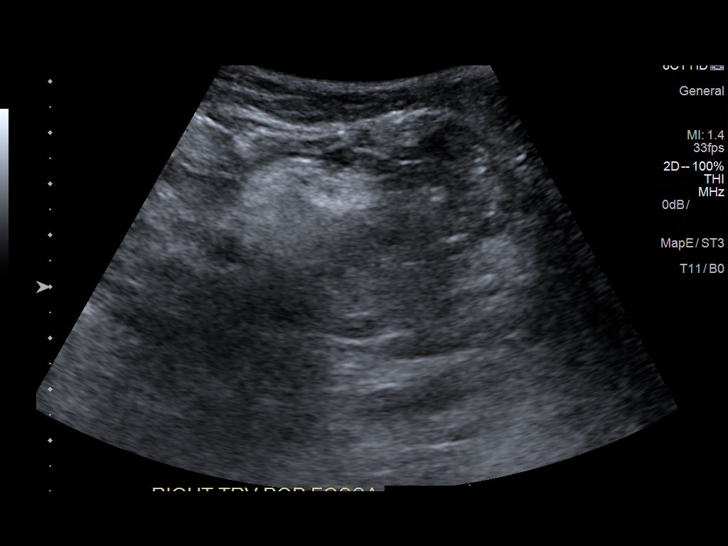
[im 68/105]
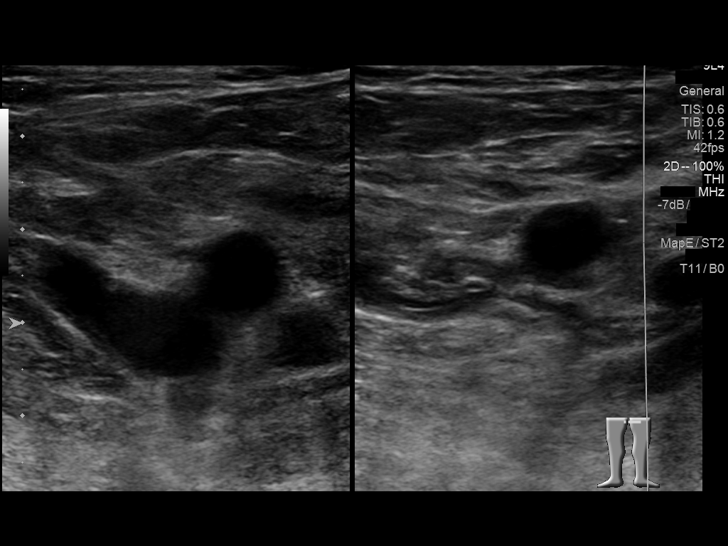
[im 77/105]
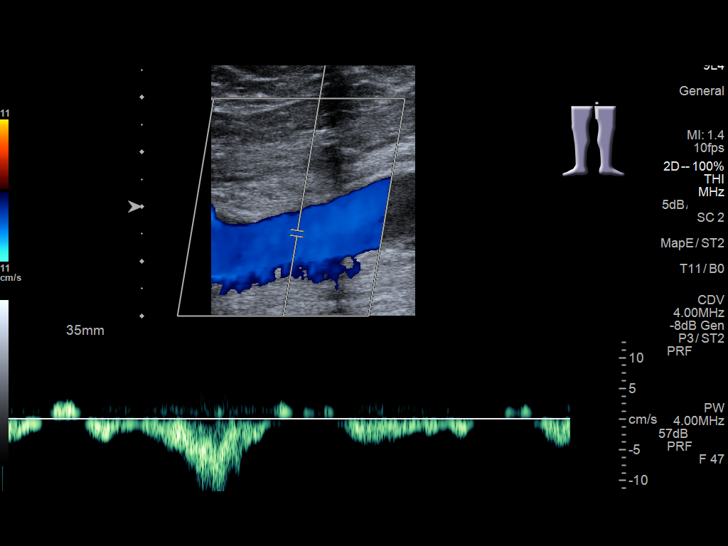
[im 86/105]
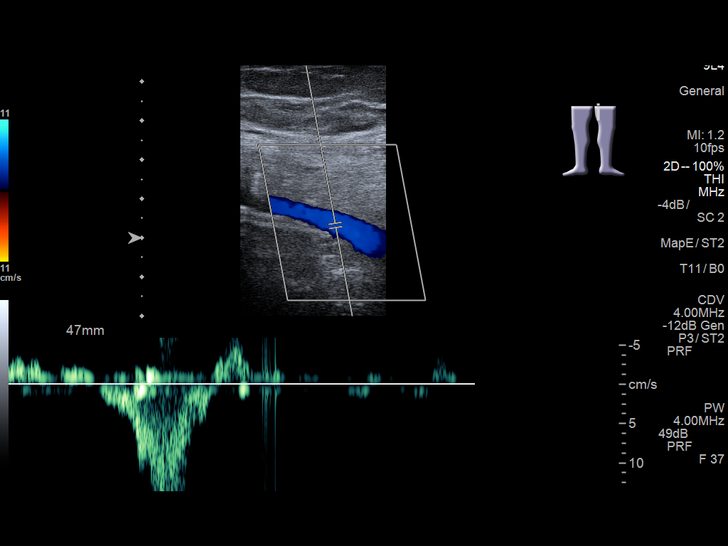
[im 95/105]
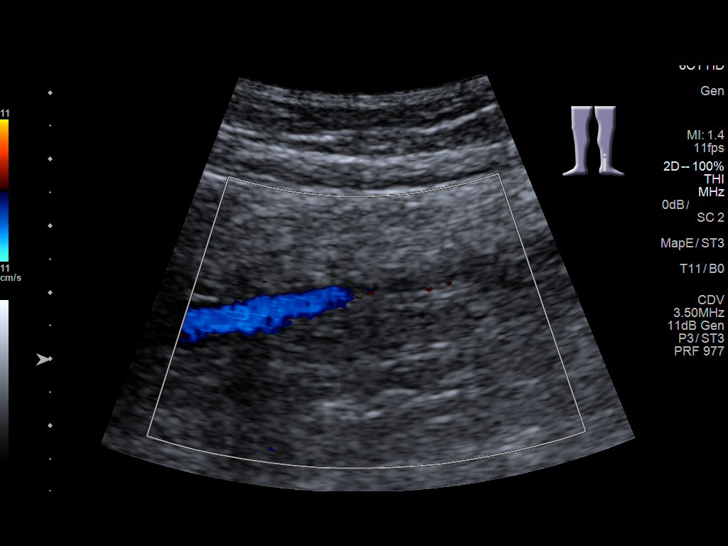
[im 105/105]
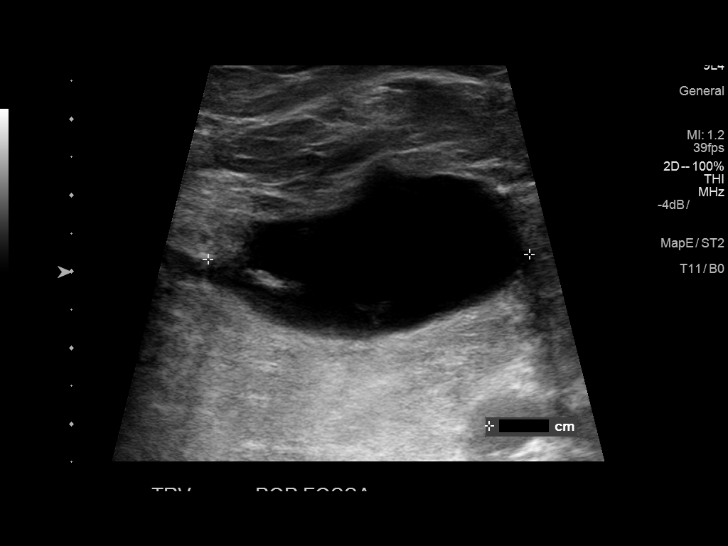

[12 of 24 positions shown; findings below may reference images not displayed]

FINDINGS: RIGHT LOWER EXTREMITY

Common Femoral Vein: No evidence of thrombus. Normal
compressibility, respiratory phasicity and response to augmentation.

Saphenofemoral Junction: No evidence of thrombus. Normal
compressibility and flow on color Doppler imaging.

Profunda Femoral Vein: No evidence of thrombus. Normal
compressibility and flow on color Doppler imaging.

Femoral Vein: No evidence of thrombus. Normal compressibility,
respiratory phasicity and response to augmentation.

Popliteal Vein: No evidence of thrombus. Normal compressibility,
respiratory phasicity and response to augmentation.

Calf Veins: No evidence of thrombus. Normal compressibility and flow
on color Doppler imaging.

Superficial Great Saphenous Vein: No evidence of thrombus. Normal
compressibility and flow on color Doppler imaging.

Venous Reflux:  None.

Other Findings: Elongated mildly complex cystic structure in the
right popliteal fossa consistent with popliteal cyst. This measures
about 7.6 x 1.7 x 3.2 cm. Additionally in the right popliteal fossa,
there is a solid-appearing hypoechoic lesion in the right popliteal
fossa measuring 4.9 x 4.8 x 3.6 cm. Color flow Doppler demonstrates
central internal flow in this lesion. Differential diagnosis would
include an enlarged lymph node or a solid mass. Suggest MRI for
further evaluation.

LEFT LOWER EXTREMITY

Common Femoral Vein: No evidence of thrombus. Normal
compressibility, respiratory phasicity and response to augmentation.

Saphenofemoral Junction: No evidence of thrombus. Normal
compressibility and flow on color Doppler imaging.

Profunda Femoral Vein: No evidence of thrombus. Normal
compressibility and flow on color Doppler imaging.

Femoral Vein: No evidence of thrombus. Normal compressibility,
respiratory phasicity and response to augmentation.

Popliteal Vein: No evidence of thrombus. Normal compressibility,
respiratory phasicity and response to augmentation.

Calf Veins: No evidence of thrombus. Normal compressibility and flow
on color Doppler imaging.

Superficial Great Saphenous Vein: No evidence of thrombus. Normal
compressibility and flow on color Doppler imaging.

Venous Reflux:  None.

Other Findings: Elongated mildly complex cystic structure in the
left popliteal fossa consistent with popliteal cyst. This measures
about 7.8 x 1.5 x 4.2 cm.
IMPRESSION: No evidence of deep venous thrombosis.

Bilateral popliteal cysts.

Solid hypoechoic mass in the right popliteal fossa with flow on
color flow Doppler imaging. Suggest enlarged abnormal lymph node or
solid mass. Consider MRI for further evaluation.

## 2017-08-22 ENCOUNTER — Telehealth: Payer: Self-pay | Admitting: Family Medicine

## 2017-08-22 MED ORDER — AZITHROMYCIN 250 MG PO TABS: ORAL_TABLET | ORAL | 0 refills | Status: DC

## 2017-08-22 NOTE — Telephone Encounter (Signed)
Please advise.      Copied from CRM [id number]. Topic: Inquiry >> Aug 22, 2017  8:06 AM Yvonna Alanisobinson, Andra M wrote: Reason for CRM: Patient's daughter Angelique BlonderDenise [phone]) called stating that her day has symptoms of Bronchitis. Patient's daughter wants Dr. Dossie Arbourrissman to order a Z-Pack for the patient.       Thank You!!!

## 2017-08-22 NOTE — Telephone Encounter (Signed)
Rx sent to his pharmacy

## 2017-08-22 NOTE — Telephone Encounter (Signed)
Patient's daughter notified that medication was sent in for her dad.

## 2017-09-03 ENCOUNTER — Ambulatory Visit (INDEPENDENT_AMBULATORY_CARE_PROVIDER_SITE_OTHER): Payer: Medicare Other | Admitting: Neurology

## 2017-09-03 ENCOUNTER — Other Ambulatory Visit: Payer: Self-pay

## 2017-09-03 ENCOUNTER — Encounter: Payer: Self-pay | Admitting: Neurology

## 2017-09-03 ENCOUNTER — Encounter

## 2017-09-03 VITALS — BP 110/59 | HR 76 | Resp 96

## 2017-09-03 DIAGNOSIS — R569 Unspecified convulsions: Secondary | ICD-10-CM | POA: Diagnosis not present

## 2017-09-03 DIAGNOSIS — R55 Syncope and collapse: Secondary | ICD-10-CM | POA: Diagnosis not present

## 2017-09-03 MED ORDER — LEVETIRACETAM 500 MG PO TABS: ORAL_TABLET | ORAL | 3 refills | Status: AC

## 2017-09-03 NOTE — Patient Instructions (Signed)
1. Try giving Keppra 500mg : take 1 tab in AM, 2 tabs in PM. If no change in daytime drowsiness, can resume 1.5 tabs twice a day dosing  2. Continue all other medications  3. Follow-up in 1 year, call for any changes

## 2017-09-03 NOTE — Progress Notes (Signed)
NEUROLOGY FOLLOW UP OFFICE NOTE  [name] [name] [id number] 1921-05-09  HISTORY OF PRESENT ILLNESS: I had the pleasure of seeing [name] [name] in follow-up in the neurology clinic on 09/03/2017. He is again accompanied by his daughter who helps supplement the history. The patient was last seen a year ago after an episode of unresponsiveness with body shaking after a bowel movement in August 2017. Etiology unclear, convulsive syncope versus seizure, he was started on Keppra in the ER. He was having drowsiness on the medication and family reduced dose to 250mg  in AM, 500mg  in PM. He was back in the ER in July 2018 for recurrent syncopal episodes. Workup at that time included an EEG showed posterior background slowing, no epileptiform discharges. MRI brain did not show any acute changes. There was severe ventriculomegaly with disproportionate sulcal effacement at the convexities, concerning for severe chronic normal pressure hydrocephalus. There were  confluent white matter changes consistent with moderate to severe chronic microvascular disease. Due to concern for seizures, Keppra dose was increased to 250mg  in AM, 750mg  in PM. He has been bedbound the past year, his daughter reports that each time he is vertical, his BP would drop and he would pass out. He was in the ER twice in April. On 06/06/17, he was brought for difficulty seeing out of his left eye, left arm weakness, confusion. Head CT no acute changes. Aspirin was added to Eliquis. He was back in the ER on 06/17/17 for worsening left facial droop and change in speech, decreased verbal interaction. MRI brain without contrast did not show any acute chagnes, there was moderate to advance atrophy with ventricular enlargement compatible with chronic hydrocephalus. With unremarkable MRI brain, symptoms were attributed to seizure and Keppra dose was increased to 750mg  BID. His daughter reports that he is more drowsy but no further spells since April 2019. He has  24/7 care at home.  HPI 01/15/2016: This is a pleasant 84 yo RH man who had an episode of loss of consciousness with shaking last 10/05/15. He has no recollection of the event, his daughter provides the details but was not present during the event. She reports that he had a bowel movement and as they were moving him back to the bed with hoyer lift, he had what appeared to be a seizure. Her sister reported that they sat him down, he started to lean over and backwards, then started having body shaking with eyes rolled back. He recovered fast after they lay him down, he was pale and "kind of out of it" briefly. No associated tongue bite or urinary incontinence. They report he has had episodes of his blood pressure dropping, and had been at the rehab recently when he passed out in the toilet having a bowel movement and diagnosed with vasovagal event.   He was admitted to [facility] in June 2017 for confusion and gazing up forward to the ceiling. His daughter states that his eyes would not track, as if he could not see or recognize things. He had an MRI brain with and without contrast, which did not show any acute changes. There was ventricular prominence without acute ventriculomegaly, patchy periventricular FLAIR abnormalities not significantly changed from 2012, regressed hemosiderin in the right lateral ventricle since the 2012 comparison, intracranial MRA negative. He had a stroke workup with carotid dopplers showing less than 50% stenosis on the left, widely patent right ICA. Echo showed EF 45-50%, hypokinesis of the anterior myocardium, left atrium mildly dilated. He had an EEG  which showed mild diffuse slowing from drowsiness, could not rule out general cerebral disturbance. During his stay, he had an episode of paroxysmal atrial fibrillation with RVR and started on amiodarone and Eliquis. He was noted to have visual deficits in the left eye in the temporal field and was advised to see ophtho. His daughter reports  ophtho visit showed normal vision. She states that it took 2-3 days before he started seeing simple images such as the chair in front of him. He was discharged to rehab where he had the vasovagal event while having a BM, then 1-2 days later when he was back home with 24/7 care, the event noted above with convulsive activity occurred. Head CT did not show any acute changes. EEG was normal. He was discharged home on Keppra 500mg  BID. His daughter denies any further syncopal episodes since then. She has noticed he is drowsy on the Keppra.   He had a normal birth and early development.  There is no history of febrile convulsions, CNS infections such as meningitis/encephalitis, significant traumatic brain injury, neurosurgical procedures, or family history of seizures.  PAST MEDICAL HISTORY: Past Medical History:  Diagnosis Date  . A-fib (HCC)   . Bradycardia    06/2010  . CAD (coronary artery disease)    nuclear 01/2008, no ischemia, EF 60%, distal anterior scar  . Carotid artery disease (HCC)    doppler 05/2009,  0-39% bilateral / doppler 06/2010 [place]  . Edema    Ankle edema August, 2012  . Ejection fraction    EF 45%, echo,05/2009,apical hypo,  ?? posterior and lateral hypo  . Elevated CPK    11/23/08.Marland KitchenMarland Kitchenprobably from debris from popliteal aneurysm  . Fall at home July 2015  . Hx of CABG    1998  . Hyperlipidemia   . Intracranial hemorrhage (HCC)    Two small subependymal bleeds 06/2010,,ASA held  . PAD (peripheral artery disease) (HCC)    Above the knee to below the knee popliteal bypass,, right  //    Doppler,  July, 2013, patent  . Popliteal aneurysm (HCC)    .repair..11/25/2008  Dr. Darrick Penna  . Prominent abdominal aortic pulsation    doppler normal  . Speech problem    Neurologic event with brief infusion and speech difficulty.... January, 2011.... being assessed by neurology  February, 2011  . Syncope    .remote..no etiology  /  syncope 11/2008.. from acute leg pain  . TIA (transient  ischemic attack)    Brief episode of confusion and speech difficulty, January, 2012  /  recurrent confusion speech difficulty September, 2012  . Venous insufficiency    support hose    MEDICATIONS: Current Outpatient Medications on File Prior to Visit  Medication Sig Dispense Refill  . acetaminophen (TYLENOL) 325 MG tablet Take 2 tablets (650 mg total) by mouth every 6 (six) hours as needed for mild pain (or Fever >/= 101).    Marland Kitchen albuterol (PROVENTIL HFA;VENTOLIN HFA) 108 (90 Base) MCG/ACT inhaler Inhale 2 puffs every 6 (six) hours as needed into the lungs for wheezing or shortness of breath. 1 Inhaler 2  . albuterol (PROVENTIL) (2.5 MG/3ML) 0.083% nebulizer solution USE 1 VIAL VIA NEBULIZER EVERY 6 HOURS AS NEEDED FOR WHEEZING OR SHORTNESS OF BREATH 150 mL 6  . apixaban (ELIQUIS) 5 MG TABS tablet Take 1 tablet (5 mg total) by mouth 2 (two) times daily. 180 tablet 4  . azithromycin (ZITHROMAX) 250 MG tablet 2 now then 1 a day 6 tablet 0  .  benzonatate (TESSALON) 100 MG capsule TAKE ONE CAPSULE BY MOUTH EVERY 8 HOURS AS NEEDED COUGH (Patient not taking: Reported on 06/17/2017) 90 capsule 0  . calcium carbonate (TUMS - DOSED IN MG ELEMENTAL CALCIUM) 500 MG chewable tablet Chew 2 tablets by mouth daily with supper.     . docusate sodium (COLACE) 100 MG capsule Take 100 mg by mouth daily as needed for mild constipation.     . fluticasone (FLONASE) 50 MCG/ACT nasal spray Place 1 spray into both nostrils daily. (Patient taking differently: Place 1 spray into both nostrils daily as needed for allergies. )  2  . levETIRAcetam (KEPPRA) 500 MG tablet Take 1/2 tab in AM, 1.5 tabs in PM (Patient taking differently: Take 250-750 mg by mouth 2 (two) times daily. 250MG - AM, 750MG - PM) 90 tablet 11  . levETIRAcetam (KEPPRA) 750 MG tablet Take 1 tablet (750 mg total) by mouth 2 (two) times daily. 60 tablet 0  . Multiple Vitamin (MULTIVITAMIN WITH MINERALS) TABS tablet Take 1 tablet by mouth daily with breakfast.      . polyethylene glycol (MIRALAX / GLYCOLAX) packet Take 17 g by mouth daily as needed for mild constipation.     . Vitamin D, Cholecalciferol, 1000 units TABS Take 2,000 tablets by mouth daily.      No current facility-administered medications on file prior to visit.     ALLERGIES: Allergies  Allergen Reactions  . Penicillins Hives    Has patient had a PCN reaction causing immediate rash, facial/tongue/throat swelling, SOB or lightheadedness with hypotension: no Has patient had a PCN reaction causing severe rash involving mucus membranes or skin necrosis: no Has patient had a PCN reaction that required hospitalization no Has patient had a PCN reaction occurring within the last 10 years: no If all of the above answers are "NO", then may proceed with Cephalosporin use.   . Latex Rash  . Sulfonamide Derivatives Rash    FAMILY HISTORY: Family History  Problem Relation Age of Onset  . Stroke Mother   . Diabetes Mother   . Diabetes Sister     SOCIAL HISTORY: Social History   Socioeconomic History  . Marital status: Married    Spouse name: Not on file  . Number of children: Not on file  . Years of education: Not on file  . Highest education level: Not on file  Occupational History  . Occupation: retired  Engineer, productionocial Needs  . Financial resource strain: Not on file  . Food insecurity:    Worry: Not on file    Inability: Not on file  . Transportation needs:    Medical: Not on file    Non-medical: Not on file  Tobacco Use  . Smoking status: Former Smoker    Years: 20.00    Types: Cigarettes    Last attempt to quit: 09/12/1946    Years since quitting: 71.0  . Smokeless tobacco: Never Used  . Tobacco comment: quit appox 50 yrs ago  Substance and Sexual Activity  . Alcohol use: No  . Drug use: No  . Sexual activity: Not on file  Lifestyle  . Physical activity:    Days per week: Not on file    Minutes per session: Not on file  . Stress: Not on file  Relationships  .  Social connections:    Talks on phone: Not on file    Gets together: Not on file    Attends religious service: Not on file    Active member of club  or organization: Not on file    Attends meetings of clubs or organizations: Not on file    Relationship status: Not on file  . Intimate partner violence:    Fear of current or ex partner: Not on file    Emotionally abused: Not on file    Physically abused: Not on file    Forced sexual activity: Not on file  Other Topics Concern  . Not on file  Social History Narrative  . Not on file    REVIEW OF SYSTEMS difficult to obtain, patient is very hard of hearing  PHYSICAL EXAM: Vitals:   09/03/17 1459  BP: (!) 110/59  Pulse: 76  Resp: (!) 96   General: No acute distress, lying on a stretcher. Drowsy but arousable and says "I recognize you." Follows simple commands, hard of hearing Head:  Normocephalic/atraumatic Neck: supple, no paraspinal tenderness, full range of motion Heart:  Regular rate and rhythm Lungs:  Clear to auscultation bilaterally Back: No paraspinal tenderness Skin/Extremities: No rash, no edema Neurological Exam: drowsy, arousable to follow simple commands, Cranial nerves: Pupils equal, round, reactive to light. Extraocular movements intact with no nystagmus. Visual fields full. Facial sensation intact. No facial asymmetry noted today. Tongue, uvula, palate midline.  Motor: Bulk and tone normal, muscle strength at least 4-/5 on both UE, at least 3/5 on both LE. No pronator drift.  Sensation to light touch intact.  No extinction to double simultaneous stimulation.  Deep tendon reflexes +1 throughout.  Finger to nose testing intact.  Gait not tested.  IMPRESSION: This is a pleasant 84 yo RH man with a history of hyperlipidemia, CAD s/p CABG, atrial fibrillation now on Eliquis, who presented for recurrent syncope. He is now bedbound due to syncope each time he is in a vertical position. He was in the ER twice in April for  altered mental status, left facial droop. MRI brain no acute changes, symptoms attributed to seizure and Keppra dose increased to 750mg  BID. He is more drowsy on higher dose, we discussed taking a higher dose at bedtime 500mg  in AM, 1000mg  in PM and see if there is an improvement in drowsiness. Continue all other medications, aspirin had been added to Eliquis. His daughter is very hesitant to make any medication changes as he has been stable for the past 3 months. Follow-up in 1 year, call for any changes.   Thank you for allowing me to participate in his care.  Please do not hesitate to call for any questions or concerns.  The duration of this appointment visit was 25 minutes of face-to-face time with the patient.  Greater than 50% of this time was spent in counseling, explanation of diagnosis, planning of further management, and coordination of care.   Patrcia Dolly, M.D.   CC: Dr. Dossie Arbour

## 2017-09-09 ENCOUNTER — Encounter: Payer: Self-pay | Admitting: Neurology

## 2017-09-19 ENCOUNTER — Telehealth: Payer: Self-pay | Admitting: Family Medicine

## 2017-09-19 ENCOUNTER — Other Ambulatory Visit: Payer: Medicare Other

## 2017-09-19 DIAGNOSIS — R399 Unspecified symptoms and signs involving the genitourinary system: Secondary | ICD-10-CM

## 2017-09-19 LAB — MICROSCOPIC EXAMINATION
RBC, UA: >30 /hpf — AB (ref 0–2)
WBC, UA: >30 /hpf — AB (ref 0–5)

## 2017-09-19 LAB — URINE CULTURE, REFLEX

## 2017-09-19 MED ORDER — CIPROFLOXACIN HCL 500 MG PO TABS: 500.0000 mg | ORAL_TABLET | Freq: Two times a day (BID) | ORAL | 0 refills | Status: DC

## 2017-09-19 NOTE — Telephone Encounter (Signed)
Order in.

## 2017-09-19 NOTE — Telephone Encounter (Signed)
Patient's daughter notified.

## 2017-09-19 NOTE — Telephone Encounter (Signed)
Please let his daughter know that he does have a UTI and I've sent an abx to his pharmacy.

## 2017-09-19 NOTE — Telephone Encounter (Signed)
Patients daughter feels patient may have UTI some pink tink to urine.  They are trying to obtain a sample and would like to bring in.  Please advise. Thanks

## 2017-09-22 ENCOUNTER — Other Ambulatory Visit: Payer: Self-pay | Admitting: Family Medicine

## 2017-09-22 LAB — URINE CULTURE

## 2017-09-22 MED ORDER — DOXYCYCLINE HYCLATE 100 MG PO TABS: 100.0000 mg | ORAL_TABLET | Freq: Two times a day (BID) | ORAL | 0 refills | Status: DC

## 2017-09-22 MED ORDER — SULFAMETHOXAZOLE-TRIMETHOPRIM 800-160 MG PO TABS: 1.0000 | ORAL_TABLET | Freq: Two times a day (BID) | ORAL | 0 refills | Status: DC

## 2017-09-22 NOTE — Progress Notes (Signed)
Prescription sent in for UTI °

## 2017-09-23 LAB — UA/M W/RFLX CULTURE, ROUTINE
Bilirubin, UA: NEGATIVE
Glucose, UA: NEGATIVE
Ketones, UA: NEGATIVE
NITRITE UA: POSITIVE — AB
PH UA: 7.5 (ref 5.0–7.5)
Specific Gravity, UA: 1.01 (ref 1.005–1.030)
Urobilinogen, Ur: 0.2 mg/dL (ref 0.2–1.0)

## 2017-10-07 ENCOUNTER — Other Ambulatory Visit: Payer: Self-pay | Admitting: Neurology

## 2017-10-07 DIAGNOSIS — R55 Syncope and collapse: Secondary | ICD-10-CM

## 2017-10-19 ENCOUNTER — Inpatient Hospital Stay
Admission: EM | Admit: 2017-10-19 | Discharge: 2017-10-22 | DRG: 193 | Disposition: A | Discharge status: Home Health | Payer: Medicare Other | Attending: Internal Medicine | Admitting: Internal Medicine

## 2017-10-19 ENCOUNTER — Other Ambulatory Visit: Payer: Self-pay

## 2017-10-19 ENCOUNTER — Emergency Department: Payer: Medicare Other

## 2017-10-19 DIAGNOSIS — Z7982 Long term (current) use of aspirin: Secondary | ICD-10-CM

## 2017-10-19 DIAGNOSIS — Z7401 Bed confinement status: Secondary | ICD-10-CM

## 2017-10-19 DIAGNOSIS — Z87891 Personal history of nicotine dependence: Secondary | ICD-10-CM

## 2017-10-19 DIAGNOSIS — J189 Pneumonia, unspecified organism: Principal | ICD-10-CM | POA: Diagnosis present

## 2017-10-19 DIAGNOSIS — N390 Urinary tract infection, site not specified: Secondary | ICD-10-CM | POA: Diagnosis present

## 2017-10-19 DIAGNOSIS — I2510 Atherosclerotic heart disease of native coronary artery without angina pectoris: Secondary | ICD-10-CM | POA: Diagnosis present

## 2017-10-19 DIAGNOSIS — Z823 Family history of stroke: Secondary | ICD-10-CM

## 2017-10-19 DIAGNOSIS — Z8673 Personal history of transient ischemic attack (TIA), and cerebral infarction without residual deficits: Secondary | ICD-10-CM | POA: Diagnosis not present

## 2017-10-19 DIAGNOSIS — Z951 Presence of aortocoronary bypass graft: Secondary | ICD-10-CM

## 2017-10-19 DIAGNOSIS — I872 Venous insufficiency (chronic) (peripheral): Secondary | ICD-10-CM | POA: Diagnosis present

## 2017-10-19 DIAGNOSIS — Z7951 Long term (current) use of inhaled steroids: Secondary | ICD-10-CM

## 2017-10-19 DIAGNOSIS — D509 Iron deficiency anemia, unspecified: Secondary | ICD-10-CM | POA: Diagnosis present

## 2017-10-19 DIAGNOSIS — G9340 Encephalopathy, unspecified: Secondary | ICD-10-CM | POA: Diagnosis not present

## 2017-10-19 DIAGNOSIS — I739 Peripheral vascular disease, unspecified: Secondary | ICD-10-CM | POA: Diagnosis present

## 2017-10-19 DIAGNOSIS — E785 Hyperlipidemia, unspecified: Secondary | ICD-10-CM | POA: Diagnosis present

## 2017-10-19 DIAGNOSIS — G9341 Metabolic encephalopathy: Secondary | ICD-10-CM | POA: Diagnosis present

## 2017-10-19 DIAGNOSIS — Z880 Allergy status to penicillin: Secondary | ICD-10-CM

## 2017-10-19 DIAGNOSIS — F0390 Unspecified dementia without behavioral disturbance: Secondary | ICD-10-CM | POA: Diagnosis present

## 2017-10-19 DIAGNOSIS — I272 Pulmonary hypertension, unspecified: Secondary | ICD-10-CM | POA: Diagnosis present

## 2017-10-19 DIAGNOSIS — R471 Dysarthria and anarthria: Secondary | ICD-10-CM | POA: Diagnosis present

## 2017-10-19 DIAGNOSIS — I480 Paroxysmal atrial fibrillation: Secondary | ICD-10-CM | POA: Diagnosis present

## 2017-10-19 DIAGNOSIS — Z7901 Long term (current) use of anticoagulants: Secondary | ICD-10-CM | POA: Diagnosis not present

## 2017-10-19 DIAGNOSIS — Z882 Allergy status to sulfonamides status: Secondary | ICD-10-CM

## 2017-10-19 DIAGNOSIS — F444 Conversion disorder with motor symptom or deficit: Secondary | ICD-10-CM | POA: Diagnosis present

## 2017-10-19 DIAGNOSIS — B9561 Methicillin susceptible Staphylococcus aureus infection as the cause of diseases classified elsewhere: Secondary | ICD-10-CM | POA: Diagnosis present

## 2017-10-19 DIAGNOSIS — R4182 Altered mental status, unspecified: Secondary | ICD-10-CM

## 2017-10-19 DIAGNOSIS — Z91040 Latex allergy status: Secondary | ICD-10-CM

## 2017-10-19 DIAGNOSIS — Z7189 Other specified counseling: Secondary | ICD-10-CM | POA: Diagnosis not present

## 2017-10-19 DIAGNOSIS — Z85828 Personal history of other malignant neoplasm of skin: Secondary | ICD-10-CM | POA: Diagnosis not present

## 2017-10-19 DIAGNOSIS — Z79899 Other long term (current) drug therapy: Secondary | ICD-10-CM | POA: Diagnosis not present

## 2017-10-19 DIAGNOSIS — E871 Hypo-osmolality and hyponatremia: Secondary | ICD-10-CM | POA: Diagnosis present

## 2017-10-19 DIAGNOSIS — Z515 Encounter for palliative care: Secondary | ICD-10-CM | POA: Diagnosis not present

## 2017-10-19 LAB — CBC WITH DIFFERENTIAL/PLATELET
BASOS PCT: 1 %
Basophils Absolute: 0 10*3/uL (ref 0–0.1)
EOS ABS: 0.1 10*3/uL (ref 0–0.7)
Eosinophils Relative: 1 %
HCT: 24.3 % — ABNORMAL LOW (ref 40.0–52.0)
HEMOGLOBIN: 7.8 g/dL — AB (ref 13.0–18.0)
Lymphocytes Relative: 17 %
Lymphs Abs: 0.8 10*3/uL — ABNORMAL LOW (ref 1.0–3.6)
MCH: 22.8 pg — ABNORMAL LOW (ref 26.0–34.0)
MCHC: 31.9 g/dL — AB (ref 32.0–36.0)
MCV: 71.5 fL — ABNORMAL LOW (ref 80.0–100.0)
MONOS PCT: 7 %
Monocytes Absolute: 0.3 10*3/uL (ref 0.2–1.0)
Neutro Abs: 3.4 10*3/uL (ref 1.4–6.5)
Neutrophils Relative %: 74 %
Platelets: 221 10*3/uL (ref 150–440)
RBC: 3.4 MIL/uL — ABNORMAL LOW (ref 4.40–5.90)
RDW: 19.4 % — ABNORMAL HIGH (ref 11.5–14.5)
WBC: 4.6 10*3/uL (ref 3.8–10.6)

## 2017-10-19 LAB — ACETAMINOPHEN LEVEL

## 2017-10-19 LAB — COMPREHENSIVE METABOLIC PANEL
ALBUMIN: 2.6 g/dL — AB (ref 3.5–5.0)
ALT: 11 U/L (ref 0–44)
AST: 20 U/L (ref 15–41)
Alkaline Phosphatase: 71 U/L (ref 38–126)
Anion gap: 10 (ref 5–15)
BUN: 14 mg/dL (ref 8–23)
CHLORIDE: 91 mmol/L — AB (ref 98–111)
CO2: 27 mmol/L (ref 22–32)
Calcium: 8.2 mg/dL — ABNORMAL LOW (ref 8.9–10.3)
Creatinine, Ser: 0.33 mg/dL — ABNORMAL LOW (ref 0.61–1.24)
GFR calc Af Amer: >60 mL/min (ref >60)
GFR calc non Af Amer: >60 mL/min (ref >60)
GLUCOSE: 110 mg/dL — AB (ref 70–99)
POTASSIUM: 4.9 mmol/L (ref 3.5–5.1)
SODIUM: 128 mmol/L — AB (ref 135–145)
Total Bilirubin: 0.4 mg/dL (ref 0.3–1.2)
Total Protein: 5.8 g/dL — ABNORMAL LOW (ref 6.5–8.1)

## 2017-10-19 LAB — URINALYSIS, COMPLETE (UACMP) WITH MICROSCOPIC
Bilirubin Urine: NEGATIVE
GLUCOSE, UA: NEGATIVE mg/dL
Hgb urine dipstick: NEGATIVE
Ketones, ur: NEGATIVE mg/dL
NITRITE: NEGATIVE
PH: 8 (ref 5.0–8.0)
Protein, ur: NEGATIVE mg/dL
SPECIFIC GRAVITY, URINE: 1.011 (ref 1.005–1.030)
Squamous Epithelial / LPF: NONE SEEN (ref 0–5)

## 2017-10-19 LAB — TROPONIN I

## 2017-10-19 LAB — ETHANOL: Alcohol, Ethyl (B): <10 mg/dL (ref <10)

## 2017-10-19 LAB — BRAIN NATRIURETIC PEPTIDE: B Natriuretic Peptide: 415 pg/mL — ABNORMAL HIGH (ref 0.0–100.0)

## 2017-10-19 LAB — LACTIC ACID, PLASMA
LACTIC ACID, VENOUS: 1.5 mmol/L (ref 0.5–1.9)
Lactic Acid, Venous: 1.8 mmol/L (ref 0.5–1.9)

## 2017-10-19 MED ORDER — SODIUM CHLORIDE 0.9 % IV SOLN
1.0000 g | Freq: Once | INTRAVENOUS | Status: AC
  Administered 2017-10-19: 1 g via INTRAVENOUS
  Filled 2017-10-19: qty 10

## 2017-10-19 MED ORDER — SODIUM CHLORIDE 0.9 % IV SOLN
500.0000 mg | Freq: Once | INTRAVENOUS | Status: AC
  Administered 2017-10-19: 500 mg via INTRAVENOUS
  Filled 2017-10-19: qty 500

## 2017-10-19 NOTE — ED Notes (Signed)
Patient transported to X-ray 

## 2017-10-19 NOTE — ED Triage Notes (Signed)
Pt arrived via EMS from home after daughter stated that he was not acting himself and hasn't slept since yesterday. Pts doctor told him to come to the ED to be checked for a possible UTI.

## 2017-10-19 NOTE — H&P (Signed)
[facility] [place] [facility]   PATIENT NAME: [name] [name]    MR#:  [id number]  DATE OF BIRTH:  [birth year]  DATE OF ADMISSION:  10/19/2017  PRIMARY CARE PHYSICIAN: Steele Sizer, MD   REQUESTING/REFERRING PHYSICIAN:   CHIEF COMPLAINT:   Chief Complaint  Patient presents with  . Altered Mental Status    HISTORY OF PRESENT ILLNESS: [name] [name]  is a 84 y.o. male with a known history of atrial fibrillation, coronary artery disease, hyperlipidemia, TIA and other comorbidities. Patient is not able to provide history due to lethargy and confusion.  Most information was obtained from reviewing the medical records and from discussion with emergency room physician and the family. [name] [name] was noted by the family to be confused and staring for the past 24 hours, gradually getting worse.  He is however easy to arouse and still does answer some questions appropriately.  He has had similar episodes in the past and he was placed on Keppra, in case he was having seizures.  No reports of fever or chills, nausea or vomiting, no bleeding at home. Blood test done in the emergency room, are notable for low hemoglobin level at 7.8, low sodium level at 128.  Glucose level is 110 and creatinine is 0.3.  UA is positive for UTI. CT of the brain shows chronic, stable atrophy with questionable superimposed communicating hydrocephalus. Chest x-ray is positive for left lower lobe infiltrate. Is admitted for further evaluation and treatment.  PAST MEDICAL HISTORY:   Past Medical History:  Diagnosis Date  . A-fib (HCC)   . Bradycardia    06/2010  . CAD (coronary artery disease)    nuclear 01/2008, no ischemia, EF 60%, distal anterior scar  . Carotid artery disease (HCC)    doppler 05/2009,  0-39% bilateral / doppler 06/2010 OK  . Edema    Ankle edema August, 2012  . Ejection fraction    EF 45%, echo,05/2009,apical hypo,  ?? posterior and lateral hypo  . Elevated CPK     11/23/08.Marland KitchenMarland Kitchenprobably from debris from popliteal aneurysm  . Fall at home July 2015  . Hx of CABG    1998  . Hyperlipidemia   . Intracranial hemorrhage (HCC)    Two small subependymal bleeds 06/2010,,ASA held  . PAD (peripheral artery disease) (HCC)    Above the knee to below the knee popliteal bypass,, right  //    Doppler,  July, 2013, patent  . Popliteal aneurysm (HCC)    .repair..11/25/2008  Dr. Darrick Penna  . Prominent abdominal aortic pulsation    doppler normal  . Speech problem    Neurologic event with brief infusion and speech difficulty.... January, 2011.... being assessed by neurology  February, 2011  . Syncope    .remote..no etiology  /  syncope 11/2008.. from acute leg pain  . TIA (transient ischemic attack)    Brief episode of confusion and speech difficulty, January, 2012  /  recurrent confusion speech difficulty September, 2012  . Venous insufficiency    support hose    PAST SURGICAL HISTORY:  Past Surgical History:  Procedure Laterality Date  . CORONARY ARTERY BYPASS GRAFT  1998  . popliteal bypass  with reversed greater saphenous vein from right leg.      SOCIAL HISTORY:  Social History   Tobacco Use  . Smoking status: Former Smoker    Years: 20.00    Types: Cigarettes    Last attempt to quit: 09/12/1946    Years since  quitting: 71.1  . Smokeless tobacco: Never Used  . Tobacco comment: quit appox 50 yrs ago  Substance Use Topics  . Alcohol use: No    FAMILY HISTORY:  Family History  Problem Relation Age of Onset  . Stroke Mother   . Diabetes Mother   . Diabetes Sister     DRUG ALLERGIES:  Allergies  Allergen Reactions  . Penicillins Hives    Has patient had a PCN reaction causing immediate rash, facial/tongue/throat swelling, SOB or lightheadedness with hypotension: no Has patient had a PCN reaction causing severe rash involving mucus membranes or skin necrosis: no Has patient had a PCN reaction that required hospitalization no Has patient had a PCN  reaction occurring within the last 10 years: no If all of the above answers are "NO", then may proceed with Cephalosporin use.   . Latex Rash  . Sulfonamide Derivatives Rash    REVIEW OF SYSTEMS:   Unable to obtain, due to patient being confused.  MEDICATIONS AT HOME:  Prior to Admission medications   Medication Sig Start Date End Date Taking? Authorizing Provider  albuterol (PROVENTIL) (2.5 MG/3ML) 0.083% nebulizer solution USE 1 VIAL VIA NEBULIZER EVERY 6 HOURS AS NEEDED FOR WHEEZING OR SHORTNESS OF BREATH 07/14/17  Yes Johnson, Megan P, DO  apixaban (ELIQUIS) 5 MG TABS tablet Take 1 tablet (5 mg total) by mouth 2 (two) times daily. 12/20/15  Yes Steele Sizer, MD  aspirin EC 81 MG tablet Take 81 mg by mouth daily after supper.    Yes [provider]  benzonatate (TESSALON) 100 MG capsule TAKE ONE CAPSULE BY MOUTH EVERY 8 HOURS AS NEEDED COUGH 06/27/16  Yes Crissman, Redge Gainer, MD  calcium carbonate (TUMS - DOSED IN MG ELEMENTAL CALCIUM) 500 MG chewable tablet Chew 2 tablets by mouth daily with supper.    Yes [provider]  docusate sodium (COLACE) 100 MG capsule Take 100 mg by mouth 2 (two) times daily as needed for mild constipation.    Yes [provider]  fluticasone (FLONASE) 50 MCG/ACT nasal spray Place 1 spray into both nostrils daily. Patient taking differently: Place 1 spray into both nostrils daily as needed for allergies.  08/22/15  Yes Auburn Bilberry, MD  levETIRAcetam (KEPPRA) 500 MG tablet Take 1.5 tabs twice a day 09/03/17  Yes Van Clines, MD  Multiple Vitamin (MULTIVITAMIN WITH MINERALS) TABS tablet Take 1 tablet by mouth daily with breakfast.    Yes [provider]  polyethylene glycol (MIRALAX / GLYCOLAX) packet Take 17 g by mouth daily as needed for mild constipation.    Yes [provider]  Vitamin D, Cholecalciferol, 1000 units TABS Take 2,000 tablets by mouth daily.    Yes [provider]  acetaminophen  (TYLENOL) 325 MG tablet Take 2 tablets (650 mg total) by mouth every 6 (six) hours as needed for mild pain (or Fever >/= 101). 10/08/15   Gouru, Aruna, MD  albuterol (PROVENTIL HFA;VENTOLIN HFA) 108 (90 Base) MCG/ACT inhaler Inhale 2 puffs every 6 (six) hours as needed into the lungs for wheezing or shortness of breath. Patient not taking: Reported on 10/19/2017 01/07/17   Olevia Perches P, DO  ciprofloxacin (CIPRO) 500 MG tablet Take 1 tablet (500 mg total) by mouth 2 (two) times daily. Patient not taking: Reported on 10/19/2017 09/19/17   Olevia Perches P, DO  doxycycline (VIBRA-TABS) 100 MG tablet Take 1 tablet (100 mg total) by mouth 2 (two) times daily. Patient not taking: Reported on 10/19/2017  09/22/17   Steele Sizerrissman, Mark A, MD      PHYSICAL EXAMINATION:   VITAL SIGNS: Blood pressure 107/72, pulse 91, temperature 98.1 F (36.7 C), resp. rate 20, height 5\' 9"  (1.753 m), weight 83.6 kg, SpO2 97 %.  GENERAL:  [age] y.o.-year-old patient lying in the bed with no acute distress.  He looks lethargic and confused. EYES: Pupils equal, round, reactive to light and accommodation. No scleral icterus.  HEENT: Head atraumatic, normocephalic. Oropharynx and nasopharynx clear.  NECK:  Supple, no jugular venous distention. No thyroid enlargement, no tenderness.  LUNGS: Reduced breath sounds bilaterally, no wheezing. No use of accessory muscles of respiration.  CARDIOVASCULAR: S1, S2 normal. No S3/S4.  ABDOMEN: Soft, nontender, nondistended. Bowel sounds present. No organomegaly or mass.  EXTREMITIES: No pedal edema, cyanosis, or clubbing.  NEUROLOGIC:   Patient is noted with severe chronic generalized weakness, left side worse than the right side.  He has been bedridden for the past few months. PSYCHIATRIC: The patient is lethargic and confused.  SKIN: No obvious rash, lesion, or ulcer.   LABORATORY PANEL:   CBC Recent Labs  Lab 10/19/17 1915  WBC 4.6  HGB 7.8*  HCT 24.3*  PLT 221  MCV 71.5*  MCH  22.8*  MCHC 31.9*  RDW 19.4*  LYMPHSABS 0.8*  MONOABS 0.3  EOSABS 0.1  BASOSABS 0.0   ------------------------------------------------------------------------------------------------------------------  Chemistries  Recent Labs  Lab 10/19/17 1915  NA 128*  K 4.9  CL 91*  CO2 27  GLUCOSE 110*  BUN 14  CREATININE 0.33*  CALCIUM 8.2*  AST 20  ALT 11  ALKPHOS 71  BILITOT 0.4   ------------------------------------------------------------------------------------------------------------------ estimated creatinine clearance is 57.7 mL/min (A) (by C-G formula based on SCr of 0.33 mg/dL (L)). ------------------------------------------------------------------------------------------------------------------ No results for input(s): TSH, T4TOTAL, T3FREE, THYROIDAB in the last 72 hours.  Invalid input(s): FREET3   Coagulation profile No results for input(s): INR, PROTIME in the last 168 hours. ------------------------------------------------------------------------------------------------------------------- No results for input(s): DDIMER in the last 72 hours. -------------------------------------------------------------------------------------------------------------------  Cardiac Enzymes Recent Labs  Lab 10/19/17 1915  TROPONINI <0.03   ------------------------------------------------------------------------------------------------------------------ Invalid input(s): POCBNP  ---------------------------------------------------------------------------------------------------------------  Urinalysis    Component Value Date/Time   COLORURINE YELLOW (A) 10/19/2017 2034   APPEARANCEUR CLOUDY (A) 10/19/2017 2034   APPEARANCEUR Turbid (A) 09/19/2017 1319   LABSPEC 1.011 10/19/2017 2034   PHURINE 8.0 10/19/2017 2034   GLUCOSEU NEGATIVE 10/19/2017 2034   HGBUR NEGATIVE 10/19/2017 2034   BILIRUBINUR NEGATIVE 10/19/2017 2034   BILIRUBINUR Negative 09/19/2017 1319   KETONESUR  NEGATIVE 10/19/2017 2034   PROTEINUR NEGATIVE 10/19/2017 2034   UROBILINOGEN 1.0 06/26/2010 2144   NITRITE NEGATIVE 10/19/2017 2034   LEUKOCYTESUR LARGE (A) 10/19/2017 2034   LEUKOCYTESUR 3+ (A) 09/19/2017 1319     RADIOLOGY: Dg Chest 2 View  Result Date: 10/19/2017 CLINICAL DATA:  Altered mental status EXAM: CHEST - 2 VIEW COMPARISON:  09/02/2016, 05/30/2016 FINDINGS: Post sternotomy changes. Low lung volumes. Increased opacity at the left base. Stable cardiomediastinal silhouette. Mild vascular congestion. Colonic inter positioning beneath the diaphragm. IMPRESSION: Slight increased airspace disease at the left lung base compared to prior, atelectasis versus minimal infiltrate. Otherwise no significant interval change. Electronically Signed   By: Jasmine PangKim  Fujinaga M.D.   On: 10/19/2017 19:31   Ct Head Wo Contrast  Result Date: 10/19/2017 CLINICAL DATA:  Altered mental status EXAM: CT HEAD WITHOUT CONTRAST TECHNIQUE: Contiguous axial images were obtained from the base of the skull through the vertex without intravenous contrast. COMPARISON:  Head  CT June 17, 2017 and brain MR June 17, 2017 as well as multiple prior studies FINDINGS: Brain: There is diffuse atrophy. Ventricles in proportion or larger than sulci, a stable finding. There is no intracranial mass hemorrhage, extra-axial fluid collection, or midline shift. There is decreased attenuation in periventricular white matter, likely due to a combination of interstitial edema from chronic hydrocephalus and periventricular small vessel disease. No new gray-white compartment lesions are evident. There is no acute appearing infarct. Vascular: No hyperdense vessels. There is calcification in the left carotid siphon. There is also calcification in each distal vertebral artery. Skull: Bony calvarium a appears intact. Sinuses/Orbits: There is mucosal thickening in several ethmoid air cells. Other visualized paranasal sinuses are clear. Other: There is  chronic mastoid air cell disease bilaterally, stable. IMPRESSION: 1. Atrophy with questionable superimposed communicating hydrocephalus. Appearance of the ventricles and sulci is chronic. 2. Decreased attenuation in the periventricular white matter, a stable finding, likely due to a combination interstitial edema from chronic hydrocephalus and small vessel disease. No acute infarct evident. No mass or hemorrhage. 3.  Foci of arterial vascular calcification. 4.  Chronic mastoid air cell disease bilaterally, stable. 5.  Mucosal thickening in several ethmoid air cells. Electronically Signed   By: Bretta Bang III M.D.   On: 10/19/2017 19:26    EKG: Orders placed or performed during the hospital encounter of 10/19/17  . ED EKG  . ED EKG  . EKG 12-Lead  . EKG 12-Lead    IMPRESSION AND PLAN:  1.  Acute metabolic encephalopathy, likely multifactorial, secondary to pneumonia, UTI and hyponatremia.  We will continue to monitor clinically closely while treating the underlying disorder.  2.  Community-acquired pneumonia.  We will start treatment with IV antibiotics ceftriaxone and azithromycin.  Continue supportive treatment with nebulizers and oxygen therapy as needed.  3.  Acute UTI, will treat with ceftriaxone IV while waiting for urine culture result.  4.  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  We will continue to monitor sodium level closely while treating with antibiotics.  5.  Functional paraparesis.  Patient has been bedridden for many months now, unable to ambulate.  His family does a wonderful job taking care of him at home.  6.  Dementia, continue supportive measures.  7.  Paroxysmal atrial fibrillation, currently rate controlled in sinus rhythm.  Continue chronic anticoagulation.  8.  Microcytic anemia.  There is a significant drop in the hemoglobin level noted in the past 4 months from 11.6 to 7.8.  No active bleeding noted.  We will check iron, ferritin, B12 and  folate levels.  All the records are reviewed and case discussed with ED provider. Management plans discussed with the patient, family and they are in agreement.  CODE STATUS: Partial code Code Status History    Date Active Date Inactive Code Status Order ID Comments User Context   09/02/2016 2133 09/04/2016 1604 Partial Code [id number]  Enid Baas, MD Inpatient   09/02/2016 1932 09/02/2016 2133 DNR [id number]  Enid Baas, MD Inpatient   08/19/2016 1807 08/20/2016 2041 Full Code [id number]  Ihor Austin, MD Inpatient   10/06/2015 0031 10/08/2015 1928 Full Code [id number]  Oralia Manis, MD ED   08/17/2015 0114 08/22/2015 1803 Full Code [id number]  Joella Prince, MD ED   08/17/2015 0114 08/17/2015 0114 Full Code [id number]  Joella Prince, MD ED    Questions for Most Recent Historical Code Status (Order [id number])    Question Answer Comment   In the event of  cardiac or respiratory ARREST: Initiate Code Blue, Call Rapid Response Yes    In the event of cardiac or respiratory ARREST: Perform CPR Yes    In the event of cardiac or respiratory ARREST: Perform Intubation/Mechanical Ventilation No    In the event of cardiac or respiratory ARREST: Use NIPPV/BiPAp only if indicated Yes    In the event of cardiac or respiratory ARREST: Administer ACLS medications if indicated Yes    In the event of cardiac or respiratory ARREST: Perform Defibrillation or Cardioversion if indicated Yes        TOTAL TIME TAKING CARE OF THIS PATIENT: 50 minutes.    Cammy CopaAngela Halee Glynn M.D on 10/19/2017 at 11:42 PM  Between 7am to 6pm - Pager - [phone]  After 6pm go to [email or web address] - password [email or web address]  [facility] [place] [facility]  [phone]  CC: Primary care physician; Steele Sizerrissman, Mark A, MD

## 2017-10-19 NOTE — ED Provider Notes (Addendum)
[facility] Emergency Department Provider Note   ____________________________________________   First MD Initiated Contact with Patient 10/19/17 1759     (approximate)  I have reviewed the triage vital signs and the nursing notes.   HISTORY  Chief Complaint Altered Mental Status    HPI Darden Palmerlbert L Kittel is a 84 y.o. male since yesterday afternoon patient has been confused and staring.  He did eat food today normally.  He will answer questions appropriately and at present is following commands.  Patient has been thought to have TIAs but nothing ever turned up on the work-up.  He was also put on Keppra just in case the episodes he was having are seizures.   Past Medical History:  Diagnosis Date  . A-fib (HCC)   . Bradycardia    06/2010  . CAD (coronary artery disease)    nuclear 01/2008, no ischemia, EF 60%, distal anterior scar  . Carotid artery disease (HCC)    doppler 05/2009,  0-39% bilateral / doppler 06/2010 OK  . Edema    Ankle edema August, 2012  . Ejection fraction    EF 45%, echo,05/2009,apical hypo,  ?? posterior and lateral hypo  . Elevated CPK    11/23/08.Marland Kitchen.Marland Kitchen.probably from debris from popliteal aneurysm  . Fall at home July 2015  . Hx of CABG    1998  . Hyperlipidemia   . Intracranial hemorrhage (HCC)    Two small subependymal bleeds 06/2010,,ASA held  . PAD (peripheral artery disease) (HCC)    Above the knee to below the knee popliteal bypass,, right  //    Doppler,  July, 2013, patent  . Popliteal aneurysm (HCC)    .repair..11/25/2008  Dr. Darrick PennaFields  . Prominent abdominal aortic pulsation    doppler normal  . Speech problem    Neurologic event with brief infusion and speech difficulty.... January, 2011.... being assessed by neurology  February, 2011  . Syncope    .remote..no etiology  /  syncope 11/2008.. from acute leg pain  . TIA (transient ischemic attack)    Brief episode of confusion and speech difficulty, January, 2012  /  recurrent  confusion speech difficulty September, 2012  . Venous insufficiency    support hose    Patient Active Problem List   Diagnosis Date Noted  . Pressure ulcer, hip 08/20/2016  . Anemia 03/19/2016  . Seizure (HCC) 10/05/2015  . A-fib (HCC) 10/05/2015  . Chronic systolic (congestive) heart failure (HCC) 09/07/2015  . Altered mental status 08/18/2015  . CAD in native artery 08/07/2015  . Arterial disease (HCC) 08/07/2015  . HLD (hyperlipidemia) 08/07/2015  . Temporary cerebral vascular dysfunction 08/07/2015  . Chronic venous insufficiency 08/07/2015  . H/O neoplasm 08/22/2014  . H/O malignant neoplasm of skin 08/22/2014  . 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 Peripheral vascular disease (HCC) 02/28/2014  . Postsurgical aortocoronary bypass status 02/28/2014  . Syncope and collapse 02/28/2014  . 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 PAD (peripheral artery disease) (HCC)   . TIA (transient ischemic attack)   . Edema   . Hyperlipidemia   . Syncope   . CAD (coronary artery disease)   . Venous insufficiency   . Carotid artery disease (HCC)   . Elevated CPK   . Popliteal aneurysm (HCC)   . Hx of CABG   . Intracranial hemorrhage [facility](HCC)     Past Surgical History:  Procedure Laterality Date  . CORONARY ARTERY  BYPASS GRAFT  1998  . popliteal bypass  with reversed greater saphenous vein from right leg.      Prior to Admission medications   Medication Sig Start Date End Date Taking? Authorizing Provider  albuterol (PROVENTIL) (2.5 MG/3ML) 0.083% nebulizer solution USE 1 VIAL VIA NEBULIZER EVERY 6 HOURS AS NEEDED FOR WHEEZING OR SHORTNESS OF BREATH 07/14/17  Yes Johnson, Megan P, DO  apixaban (ELIQUIS) 5 MG TABS tablet Take 1 tablet (5 mg total) by mouth 2 (two) times daily. 12/20/15  Yes Steele Sizer, MD  aspirin EC 81 MG tablet Take 81 mg by mouth daily after supper.    Yes [provider]  benzonatate (TESSALON) 100 MG capsule TAKE ONE CAPSULE BY MOUTH EVERY 8 HOURS AS NEEDED COUGH 06/27/16  Yes Crissman, Redge Gainer, MD  calcium carbonate (TUMS - DOSED IN MG ELEMENTAL CALCIUM) 500 MG chewable tablet Chew 2 tablets by mouth daily with supper.    Yes [provider]  docusate sodium (COLACE) 100 MG capsule Take 100 mg by mouth 2 (two) times daily as needed for mild constipation.    Yes [provider]  fluticasone (FLONASE) 50 MCG/ACT nasal spray Place 1 spray into both nostrils daily. Patient taking differently: Place 1 spray into both nostrils daily as needed for allergies.  08/22/15  Yes Auburn Bilberry, MD  levETIRAcetam (KEPPRA) 500 MG tablet Take 1.5 tabs twice a day 09/03/17  Yes Van Clines, MD  Multiple Vitamin (MULTIVITAMIN WITH MINERALS) TABS tablet Take 1 tablet by mouth daily with breakfast.    Yes [provider]  polyethylene glycol (MIRALAX / GLYCOLAX) packet Take 17 g by mouth daily as needed for mild constipation.    Yes [provider]  Vitamin D, Cholecalciferol, 1000 units TABS Take 2,000 tablets by mouth daily.    Yes [provider]  acetaminophen (TYLENOL) 325 MG tablet Take 2 tablets (650 mg total) by mouth every 6 (six) hours as needed for mild pain (or Fever >/= 101). 10/08/15   Gouru, Aruna, MD  albuterol (PROVENTIL HFA;VENTOLIN HFA) 108 (90 Base) MCG/ACT inhaler Inhale 2 puffs every 6 (six) hours as needed into the lungs for wheezing or shortness of breath. Patient not taking: Reported on 10/19/2017 01/07/17   Olevia Perches P, DO  ciprofloxacin (CIPRO) 500 MG tablet Take 1 tablet (500 mg total) by mouth 2 (two) times daily. Patient not taking: Reported on 10/19/2017 09/19/17   Olevia Perches P, DO  doxycycline (VIBRA-TABS) 100 MG tablet Take 1 tablet (100 mg total) by mouth 2 (two) times daily. Patient not taking: Reported on 10/19/2017 09/22/17   Steele Sizer, MD    Allergies Penicillins; Latex; and  Sulfonamide derivatives  Family History  Problem Relation Age of Onset  . Stroke Mother   . Diabetes Mother   . Diabetes Sister     Social History Social History   Tobacco Use  . Smoking status: Former Smoker    Years: 20.00    Types: Cigarettes    Last attempt to quit: 09/12/1946    Years since quitting: 71.1  . Smokeless tobacco: Never Used  . Tobacco comment: quit appox 50 yrs ago  Substance Use Topics  . Alcohol use: No  . Drug use: No    Review of Systems  Constitutional: No fever/chills Eyes: No visual changes. ENT: No sore throat. Cardiovascular: Denies chest pain. Respiratory: Denies shortness of breath. Gastrointestinal: No abdominal pain.  No nausea, no vomiting.  No diarrhea.  No constipation. Genitourinary: Negative for dysuria. Musculoskeletal: Negative for back pain. Skin: Negative for rash. Neurological: Negative for headaches, focal weakness   ____________________________________________   PHYSICAL EXAM:  VITAL SIGNS: ED Triage Vitals  Enc Vitals Group     BP 10/19/17 1750 132/72     Pulse Rate 10/19/17 1750 79     Resp 10/19/17 1750 18     Temp 10/19/17 1750 98.1 F (36.7 C)     Temp src --      SpO2 10/19/17 1750 99 %     Weight 10/19/17 1751 184 lb 6.4 oz (83.6 kg)     Height 10/19/17 1751 5\' 9"  (1.753 m)     Head Circumference --      Peak Flow --      Pain Score 10/19/17 1751 0     Pain Loc --      Pain Edu? --      Excl. in GC? --     Constitutional: Alert and oriented.  Is staring upwards.  Will follow most commands but not everyone. Eyes: Conjunctivae are normal.  Pupils are equal extraocular movements appear to be intact with patient will not follow my finger with his eyes. Head: Atraumatic. Nose: No congestion/rhinnorhea. Mouth/Throat: Mucous membranes are moist.  Oropharynx non-erythematous. Neck: No stridor. Cardiovascular: Normal rate, regular rhythm. Grossly normal heart sounds.  Good peripheral  circulation. Respiratory: Normal respiratory effort.  No retractions. Lungs CTAB. Gastrointestinal: Soft and nontender. No distention. No abdominal bruits. No CVA tenderness. Musculoskeletal: No lower extremity tenderness nor edema.   Neurologic: Speech is somewhat slurry.  Patient does follow commands with his arms he has a little bit of left-sided weakness which his daughter reports is chronic.  Patient does not walk and has not walked for quite some time.  He is not moving his legs. Skin:  Skin is warm, dry and intact. No rash noted.   ____________________________________________   LABS (all labs ordered are listed, but only abnormal results are displayed)  Labs Reviewed  COMPREHENSIVE METABOLIC PANEL - Abnormal; Notable for the following components:      Result Value   Sodium 128 (*)    Chloride 91 (*)    Glucose, Bld 110 (*)    Creatinine, Ser 0.33 (*)    Calcium 8.2 (*)    Total Protein 5.8 (*)    Albumin 2.6 (*)    All other components within normal limits  BRAIN NATRIURETIC PEPTIDE - Abnormal; Notable for the following components:   B Natriuretic Peptide 415.0 (*)    All other components within normal limits  CBC WITH DIFFERENTIAL/PLATELET - Abnormal; Notable for the following components:   RBC 3.40 (*)    Hemoglobin 7.8 (*)    HCT 24.3 (*)    MCV 71.5 (*)    MCH 22.8 (*)    MCHC 31.9 (*)    RDW 19.4 (*)    Lymphs Abs 0.8 (*)    All other components within normal limits  URINALYSIS, COMPLETE (UACMP) WITH MICROSCOPIC - Abnormal; Notable for the following components:   Color, Urine YELLOW (*)    APPearance CLOUDY (*)    Leukocytes, UA LARGE (*)    WBC, UA >50 (*)    Bacteria, UA RARE (*)    All other components within normal limits  CULTURE, BLOOD (ROUTINE X 2)  CULTURE, BLOOD (ROUTINE X 2)  ETHANOL  TROPONIN I  LACTIC ACID, PLASMA  ACETAMINOPHEN LEVEL  LACTIC ACID, PLASMA   ____________________________________________  EKG  __EKG read interpreted by me  shows normal sinus rhythm rate of 82 left axis decreased R wave progression otherwise no acute ST-T changes there does appear to be LVH.  __________________________________________  RADIOLOGY  ED MD interpretation:  Official radiology report(s): Dg Chest 2 View  Result Date: 10/19/2017 CLINICAL DATA:  Altered mental status EXAM: CHEST - 2 VIEW COMPARISON:  09/02/2016, 05/30/2016 FINDINGS: Post sternotomy changes. Low lung volumes. Increased opacity at the left base. Stable cardiomediastinal silhouette. Mild vascular congestion. Colonic inter positioning beneath the diaphragm. IMPRESSION: Slight increased airspace disease at the left lung base compared to prior, atelectasis versus minimal infiltrate. Otherwise no significant interval change. Electronically Signed   By: Jasmine PangKim  Fujinaga M.D.   On: 10/19/2017 19:31   Ct Head Wo Contrast  Result Date: 10/19/2017 CLINICAL DATA:  Altered mental status EXAM: CT HEAD WITHOUT CONTRAST TECHNIQUE: Contiguous axial images were obtained from the base of the skull through the vertex without intravenous contrast. COMPARISON:  Head CT June 17, 2017 and brain MR June 17, 2017 as well as multiple prior studies FINDINGS: Brain: There is diffuse atrophy. Ventricles in proportion or larger than sulci, a stable finding. There is no intracranial mass hemorrhage, extra-axial fluid collection, or midline shift. There is decreased attenuation in periventricular white matter, likely due to a combination of interstitial edema from chronic hydrocephalus and periventricular small vessel disease. No new gray-white compartment lesions are evident. There is no acute appearing infarct. Vascular: No hyperdense vessels. There is calcification in the left carotid siphon. There is also calcification in each distal vertebral artery. Skull: Bony calvarium a appears intact. Sinuses/Orbits: There is mucosal thickening in several ethmoid air cells. Other visualized paranasal sinuses are clear.  Other: There is chronic mastoid air cell disease bilaterally, stable. IMPRESSION: 1. Atrophy with questionable superimposed communicating hydrocephalus. Appearance of the ventricles and sulci is chronic. 2. Decreased attenuation in the periventricular white matter, a stable finding, likely due to a combination interstitial edema from chronic hydrocephalus and small vessel disease. No acute infarct evident. No mass or hemorrhage. 3.  Foci of arterial vascular calcification. 4.  Chronic mastoid air cell disease bilaterally, stable. 5.  Mucosal thickening in several ethmoid air cells. Electronically Signed   By: Bretta BangWilliam  Woodruff III M.D.   On: 10/19/2017 19:26    ____________________________________________   PROCEDURES  Procedure(s) performed:   Procedures  Critical Care performed:  ____________________________________________   INITIAL IMPRESSION / ASSESSMENT AND PLAN / ED COURSE   Patient with apparent pneumonia on x-ray.  This would explain his altered mental status.  At his age with his altered mental status and relatively low blood pressure think the safest thing to do would be admit him.       ____________________________________________   FINAL CLINICAL IMPRESSION(S) / ED DIAGNOSES  Final diagnoses:  Altered mental status, unspecified altered mental status type  Community acquired pneumonia of right lung, unspecified part of lung  Hyponatremia     ED Discharge Orders    None       Note:  This document was prepared using Dragon voice recognition software and may include unintentional dictation errors.    Arnaldo NatalMalinda, Okie Bogacz F, MD 10/19/17 2138    Arnaldo NatalMalinda, Ajeet Casasola F, MD 10/19/17 [phone]

## 2017-10-19 NOTE — ED Notes (Signed)
Patient transported to CT 

## 2017-10-20 ENCOUNTER — Other Ambulatory Visit: Payer: Self-pay

## 2017-10-20 DIAGNOSIS — R4182 Altered mental status, unspecified: Secondary | ICD-10-CM

## 2017-10-20 LAB — BASIC METABOLIC PANEL
ANION GAP: 6 (ref 5–15)
BUN: 12 mg/dL (ref 8–23)
CALCIUM: 8.4 mg/dL — AB (ref 8.9–10.3)
CO2: 30 mmol/L (ref 22–32)
Chloride: 94 mmol/L — ABNORMAL LOW (ref 98–111)
Creatinine, Ser: <0.3 mg/dL — ABNORMAL LOW (ref 0.61–1.24)
Glucose, Bld: 91 mg/dL (ref 70–99)
Potassium: 4.6 mmol/L (ref 3.5–5.1)
Sodium: 130 mmol/L — ABNORMAL LOW (ref 135–145)

## 2017-10-20 LAB — CBC
HCT: 23.4 % — ABNORMAL LOW (ref 40.0–52.0)
HEMOGLOBIN: 7.6 g/dL — AB (ref 13.0–18.0)
MCH: 24.2 pg — AB (ref 26.0–34.0)
MCHC: 32.6 g/dL (ref 32.0–36.0)
MCV: 74.3 fL — ABNORMAL LOW (ref 80.0–100.0)
Platelets: 202 10*3/uL (ref 150–440)
RBC: 3.15 MIL/uL — ABNORMAL LOW (ref 4.40–5.90)
RDW: 19.4 % — ABNORMAL HIGH (ref 11.5–14.5)
WBC: 5.4 10*3/uL (ref 3.8–10.6)

## 2017-10-20 LAB — IRON AND TIBC
IRON: 15 ug/dL — AB (ref 45–182)
Saturation Ratios: 5 % — ABNORMAL LOW (ref 17.9–39.5)
TIBC: 296 ug/dL (ref 250–450)
UIBC: 281 ug/dL

## 2017-10-20 LAB — FOLATE: Folate: 30 ng/mL (ref >5.9)

## 2017-10-20 LAB — FERRITIN: FERRITIN: 16 ng/mL — AB (ref 24–336)

## 2017-10-20 LAB — VITAMIN B12: Vitamin B-12: 715 pg/mL (ref 180–914)

## 2017-10-20 MED ORDER — DOCUSATE SODIUM 100 MG PO CAPS
100.0000 mg | ORAL_CAPSULE | Freq: Two times a day (BID) | ORAL | Status: DC
  Administered 2017-10-20 – 2017-10-22 (×5): 100 mg via ORAL
  Filled 2017-10-20 (×6): qty 1

## 2017-10-20 MED ORDER — BISACODYL 5 MG PO TBEC: 5.0000 mg | DELAYED_RELEASE_TABLET | Freq: Every day | ORAL | Status: DC | PRN

## 2017-10-20 MED ORDER — SODIUM CHLORIDE 0.9 % IV SOLN
500.0000 mg | INTRAVENOUS | Status: DC
  Filled 2017-10-20: qty 500

## 2017-10-20 MED ORDER — CALCIUM CARBONATE ANTACID 500 MG PO CHEW
2.0000 | CHEWABLE_TABLET | Freq: Every day | ORAL | Status: DC
  Administered 2017-10-21: 18:00:00 400 mg via ORAL
  Filled 2017-10-20: qty 2

## 2017-10-20 MED ORDER — ALBUTEROL SULFATE (2.5 MG/3ML) 0.083% IN NEBU: 2.5000 mg | INHALATION_SOLUTION | RESPIRATORY_TRACT | Status: DC | PRN

## 2017-10-20 MED ORDER — AZITHROMYCIN 250 MG PO TABS
250.0000 mg | ORAL_TABLET | Freq: Every day | ORAL | Status: DC
  Administered 2017-10-21 (×2): 250 mg via ORAL
  Filled 2017-10-20 (×3): qty 1

## 2017-10-20 MED ORDER — HYDROCODONE-ACETAMINOPHEN 5-325 MG PO TABS: 1.0000 | ORAL_TABLET | ORAL | Status: DC | PRN

## 2017-10-20 MED ORDER — FLUTICASONE PROPIONATE 50 MCG/ACT NA SUSP
1.0000 | Freq: Every day | NASAL | Status: DC | PRN
Start: 2017-10-20 — End: 2017-10-22
  Filled 2017-10-20: qty 16

## 2017-10-20 MED ORDER — BENZONATATE 100 MG PO CAPS: 200.0000 mg | ORAL_CAPSULE | Freq: Three times a day (TID) | ORAL | Status: DC | PRN

## 2017-10-20 MED ORDER — POLYETHYLENE GLYCOL 3350 17 G PO PACK
17.0000 g | PACK | Freq: Every day | ORAL | Status: DC | PRN
Start: 2017-10-20 — End: 2017-10-22

## 2017-10-20 MED ORDER — ONDANSETRON HCL 4 MG/2ML IJ SOLN: 4.0000 mg | Freq: Four times a day (QID) | INTRAMUSCULAR | Status: DC | PRN

## 2017-10-20 MED ORDER — SODIUM CHLORIDE 0.9 % IV SOLN
1.0000 g | INTRAVENOUS | Status: DC
  Administered 2017-10-20 – 2017-10-21 (×2): 1 g via INTRAVENOUS
  Filled 2017-10-20: qty 1
  Filled 2017-10-20: qty 10
  Filled 2017-10-20: qty 1

## 2017-10-20 MED ORDER — ACETAMINOPHEN 650 MG RE SUPP: 650.0000 mg | Freq: Four times a day (QID) | RECTAL | Status: DC | PRN

## 2017-10-20 MED ORDER — APIXABAN 5 MG PO TABS
5.0000 mg | ORAL_TABLET | Freq: Two times a day (BID) | ORAL | Status: DC
  Administered 2017-10-20 – 2017-10-21 (×3): 5 mg via ORAL
  Filled 2017-10-20 (×4): qty 1

## 2017-10-20 MED ORDER — LEVETIRACETAM 750 MG PO TABS
750.0000 mg | ORAL_TABLET | Freq: Two times a day (BID) | ORAL | Status: DC
  Administered 2017-10-20 – 2017-10-22 (×5): 750 mg via ORAL
  Filled 2017-10-20 (×7): qty 1

## 2017-10-20 MED ORDER — SODIUM CHLORIDE 0.9 % IV SOLN
INTRAVENOUS | Status: DC
  Administered 2017-10-20 – 2017-10-21 (×3): via INTRAVENOUS

## 2017-10-20 MED ORDER — ADULT MULTIVITAMIN W/MINERALS CH
1.0000 | ORAL_TABLET | Freq: Every day | ORAL | Status: DC
  Administered 2017-10-20 – 2017-10-22 (×3): 1 via ORAL
  Filled 2017-10-20 (×3): qty 1

## 2017-10-20 MED ORDER — VITAMIN D 1000 UNITS PO TABS
2000.0000 [IU] | ORAL_TABLET | Freq: Every day | ORAL | Status: DC
  Administered 2017-10-20 – 2017-10-22 (×3): 2000 [IU] via ORAL
  Filled 2017-10-20 (×5): qty 2

## 2017-10-20 MED ORDER — ONDANSETRON HCL 4 MG PO TABS
4.0000 mg | ORAL_TABLET | Freq: Four times a day (QID) | ORAL | Status: DC | PRN
Start: 2017-10-20 — End: 2017-10-22

## 2017-10-20 MED ORDER — TRAZODONE HCL 50 MG PO TABS: 25.0000 mg | ORAL_TABLET | Freq: Every evening | ORAL | Status: DC | PRN

## 2017-10-20 MED ORDER — DOCUSATE SODIUM 100 MG PO CAPS: 100.0000 mg | ORAL_CAPSULE | Freq: Two times a day (BID) | ORAL | Status: DC | PRN

## 2017-10-20 MED ORDER — FERROUS SULFATE 325 (65 FE) MG PO TABS
325.0000 mg | ORAL_TABLET | Freq: Two times a day (BID) | ORAL | Status: DC
  Administered 2017-10-20 – 2017-10-22 (×4): 325 mg via ORAL
  Filled 2017-10-20 (×5): qty 1

## 2017-10-20 MED ORDER — ACETAMINOPHEN 325 MG PO TABS: 650.0000 mg | ORAL_TABLET | Freq: Four times a day (QID) | ORAL | Status: DC | PRN

## 2017-10-20 NOTE — Consult Note (Signed)
Reason for Consult: confusion  Referring Physician: Dr. Sherryll Burger  CC: confusion   HPI: [name] [name] is an 84 y.o. maleith a known history of atrial fibrillation, coronary artery disease, hyperlipidemia, TIA presents with increased agitation, confusion, disorientation.   Patient is not able to provide history due to lethargy and confusion.   Per daughter pt has been having increase sleepiness and agitation since Saturday.    Past Medical History:  Diagnosis Date  . A-fib (HCC)   . Bradycardia    06/2010  . CAD (coronary artery disease)    nuclear 01/2008, no ischemia, EF 60%, distal anterior scar  . Carotid artery disease (HCC)    doppler 05/2009,  0-39% bilateral / doppler 06/2010 OK  . Edema    Ankle edema August, 2012  . Ejection fraction    EF 45%, echo,05/2009,apical hypo,  ?? posterior and lateral hypo  . Elevated CPK    11/23/08.Marland KitchenMarland Kitchenprobably from debris from popliteal aneurysm  . Fall at home July 2015  . Hx of CABG    1998  . Hyperlipidemia   . Intracranial hemorrhage (HCC)    Two small subependymal bleeds 06/2010,,ASA held  . PAD (peripheral artery disease) (HCC)    Above the knee to below the knee popliteal bypass,, right  //    Doppler,  July, 2013, patent  . Popliteal aneurysm (HCC)    .repair..11/25/2008  Dr. Darrick Penna  . Prominent abdominal aortic pulsation    doppler normal  . Speech problem    Neurologic event with brief infusion and speech difficulty.... January, 2011.... being assessed by neurology  February, 2011  . Syncope    .remote..no etiology  /  syncope 11/2008.. from acute leg pain  . TIA (transient ischemic attack)    Brief episode of confusion and speech difficulty, January, 2012  /  recurrent confusion speech difficulty September, 2012  . Venous insufficiency    support hose    Past Surgical History:  Procedure Laterality Date  . CORONARY ARTERY BYPASS GRAFT  1998  . popliteal bypass  with reversed greater saphenous vein from right leg.      Family  History  Problem Relation Age of Onset  . Stroke Mother   . Diabetes Mother   . Diabetes Sister     Social History:  reports that he quit smoking about 71 years ago. His smoking use included cigarettes. He quit after 20.00 years of use. He has never used smokeless tobacco. He reports that he does not drink alcohol or use drugs.  Allergies  Allergen Reactions  . Penicillins Hives    Has patient had a PCN reaction causing immediate rash, facial/tongue/throat swelling, SOB or lightheadedness with hypotension: no Has patient had a PCN reaction causing severe rash involving mucus membranes or skin necrosis: no Has patient had a PCN reaction that required hospitalization no Has patient had a PCN reaction occurring within the last 10 years: no If all of the above answers are "NO", then may proceed with Cephalosporin use.   . Latex Rash  . Sulfonamide Derivatives Rash    Medications:  Prior to Admission:  Medications Prior to Admission  Medication Sig Dispense Refill Last Dose  . albuterol (PROVENTIL) (2.5 MG/3ML) 0.083% nebulizer solution USE 1 VIAL VIA NEBULIZER EVERY 6 HOURS AS NEEDED FOR WHEEZING OR SHORTNESS OF BREATH 150 mL 6 prn at prn  . apixaban (ELIQUIS) 5 MG TABS tablet Take 1 tablet (5 mg total) by mouth 2 (two) times daily. 180 tablet 4 10/19/2017  at 1000  . aspirin EC 81 MG tablet Take 81 mg by mouth daily after supper.    10/18/2017 at Unknown time  . benzonatate (TESSALON) 100 MG capsule TAKE ONE CAPSULE BY MOUTH EVERY 8 HOURS AS NEEDED COUGH 90 capsule 0 prn at prn  . calcium carbonate (TUMS - DOSED IN MG ELEMENTAL CALCIUM) 500 MG chewable tablet Chew 2 tablets by mouth daily with supper.    10/18/2017 at Unknown time  . docusate sodium (COLACE) 100 MG capsule Take 100 mg by mouth 2 (two) times daily as needed for mild constipation.    prn at prn  . fluticasone (FLONASE) 50 MCG/ACT nasal spray Place 1 spray into both nostrils daily. (Patient taking differently: Place 1 spray  into both nostrils daily as needed for allergies. )  2 prn at prn  . levETIRAcetam (KEPPRA) 500 MG tablet Take 1.5 tabs twice a day 270 tablet 3 10/19/2017 at 1000  . Multiple Vitamin (MULTIVITAMIN WITH MINERALS) TABS tablet Take 1 tablet by mouth daily with breakfast.    10/19/2017 at 1000  . polyethylene glycol (MIRALAX / GLYCOLAX) packet Take 17 g by mouth daily as needed for mild constipation.    prn at prn  . Vitamin D, Cholecalciferol, 1000 units TABS Take 2,000 tablets by mouth daily.    10/18/2017 at Unknown time  . acetaminophen (TYLENOL) 325 MG tablet Take 2 tablets (650 mg total) by mouth every 6 (six) hours as needed for mild pain (or Fever >/= 101).   prn at prn  . albuterol (PROVENTIL HFA;VENTOLIN HFA) 108 (90 Base) MCG/ACT inhaler Inhale 2 puffs every 6 (six) hours as needed into the lungs for wheezing or shortness of breath. (Patient not taking: Reported on 10/19/2017) 1 Inhaler 2 Not Taking at Unknown time  . ciprofloxacin (CIPRO) 500 MG tablet Take 1 tablet (500 mg total) by mouth 2 (two) times daily. (Patient not taking: Reported on 10/19/2017) 14 tablet 0 Completed Course at Unknown time  . doxycycline (VIBRA-TABS) 100 MG tablet Take 1 tablet (100 mg total) by mouth 2 (two) times daily. (Patient not taking: Reported on 10/19/2017) 20 tablet 0 Not Taking at Unknown time    ROS: Unable to obtain due to somolence   Physical Examination: Blood pressure 129/72, pulse 84, temperature 97.7 F (36.5 C), temperature source Oral, resp. rate 18, height 5\' 9"  (1.753 m), weight 72.3 kg, SpO2 95 %.  Opens eyes to voice Moves all extremities Confusion to location/date/time Dysarthria But not clear neurological focality   Laboratory Studies:   Basic Metabolic Panel: Recent Labs  Lab 10/19/17 1915 10/20/17 0441  NA 128* 130*  K 4.9 4.6  CL 91* 94*  CO2 27 30  GLUCOSE 110* 91  BUN 14 12  CREATININE 0.33* <0.30*  CALCIUM 8.2* 8.4*    Liver Function Tests: Recent Labs  Lab  10/19/17 1915  AST 20  ALT 11  ALKPHOS 71  BILITOT 0.4  PROT 5.8*  ALBUMIN 2.6*   No results for input(s): LIPASE, AMYLASE in the last 168 hours. No results for input(s): AMMONIA in the last 168 hours.  CBC: Recent Labs  Lab 10/19/17 1915 10/20/17 0441  WBC 4.6 5.4  NEUTROABS 3.4  --   HGB 7.8* 7.6*  HCT 24.3* 23.4*  MCV 71.5* 74.3*  PLT 221 202    Cardiac Enzymes: Recent Labs  Lab 10/19/17 1915  TROPONINI <0.03    BNP: Invalid input(s): POCBNP  CBG: No results for input(s): GLUCAP in the last  168 hours.  Microbiology: Results for orders placed or performed during the hospital encounter of 10/19/17  Culture, blood (routine x 2)     Status: None (Preliminary result)   Collection Time: 10/19/17  8:34 PM  Result Value Ref Range Status   Specimen Description BLOOD LEFT ANTECUBITAL  Final   Special Requests   Final    BOTTLES DRAWN AEROBIC AND ANAEROBIC Blood Culture adequate volume   Culture   Final    NO GROWTH < 12 HOURS Performed at [facility], [address] [place], [place] [address]    Report Status PENDING  Incomplete  Culture, blood (routine x 2)     Status: None (Preliminary result)   Collection Time: 10/19/17  8:56 PM  Result Value Ref Range Status   Specimen Description BLOOD LEFT WRIST  Final   Special Requests   Final    BOTTLES DRAWN AEROBIC AND ANAEROBIC Blood Culture results may not be optimal due to an excessive volume of blood received in culture bottles   Culture   Final    NO GROWTH < 12 HOURS Performed at [facility], [address] [place], [place] [address]    Report Status PENDING  Incomplete    Coagulation Studies: No results for input(s): LABPROT, INR in the last 72 hours.  Urinalysis:  Recent Labs  Lab 10/19/17 2034  COLORURINE YELLOW*  LABSPEC 1.011  PHURINE 8.0  GLUCOSEU NEGATIVE  HGBUR NEGATIVE  BILIRUBINUR NEGATIVE  KETONESUR NEGATIVE  PROTEINUR NEGATIVE  NITRITE NEGATIVE  LEUKOCYTESUR  LARGE*    Lipid Panel:     Component Value Date/Time   CHOL 142 08/20/2016 0455   CHOL 128 12/20/2015 1455   TRIG 48 08/20/2016 0455   HDL 48 08/20/2016 0455   HDL 41 12/20/2015 1455   CHOLHDL 3.0 08/20/2016 0455   VLDL 10 08/20/2016 0455   LDLCALC 84 08/20/2016 0455   LDLCALC 65 12/20/2015 1455    HgbA1C:  Lab Results  Component Value Date   HGBA1C 5.3 08/17/2015    Urine Drug Screen:  No results found for: LABOPIA, COCAINSCRNUR, LABBENZ, AMPHETMU, THCU, LABBARB  Alcohol Level:  Recent Labs  Lab 10/19/17 1915  ETH <10     Imaging: Dg Chest 2 View  Result Date: 10/19/2017 CLINICAL DATA:  Altered mental status EXAM: CHEST - 2 VIEW COMPARISON:  09/02/2016, 05/30/2016 FINDINGS: Post sternotomy changes. Low lung volumes. Increased opacity at the left base. Stable cardiomediastinal silhouette. Mild vascular congestion. Colonic inter positioning beneath the diaphragm. IMPRESSION: Slight increased airspace disease at the left lung base compared to prior, atelectasis versus minimal infiltrate. Otherwise no significant interval change. Electronically Signed   By: Jasmine PangKim  Fujinaga M.D.   On: 10/19/2017 19:31   Ct Head Wo Contrast  Result Date: 10/19/2017 CLINICAL DATA:  Altered mental status EXAM: CT HEAD WITHOUT CONTRAST TECHNIQUE: Contiguous axial images were obtained from the base of the skull through the vertex without intravenous contrast. COMPARISON:  Head CT June 17, 2017 and brain MR June 17, 2017 as well as multiple prior studies FINDINGS: Brain: There is diffuse atrophy. Ventricles in proportion or larger than sulci, a stable finding. There is no intracranial mass hemorrhage, extra-axial fluid collection, or midline shift. There is decreased attenuation in periventricular white matter, likely due to a combination of interstitial edema from chronic hydrocephalus and periventricular small vessel disease. No new gray-white compartment lesions are evident. There is no acute  appearing infarct. Vascular: No hyperdense vessels. There is calcification in the  left carotid siphon. There is also calcification in each distal vertebral artery. Skull: Bony calvarium a appears intact. Sinuses/Orbits: There is mucosal thickening in several ethmoid air cells. Other visualized paranasal sinuses are clear. Other: There is chronic mastoid air cell disease bilaterally, stable. IMPRESSION: 1. Atrophy with questionable superimposed communicating hydrocephalus. Appearance of the ventricles and sulci is chronic. 2. Decreased attenuation in the periventricular white matter, a stable finding, likely due to a combination interstitial edema from chronic hydrocephalus and small vessel disease. No acute infarct evident. No mass or hemorrhage. 3.  Foci of arterial vascular calcification. 4.  Chronic mastoid air cell disease bilaterally, stable. 5.  Mucosal thickening in several ethmoid air cells. Electronically Signed   By: Bretta Bang III M.D.   On: 10/19/2017 19:26     Assessment/Plan:  84 y.o. maleith a known history of atrial fibrillation, coronary artery disease, hyperlipidemia, TIA presents with increased agitation, confusion, disorientation.   Patient is not able to provide history due to lethargy and confusion.   Per daughter pt has been having increase sleepiness and agitation since Saturday.    - Likely hyponatremia/UTI related - As per daughter he improved  - Would con't treatment of UTI and observation - No increase in seizure medications as increase in Keppra can cause mor somnolence - don't think there is a need for MRI at this time.  10/20/2017, 11:46 AM

## 2017-10-20 NOTE — Progress Notes (Signed)
[employer] [facility] [place] [facility]   PATIENT NAME: [name] [name]    MR#:  [id number]  DATE OF BIRTH:  08/25/1921  SUBJECTIVE:  CHIEF COMPLAINT:   Chief Complaint  Patient presents with  . Altered Mental Status  Lethargic, sleepy REVIEW OF SYSTEMS:  Review of Systems  Unable to perform ROS: Dementia   DRUG ALLERGIES:   Allergies  Allergen Reactions  . Penicillins Hives    Has patient had a PCN reaction causing immediate rash, facial/tongue/throat swelling, SOB or lightheadedness with hypotension: no Has patient had a PCN reaction causing severe rash involving mucus membranes or skin necrosis: no Has patient had a PCN reaction that required hospitalization no Has patient had a PCN reaction occurring within the last 10 years: no If all of the above answers are "NO", then may proceed with Cephalosporin use.   . Latex Rash  . Sulfonamide Derivatives Rash   VITALS:  Blood pressure 129/72, pulse 84, temperature 97.7 F (36.5 C), temperature source Oral, resp. rate 18, height 5\' 9"  (1.753 m), weight 72.3 kg, SpO2 95 %. PHYSICAL EXAMINATION:  Physical Exam  Constitutional: He appears listless. He appears cachectic. He has a sickly appearance.  HENT:  Head: Normocephalic and atraumatic.  Eyes: Pupils are equal, round, and reactive to light. Conjunctivae and EOM are normal.  Neck: Normal range of motion. Neck supple. No tracheal deviation present. No thyromegaly present.  Cardiovascular: Normal rate, regular rhythm and normal heart sounds.  Pulmonary/Chest: Effort normal and breath sounds normal. No respiratory distress. He has no wheezes. He exhibits no tenderness.  Abdominal: Soft. Bowel sounds are normal. He exhibits no distension. There is no tenderness.  Musculoskeletal: Normal range of motion.  Neurological: He appears listless. He is disoriented. No cranial nerve deficit.  Skin: Skin is warm and dry. No rash noted.   LABORATORY PANEL:  Male CBC Recent  Labs  Lab 10/20/17 0441  WBC 5.4  HGB 7.6*  HCT 23.4*  PLT 202   ------------------------------------------------------------------------------------------------------------------ Chemistries  Recent Labs  Lab 10/19/17 1915 10/20/17 0441  NA 128* 130*  K 4.9 4.6  CL 91* 94*  CO2 27 30  GLUCOSE 110* 91  BUN 14 12  CREATININE 0.33* <0.30*  CALCIUM 8.2* 8.4*  AST 20  --   ALT 11  --   ALKPHOS 71  --   BILITOT 0.4  --    RADIOLOGY:  Dg Chest 2 View  Result Date: 10/19/2017 CLINICAL DATA:  Altered mental status EXAM: CHEST - 2 VIEW COMPARISON:  09/02/2016, 05/30/2016 FINDINGS: Post sternotomy changes. Low lung volumes. Increased opacity at the left base. Stable cardiomediastinal silhouette. Mild vascular congestion. Colonic inter positioning beneath the diaphragm. IMPRESSION: Slight increased airspace disease at the left lung base compared to prior, atelectasis versus minimal infiltrate. Otherwise no significant interval change. Electronically Signed   By: Jasmine PangKim  Fujinaga M.D.   On: 10/19/2017 19:31   Ct Head Wo Contrast  Result Date: 10/19/2017 CLINICAL DATA:  Altered mental status EXAM: CT HEAD WITHOUT CONTRAST TECHNIQUE: Contiguous axial images were obtained from the base of the skull through the vertex without intravenous contrast. COMPARISON:  Head CT June 17, 2017 and brain MR June 17, 2017 as well as multiple prior studies FINDINGS: Brain: There is diffuse atrophy. Ventricles in proportion or larger than sulci, a stable finding. There is no intracranial mass hemorrhage, extra-axial fluid collection, or midline shift. There is decreased attenuation in periventricular white matter, likely due to a combination of interstitial  edema from chronic hydrocephalus and periventricular small vessel disease. No new gray-white compartment lesions are evident. There is no acute appearing infarct. Vascular: No hyperdense vessels. There is calcification in the left carotid siphon. There is also  calcification in each distal vertebral artery. Skull: Bony calvarium a appears intact. Sinuses/Orbits: There is mucosal thickening in several ethmoid air cells. Other visualized paranasal sinuses are clear. Other: There is chronic mastoid air cell disease bilaterally, stable. IMPRESSION: 1. Atrophy with questionable superimposed communicating hydrocephalus. Appearance of the ventricles and sulci is chronic. 2. Decreased attenuation in the periventricular white matter, a stable finding, likely due to a combination interstitial edema from chronic hydrocephalus and small vessel disease. No acute infarct evident. No mass or hemorrhage. 3.  Foci of arterial vascular calcification. 4.  Chronic mastoid air cell disease bilaterally, stable. 5.  Mucosal thickening in several ethmoid air cells. Electronically Signed   By: Bretta BangWilliam  Woodruff III M.D.   On: 10/19/2017 19:26   ASSESSMENT AND PLAN:  1.  Acute metabolic encephalopathy, likely multifactorial, secondary to pneumonia, UTI and hyponatremia: continue to monitor clinically closely while treating the underlying disorder.  2.  Community-acquired pneumonia: continue IV antibiotics ceftriaxone and azithromycin.  Continue supportive treatment with nebulizers and oxygen therapy as needed.  3.  Acute UTI, will treat with ceftriaxone IV while waiting for urine culture result.  4.  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 continue to monitor sodium level closely while treating with antibiotics.  5.  Functional paraparesis.  Patient has been bedridden for many months now, unable to ambulate.  His family does a wonderful job taking care of him at home.  6.  Dementia, continue supportive measures.  7.  Paroxysmal atrial fibrillation, currently rate controlled in sinus rhythm.  Continue chronic anticoagulation for now. D/w pcp, cardio/neuro as an outpt for long term anticoagulation need  8.  Microcytic anemia.  There is a significant drop in the  hemoglobin level noted in the past 4 months from 11.6 to 7.6.  No active bleeding noted.  low ferritin - iron deficiency. Start iron.    Consider Hospice at Home if he qualifies   All the records are reviewed and case discussed with Care Management/Social Worker. Management plans discussed with the patient, family (daughter at bedside) and they are in agreement.  CODE STATUS: Partial Code  TOTAL TIME TAKING CARE OF THIS PATIENT: 20 minutes.   More than 50% of the time was spent in counseling/coordination of care: YES  POSSIBLE D/C IN 1-2 DAYS, DEPENDING ON CLINICAL CONDITION.   Delfino LovettVipul Jori Frerichs M.D on 10/20/2017 at 5:56 PM  Between 7am to 6pm - Pager - [phone]  After 6pm go to [email or web address] - Social research officer, governmentpassword [email or web address]  [facility] [place] [facility]  Office  [phone]  CC: Primary care physician; Steele Sizerrissman, Mark A, MD  Note: This dictation was prepared with Dragon dictation along with smaller phrase technology. Any transcriptional errors that result from this process are unintentional.

## 2017-10-20 NOTE — Progress Notes (Signed)
SLP Cancellation Note  Patient Details Name: [name] [name] MRN: [id number] DOB: 08/10/1921   Cancelled treatment:       Reason Eval/Treat Not Completed: Fatigue/lethargy limiting ability to participate;Patient not medically ready(chart reviewed; consulted NSG then pt/family in room). SLP attempted to engage pt in the BSE, however, pt was too lethargic for any oral awareness of bolus trials(min residue placed at lips w/ no response). Informed family and MD/NSG that pt was not appropriate(or safe) for oral intake/BSE d/t lethargy at this time. MD/NSG will continue to monitor pt's status today w/ possible presentation of a dysphagia diet if he awakens and is safe for oral intake(pt has taken some purees and NECTAR liquids which are his baseline since admission-ED w/out difficulty for report). ST services will f/u tomorrow w/ ongoing assessment. MD agreed.  Recommend oral care and aspiration precautions.     Jerilynn SomKatherine Ezel Vallone, MS, CCC-SLP Katerine Morua 10/20/2017, 12:21 PM

## 2017-10-20 NOTE — ED Notes (Signed)
Patient transported to 108 

## 2017-10-20 NOTE — Progress Notes (Signed)
Family Meeting Note  Advance Directive:yes  Today a meeting took place with the Patient and Daughter at bedside.  Patient is unable to participate due ZO:XWRUEAto:Lacked capacity Dementia   The following clinical team members were present during this meeting:MD and RN  The following were discussed:Patient's diagnosis: 84 y.o. male with a known history of atrial fibrillation, coronary artery disease, hyperlipidemia, TIA and other comorbidities. Patient is not able to provide history due to lethargy and confusion.  Past Medical History:  Diagnosis Date  . A-fib (HCC)   . Bradycardia    06/2010  . CAD (coronary artery disease)    nuclear 01/2008, no ischemia, EF 60%, distal anterior scar  . Carotid artery disease (HCC)    doppler 05/2009,  0-39% bilateral / doppler 06/2010 OK  . Edema    Ankle edema August, 2012  . Ejection fraction    EF 45%, echo,05/2009,apical hypo,  ?? posterior and lateral hypo  . Elevated CPK    11/23/08.Marland Kitchen.Marland Kitchen.probably from debris from popliteal aneurysm  . Fall at home July 2015  . Hx of CABG    1998  . Hyperlipidemia   . Intracranial hemorrhage (HCC)    Two small subependymal bleeds 06/2010,,ASA held  . PAD (peripheral artery disease) (HCC)    Above the knee to below the knee popliteal bypass,, right  //    Doppler,  July, 2013, patent  . Popliteal aneurysm (HCC)    .repair..11/25/2008  Dr. Darrick PennaFields  . Prominent abdominal aortic pulsation    doppler normal  . Speech problem    Neurologic event with brief infusion and speech difficulty.... January, 2011.... being assessed by neurology  February, 2011  . Syncope    .remote..no etiology  /  syncope 11/2008.. from acute leg pain  . TIA (transient ischemic attack)    Brief episode of confusion and speech difficulty, January, 2012  /  recurrent confusion speech difficulty September, 2012  . Venous insufficiency    support hose        , Patient's progosis: < 6 months and Goals for treatment: Partial code as listed in  MOST form  Additional follow-up to be provided: Palliative care eval - consider Palliative care with HHPT at home vs Hospice at home (I prefer Hospice if qualifies)   Time spent during discussion:20 minutes  Delfino LovettVipul Pavel Gadd, MD

## 2017-10-21 ENCOUNTER — Inpatient Hospital Stay
Admit: 2017-10-21 | Discharge: 2017-10-21 | Disposition: A | Discharge status: Home / Self Care | Payer: Medicare Other | Attending: Cardiovascular Disease | Admitting: Cardiovascular Disease

## 2017-10-21 DIAGNOSIS — Z7189 Other specified counseling: Secondary | ICD-10-CM

## 2017-10-21 DIAGNOSIS — G9340 Encephalopathy, unspecified: Secondary | ICD-10-CM

## 2017-10-21 DIAGNOSIS — J189 Pneumonia, unspecified organism: Principal | ICD-10-CM

## 2017-10-21 DIAGNOSIS — E871 Hypo-osmolality and hyponatremia: Secondary | ICD-10-CM

## 2017-10-21 DIAGNOSIS — Z515 Encounter for palliative care: Secondary | ICD-10-CM

## 2017-10-21 LAB — CBC
HCT: 21.9 % — ABNORMAL LOW (ref 40.0–52.0)
Hemoglobin: 7 g/dL — ABNORMAL LOW (ref 13.0–18.0)
MCH: 23.5 pg — AB (ref 26.0–34.0)
MCHC: 31.9 g/dL — AB (ref 32.0–36.0)
MCV: 73.6 fL — ABNORMAL LOW (ref 80.0–100.0)
Platelets: 182 10*3/uL (ref 150–440)
RBC: 2.97 MIL/uL — ABNORMAL LOW (ref 4.40–5.90)
RDW: 19.3 % — AB (ref 11.5–14.5)
WBC: 4 10*3/uL (ref 3.8–10.6)

## 2017-10-21 LAB — BASIC METABOLIC PANEL
Anion gap: 3 — ABNORMAL LOW (ref 5–15)
BUN: 8 mg/dL (ref 8–23)
CALCIUM: 7.9 mg/dL — AB (ref 8.9–10.3)
CO2: 29 mmol/L (ref 22–32)
Chloride: 98 mmol/L (ref 98–111)
Glucose, Bld: 80 mg/dL (ref 70–99)
Potassium: 3.9 mmol/L (ref 3.5–5.1)
SODIUM: 130 mmol/L — AB (ref 135–145)

## 2017-10-21 LAB — ECHOCARDIOGRAM COMPLETE
HEIGHTINCHES: 69 in
WEIGHTICAEL: 2628.8 [oz_av]

## 2017-10-21 LAB — GLUCOSE, CAPILLARY: Glucose-Capillary: 80 mg/dL (ref 70–99)

## 2017-10-21 MED ORDER — AMIODARONE HCL 200 MG PO TABS
200.0000 mg | ORAL_TABLET | Freq: Every day | ORAL | Status: DC
  Administered 2017-10-21 – 2017-10-22 (×2): 200 mg via ORAL
  Filled 2017-10-21 (×2): qty 1

## 2017-10-21 NOTE — Progress Notes (Signed)
Subjective:  Patient state he is doing much better but does not know what brought him here. Daughter who is the primary informant is at bedside and report that patient is back to baseline. He is more attentive, no reports of agitation or restlessness. He was able to identify daughter by name and relationship to her.  Objective: Current vital signs: BP (!) 95/49 (BP Location: Right Arm)   Pulse 68   Temp (!) 97.1 F (36.2 C) (Axillary)   Resp 18   Ht 5\' 9"  (1.753 m)   Wt 74.5 kg   SpO2 93%   BMI 24.26 kg/m  Vital signs in last 24 hours: Temp:  [96.7 F (35.9 C)-97.1 F (36.2 C)] 97.1 F (36.2 C) (08/20 0432) Pulse Rate:  [65-68] 68 (08/20 0432) Resp:  [18] 18 (08/20 0432) BP: (95-114)/(49-64) 95/49 (08/20 0432) SpO2:  [93 %-96 %] 93 % (08/20 0432) Weight:  [74.5 kg] 74.5 kg (08/20 0500)  Intake/Output from previous day: 08/19 0701 - 08/20 0700 In: 2252.5 [P.O.:360; I.V.:1792.5; IV Piggyback:100] Out: -  Intake/Output this shift: No intake/output data recorded. Nutritional status:  Diet Order            DIET - DYS 1 Room service appropriate? Yes; Fluid consistency: Nectar Thick  Diet effective now              Physical Exam   Vitals Blood pressure (!) 95/49, pulse 68, temperature (!) 97.1 F (36.2 C), temperature source Axillary, resp. rate 18, height 5\' 9"  (1.753 m), weight 74.5 kg, SpO2 93 %.   General Exam . Patient looks appropriate of age, well built, nourished and appropriately groomed.  . Cardiovascular Exam: S1, S2 heart sounds present  . Carotid exam revealed no bruit  . Lung exam was clear to auscultation  . Hard of hearing bilaterally  Neurological Exam . Alert,  . Oriented to person and place but not to time and situation . Followed 2 steps commands without difficulty . Able to identify daughter by name and relationship . Gave correct number of children . Attention span and concentration seemed appropriate  . Language seemed intact (naming,  spontaneous speech, comprehension)  . I. Olfactory not examined . II: Visual fields were full. Pupils were equal, round and reactive to light and accommodation . III,IV, VI: ptosis not present, extra-ocular motions intact bilaterally . V,VII: smile symmetric, facial light touch sensation normal bilaterally . VIII: Finger rub was heard symmetric in both ears . IX, X: Palate and uvular movements are normal and oral sensations are OK, gag reflex deffered . XI: Neck muscle strength and shoulder shrug is normal . XII: midline tongue extension . Tone is normal in all extremities, no abnormal movements seen  . Muscle strength in all extremities seemed normal.  . Deep tendon reflexes were symmetric  . Sensations were intact to light touch in all extremities  . Gait and station are normal when examined in small exam room  Lab Results: Basic Metabolic Panel: Recent Labs  Lab 10/19/17 1915 10/20/17 0441 10/21/17 0429  NA 128* 130* 130*  K 4.9 4.6 3.9  CL 91* 94* 98  CO2 27 30 29   GLUCOSE 110* 91 80  BUN 14 12 8   CREATININE 0.33* <0.30* <0.30*  CALCIUM 8.2* 8.4* 7.9*    Liver Function Tests: Recent Labs  Lab 10/19/17 1915  AST 20  ALT 11  ALKPHOS 71  BILITOT 0.4  PROT 5.8*  ALBUMIN 2.6*   No results for input(s): LIPASE, AMYLASE  in the last 168 hours. No results for input(s): AMMONIA in the last 168 hours.  CBC: Recent Labs  Lab 10/19/17 1915 10/20/17 0441 10/21/17 0429  WBC 4.6 5.4 4.0  NEUTROABS 3.4  --   --   HGB 7.8* 7.6* 7.0*  HCT 24.3* 23.4* 21.9*  MCV 71.5* 74.3* 73.6*  PLT 221 202 182    Cardiac Enzymes: Recent Labs  Lab 10/19/17 1915  TROPONINI <0.03    Lipid Panel: No results for input(s): CHOL, TRIG, HDL, CHOLHDL, VLDL, LDLCALC in the last 168 hours.  CBG: No results for input(s): GLUCAP in the last 168 hours.  Microbiology: Results for orders placed or performed during the hospital encounter of 10/19/17  Culture, blood (routine x 2)      Status: None (Preliminary result)   Collection Time: 10/19/17  8:34 PM  Result Value Ref Range Status   Specimen Description BLOOD LEFT ANTECUBITAL  Final   Special Requests   Final    BOTTLES DRAWN AEROBIC AND ANAEROBIC Blood Culture adequate volume   Culture   Final    NO GROWTH 2 DAYS Performed at [facility], [address], [place], [place] [address]    Report Status PENDING  Incomplete  Culture, blood (routine x 2)     Status: None (Preliminary result)   Collection Time: 10/19/17  8:56 PM  Result Value Ref Range Status   Specimen Description BLOOD LEFT WRIST  Final   Special Requests   Final    BOTTLES DRAWN AEROBIC AND ANAEROBIC Blood Culture results may not be optimal due to an excessive volume of blood received in culture bottles   Culture   Final    NO GROWTH 2 DAYS Performed at [facility], [address] [place], [place] [address]    Report Status PENDING  Incomplete    Coagulation Studies: No results for input(s): LABPROT, INR in the last 72 hours.  Imaging: Dg Chest 2 View  Result Date: 10/19/2017 CLINICAL DATA:  Altered mental status EXAM: CHEST - 2 VIEW COMPARISON:  09/02/2016, 05/30/2016 FINDINGS: Post sternotomy changes. Low lung volumes. Increased opacity at the left base. Stable cardiomediastinal silhouette. Mild vascular congestion. Colonic inter positioning beneath the diaphragm. IMPRESSION: Slight increased airspace disease at the left lung base compared to prior, atelectasis versus minimal infiltrate. Otherwise no significant interval change. Electronically Signed   By: Jasmine Pang M.D.   On: 10/19/2017 19:31   Ct Head Wo Contrast  Result Date: 10/19/2017 CLINICAL DATA:  Altered mental status EXAM: CT HEAD WITHOUT CONTRAST TECHNIQUE: Contiguous axial images were obtained from the base of the skull through the vertex without intravenous contrast. COMPARISON:  Head CT June 17, 2017 and brain MR June 17, 2017 as well as multiple prior  studies FINDINGS: Brain: There is diffuse atrophy. Ventricles in proportion or larger than sulci, a stable finding. There is no intracranial mass hemorrhage, extra-axial fluid collection, or midline shift. There is decreased attenuation in periventricular white matter, likely due to a combination of interstitial edema from chronic hydrocephalus and periventricular small vessel disease. No new gray-white compartment lesions are evident. There is no acute appearing infarct. Vascular: No hyperdense vessels. There is calcification in the left carotid siphon. There is also calcification in each distal vertebral artery. Skull: Bony calvarium a appears intact. Sinuses/Orbits: There is mucosal thickening in several ethmoid air cells. Other visualized paranasal sinuses are clear. Other: There is chronic mastoid air cell disease bilaterally, stable. IMPRESSION: 1. Atrophy with questionable superimposed communicating  hydrocephalus. Appearance of the ventricles and sulci is chronic. 2. Decreased attenuation in the periventricular white matter, a stable finding, likely due to a combination interstitial edema from chronic hydrocephalus and small vessel disease. No acute infarct evident. No mass or hemorrhage. 3.  Foci of arterial vascular calcification. 4.  Chronic mastoid air cell disease bilaterally, stable. 5.  Mucosal thickening in several ethmoid air cells. Electronically Signed   By: Bretta BangWilliam  Woodruff III M.D.   On: 10/19/2017 19:26    Medications:  I have reviewed the patient's current medications. Scheduled: . apixaban  5 mg Oral BID  . azithromycin  250 mg Oral Daily  . calcium carbonate  2 tablet Oral Q supper  . cholecalciferol  2,000 Units Oral Daily  . docusate sodium  100 mg Oral BID  . ferrous sulfate  325 mg Oral BID WC  . levETIRAcetam  750 mg Oral BID  . multivitamin with minerals  1 tablet Oral Q breakfast    Assessment: 84 y.o. male with pertinent history of atrial fibrillation on  anticoagulation followed by cardiology Dr. Welton FlakesKhan, coronary artery disease, hyperlipidemia, TIA presents with increased agitation, confusion, disorientation in the setting of acute UTI and hyponatremia now improving. Patient is improved with daughter stating he is back to baseline. He is currently on Keppra for atypical presentation of seizure like activity with normal EEG. Per daughter spells have improved since being on Keppra.  Plan - Continuing treatment of UTI and Na replacement per treating team - Continue current dose of Keppra 750 mg bid as he seem to be tolerating well without side effects - Will hold off imaging or further testing at this time due to improving symptoms and follow as needed.  This patient was staffed with Dr. Loretha BrasilZeylikman, Doyle AskewYuriy who personally evaluated patient, reviewed documentation and agreed with assessment and plan of care as above.  Webb SilversmithElizabeth Latessa Tillis, DNP, FNP-BC Board certified Nurse Practitioner Neurology Department    LOS: 2 days   10/21/2017  11:23 AM

## 2017-10-21 NOTE — Progress Notes (Signed)
*  PRELIMINARY RESULTS* Echocardiogram 2D Echocardiogram has been performed.  Cristela BlueHege, Alrick Cubbage 10/21/2017, 2:02 PM

## 2017-10-21 NOTE — Progress Notes (Signed)
[facility] [place] [facility]   PATIENT NAME: [name] [name]    MR#:  [id number]  DATE OF BIRTH:  10/03/1921  SUBJECTIVE:  CHIEF COMPLAINT:   Chief Complaint  Patient presents with  . Altered Mental Status  much more awake REVIEW OF SYSTEMS:  Review of Systems  Unable to perform ROS: Dementia   DRUG ALLERGIES:   Allergies  Allergen Reactions  . Penicillins Hives    Has patient had a PCN reaction causing immediate rash, facial/tongue/throat swelling, SOB or lightheadedness with hypotension: no Has patient had a PCN reaction causing severe rash involving mucus membranes or skin necrosis: no Has patient had a PCN reaction that required hospitalization no Has patient had a PCN reaction occurring within the last 10 years: no If all of the above answers are "NO", then may proceed with Cephalosporin use.   . Latex Rash  . Sulfonamide Derivatives Rash   VITALS:  Blood pressure 119/63, pulse 75, temperature 97.7 F (36.5 C), temperature source Oral, resp. rate 18, height 5\' 9"  (1.753 m), weight 74.5 kg, SpO2 92 %. PHYSICAL EXAMINATION:  Physical Exam  Constitutional: He appears cachectic.  HENT:  Head: Normocephalic and atraumatic.  Eyes: Pupils are equal, round, and reactive to light. Conjunctivae and EOM are normal.  Neck: Normal range of motion. Neck supple. No tracheal deviation present. No thyromegaly present.  Cardiovascular: Normal rate, regular rhythm and normal heart sounds.  Pulmonary/Chest: Effort normal and breath sounds normal. No respiratory distress. He has no wheezes. He exhibits no tenderness.  Abdominal: Soft. Bowel sounds are normal. He exhibits no distension. There is no tenderness.  Musculoskeletal: Normal range of motion.  Neurological: He is alert. He is disoriented. No cranial nerve deficit.  Skin: Skin is warm and dry. No rash noted.   LABORATORY PANEL:  Male CBC Recent Labs  Lab 10/21/17 0429  WBC 4.0  HGB 7.0*  HCT 21.9*   PLT 182   ------------------------------------------------------------------------------------------------------------------ Chemistries  Recent Labs  Lab 10/19/17 1915  10/21/17 0429  NA 128*   < > 130*  K 4.9   < > 3.9  CL 91*   < > 98  CO2 27   < > 29  GLUCOSE 110*   < > 80  BUN 14   < > 8  CREATININE 0.33*   < > <0.30*  CALCIUM 8.2*   < > 7.9*  AST 20  --   --   ALT 11  --   --   ALKPHOS 71  --   --   BILITOT 0.4  --   --    < > = values in this interval not displayed.   RADIOLOGY:  No results found. ASSESSMENT AND PLAN:  1.  Acute metabolic encephalopathy, likely multifactorial, secondary to pneumonia, UTI and hyponatremia: close to baseline per daughter. continue to monitor clinically closely while treating the underlying disorder.  2.  Community-acquired pneumonia: continue ceftriaxone and azithromycin.  Continue supportive treatment with nebulizers and oxygen therapy as needed.  3.  Acute UTI, will treat with ceftriaxone IV. urine culture growing staph aureus  4.  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 continue to monitor sodium level closely while treating with antibiotics. Na 130  5.  Functional paraparesis.  Patient has been bedridden for many months now, unable to ambulate.  His family does a wonderful job taking care of him at home.  6.  Dementia, continue supportive measures.  7.  Paroxysmal atrial fibrillation, currently  rate controlled in sinus rhythm.  Echo wnl. Cardio stopped asa & eliquis. Started amiodarone.  8.  Microcytic anemia.  There is a significant drop in the hemoglobin level noted in the past 4 months from 11.6 to 7.6.  No active bleeding noted.  low ferritin - iron deficiency. Started iron.  Hb down to 7 today, but daughter would like to wait. Explained the risks and benefits of transfusion. She would like to recheck CBC in am and if < 7 - they're in agreement with transfusion of 1 PRBC   Likely can go home with HHPT,  Palliative care to follow at home   All the records are reviewed and case discussed with Care Management/Social Worker. Management plans discussed with the patient, family (daughter at bedside) and they are in agreement.  CODE STATUS: Partial Code  TOTAL TIME TAKING CARE OF THIS PATIENT: 35 minutes.   More than 50% of the time was spent in counseling/coordination of care: YES  POSSIBLE D/C IN 1-2 DAYS, DEPENDING ON CLINICAL CONDITION.   Delfino LovettVipul Amara Justen M.D on 10/21/2017 at 6:25 PM  Between 7am to 6pm - Pager - [phone]  After 6pm go to [email or web address] - Social research officer, governmentpassword EPAS [email or web address]  [facility] [place] [facility]  Office  [phone]  CC: Primary care physician; Steele Sizerrissman, Mark A, MD  Note: This dictation was prepared with Dragon dictation along with smaller phrase technology. Any transcriptional errors that result from this process are unintentional.

## 2017-10-21 NOTE — Consult Note (Signed)
[name] [name] is a 84 y.o. male  [id number]  Primary Cardiologist: Misha Vanoverbeke Reason for Consultation: GIB  HPI:89 YOM patient lat seen in office 1/19, came with AMS and pneumonia and severe anemia with possible GIB on ellequis.   Review of Systems: AMS   Past Medical History:  Diagnosis Date  . A-fib (HCC)   . Bradycardia    06/2010  . CAD (coronary artery disease)    nuclear 01/2008, no ischemia, EF 60%, distal anterior scar  . Carotid artery disease (HCC)    doppler 05/2009,  0-39% bilateral / doppler 06/2010 OK  . Edema    Ankle edema August, 2012  . Ejection fraction    EF 45%, echo,05/2009,apical hypo,  ?? posterior and lateral hypo  . Elevated CPK    11/23/08.Marland KitchenMarland Kitchenprobably from debris from popliteal aneurysm  . Fall at home July 2015  . Hx of CABG    1998  . Hyperlipidemia   . Intracranial hemorrhage (HCC)    Two small subependymal bleeds 06/2010,,ASA held  . PAD (peripheral artery disease) (HCC)    Above the knee to below the knee popliteal bypass,, right  //    Doppler,  July, 2013, patent  . Popliteal aneurysm (HCC)    .repair..11/25/2008  Dr. Darrick Penna  . Prominent abdominal aortic pulsation    doppler normal  . Speech problem    Neurologic event with brief infusion and speech difficulty.... January, 2011.... being assessed by neurology  February, 2011  . Syncope    .remote..no etiology  /  syncope 11/2008.. from acute leg pain  . TIA (transient ischemic attack)    Brief episode of confusion and speech difficulty, January, 2012  /  recurrent confusion speech difficulty September, 2012  . Venous insufficiency    support hose    Medications Prior to Admission  Medication Sig Dispense Refill  . albuterol (PROVENTIL) (2.5 MG/3ML) 0.083% nebulizer solution USE 1 VIAL VIA NEBULIZER EVERY 6 HOURS AS NEEDED FOR WHEEZING OR SHORTNESS OF BREATH 150 mL 6  . apixaban (ELIQUIS) 5 MG TABS tablet Take 1 tablet (5 mg total) by mouth 2 (two) times daily. 180 tablet 4  . aspirin  EC 81 MG tablet Take 81 mg by mouth daily after supper.     . benzonatate (TESSALON) 100 MG capsule TAKE ONE CAPSULE BY MOUTH EVERY 8 HOURS AS NEEDED COUGH 90 capsule 0  . calcium carbonate (TUMS - DOSED IN MG ELEMENTAL CALCIUM) 500 MG chewable tablet Chew 2 tablets by mouth daily with supper.     . docusate sodium (COLACE) 100 MG capsule Take 100 mg by mouth 2 (two) times daily as needed for mild constipation.     . fluticasone (FLONASE) 50 MCG/ACT nasal spray Place 1 spray into both nostrils daily. (Patient taking differently: Place 1 spray into both nostrils daily as needed for allergies. )  2  . levETIRAcetam (KEPPRA) 500 MG tablet Take 1.5 tabs twice a day 270 tablet 3  . Multiple Vitamin (MULTIVITAMIN WITH MINERALS) TABS tablet Take 1 tablet by mouth daily with breakfast.     . polyethylene glycol (MIRALAX / GLYCOLAX) packet Take 17 g by mouth daily as needed for mild constipation.     . Vitamin D, Cholecalciferol, 1000 units TABS Take 2,000 tablets by mouth daily.     Marland Kitchen acetaminophen (TYLENOL) 325 MG tablet Take 2 tablets (650 mg total) by mouth every 6 (six) hours as needed for mild pain (or Fever >/= 101).    Marland Kitchen  albuterol (PROVENTIL HFA;VENTOLIN HFA) 108 (90 Base) MCG/ACT inhaler Inhale 2 puffs every 6 (six) hours as needed into the lungs for wheezing or shortness of breath. (Patient not taking: Reported on 10/19/2017) 1 Inhaler 2  . ciprofloxacin (CIPRO) 500 MG tablet Take 1 tablet (500 mg total) by mouth 2 (two) times daily. (Patient not taking: Reported on 10/19/2017) 14 tablet 0  . doxycycline (VIBRA-TABS) 100 MG tablet Take 1 tablet (100 mg total) by mouth 2 (two) times daily. (Patient not taking: Reported on 10/19/2017) 20 tablet 0     . apixaban  5 mg Oral BID  . azithromycin  250 mg Oral Daily  . calcium carbonate  2 tablet Oral Q supper  . cholecalciferol  2,000 Units Oral Daily  . docusate sodium  100 mg Oral BID  . ferrous sulfate  325 mg Oral BID WC  . levETIRAcetam  750 mg  Oral BID  . multivitamin with minerals  1 tablet Oral Q breakfast    Infusions: . sodium chloride 75 mL/hr at 10/21/17 0900  . cefTRIAXone (ROCEPHIN)  IV Stopped (10/20/17 2228)    Allergies  Allergen Reactions  . Penicillins Hives    Has patient had a PCN reaction causing immediate rash, facial/tongue/throat swelling, SOB or lightheadedness with hypotension: no Has patient had a PCN reaction causing severe rash involving mucus membranes or skin necrosis: no Has patient had a PCN reaction that required hospitalization no Has patient had a PCN reaction occurring within the last 10 years: no If all of the above answers are "NO", then may proceed with Cephalosporin use.   . Latex Rash  . Sulfonamide Derivatives Rash    Social History   Socioeconomic History  . Marital status: Married    Spouse name: Not on file  . Number of children: Not on file  . Years of education: Not on file  . Highest education level: Not on file  Occupational History  . Occupation: retired  Engineer, productionocial Needs  . Financial resource strain: Patient refused  . Food insecurity:    Worry: Patient refused    Inability: Patient refused  . Transportation needs:    Medical: Patient refused    Non-medical: Patient refused  Tobacco Use  . Smoking status: Former Smoker    Years: 20.00    Types: Cigarettes    Last attempt to quit: 09/12/1946    Years since quitting: 71.1  . Smokeless tobacco: Never Used  . Tobacco comment: quit appox 50 yrs ago  Substance and Sexual Activity  . Alcohol use: No  . Drug use: No  . Sexual activity: Not on file  Lifestyle  . Physical activity:    Days per week: Patient refused    Minutes per session: Patient refused  . Stress: Patient refused  Relationships  . Social connections:    Talks on phone: Patient refused    Gets together: Patient refused    Attends religious service: Patient refused    Active member of club or organization: Patient refused    Attends meetings of  clubs or organizations: Patient refused    Relationship status: Patient refused  . Intimate partner violence:    Fear of current or ex partner: Patient refused    Emotionally abused: Patient refused    Physically abused: Patient refused    Forced sexual activity: Patient refused  Other Topics Concern  . Not on file  Social History Narrative  . Not on file    Family History  Problem Relation Age  of Onset  . Stroke Mother   . Diabetes Mother   . Diabetes Sister     PHYSICAL EXAM: Vitals:   10/20/17 2027 10/21/17 0432  BP: 114/64 (!) 95/49  Pulse: 65 68  Resp: 18 18  Temp: (!) 96.7 F (35.9 C) (!) 97.1 F (36.2 C)  SpO2: 96% 93%     Intake/Output Summary (Last 24 hours) at 10/21/2017 1048 Last data filed at 10/21/2017 0316 Gross per 24 hour  Intake 2252.5 ml  Output -  Net 2252.5 ml    General:  Well appearing. No respiratory difficulty HEENT: normal Neck: supple. no JVD. Carotids 2+ bilat; no bruits. No lymphadenopathy or thryomegaly appreciated. Cor: PMI nondisplaced. Regular rate & rhythm. No rubs, gallops or murmurs. Lungs: clear Abdomen: soft, nontender, nondistended. No hepatosplenomegaly. No bruits or masses. Good bowel sounds. Extremities: no cyanosis, clubbing, rash, edema Neuro: alert & oriented x 3, cranial nerves grossly intact. moves all 4 extremities w/o difficulty. Affect pleasant.  ECG: NSR 83/min no aute changess  Results for orders placed or performed during the hospital encounter of 10/19/17 (from the past 24 hour(s))  CBC     Status: Abnormal   Collection Time: 10/21/17  4:29 AM  Result Value Ref Range   WBC 4.0 3.8 - 10.6 K/uL   RBC 2.97 (L) 4.40 - 5.90 MIL/uL   Hemoglobin 7.0 (L) 13.0 - 18.0 g/dL   HCT 09.821.9 (L) 11.940.0 - 14.752.0 %   MCV 73.6 (L) 80.0 - 100.0 fL   MCH 23.5 (L) 26.0 - 34.0 pg   MCHC 31.9 (L) 32.0 - 36.0 g/dL   RDW 82.919.3 (H) 56.211.5 - 13.014.5 %   Platelets 182 150 - 440 K/uL  Basic metabolic panel     Status: Abnormal   Collection  Time: 10/21/17  4:29 AM  Result Value Ref Range   Sodium 130 (L) 135 - 145 mmol/L   Potassium 3.9 3.5 - 5.1 mmol/L   Chloride 98 98 - 111 mmol/L   CO2 29 22 - 32 mmol/L   Glucose, Bld 80 70 - 99 mg/dL   BUN 8 8 - 23 mg/dL   Creatinine, Ser <8.65<0.30 (L) 0.61 - 1.24 mg/dL   Calcium 7.9 (L) 8.9 - 10.3 mg/dL   GFR calc non Af Amer NOT CALCULATED >60 mL/min   GFR calc Af Amer NOT CALCULATED >60 mL/min   Anion gap 3 (L) 5 - 15   Dg Chest 2 View  Result Date: 10/19/2017 CLINICAL DATA:  Altered mental status EXAM: CHEST - 2 VIEW COMPARISON:  09/02/2016, 05/30/2016 FINDINGS: Post sternotomy changes. Low lung volumes. Increased opacity at the left base. Stable cardiomediastinal silhouette. Mild vascular congestion. Colonic inter positioning beneath the diaphragm. IMPRESSION: Slight increased airspace disease at the left lung base compared to prior, atelectasis versus minimal infiltrate. Otherwise no significant interval change. Electronically Signed   By: Jasmine PangKim  Fujinaga M.D.   On: 10/19/2017 19:31   Ct Head Wo Contrast  Result Date: 10/19/2017 CLINICAL DATA:  Altered mental status EXAM: CT HEAD WITHOUT CONTRAST TECHNIQUE: Contiguous axial images were obtained from the base of the skull through the vertex without intravenous contrast. COMPARISON:  Head CT June 17, 2017 and brain MR June 17, 2017 as well as multiple prior studies FINDINGS: Brain: There is diffuse atrophy. Ventricles in proportion or larger than sulci, a stable finding. There is no intracranial mass hemorrhage, extra-axial fluid collection, or midline shift. There is decreased attenuation in periventricular white matter, likely due to a  combination of interstitial edema from chronic hydrocephalus and periventricular small vessel disease. No new gray-white compartment lesions are evident. There is no acute appearing infarct. Vascular: No hyperdense vessels. There is calcification in the left carotid siphon. There is also calcification in each  distal vertebral artery. Skull: Bony calvarium a appears intact. Sinuses/Orbits: There is mucosal thickening in several ethmoid air cells. Other visualized paranasal sinuses are clear. Other: There is chronic mastoid air cell disease bilaterally, stable. IMPRESSION: 1. Atrophy with questionable superimposed communicating hydrocephalus. Appearance of the ventricles and sulci is chronic. 2. Decreased attenuation in the periventricular white matter, a stable finding, likely due to a combination interstitial edema from chronic hydrocephalus and small vessel disease. No acute infarct evident. No mass or hemorrhage. 3.  Foci of arterial vascular calcification. 4.  Chronic mastoid air cell disease bilaterally, stable. 5.  Mucosal thickening in several ethmoid air cells. Electronically Signed   By: Bretta Bang III M.D.   On: 10/19/2017 19:26     ASSESSMENT AND PLAN: H/O Atrial fibrillation , currently in sinus rhythm, advise echo to assess LV function and will hold ellequis so consideration of EGD/colonoscopy can be done since hemoglobin 7. Had only 2 episode in past of afib and lead to CVA, thus willstart amiodrone 200 daily to prevent episodes. His hemoglobin 1/19 was 11.  Boots Mcglown A

## 2017-10-22 LAB — BASIC METABOLIC PANEL
Anion gap: 4 — ABNORMAL LOW (ref 5–15)
BUN: 11 mg/dL (ref 8–23)
CHLORIDE: 97 mmol/L — AB (ref 98–111)
CO2: 30 mmol/L (ref 22–32)
Calcium: 7.9 mg/dL — ABNORMAL LOW (ref 8.9–10.3)
Glucose, Bld: 98 mg/dL (ref 70–99)
POTASSIUM: 4.2 mmol/L (ref 3.5–5.1)
SODIUM: 131 mmol/L — AB (ref 135–145)

## 2017-10-22 LAB — URINE CULTURE: Culture: >=100000 — AB

## 2017-10-22 LAB — CBC
HCT: 22.7 % — ABNORMAL LOW (ref 40.0–52.0)
Hemoglobin: 7.3 g/dL — ABNORMAL LOW (ref 13.0–18.0)
MCH: 22.9 pg — ABNORMAL LOW (ref 26.0–34.0)
MCHC: 32.1 g/dL (ref 32.0–36.0)
MCV: 71.3 fL — ABNORMAL LOW (ref 80.0–100.0)
Platelets: 191 10*3/uL (ref 150–440)
RBC: 3.18 MIL/uL — AB (ref 4.40–5.90)
RDW: 19.5 % — ABNORMAL HIGH (ref 11.5–14.5)
WBC: 5.4 10*3/uL (ref 3.8–10.6)

## 2017-10-22 MED ORDER — CEFDINIR 300 MG PO CAPS
300.0000 mg | ORAL_CAPSULE | Freq: Two times a day (BID) | ORAL | Status: DC
  Filled 2017-10-22 (×2): qty 1

## 2017-10-22 MED ORDER — AMIODARONE HCL 200 MG PO TABS: 200.0000 mg | ORAL_TABLET | Freq: Every day | ORAL | 0 refills | Status: AC

## 2017-10-22 MED ORDER — FERROUS SULFATE 325 (65 FE) MG PO TABS: 325.0000 mg | ORAL_TABLET | Freq: Two times a day (BID) | ORAL | 0 refills | Status: AC

## 2017-10-22 MED ORDER — CEFDINIR 300 MG PO CAPS: 300.0000 mg | ORAL_CAPSULE | Freq: Two times a day (BID) | ORAL | 0 refills | Status: AC

## 2017-10-22 NOTE — Discharge Summary (Signed)
[facility] - [place] at [facility]   PATIENT NAME: [name] [name]    MR#:  [id number]  DATE OF BIRTH:  01/22/1922  DATE OF ADMISSION:  10/19/2017 ADMITTING PHYSICIAN: Cammy CopaAngela Maier, MD  DATE OF DISCHARGE: 10/22/2017   PRIMARY CARE PHYSICIAN: Steele Sizerrissman, Mark A, MD    ADMISSION DIAGNOSIS:  Hyponatremia [E87.1] Altered mental status, unspecified altered mental status type [R41.82] Community acquired pneumonia of right lung, unspecified part of lung [J18.9]  DISCHARGE DIAGNOSIS:  Active Problems:   Acute encephalopathy   Pneumonia   Anemia,    UTI  SECONDARY DIAGNOSIS:   Past Medical History:  Diagnosis Date  . A-fib (HCC)   . Bradycardia    06/2010  . CAD (coronary artery disease)    nuclear 01/2008, no ischemia, EF 60%, distal anterior scar  . Carotid artery disease (HCC)    doppler 05/2009,  0-39% bilateral / doppler 06/2010 OK  . Edema    Ankle edema August, 2012  . Ejection fraction    EF 45%, echo,05/2009,apical hypo,  ?? posterior and lateral hypo  . Elevated CPK    11/23/08.Marland Kitchen.Marland Kitchen.probably from debris from popliteal aneurysm  . Fall at home July 2015  . Hx of CABG    1998  . Hyperlipidemia   . Intracranial hemorrhage (HCC)    Two small subependymal bleeds 06/2010,,ASA held  . PAD (peripheral artery disease) (HCC)    Above the knee to below the knee popliteal bypass,, right  //    Doppler,  July, 2013, patent  . Popliteal aneurysm (HCC)    .repair..11/25/2008  Dr. Darrick PennaFields  . Prominent abdominal aortic pulsation    doppler normal  . Speech problem    Neurologic event with brief infusion and speech difficulty.... January, 2011.... being assessed by neurology  February, 2011  . Syncope    .remote..no etiology  /  syncope 11/2008.. from acute leg pain  . TIA (transient ischemic attack)    Brief episode of confusion and speech difficulty, January, 2012  /  recurrent confusion speech difficulty September, 2012  . Venous insufficiency    support hose     HOSPITAL COURSE:   1.Acute metabolic encephalopathy, likely multifactorial, secondary to pneumonia, UTI and hyponatremia: close to baseline per daughter. continue to monitor clinically closely while treating the underlying disorder.  2.Community-acquired pneumonia: continue ceftriaxone and azithromycin.Continue supportive treatment with nebulizers and oxygen therapy as needed.   Change to oral on discharge.  3.Acute UTI,will treat with ceftriaxone IV. urine culture growing staph aureus- MSSA.  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 Na 130  Advised daughter for liberal salt use after discharge.  5.Functional paraparesis.Patient has been bedridden for many months now,unable to ambulate. His family does a wonderful job taking care of him at home.  6.Dementia,continue supportive measures.  7.Paroxysmal atrial fibrillation,currently rate controlled in sinus rhythm. Echo wnl. Cardio stopped asa & eliquis. Started amiodarone.  Follow in cardio clinic.  8.Microcytic anemia.There is a significant drop in the hemoglobin level noted in the past 4 months from 11.6 to7.6.No active bleeding noted. low ferritin - iron deficiency. Started iron.  Hb at 7.3  due to old age, he is a poor candidate for procedures. Will manage with holding ASA+ Eliquis and oral iron supplement. Home visiting nurse to check Hb in 1 week.  DISCHARGE CONDITIONS:   Stable.  CONSULTS OBTAINED:  Treatment Team:  Pauletta BrownsZeylikman, Yuriy, MD Laurier NancyKhan, Shaukat A, MD  DRUG ALLERGIES:  Allergies  Allergen Reactions  . Penicillins Hives    Has patient had a PCN reaction causing immediate rash, facial/tongue/throat swelling, SOB or lightheadedness with hypotension: no Has patient had a PCN reaction causing severe rash involving mucus membranes or skin necrosis: no Has patient had a PCN reaction that  required hospitalization no Has patient had a PCN reaction occurring within the last 10 years: no If all of the above answers are "NO", then may proceed with Cephalosporin use.   . Latex Rash  . Sulfonamide Derivatives Rash    DISCHARGE MEDICATIONS:   Allergies as of 10/22/2017      Reactions   Penicillins Hives   Has patient had a PCN reaction causing immediate rash, facial/tongue/throat swelling, SOB or lightheadedness with hypotension: no Has patient had a PCN reaction causing severe rash involving mucus membranes or skin necrosis: no Has patient had a PCN reaction that required hospitalization no Has patient had a PCN reaction occurring within the last 10 years: no If all of the above answers are "NO", then may proceed with Cephalosporin use.   Latex Rash   Sulfonamide Derivatives Rash      Medication List    STOP taking these medications   apixaban 5 MG Tabs tablet Commonly known as:  ELIQUIS   aspirin EC 81 MG tablet   ciprofloxacin 500 MG tablet Commonly known as:  CIPRO   doxycycline 100 MG tablet Commonly known as:  VIBRA-TABS     TAKE these medications   acetaminophen 325 MG tablet Commonly known as:  TYLENOL Take 2 tablets (650 mg total) by mouth every 6 (six) hours as needed for mild pain (or Fever >/= 101).   albuterol 108 (90 Base) MCG/ACT inhaler Commonly known as:  PROVENTIL HFA;VENTOLIN HFA Inhale 2 puffs every 6 (six) hours as needed into the lungs for wheezing or shortness of breath.   albuterol (2.5 MG/3ML) 0.083% nebulizer solution Commonly known as:  PROVENTIL USE 1 VIAL VIA NEBULIZER EVERY 6 HOURS AS NEEDED FOR WHEEZING OR SHORTNESS OF BREATH   amiodarone 200 MG tablet Commonly known as:  PACERONE Take 1 tablet (200 mg total) by mouth daily. Start taking on:  10/23/2017   benzonatate 100 MG capsule Commonly known as:  TESSALON TAKE ONE CAPSULE BY MOUTH EVERY 8 HOURS AS NEEDED COUGH   calcium carbonate 500 MG chewable tablet Commonly  known as:  TUMS - dosed in mg elemental calcium Chew 2 tablets by mouth daily with supper.   cefdinir 300 MG capsule Commonly known as:  OMNICEF Take 1 capsule (300 mg total) by mouth every 12 (twelve) hours for 3 days.   docusate sodium 100 MG capsule Commonly known as:  COLACE Take 100 mg by mouth 2 (two) times daily as needed for mild constipation.   ferrous sulfate 325 (65 FE) MG tablet Take 1 tablet (325 mg total) by mouth 2 (two) times daily with a meal.   fluticasone 50 MCG/ACT nasal spray Commonly known as:  FLONASE Place 1 spray into both nostrils daily. What changed:    when to take this  reasons to take this   levETIRAcetam 500 MG tablet Commonly known as:  KEPPRA Take 1.5 tabs twice a day   multivitamin with minerals Tabs tablet Take 1 tablet by mouth daily with breakfast.   polyethylene glycol packet Commonly known as:  MIRALAX / GLYCOLAX Take 17 g by mouth daily as needed for mild constipation.   Vitamin D (Cholecalciferol) 1000 units Tabs Take 2,000 tablets  by mouth daily.        DISCHARGE INSTRUCTIONS:    Follow Hemoglobin with PMD in 1 week.  If you experience worsening of your admission symptoms, develop shortness of breath, life threatening emergency, suicidal or homicidal thoughts you must seek medical attention immediately by calling 911 or calling your MD immediately  if symptoms less severe.  You Must read complete instructions/literature along with all the possible adverse reactions/side effects for all the Medicines you take and that have been prescribed to you. Take any new Medicines after you have completely understood and accept all the possible adverse reactions/side effects.   Please note  You were cared for by a hospitalist during your hospital stay. If you have any questions about your discharge medications or the care you received while you were in the hospital after you are discharged, you can call the unit and asked to speak with  the hospitalist on call if the hospitalist that took care of you is not available. Once you are discharged, your primary care physician will handle any further medical issues. Please note that NO REFILLS for any discharge medications will be authorized once you are discharged, as it is imperative that you return to your primary care physician (or establish a relationship with a primary care physician if you do not have one) for your aftercare needs so that they can reassess your need for medications and monitor your lab values.    Today   CHIEF COMPLAINT:   Chief Complaint  Patient presents with  . Altered Mental Status    HISTORY OF PRESENT ILLNESS:  [name] [name]  is a 84 y.o. male with a known history of atrial fibrillation, coronary artery disease, hyperlipidemia, TIA and other comorbidities. Patient is not able to provide history due to lethargy and confusion.  Most information was obtained from reviewing the medical records and from discussion with emergency room physician and the family. Mr. Gell was noted by the family to be confused and staring for the past 24 hours, gradually getting worse.  He is however easy to arouse and still does answer some questions appropriately.  He has had similar episodes in the past and he was placed on Keppra, in case he was having seizures.  No reports of fever or chills, nausea or vomiting, no bleeding at home. Blood test done in the emergency room, are notable for low hemoglobin level at 7.8, low sodium level at 128.  Glucose level is 110 and creatinine is 0.3.  UA is positive for UTI. CT of the brain shows chronic, stable atrophy with questionable superimposed communicating hydrocephalus. Chest x-ray is positive for left lower lobe infiltrate. Is admitted for further evaluation and treatment.  VITAL SIGNS:  Blood pressure 105/63, pulse 72, temperature 97.9 F (36.6 C), temperature source Oral, resp. rate 18, height 5\' 9"  (1.753 m), weight 78.2 kg,  SpO2 93 %.  I/O:    Intake/Output Summary (Last 24 hours) at 10/22/2017 1539 Last data filed at 10/22/2017 1042 Gross per 24 hour  Intake 2239.73 ml  Output -  Net 2239.73 ml    PHYSICAL EXAMINATION:   Constitutional: He appears cachectic.  HENT:  Head: Normocephalic and atraumatic.  Eyes: Pupils are equal, round, and reactive to light. Conjunctivae and EOM are normal.  Neck: Normal range of motion. Neck supple. No tracheal deviation present. No thyromegaly present.  Cardiovascular: Normal rate, regular rhythm and normal heart sounds.  Pulmonary/Chest: Effort normal and breath sounds normal. No respiratory distress. He has  no wheezes. He exhibits no tenderness.  Abdominal: Soft. Bowel sounds are normal. He exhibits no distension. There is no tenderness.  Musculoskeletal: Normal range of motion.  Neurological: He is alert. He is disoriented. No cranial nerve deficit.  Skin: Skin is warm and dry. No rash noted.   DATA REVIEW:   CBC Recent Labs  Lab 10/22/17 0415  WBC 5.4  HGB 7.3*  HCT 22.7*  PLT 191    Chemistries  Recent Labs  Lab 10/19/17 1915  10/22/17 0415  NA 128*   < > 131*  K 4.9   < > 4.2  CL 91*   < > 97*  CO2 27   < > 30  GLUCOSE 110*   < > 98  BUN 14   < > 11  CREATININE 0.33*   < > <0.30*  CALCIUM 8.2*   < > 7.9*  AST 20  --   --   ALT 11  --   --   ALKPHOS 71  --   --   BILITOT 0.4  --   --    < > = values in this interval not displayed.    Cardiac Enzymes Recent Labs  Lab 10/19/17 1915  TROPONINI <0.03    Microbiology Results  Results for orders placed or performed during the hospital encounter of 10/19/17  Culture, blood (routine x 2)     Status: None (Preliminary result)   Collection Time: 10/19/17  8:34 PM  Result Value Ref Range Status   Specimen Description BLOOD LEFT ANTECUBITAL  Final   Special Requests   Final    BOTTLES DRAWN AEROBIC AND ANAEROBIC Blood Culture adequate volume   Culture   Final    NO GROWTH 3  DAYS Performed at [facility], [address] [place], [place] [address]    Report Status PENDING  Incomplete  Urine Culture     Status: Abnormal   Collection Time: 10/19/17  8:34 PM  Result Value Ref Range Status   Specimen Description   Final    URINE, RANDOM Performed at [facility], [address] [place], [place] [address]    Special Requests   Final    NONE Performed at [facility], [address] [place], [place] [address]    Culture >=100,000 COLONIES/mL STAPHYLOCOCCUS AUREUS (A)  Final   Report Status 10/22/2017 FINAL  Final   Organism ID, Bacteria STAPHYLOCOCCUS AUREUS (A)  Final      Susceptibility   Staphylococcus aureus - MIC*    CIPROFLOXACIN <=0.5 SENSITIVE Sensitive     GENTAMICIN <=0.5 SENSITIVE Sensitive     NITROFURANTOIN <=16 SENSITIVE Sensitive     OXACILLIN <=0.25 SENSITIVE Sensitive     TETRACYCLINE <=1 SENSITIVE Sensitive     VANCOMYCIN 1 SENSITIVE Sensitive     TRIMETH/SULFA <=10 SENSITIVE Sensitive     CLINDAMYCIN RESISTANT Resistant     RIFAMPIN <=0.5 SENSITIVE Sensitive     Inducible Clindamycin POSITIVE Resistant     * >=100,000 COLONIES/mL STAPHYLOCOCCUS AUREUS  Culture, blood (routine x 2)     Status: None (Preliminary result)   Collection Time: 10/19/17  8:56 PM  Result Value Ref Range Status   Specimen Description BLOOD LEFT WRIST  Final   Special Requests   Final    BOTTLES DRAWN AEROBIC AND ANAEROBIC Blood Culture results may not be optimal due to an excessive volume of blood received in culture bottles   Culture   Final    NO  GROWTH 3 DAYS Performed at [facility], [address] [place], [place] [address]    Report Status PENDING  Incomplete    RADIOLOGY:  No results found.  EKG:   Orders placed or performed during the hospital encounter of 10/19/17  . ED EKG  . ED EKG  . EKG 12-Lead  . EKG 12-Lead      Management plans discussed with the patient, family and they are in  agreement.  CODE STATUS:     Code Status Orders  (From admission, onward)         Start     Ordered   10/20/17 0119  Limited resuscitation (code)  Continuous    Question Answer Comment  In the event of cardiac or respiratory ARREST: Initiate Code Blue, Call Rapid Response Yes   In the event of cardiac or respiratory ARREST: Perform CPR Yes   In the event of cardiac or respiratory ARREST: Perform Intubation/Mechanical Ventilation No   In the event of cardiac or respiratory ARREST: Use NIPPV/BiPAp only if indicated Yes   In the event of cardiac or respiratory ARREST: Administer ACLS medications if indicated Yes   In the event of cardiac or respiratory ARREST: Perform Defibrillation or Cardioversion if indicated Yes      10/20/17 0118        Code Status History    Date Active Date Inactive Code Status Order ID Comments User Context   09/02/2016 2133 09/04/2016 1604 Partial Code [id number]  Enid BaasKalisetti, Radhika, MD Inpatient   09/02/2016 1932 09/02/2016 2133 DNR [id number]  Enid BaasKalisetti, Radhika, MD Inpatient   08/19/2016 1807 08/20/2016 2041 Full Code [id number]  Ihor AustinPyreddy, Pavan, MD Inpatient   10/06/2015 0031 10/08/2015 1928 Full Code [id number]  Oralia ManisWillis, David, MD ED   08/17/2015 0114 08/22/2015 1803 Full Code [id number]  Joella PrinceSullivan, James, MD ED   08/17/2015 0114 08/17/2015 0114 Full Code [id number]  Joella PrinceSullivan, James, MD ED    Advance Directive Documentation     Most Recent Value  Type of Advance Directive  Healthcare Power of Attorney, Living will  Pre-existing out of facility DNR order (yellow form or pink MOST form)  -  "MOST" Form in Place?  -      TOTAL TIME TAKING CARE OF THIS PATIENT: 35 minutes.    Altamese DillingVaibhavkumar Rosmarie Esquibel M.D on 10/22/2017 at 3:39 PM  Between 7am to 6pm - Pager - [phone]  After 6pm go to [email or web address] - password [email or web address]  [facility]  Office  [phone]  CC: Primary care physician; Steele Sizerrissman, Mark A, MD   Note: This dictation was prepared with  Dragon dictation along with smaller phrase technology. Any transcriptional errors that result from this process are unintentional.

## 2017-10-22 NOTE — Progress Notes (Signed)
SUBJECTIVE: Pt is alert and denies chest pain or shortness of breath. Caregiver is at side.   Vitals:   10/21/17 1535 10/21/17 1947 10/22/17 0449 10/22/17 0500  BP: 119/63 125/65 115/63   Pulse: 75 76 83   Resp: 18  18   Temp: 97.7 F (36.5 C) 98.1 F (36.7 C) 98.2 F (36.8 C)   TempSrc: Oral Oral Oral   SpO2: 92% 92% 95%   Weight:    78.2 kg  Height:        Intake/Output Summary (Last 24 hours) at 10/22/2017 0915 Last data filed at 10/22/2017 [id number] Gross per 24 hour  Intake 2119.73 ml  Output -  Net 2119.73 ml    LABS: Basic Metabolic Panel: Recent Labs    10/21/17 0429 10/22/17 0415  NA 130* 131*  K 3.9 4.2  CL 98 97*  CO2 29 30  GLUCOSE 80 98  BUN 8 11  CREATININE <0.30* <0.30*  CALCIUM 7.9* 7.9*   Liver Function Tests: Recent Labs    10/19/17 1915  AST 20  ALT 11  ALKPHOS 71  BILITOT 0.4  PROT 5.8*  ALBUMIN 2.6*   No results for input(s): LIPASE, AMYLASE in the last 72 hours. CBC: Recent Labs    10/19/17 1915  10/21/17 0429 10/22/17 0415  WBC 4.6   < > 4.0 5.4  NEUTROABS 3.4  --   --   --   HGB 7.8*   < > 7.0* 7.3*  HCT 24.3*   < > 21.9* 22.7*  MCV 71.5*   < > 73.6* 71.3*  PLT 221   < > 182 191   < > = values in this interval not displayed.   Cardiac Enzymes: Recent Labs    10/19/17 1915  TROPONINI <0.03   BNP: Invalid input(s): POCBNP D-Dimer: No results for input(s): DDIMER in the last 72 hours. Hemoglobin A1C: No results for input(s): HGBA1C in the last 72 hours. Fasting Lipid Panel: No results for input(s): CHOL, HDL, LDLCALC, TRIG, CHOLHDL, LDLDIRECT in the last 72 hours. Thyroid Function Tests: No results for input(s): TSH, T4TOTAL, T3FREE, THYROIDAB in the last 72 hours.  Invalid input(s): FREET3 Anemia Panel: Recent Labs    10/20/17 0441  VITAMINB12 715  FOLATE 30.0  FERRITIN 16*  TIBC 296  IRON 15*     PHYSICAL EXAM General: Very elderly man, pale, lying in bed, in no acute distress HEENT:  Normocephalic and  atramatic Neck:  No JVD.  Lungs: Clear bilaterally to auscultation and percussion. Heart: HRRR . Normal S1 and S2 without gallops or murmurs.  Abdomen: Bowel sounds are positive, abdomen soft and non-tender  Msk:  Back normal, normal gait. Normal strength and tone for age. Extremities: No clubbing, cyanosis or edema.   Psych:  Good affect, responds appropriately  TELEMETRY: N/A  ASSESSMENT AND PLAN:  History of atrial fibrillation, currently in sinus rhythm and was taking Eliquis. Pt admitted with suspected GI bleed and HGB of 7.0 which increased slightly today to 7.3.   Will continue to hold Eliquis and pt was placed back on amiodarone to prevent future episodes of atrial fibrillation. Pt is also taking azithromycin and has last dose tomorrow. Continue to monitor pt for signs or symptoms of interaction.  Echo results: The right ventricular systolic pressure was increased consistent with mild pulmonary hypertension. Normal LVEF, mild diastolic dysfunction and mild MR.    Active Problems:   Acute encephalopathy    Caroleen HammanKristin Petros Ahart, NP-C 10/22/2017 9:15 AM Cell: [phone]

## 2017-10-22 NOTE — Consult Note (Signed)
Consultation Note Date: 10/22/2017   Patient Name: [name] [name]  DOB: Jun 12, 1921  MRN: [id number]  Age / Sex: 84 y.o., male  PCP: Guadalupe Maple, MD Referring Physician: Vaughan Basta, *  Reason for Consultation: Establishing goals of care  HPI/Patient Profile: 84 y.o. male admitted on 10/19/2017 from home with complaints of altered mental status. He has past medical history significant for atrial fibrillation, CAD, TIA, hyperlipidemia, falls, CABG (1998), Intracranial hemorrhage, PAD, and EF 45%. During ED course patient was unable to provide history due to lethargy and confusion. Family was able to provide updates. Per family report patient was noticed to be confused and staring for 24 hrs prior to arrival which gradually worsened. Family reported he was placed Keppra for similar episodes in the past and expected possible seizure activity. He was easily aroused and answered some questions appropriately. Hemoglobin 7.8. Sodium 128, Creatinine 0.3. UA positive for UTI. CT of head showed chronic, stable atrophy with questionable superimposed communicating hydrocephalus. Chest x-ray positive for left lower lobe infiltrate. Since admission patient continues with home medications, IV fluids, and IV ceftriaxone. He has been seen by Cardiology and ASA & Eliquis held and patient started on amiodarone. Palliative Medicine consulted for goals of care discussion.   Clinical Assessment and Goals of Care: I have reviewed medical records including lab results, imaging, Epic notes, and MAR, received report from the bedside RN, and assessed the patient. I then met at the bedside with his daughter [name] to discuss diagnosis prognosis, [place], EOL wishes, disposition and options. Patient is in bed resting. He has dementia. He is unable to engage in goals of care discussion with his daughter and I.   I introduced Palliative  Medicine as specialized medical care for people living with serious illness. It focuses on providing relief from the symptoms and stress of a serious illness. The goal is to improve quality of life for both the patient and the family.  We discussed a brief life review of the patient. Both patient and his wife are 96 years old and live in the home with daughter and caregivers.   As far as functional and nutritional status daughter reports patient at baseline is bed bound however he is alert and able to engage in appropriate conversations with them. He has been bedridden for several months. The family have 24/7 caregivers who care for him and his wife in the home. He requires assistance with all ADLs.   We discussed his current illness and what it means in the larger context of his on-going co-morbidities.  Natural disease trajectory and expectations at EOL were discussed. Daughter verbalizes that she is aware of his current illness. She remains hopeful that he will continue to show signs of improvement as he has thus far. She feels that most of his mental status changes and weakness is due to his UTI as this has been a pattern of his anytime he has an infection.   I attempted to elicit values and goals of care important to the patient  and family.   The difference between aggressive medical intervention and comfort care was considered in light of the patient's goals of care. Daughter verbalizes that her sisters and herself would like to continue to treat the treatable. We discussed patient's code status in full detail given his co-morbidities and current illness. Daughter verbalized that she is aware of his age and current conditions, however would like all aggressive measures in the event of an cardiac or respiratory arrest accept intubation.   She reports her sisters and herself are unsure in regards to artificial feedings and would have to evaluate the situation if it ever came to that.   Hospice and  Palliative Care services outpatient were explained and offered. We spent a significant amount of time discussing the differences between both services and how they could support Mr.Brigitte Pulse. Daughter reports she has spoken to [facility] on Monday to have an evaluation for her mother and was familiar with their services based on their assessment. Daughter verbalizes that her and her sisters are still attempting to decide if they would like Palliative versus hospice. They are concerned that if patient develops another UTI or infection that with hospice services he would not be treated or tested and would hate for him to pass away prematurely all from a treatable infection. We discussed the goals and philosophy of both Palliative and hospice. Daughter is quite torn as they have had friends with experiences both good and bad with the outpatient services. I attempted to answer all of her questions and provide factual information in regards to her concerns and comparisons to information received from others. Daughter verbalized appreciation and feelings of having a clear expectation and awareness of the benefits and goals of both Palliative and Hospice. She feels at this time she would not like either services at discharge and would like to wait until they are able to arrange services outpatient for her mother and combined their services together based on their PCP Dr. Rance Muir recommendations.   Questions and concerns were addressed.  Hard Choices booklet left for review. The family was encouraged to call with questions or concerns.  PMT will continue to support holistically.  Primary Decision Makers: HCPOA-Daughters: [name] and [name]    SUMMARY OF RECOMMENDATIONS    Partial Code-as confirmed by daughter. She would like all aggressive measures except intubation.   Continue to treat the treatable while hospitalized including aggressive measures if needed.   Discussed in details  hospice and Palliative services with daughters. At this time they would not like to have either service set-up at discharge. They are requesting to follow up with their PCP after discharge and make arrangements to set up patient and wife both at the same time.   Palliative Medicine team will continue to support patient, family, and medical team during hospitalization as needed. Please call.   Code Status/Advance Care Planning:  Limited code-NO INTUBATION   Palliative Prophylaxis:   Aspiration, Bowel Regimen, Delirium Protocol, Oral Care and Turn Reposition  Additional Recommendations (Limitations, Scope, Preferences):  Full Scope Treatment-continue to treat the treatable  Psycho-social/Spiritual:   Desire for further Chaplaincy support NO   Additional Recommendations: Caregiving  Support/Resources and Education on Hospice  Prognosis:   Unable to determine-Guarded to poor in the setting of recurrent UTIs, TIA, PAD, hx of CABG, a-fib, CAD, bed-bound, dementia, hyperlipidemia, encephalopathy, and seizures.   Discharge Planning: Home with [place] would like to wait to have Palliative versus Hospice set-up by their PCP.  Primary Diagnoses: Present on Admission: . Acute encephalopathy   I have reviewed the medical record, interviewed the patient and family, and examined the patient. The following aspects are pertinent.  Past Medical History:  Diagnosis Date  . A-fib ([place])   . Bradycardia    06/2010  . CAD (coronary artery disease)    nuclear 01/2008, no ischemia, EF 60%, distal anterior scar  . Carotid artery disease ([place])    doppler 05/2009,  0-39% bilateral / doppler 06/2010 [place]  . Edema    Ankle edema August, 2012  . Ejection fraction    EF 45%, echo,05/2009,apical hypo,  ?? posterior and lateral hypo  . Elevated CPK    11/23/08.Marland KitchenMarland Kitchenprobably from debris from popliteal aneurysm  . Fall at home July 2015  . Hx of CABG    1998  . Hyperlipidemia   .  Intracranial hemorrhage ([place])    Two small subependymal bleeds 06/2010,,ASA held  . PAD (peripheral artery disease) ([facility])    Above the knee to below the knee popliteal bypass,, right  //    Doppler,  July, 2013, patent  . Popliteal aneurysm ([place])    .repair..11/25/2008  Dr. Oneida Alar  . Prominent abdominal aortic pulsation    doppler normal  . Speech problem    Neurologic event with brief infusion and speech difficulty.... January, 2011.... being assessed by neurology  February, 2011  . Syncope    .remote..no etiology  /  syncope 11/2008.. from acute leg pain  . TIA (transient ischemic attack)    Brief episode of confusion and speech difficulty, January, 2012  /  recurrent confusion speech difficulty September, 2012  . Venous insufficiency    support hose   Social History   Socioeconomic History  . Marital status: Married    Spouse name: Not on file  . Number of children: Not on file  . Years of education: Not on file  . Highest education level: Not on file  Occupational History  . Occupation: retired  Scientific laboratory technician  . Financial resource strain: Patient refused  . Food insecurity:    Worry: Patient refused    Inability: Patient refused  . Transportation needs:    Medical: Patient refused    Non-medical: Patient refused  Tobacco Use  . Smoking status: Former Smoker    Years: 20.00    Types: Cigarettes    Last attempt to quit: 09/12/1946    Years since quitting: 71.1  . Smokeless tobacco: Never Used  . Tobacco comment: quit appox 50 yrs ago  Substance and Sexual Activity  . Alcohol use: No  . Drug use: No  . Sexual activity: Not on file  Lifestyle  . Physical activity:    Days per week: Patient refused    Minutes per session: Patient refused  . Stress: Patient refused  Relationships  . Social connections:    Talks on phone: Patient refused    Gets together: Patient refused    Attends religious service: Patient refused    Active member of club or organization: Patient  refused    Attends meetings of clubs or organizations: Patient refused    Relationship status: Patient refused  Other Topics Concern  . Not on file  Social History Narrative  . Not on file   Family History  Problem Relation Age of Onset  . Stroke Mother   . Diabetes Mother   . Diabetes Sister    Scheduled Meds: . amiodarone  200 mg Oral Daily  . azithromycin  250  mg Oral Daily  . calcium carbonate  2 tablet Oral Q supper  . cefdinir  300 mg Oral Q12H  . cholecalciferol  2,000 Units Oral Daily  . docusate sodium  100 mg Oral BID  . ferrous sulfate  325 mg Oral BID WC  . levETIRAcetam  750 mg Oral BID  . multivitamin with minerals  1 tablet Oral Q breakfast   Continuous Infusions: . sodium chloride 75 mL/hr at 10/21/17 2327   PRN Meds:.acetaminophen **OR** acetaminophen, albuterol, benzonatate, bisacodyl, fluticasone, ondansetron **OR** ondansetron (ZOFRAN) IV, polyethylene glycol, traZODone Medications Prior to Admission:  Prior to Admission medications   Medication Sig Start Date End Date Taking? Authorizing Provider  albuterol (PROVENTIL) (2.5 MG/3ML) 0.083% nebulizer solution USE 1 VIAL VIA NEBULIZER EVERY 6 HOURS AS NEEDED FOR WHEEZING OR SHORTNESS OF BREATH 07/14/17  Yes Johnson, Megan P, DO  apixaban (ELIQUIS) 5 MG TABS tablet Take 1 tablet (5 mg total) by mouth 2 (two) times daily. 12/20/15  Yes Guadalupe Maple, MD  aspirin EC 81 MG tablet Take 81 mg by mouth daily after supper.    Yes [provider]  benzonatate (TESSALON) 100 MG capsule TAKE ONE CAPSULE BY MOUTH EVERY 8 HOURS AS NEEDED COUGH 06/27/16  Yes Crissman, Jeannette How, MD  calcium carbonate (TUMS - DOSED IN MG ELEMENTAL CALCIUM) 500 MG chewable tablet Chew 2 tablets by mouth daily with supper.    Yes [provider]  docusate sodium (COLACE) 100 MG capsule Take 100 mg by mouth 2 (two) times daily as needed for mild constipation.    Yes [provider]  fluticasone (FLONASE) 50 MCG/ACT nasal  spray Place 1 spray into both nostrils daily. Patient taking differently: Place 1 spray into both nostrils daily as needed for allergies.  08/22/15  Yes Dustin Flock, MD  levETIRAcetam (KEPPRA) 500 MG tablet Take 1.5 tabs twice a day 09/03/17  Yes Cameron Sprang, MD  Multiple Vitamin (MULTIVITAMIN WITH MINERALS) TABS tablet Take 1 tablet by mouth daily with breakfast.    Yes [provider]  polyethylene glycol (MIRALAX / GLYCOLAX) packet Take 17 g by mouth daily as needed for mild constipation.    Yes [provider]  Vitamin D, Cholecalciferol, 1000 units TABS Take 2,000 tablets by mouth daily.    Yes [provider]  acetaminophen (TYLENOL) 325 MG tablet Take 2 tablets (650 mg total) by mouth every 6 (six) hours as needed for mild pain (or Fever >/= 101). 10/08/15   Gouru, Aruna, MD  albuterol (PROVENTIL HFA;VENTOLIN HFA) 108 (90 Base) MCG/ACT inhaler Inhale 2 puffs every 6 (six) hours as needed into the lungs for wheezing or shortness of breath. Patient not taking: Reported on 10/19/2017 01/07/17   Park Liter P, DO  ciprofloxacin (CIPRO) 500 MG tablet Take 1 tablet (500 mg total) by mouth 2 (two) times daily. Patient not taking: Reported on 10/19/2017 09/19/17   Park Liter P, DO  doxycycline (VIBRA-TABS) 100 MG tablet Take 1 tablet (100 mg total) by mouth 2 (two) times daily. Patient not taking: Reported on 10/19/2017 09/22/17   Guadalupe Maple, MD   Allergies  Allergen Reactions  . Penicillins Hives    Has patient had a PCN reaction causing immediate rash, facial/tongue/throat swelling, SOB or lightheadedness with hypotension: no Has patient had a PCN reaction causing severe rash involving mucus membranes or skin necrosis: no Has patient had a PCN reaction that required hospitalization no Has patient had a PCN reaction occurring within the  last 10 years: no If all of the above answers are "NO", then may proceed with Cephalosporin use.   . Latex Rash  .  Sulfonamide Derivatives Rash   Review of Systems  Unable to perform ROS: Dementia    Physical Exam  Constitutional: Vital signs are normal. He is cooperative. He appears ill.  Frail in appearance   Cardiovascular: Normal rate, regular rhythm, normal heart sounds and normal pulses.  Pulmonary/Chest: Effort normal. He has decreased breath sounds.  Abdominal: Soft. Normal appearance and bowel sounds are normal.  Musculoskeletal:  Bed bound   Neurological: He is alert. He is disoriented.  Baseline with hx of dementia   Psychiatric: Cognition and memory are impaired. He expresses inappropriate judgment.  Nursing note and vitals reviewed.   Vital Signs: BP 115/63 (BP Location: Right Arm)   Pulse 83   Temp 98.2 F (36.8 C) (Oral)   Resp 18   Ht 5' 9"  (1.753 m)   Wt 78.2 kg   SpO2 95%   BMI 25.47 kg/m  Pain Scale: PAINAD   Pain Score: 0-No pain   SpO2: SpO2: 95 % O2 Device:SpO2: 95 % O2 Flow Rate: .   IO: Intake/output summary:   Intake/Output Summary (Last 24 hours) at 10/22/2017 1355 Last data filed at 10/22/2017 1042 Gross per 24 hour  Intake 2239.73 ml  Output -  Net 2239.73 ml    LBM: Last BM Date: 10/20/17 Baseline Weight: Weight: 83.6 kg Most recent weight: Weight: 78.2 kg     Palliative Assessment/Data: PPS 30 %   Time In: 1500 Time Out: 1630 Time Total: 90 min.   Greater than 50%  of this time was spent counseling and coordinating care related to the above assessment and plan.  Signed by:  Alda Lea, NP-BC Palliative Medicine Team  Phone: [phone] Fax: [phone] Pager: [phone] Amion: Bjorn Pippin    Please contact Palliative Medicine Team phone at [phone] for questions and concerns.  For individual provider: See Shea Evans

## 2017-10-22 NOTE — Progress Notes (Signed)
Discharge instructions along with home medications and follow up gone over with daughter. She verbalized that she understood instructions. Prescriptions given to patient. IV removed. Pt being discharged home on via non emergent [facility] on room air, no distress noted. Otilio JeffersonMadelyn S Fenton, RN

## 2017-10-22 NOTE — Progress Notes (Signed)
[facility] - [place] at [facility]   PATIENT NAME: [name] [name]    MR#:  [id number]  DATE OF BIRTH:  01/22/1922  DATE OF ADMISSION:  10/19/2017 ADMITTING PHYSICIAN: Cammy CopaAngela Maier, MD  DATE OF DISCHARGE: 10/22/2017   PRIMARY CARE PHYSICIAN: Steele Sizerrissman, Mark A, MD    ADMISSION DIAGNOSIS:  Hyponatremia [E87.1] Altered mental status, unspecified altered mental status type [R41.82] Community acquired pneumonia of right lung, unspecified part of lung [J18.9]  DISCHARGE DIAGNOSIS:  Active Problems:   Acute encephalopathy   Pneumonia   Anemia,    UTI  SECONDARY DIAGNOSIS:   Past Medical History:  Diagnosis Date  . A-fib (HCC)   . Bradycardia    06/2010  . CAD (coronary artery disease)    nuclear 01/2008, no ischemia, EF 60%, distal anterior scar  . Carotid artery disease (HCC)    doppler 05/2009,  0-39% bilateral / doppler 06/2010 OK  . Edema    Ankle edema August, 2012  . Ejection fraction    EF 45%, echo,05/2009,apical hypo,  ?? posterior and lateral hypo  . Elevated CPK    11/23/08.Marland Kitchen.Marland Kitchen.probably from debris from popliteal aneurysm  . Fall at home July 2015  . Hx of CABG    1998  . Hyperlipidemia   . Intracranial hemorrhage (HCC)    Two small subependymal bleeds 06/2010,,ASA held  . PAD (peripheral artery disease) (HCC)    Above the knee to below the knee popliteal bypass,, right  //    Doppler,  July, 2013, patent  . Popliteal aneurysm (HCC)    .repair..11/25/2008  Dr. Darrick PennaFields  . Prominent abdominal aortic pulsation    doppler normal  . Speech problem    Neurologic event with brief infusion and speech difficulty.... January, 2011.... being assessed by neurology  February, 2011  . Syncope    .remote..no etiology  /  syncope 11/2008.. from acute leg pain  . TIA (transient ischemic attack)    Brief episode of confusion and speech difficulty, January, 2012  /  recurrent confusion speech difficulty September, 2012  . Venous insufficiency    support hose     HOSPITAL COURSE:   1.Acute metabolic encephalopathy, likely multifactorial, secondary to pneumonia, UTI and hyponatremia: close to baseline per daughter. continue to monitor clinically closely while treating the underlying disorder.  2.Community-acquired pneumonia: continue ceftriaxone and azithromycin.Continue supportive treatment with nebulizers and oxygen therapy as needed.   Change to oral on discharge.  3.Acute UTI,will treat with ceftriaxone IV. urine culture growing staph aureus- MSSA.  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 Na 130  Advised daughter for liberal salt use after discharge.  5.Functional paraparesis.Patient has been bedridden for many months now,unable to ambulate. His family does a wonderful job taking care of him at home.  6.Dementia,continue supportive measures.  7.Paroxysmal atrial fibrillation,currently rate controlled in sinus rhythm. Echo wnl. Cardio stopped asa & eliquis. Started amiodarone.  Follow in cardio clinic.  8.Microcytic anemia.There is a significant drop in the hemoglobin level noted in the past 4 months from 11.6 to7.6.No active bleeding noted. low ferritin - iron deficiency. Started iron.  Hb at 7.3  due to old age, he is a poor candidate for procedures. Will manage with holding ASA+ Eliquis and oral iron supplement. Home visiting nurse to check Hb in 1 week.  DISCHARGE CONDITIONS:   Stable.  CONSULTS OBTAINED:  Treatment Team:  Pauletta BrownsZeylikman, Yuriy, MD Laurier NancyKhan, Shaukat A, MD  DRUG ALLERGIES:  Allergies  Allergen Reactions  . Penicillins Hives    Has patient had a PCN reaction causing immediate rash, facial/tongue/throat swelling, SOB or lightheadedness with hypotension: no Has patient had a PCN reaction causing severe rash involving mucus membranes or skin necrosis: no Has patient had a PCN reaction that  required hospitalization no Has patient had a PCN reaction occurring within the last 10 years: no If all of the above answers are "NO", then may proceed with Cephalosporin use.   . Latex Rash  . Sulfonamide Derivatives Rash    DISCHARGE MEDICATIONS:   Allergies as of 10/22/2017      Reactions   Penicillins Hives   Has patient had a PCN reaction causing immediate rash, facial/tongue/throat swelling, SOB or lightheadedness with hypotension: no Has patient had a PCN reaction causing severe rash involving mucus membranes or skin necrosis: no Has patient had a PCN reaction that required hospitalization no Has patient had a PCN reaction occurring within the last 10 years: no If all of the above answers are "NO", then may proceed with Cephalosporin use.   Latex Rash   Sulfonamide Derivatives Rash      Medication List    STOP taking these medications   apixaban 5 MG Tabs tablet Commonly known as:  ELIQUIS   aspirin EC 81 MG tablet   ciprofloxacin 500 MG tablet Commonly known as:  CIPRO   doxycycline 100 MG tablet Commonly known as:  VIBRA-TABS     TAKE these medications   acetaminophen 325 MG tablet Commonly known as:  TYLENOL Take 2 tablets (650 mg total) by mouth every 6 (six) hours as needed for mild pain (or Fever >/= 101).   albuterol 108 (90 Base) MCG/ACT inhaler Commonly known as:  PROVENTIL HFA;VENTOLIN HFA Inhale 2 puffs every 6 (six) hours as needed into the lungs for wheezing or shortness of breath.   albuterol (2.5 MG/3ML) 0.083% nebulizer solution Commonly known as:  PROVENTIL USE 1 VIAL VIA NEBULIZER EVERY 6 HOURS AS NEEDED FOR WHEEZING OR SHORTNESS OF BREATH   amiodarone 200 MG tablet Commonly known as:  PACERONE Take 1 tablet (200 mg total) by mouth daily. Start taking on:  10/23/2017   benzonatate 100 MG capsule Commonly known as:  TESSALON TAKE ONE CAPSULE BY MOUTH EVERY 8 HOURS AS NEEDED COUGH   calcium carbonate 500 MG chewable tablet Commonly  known as:  TUMS - dosed in mg elemental calcium Chew 2 tablets by mouth daily with supper.   cefdinir 300 MG capsule Commonly known as:  OMNICEF Take 1 capsule (300 mg total) by mouth every 12 (twelve) hours for 3 days.   docusate sodium 100 MG capsule Commonly known as:  COLACE Take 100 mg by mouth 2 (two) times daily as needed for mild constipation.   ferrous sulfate 325 (65 FE) MG tablet Take 1 tablet (325 mg total) by mouth 2 (two) times daily with a meal.   fluticasone 50 MCG/ACT nasal spray Commonly known as:  FLONASE Place 1 spray into both nostrils daily. What changed:    when to take this  reasons to take this   levETIRAcetam 500 MG tablet Commonly known as:  KEPPRA Take 1.5 tabs twice a day   multivitamin with minerals Tabs tablet Take 1 tablet by mouth daily with breakfast.   polyethylene glycol packet Commonly known as:  MIRALAX / GLYCOLAX Take 17 g by mouth daily as needed for mild constipation.   Vitamin D (Cholecalciferol) 1000 units Tabs Take 2,000 tablets  by mouth daily.        DISCHARGE INSTRUCTIONS:    Follow Hemoglobin with PMD in 1 week.  If you experience worsening of your admission symptoms, develop shortness of breath, life threatening emergency, suicidal or homicidal thoughts you must seek medical attention immediately by calling 911 or calling your MD immediately  if symptoms less severe.  You Must read complete instructions/literature along with all the possible adverse reactions/side effects for all the Medicines you take and that have been prescribed to you. Take any new Medicines after you have completely understood and accept all the possible adverse reactions/side effects.   Please note  You were cared for by a hospitalist during your hospital stay. If you have any questions about your discharge medications or the care you received while you were in the hospital after you are discharged, you can call the unit and asked to speak with  the hospitalist on call if the hospitalist that took care of you is not available. Once you are discharged, your primary care physician will handle any further medical issues. Please note that NO REFILLS for any discharge medications will be authorized once you are discharged, as it is imperative that you return to your primary care physician (or establish a relationship with a primary care physician if you do not have one) for your aftercare needs so that they can reassess your need for medications and monitor your lab values.    Today   CHIEF COMPLAINT:   Chief Complaint  Patient presents with  . Altered Mental Status    HISTORY OF PRESENT ILLNESS:  Kevon Tench  is a 84 y.o. male with a known history of atrial fibrillation, coronary artery disease, hyperlipidemia, TIA and other comorbidities. Patient is not able to provide history due to lethargy and confusion.  Most information was obtained from reviewing the medical records and from discussion with emergency room physician and the family. Mr. Lince was noted by the family to be confused and staring for the past 24 hours, gradually getting worse.  He is however easy to arouse and still does answer some questions appropriately.  He has had similar episodes in the past and he was placed on Keppra, in case he was having seizures.  No reports of fever or chills, nausea or vomiting, no bleeding at home. Blood test done in the emergency room, are notable for low hemoglobin level at 7.8, low sodium level at 128.  Glucose level is 110 and creatinine is 0.3.  UA is positive for UTI. CT of the brain shows chronic, stable atrophy with questionable superimposed communicating hydrocephalus. Chest x-ray is positive for left lower lobe infiltrate. Is admitted for further evaluation and treatment.  VITAL SIGNS:  Blood pressure 105/63, pulse 72, temperature 97.9 F (36.6 C), temperature source Oral, resp. rate 18, height 5\' 9"  (1.753 m), weight 78.2 kg,  SpO2 93 %.  I/O:    Intake/Output Summary (Last 24 hours) at 10/22/2017 1535 Last data filed at 10/22/2017 1042 Gross per 24 hour  Intake 2239.73 ml  Output -  Net 2239.73 ml    PHYSICAL EXAMINATION:   Constitutional: He appears cachectic.  HENT:  Head: Normocephalic and atraumatic.  Eyes: Pupils are equal, round, and reactive to light. Conjunctivae and EOM are normal.  Neck: Normal range of motion. Neck supple. No tracheal deviation present. No thyromegaly present.  Cardiovascular: Normal rate, regular rhythm and normal heart sounds.  Pulmonary/Chest: Effort normal and breath sounds normal. No respiratory distress. He has  no wheezes. He exhibits no tenderness.  Abdominal: Soft. Bowel sounds are normal. He exhibits no distension. There is no tenderness.  Musculoskeletal: Normal range of motion.  Neurological: He is alert. He is disoriented. No cranial nerve deficit.  Skin: Skin is warm and dry. No rash noted.   DATA REVIEW:   CBC Recent Labs  Lab 10/22/17 0415  WBC 5.4  HGB 7.3*  HCT 22.7*  PLT 191    Chemistries  Recent Labs  Lab 10/19/17 1915  10/22/17 0415  NA 128*   < > 131*  K 4.9   < > 4.2  CL 91*   < > 97*  CO2 27   < > 30  GLUCOSE 110*   < > 98  BUN 14   < > 11  CREATININE 0.33*   < > <0.30*  CALCIUM 8.2*   < > 7.9*  AST 20  --   --   ALT 11  --   --   ALKPHOS 71  --   --   BILITOT 0.4  --   --    < > = values in this interval not displayed.    Cardiac Enzymes Recent Labs  Lab 10/19/17 1915  TROPONINI <0.03    Microbiology Results  Results for orders placed or performed during the hospital encounter of 10/19/17  Culture, blood (routine x 2)     Status: None (Preliminary result)   Collection Time: 10/19/17  8:34 PM  Result Value Ref Range Status   Specimen Description BLOOD LEFT ANTECUBITAL  Final   Special Requests   Final    BOTTLES DRAWN AEROBIC AND ANAEROBIC Blood Culture adequate volume   Culture   Final    NO GROWTH 3  DAYS Performed at [facility], [address] [place], [place] [address]    Report Status PENDING  Incomplete  Urine Culture     Status: Abnormal   Collection Time: 10/19/17  8:34 PM  Result Value Ref Range Status   Specimen Description   Final    URINE, RANDOM Performed at [facility], [address] [place], [place] [address]    Special Requests   Final    NONE Performed at [facility], [address] [place], [place] [address]    Culture >=100,000 COLONIES/mL STAPHYLOCOCCUS AUREUS (A)  Final   Report Status 10/22/2017 FINAL  Final   Organism ID, Bacteria STAPHYLOCOCCUS AUREUS (A)  Final      Susceptibility   Staphylococcus aureus - MIC*    CIPROFLOXACIN <=0.5 SENSITIVE Sensitive     GENTAMICIN <=0.5 SENSITIVE Sensitive     NITROFURANTOIN <=16 SENSITIVE Sensitive     OXACILLIN <=0.25 SENSITIVE Sensitive     TETRACYCLINE <=1 SENSITIVE Sensitive     VANCOMYCIN 1 SENSITIVE Sensitive     TRIMETH/SULFA <=10 SENSITIVE Sensitive     CLINDAMYCIN RESISTANT Resistant     RIFAMPIN <=0.5 SENSITIVE Sensitive     Inducible Clindamycin POSITIVE Resistant     * >=100,000 COLONIES/mL STAPHYLOCOCCUS AUREUS  Culture, blood (routine x 2)     Status: None (Preliminary result)   Collection Time: 10/19/17  8:56 PM  Result Value Ref Range Status   Specimen Description BLOOD LEFT WRIST  Final   Special Requests   Final    BOTTLES DRAWN AEROBIC AND ANAEROBIC Blood Culture results may not be optimal due to an excessive volume of blood received in culture bottles   Culture   Final    NO  GROWTH 3 DAYS Performed at [facility], [address] [place], [place] [address]    Report Status PENDING  Incomplete    RADIOLOGY:  No results found.  EKG:   Orders placed or performed during the hospital encounter of 10/19/17  . ED EKG  . ED EKG  . EKG 12-Lead  . EKG 12-Lead      Management plans discussed with the patient, family and they are in  agreement.  CODE STATUS:     Code Status Orders  (From admission, onward)         Start     Ordered   10/20/17 0119  Limited resuscitation (code)  Continuous    Question Answer Comment  In the event of cardiac or respiratory ARREST: Initiate Code Blue, Call Rapid Response Yes   In the event of cardiac or respiratory ARREST: Perform CPR Yes   In the event of cardiac or respiratory ARREST: Perform Intubation/Mechanical Ventilation No   In the event of cardiac or respiratory ARREST: Use NIPPV/BiPAp only if indicated Yes   In the event of cardiac or respiratory ARREST: Administer ACLS medications if indicated Yes   In the event of cardiac or respiratory ARREST: Perform Defibrillation or Cardioversion if indicated Yes      10/20/17 0118        Code Status History    Date Active Date Inactive Code Status Order ID Comments User Context   09/02/2016 2133 09/04/2016 1604 Partial Code [id number]  Enid Baas, MD Inpatient   09/02/2016 1932 09/02/2016 2133 DNR [id number]  Enid Baas, MD Inpatient   08/19/2016 1807 08/20/2016 2041 Full Code [id number]  Ihor Austin, MD Inpatient   10/06/2015 0031 10/08/2015 1928 Full Code [id number]  Oralia Manis, MD ED   08/17/2015 0114 08/22/2015 1803 Full Code [id number]  Joella Prince, MD ED   08/17/2015 0114 08/17/2015 0114 Full Code [id number]  Joella Prince, MD ED    Advance Directive Documentation     Most Recent Value  Type of Advance Directive  Healthcare Power of Attorney, Living will  Pre-existing out of facility DNR order (yellow form or pink MOST form)  -  "MOST" Form in Place?  -      TOTAL TIME TAKING CARE OF THIS PATIENT: 35 minutes.    Altamese Dilling M.D on 10/22/2017 at 3:35 PM  Between 7am to 6pm - Pager - [phone]  After 6pm go to [email or web address] - password [email or web address]  [facility]  Office  [phone]  CC: Primary care physician; Steele Sizer, MD   Note: This dictation was prepared with  Dragon dictation along with smaller phrase technology. Any transcriptional errors that result from this process are unintentional.

## 2017-10-22 NOTE — Evaluation (Signed)
Clinical/Bedside Swallow Evaluation Patient Details  Name: [name] [name] MRN: [id number] Date of Birth: 10/05/1921  Today's Date: 10/22/2017 Time: SLP Start Time (ACUTE ONLY): 1030 SLP Stop Time (ACUTE ONLY): 1130 SLP Time Calculation (min) (ACUTE ONLY): 60 min  Past Medical History:  Past Medical History:  Diagnosis Date  . A-fib (HCC)   . Bradycardia    06/2010  . CAD (coronary artery disease)    nuclear 01/2008, no ischemia, EF 60%, distal anterior scar  . Carotid artery disease (HCC)    doppler 05/2009,  0-39% bilateral / doppler 06/2010 OK  . Edema    Ankle edema August, 2012  . Ejection fraction    EF 45%, echo,05/2009,apical hypo,  ?? posterior and lateral hypo  . Elevated CPK    11/23/08.Marland KitchenMarland Kitchenprobably from debris from popliteal aneurysm  . Fall at home July 2015  . Hx of CABG    1998  . Hyperlipidemia   . Intracranial hemorrhage (HCC)    Two small subependymal bleeds 06/2010,,ASA held  . PAD (peripheral artery disease) (HCC)    Above the knee to below the knee popliteal bypass,, right  //    Doppler,  July, 2013, patent  . Popliteal aneurysm (HCC)    .repair..11/25/2008  Dr. Darrick Penna  . Prominent abdominal aortic pulsation    doppler normal  . Speech problem    Neurologic event with brief infusion and speech difficulty.... January, 2011.... being assessed by neurology  February, 2011  . Syncope    .remote..no etiology  /  syncope 11/2008.. from acute leg pain  . TIA (transient ischemic attack)    Brief episode of confusion and speech difficulty, January, 2012  /  recurrent confusion speech difficulty September, 2012  . Venous insufficiency    support hose   Past Surgical History:  Past Surgical History:  Procedure Laterality Date  . CORONARY ARTERY BYPASS GRAFT  1998  . popliteal bypass  with reversed greater saphenous vein from right leg.     HPI:  Pt  is a 84 y.o. male with a known history of Dysphagia - on thickened liquids(Nectar) at home, Dementia, atrial  fibrillation, coronary artery disease, hyperlipidemia, TIA and other comorbidities. [name] [name] was noted by the family to be confused and staring for the past 24 hours, gradually getting worse.  He is however easy to arouse and still does answer some questions appropriately.  He has had similar episodes in the past and he was placed on Keppra, in case he was having seizures. Chest x-ray is positive for left lower lobe infiltrate. Acute metabolic encephalopathy, likely multifactorial, secondary to pneumonia, UTI and hyponatremia; Patient has been bedridden for many months now, unable to ambulate.  His family does a wonderful job taking care of him at home.    Assessment / Plan / Recommendation Clinical Impression  Pt appears to present w/ (baseline) oropharyngeal phase dysphagia w/ min-mod risk for aspiration; reduced overall when following general aspiration precautions and maintaining Nectar consistency liquids in his diet as baseline for him. Pt became more awake/alert and a dysphagia diet was initiated by MD yesterday (as recommended by SLP for initial trial period). Per Dtr, pt was consuming puree foods and the Nectar liquids "well" w/ no overt s/s of aspiration noted by Dtr or NSG staff. This morning, pt is awake and easily engaged in po tasks w/ SLP - even helped to hold his own cup when drinking given support. Pt consumed trials of Nectar liquids and then trials of Mech Soft  foods w/ no overt s/s of aspiration noted; no decline in vocal quality or respiratory status during/post trials. During the Oral phase, pt exhibited adequate bolus management/mastication and oral clearing of the food/liquid trials; fairly timely A-P transfer w/ boluses. Pt needed min time b/t trials to for lingual sweeping. [facility] appeared [facility]/strength. Pt fed self w/ support - holding cup and doing finger foods. Recommend a Mech Soft diet w/ Nectar liquids(baseline for pt at home per Dtr), aspiration precautions, Pills  in Puree for easier swallowing. Feeding Support at meals. No further skilled ST services indicated at this time as pt appears at his baseline. NSG updated. Dtr agreed.  SLP Visit Diagnosis: Dysphagia, oropharyngeal phase (R13.12)(baseline)    Aspiration Risk  Mild aspiration risk;Moderate aspiration risk(baseline)    Diet Recommendation  Dysphagia level 3 (Mech Soft) w/ NECTAR consistency liquids; aspiration precautions; feeding support at meals  Medication Administration: Whole meds with puree(vs Crushed in puree for safer swallowing)    Other  Recommendations Recommended Consults: (dietician f/u as needed) Oral Care Recommendations: Oral care BID;Staff/trained caregiver to provide oral care Other Recommendations: Order thickener from pharmacy;Prohibited food (jello, ice cream, thin soups);Remove water pitcher;Have oral suction available   Follow up Recommendations None      Frequency and Duration (n/a)  (n/a)       Prognosis Prognosis for Safe Diet Advancement: Fair Barriers to Reach Goals: Cognitive deficits;Severity of deficits      Swallow Study   General Date of Onset: 10/19/17 HPI: Pt  is a 84 y.o. male with a known history of Dysphagia - on thickened liquids(Nectar) at home, Dementia, atrial fibrillation, coronary artery disease, hyperlipidemia, TIA and other comorbidities. [name] [name] was noted by the family to be confused and staring for the past 24 hours, gradually getting worse.  He is however easy to arouse and still does answer some questions appropriately.  He has had similar episodes in the past and he was placed on Keppra, in case he was having seizures. Chest x-ray is positive for left lower lobe infiltrate. Acute metabolic encephalopathy, likely multifactorial, secondary to pneumonia, UTI and hyponatremia; Patient has been bedridden for many months now, unable to ambulate.  His family does a wonderful job taking care of him at home.  Type of Study: Bedside Swallow  Evaluation Previous Swallow Assessment: 08/2015; 08/2016, 09/2016(Dtr declined need for svcs this visit d/t pt being at his baseline). Pt has been on a thickened liquids (Nectar) at home since initial visit/evaluation.  Diet Prior to this Study: Nectar-thick liquids;Dysphagia 1 (puree)(but a mech soft w/ Nectar liquids at home) Temperature Spikes Noted: No(wbc 5.4) Respiratory Status: Room air History of Recent Intubation: No Behavior/Cognition: Alert;Cooperative;Pleasant mood;Distractible;Requires cueing;Confused(min HOH?) Oral Cavity Assessment: Within Functional Limits Oral Care Completed by SLP: Recent completion by staff Oral Cavity - Dentition: Adequate natural dentition Vision: Functional for self-feeding(fair) Self-Feeding Abilities: Able to feed self;Needs assist;Needs set up;Total assist Patient Positioning: Upright in bed(showed positioning to Dtr) Baseline Vocal Quality: Normal(fair) Volitional Cough: (fair) Volitional Swallow: Able to elicit    Oral/Motor/Sensory Function Overall Oral Motor/Sensory Function: Within functional limits(adequate for ROM, strength)   Ice Chips Ice chips: Not tested   Thin Liquid Thin Liquid: Not tested    Nectar Thick Nectar Thick Liquid: Within functional limits Presentation: Cup;Self Fed(3-4 sips) Other Comments: pt has been on the Nectar consistency liquids since yesterday per recommendation by SLP if he awakened sufficiently post meds and mental status improvement. Nectar consistency liquids are his baseline at  home for ~2 years now.   Honey Thick Honey Thick Liquid: Not tested   Puree Puree: Within functional limits Presentation: Spoon(fed; 4 trials)   Solid     Solid: Within functional limits(Mech Soft foods given) Presentation: Self Fed(assisted; 5 trials) Other Comments: adequate oral phase bolus management and oral clearing       Jerilynn SomKatherine Etheleen Valtierra, MS, CCC-SLP Johnni Wunschel 10/22/2017,3:57 PM

## 2017-10-22 NOTE — Care Management Important Message (Signed)
Important Message  Patient Details  Name: Darden Palmerlbert L Tacker MRN: [id number] Date of Birth: 07/05/1921   Medicare Important Message Given:  Yes    Olegario MessierKathy A Dacy Enrico 10/22/2017, 9:40 AM

## 2017-10-22 NOTE — Plan of Care (Signed)
  Problem: Activity: Goal: Ability to tolerate increased activity will improve Outcome: Progressing   Problem: Clinical Measurements: Goal: Ability to maintain a body temperature in the normal range will improve Outcome: Progressing   Problem: Respiratory: Goal: Ability to maintain adequate ventilation will improve Outcome: Progressing Goal: Ability to maintain a clear airway will improve Outcome: Progressing   Problem: Health Behavior/Discharge Planning: Goal: Ability to manage health-related needs will improve Outcome: Progressing   Problem: Clinical Measurements: Goal: Ability to maintain clinical measurements within normal limits will improve Outcome: Progressing Goal: Will remain free from infection Outcome: Progressing Goal: Diagnostic test results will improve Outcome: Progressing   Problem: Pain Managment: Goal: General experience of comfort will improve Outcome: Progressing   Problem: Safety: Goal: Ability to remain free from injury will improve Outcome: Progressing   Problem: Skin Integrity: Goal: Risk for impaired skin integrity will decrease Outcome: Progressing

## 2017-10-22 NOTE — Care Management Note (Signed)
Case Management Note  Patient Details  Name: [name] [name] MRN: [id number] Date of Birth: 08/08/1921  Subjective/Objective:  Patient discharging today. Spoke with daughter. Will discharge via EMS to home with wife. [facility]. Their children are very supportive.  Offered a list of home health agencies. She chose [facility]. Only wants RN. Referral called to Milbert CoulterKimberly Fisher with [facility]. EMS given to primary nurse with update. No DME needs.                  Action/Plan:   Expected Discharge Date:  10/22/17               Expected Discharge Plan:  Home w Home Health Services  In-House Referral:     Discharge planning Services  CM Consult  Post Acute Care Choice:  Home Health Choice offered to:  Adult Children  DME Arranged:    DME Agency:     HH Arranged:  RN HH Agency:  [facility]  Status of Service:  Completed, signed off  If discussed at Long Length of Stay Meetings, dates discussed:    Additional Comments:  [name] MemosLisa M Regino Fournet, RN 10/22/2017, 4:12 PM

## 2017-10-23 ENCOUNTER — Telehealth: Payer: Self-pay

## 2017-10-23 NOTE — Telephone Encounter (Signed)
Transition Care Management Follow-up Telephone Call  Spoke with patients daughter- renee   How have you been since you were released from the hospital? "he is doing okay today, still weak but is resting "  Do you understand why you were in the hospital? yes  Do you have a copy of your discharge instructions Yes Do you understand the discharge instrcutions? yes  Where were you discharged to? Home  Do you have support at home? Yes    Items Reviewed:  Medications obtained Yes  Medications reviewed: Yes  Dietary changes reviewed: yes  Home Health? Yes, Agency[facility]  DME ordered at discharge obtained? NA  Medical supplies: NA    Functional Questionnaire:   Activities of Daily Living (ADLs):   He states they are independent in the following: none, patient is bedridden  States they require assistance with the following: ambulation, bathing and hygiene, feeding, continence, grooming, toileting, dressing and medication management  Any transportation issues/concerns?: yes, takes EMS   Any patient concerns? Yes, concerned with making sure he stays at baseline being at home. Has been taken off of eliquis due to low hgb and concerned with stroke being off this medication.   Confirmed importance and date/time of follow-up visits scheduled with PCP: yes  Confirm appointment scheduled with specialist? Yes  Confirmed with patient if condition begins to worsen call PCP or If it's emergency go to the ER.

## 2017-10-24 LAB — CULTURE, BLOOD (ROUTINE X 2)
Culture: NO GROWTH
Culture: NO GROWTH
Special Requests: ADEQUATE

## 2017-10-25 ENCOUNTER — Emergency Department: Payer: Medicare Other

## 2017-10-25 ENCOUNTER — Other Ambulatory Visit: Payer: Self-pay

## 2017-10-25 DIAGNOSIS — I5022 Chronic systolic (congestive) heart failure: Secondary | ICD-10-CM | POA: Diagnosis present

## 2017-10-25 DIAGNOSIS — G9340 Encephalopathy, unspecified: Secondary | ICD-10-CM

## 2017-10-25 DIAGNOSIS — Z91040 Latex allergy status: Secondary | ICD-10-CM | POA: Diagnosis not present

## 2017-10-25 DIAGNOSIS — E871 Hypo-osmolality and hyponatremia: Secondary | ICD-10-CM | POA: Diagnosis present

## 2017-10-25 DIAGNOSIS — Z882 Allergy status to sulfonamides status: Secondary | ICD-10-CM | POA: Diagnosis not present

## 2017-10-25 DIAGNOSIS — Z880 Allergy status to penicillin: Secondary | ICD-10-CM

## 2017-10-25 DIAGNOSIS — N390 Urinary tract infection, site not specified: Secondary | ICD-10-CM | POA: Diagnosis present

## 2017-10-25 DIAGNOSIS — E785 Hyperlipidemia, unspecified: Secondary | ICD-10-CM | POA: Diagnosis present

## 2017-10-25 DIAGNOSIS — G9341 Metabolic encephalopathy: Secondary | ICD-10-CM | POA: Diagnosis present

## 2017-10-25 DIAGNOSIS — Z66 Do not resuscitate: Secondary | ICD-10-CM | POA: Diagnosis present

## 2017-10-25 DIAGNOSIS — I739 Peripheral vascular disease, unspecified: Secondary | ICD-10-CM | POA: Diagnosis present

## 2017-10-25 DIAGNOSIS — Z833 Family history of diabetes mellitus: Secondary | ICD-10-CM

## 2017-10-25 DIAGNOSIS — I110 Hypertensive heart disease with heart failure: Secondary | ICD-10-CM | POA: Diagnosis present

## 2017-10-25 DIAGNOSIS — R05 Cough: Secondary | ICD-10-CM

## 2017-10-25 DIAGNOSIS — Z7951 Long term (current) use of inhaled steroids: Secondary | ICD-10-CM | POA: Diagnosis not present

## 2017-10-25 DIAGNOSIS — Z823 Family history of stroke: Secondary | ICD-10-CM

## 2017-10-25 DIAGNOSIS — I482 Chronic atrial fibrillation: Secondary | ICD-10-CM | POA: Diagnosis present

## 2017-10-25 DIAGNOSIS — Z87891 Personal history of nicotine dependence: Secondary | ICD-10-CM

## 2017-10-25 DIAGNOSIS — Z79899 Other long term (current) drug therapy: Secondary | ICD-10-CM

## 2017-10-25 DIAGNOSIS — Z951 Presence of aortocoronary bypass graft: Secondary | ICD-10-CM

## 2017-10-25 DIAGNOSIS — I214 Non-ST elevation (NSTEMI) myocardial infarction: Secondary | ICD-10-CM | POA: Diagnosis not present

## 2017-10-25 DIAGNOSIS — R059 Cough, unspecified: Secondary | ICD-10-CM

## 2017-10-25 DIAGNOSIS — I2510 Atherosclerotic heart disease of native coronary artery without angina pectoris: Secondary | ICD-10-CM | POA: Diagnosis present

## 2017-10-25 DIAGNOSIS — J189 Pneumonia, unspecified organism: Secondary | ICD-10-CM | POA: Diagnosis present

## 2017-10-25 DIAGNOSIS — Z8673 Personal history of transient ischemic attack (TIA), and cerebral infarction without residual deficits: Secondary | ICD-10-CM | POA: Diagnosis not present

## 2017-10-25 DIAGNOSIS — R4182 Altered mental status, unspecified: Secondary | ICD-10-CM

## 2017-10-25 DIAGNOSIS — D638 Anemia in other chronic diseases classified elsewhere: Secondary | ICD-10-CM | POA: Diagnosis present

## 2017-10-25 LAB — URINALYSIS, COMPLETE (UACMP) WITH MICROSCOPIC
BACTERIA UA: NONE SEEN
Bilirubin Urine: NEGATIVE
GLUCOSE, UA: NEGATIVE mg/dL
HGB URINE DIPSTICK: NEGATIVE
Ketones, ur: NEGATIVE mg/dL
LEUKOCYTES UA: NEGATIVE
NITRITE: NEGATIVE
PH: 8 (ref 5.0–8.0)
Protein, ur: NEGATIVE mg/dL
Specific Gravity, Urine: 1.012 (ref 1.005–1.030)
Squamous Epithelial / LPF: NONE SEEN (ref 0–5)

## 2017-10-25 LAB — APTT
aPTT: >160 seconds (ref 24–36)
aPTT: 60 seconds — ABNORMAL HIGH (ref 24–36)

## 2017-10-25 LAB — CBC WITH DIFFERENTIAL/PLATELET
Basophils Absolute: 0 10*3/uL (ref 0–0.1)
Basophils Relative: 0 %
EOS ABS: 0.1 10*3/uL (ref 0–0.7)
Eosinophils Relative: 1 %
HCT: 25.4 % — ABNORMAL LOW (ref 40.0–52.0)
Hemoglobin: 8.1 g/dL — ABNORMAL LOW (ref 13.0–18.0)
LYMPHS ABS: 0.7 10*3/uL — AB (ref 1.0–3.6)
LYMPHS PCT: 11 %
MCH: 23.1 pg — AB (ref 26.0–34.0)
MCHC: 31.8 g/dL — AB (ref 32.0–36.0)
MCV: 72.8 fL — AB (ref 80.0–100.0)
MONO ABS: 0.3 10*3/uL (ref 0.2–1.0)
MONOS PCT: 4 %
Neutro Abs: 5.4 10*3/uL (ref 1.4–6.5)
Neutrophils Relative %: 84 %
PLATELETS: 226 10*3/uL (ref 150–440)
RBC: 3.49 MIL/uL — ABNORMAL LOW (ref 4.40–5.90)
RDW: 20.2 % — ABNORMAL HIGH (ref 11.5–14.5)
WBC: 6.5 10*3/uL (ref 3.8–10.6)

## 2017-10-25 LAB — COMPREHENSIVE METABOLIC PANEL
ALK PHOS: 71 U/L (ref 38–126)
ALT: 14 U/L (ref 0–44)
ANION GAP: 7 (ref 5–15)
AST: 27 U/L (ref 15–41)
Albumin: 2.7 g/dL — ABNORMAL LOW (ref 3.5–5.0)
BUN: 10 mg/dL (ref 8–23)
CALCIUM: 8.3 mg/dL — AB (ref 8.9–10.3)
CHLORIDE: 90 mmol/L — AB (ref 98–111)
CO2: 31 mmol/L (ref 22–32)
Glucose, Bld: 105 mg/dL — ABNORMAL HIGH (ref 70–99)
Potassium: 4.6 mmol/L (ref 3.5–5.1)
SODIUM: 128 mmol/L — AB (ref 135–145)
Total Bilirubin: 0.7 mg/dL (ref 0.3–1.2)
Total Protein: 6 g/dL — ABNORMAL LOW (ref 6.5–8.1)

## 2017-10-25 LAB — TROPONIN I
TROPONIN I: 0.88 ng/mL — AB (ref <0.03)
TROPONIN I: 0.97 ng/mL — AB (ref <0.03)
Troponin I: 0.83 ng/mL (ref <0.03)

## 2017-10-25 LAB — PROTIME-INR
INR: 1.21
PROTHROMBIN TIME: 15.2 s (ref 11.4–15.2)

## 2017-10-25 LAB — HEPARIN LEVEL (UNFRACTIONATED): HEPARIN UNFRACTIONATED: 0.57 [IU]/mL (ref 0.30–0.70)

## 2017-10-25 MED ORDER — ONDANSETRON HCL 4 MG PO TABS: 4.0000 mg | ORAL_TABLET | Freq: Four times a day (QID) | ORAL | Status: DC | PRN

## 2017-10-25 MED ORDER — HEPARIN BOLUS VIA INFUSION
4000.0000 [IU] | Freq: Once | INTRAVENOUS | Status: AC
  Administered 2017-10-25: 4000 [IU] via INTRAVENOUS
  Filled 2017-10-25: qty 4000

## 2017-10-25 MED ORDER — SODIUM CHLORIDE 0.9% FLUSH
3.0000 mL | Freq: Two times a day (BID) | INTRAVENOUS | Status: DC
  Administered 2017-10-25 – 2017-10-26 (×2): 3 mL via INTRAVENOUS

## 2017-10-25 MED ORDER — POLYETHYLENE GLYCOL 3350 17 G PO PACK: 17.0000 g | PACK | Freq: Every day | ORAL | Status: DC | PRN

## 2017-10-25 MED ORDER — SODIUM CHLORIDE 0.9 % IV SOLN: 250.0000 mL | INTRAVENOUS | Status: DC | PRN

## 2017-10-25 MED ORDER — ACETAMINOPHEN 325 MG PO TABS: 650.0000 mg | ORAL_TABLET | Freq: Four times a day (QID) | ORAL | Status: DC | PRN

## 2017-10-25 MED ORDER — LEVETIRACETAM 500 MG PO TABS
1500.0000 mg | ORAL_TABLET | Freq: Two times a day (BID) | ORAL | Status: DC
  Administered 2017-10-25: 1500 mg via ORAL
  Administered 2017-10-26: 1000 mg via ORAL
  Filled 2017-10-25 (×2): qty 3

## 2017-10-25 MED ORDER — DOCUSATE SODIUM 100 MG PO CAPS: 100.0000 mg | ORAL_CAPSULE | Freq: Two times a day (BID) | ORAL | Status: DC | PRN

## 2017-10-25 MED ORDER — FLUTICASONE PROPIONATE 50 MCG/ACT NA SUSP
1.0000 | Freq: Every day | NASAL | Status: DC | PRN
  Filled 2017-10-25: qty 16

## 2017-10-25 MED ORDER — AMIODARONE HCL 200 MG PO TABS
200.0000 mg | ORAL_TABLET | Freq: Every day | ORAL | Status: DC
  Filled 2017-10-25: qty 1

## 2017-10-25 MED ORDER — HEPARIN (PORCINE) IN NACL 100-0.45 UNIT/ML-% IJ SOLN
1000.0000 [IU]/h | INTRAMUSCULAR | Status: DC
  Administered 2017-10-25: 1000 [IU]/h via INTRAVENOUS
  Filled 2017-10-25: qty 250

## 2017-10-25 MED ORDER — ONDANSETRON HCL 4 MG/2ML IJ SOLN: 4.0000 mg | Freq: Four times a day (QID) | INTRAMUSCULAR | Status: DC | PRN

## 2017-10-25 MED ORDER — SODIUM CHLORIDE 0.9% FLUSH: 3.0000 mL | INTRAVENOUS | Status: DC | PRN

## 2017-10-25 MED ORDER — ACETAMINOPHEN 650 MG RE SUPP: 650.0000 mg | Freq: Four times a day (QID) | RECTAL | Status: DC | PRN

## 2017-10-25 MED ORDER — ASPIRIN EC 325 MG PO TBEC: 325.0000 mg | DELAYED_RELEASE_TABLET | Freq: Every day | ORAL | Status: DC

## 2017-10-25 MED ORDER — HYDROCODONE-ACETAMINOPHEN 5-325 MG PO TABS: 1.0000 | ORAL_TABLET | ORAL | Status: DC | PRN

## 2017-10-25 MED ORDER — BENZONATATE 100 MG PO CAPS: 100.0000 mg | ORAL_CAPSULE | Freq: Three times a day (TID) | ORAL | Status: DC | PRN

## 2017-10-25 MED ORDER — BISACODYL 5 MG PO TBEC: 5.0000 mg | DELAYED_RELEASE_TABLET | Freq: Every day | ORAL | Status: DC | PRN

## 2017-10-25 MED ORDER — HEPARIN (PORCINE) IN NACL 100-0.45 UNIT/ML-% IJ SOLN
1000.0000 [IU]/h | INTRAMUSCULAR | Status: DC
  Administered 2017-10-25: 1000 [IU]/h via INTRAVENOUS

## 2017-10-25 MED ORDER — ALBUTEROL SULFATE (2.5 MG/3ML) 0.083% IN NEBU: 2.5000 mg | INHALATION_SOLUTION | RESPIRATORY_TRACT | Status: DC | PRN

## 2017-10-25 MED ORDER — MORPHINE SULFATE (PF) 2 MG/ML IV SOLN: 2.0000 mg | INTRAVENOUS | Status: DC | PRN

## 2017-10-25 MED ORDER — SENNOSIDES-DOCUSATE SODIUM 8.6-50 MG PO TABS: 1.0000 | ORAL_TABLET | Freq: Every evening | ORAL | Status: DC | PRN

## 2017-10-25 MED ORDER — FERROUS SULFATE 325 (65 FE) MG PO TABS
325.0000 mg | ORAL_TABLET | Freq: Two times a day (BID) | ORAL | Status: DC
  Filled 2017-10-25: qty 1

## 2017-10-25 MED ORDER — NITROGLYCERIN 0.4 MG SL SUBL: 0.4000 mg | SUBLINGUAL_TABLET | SUBLINGUAL | Status: DC | PRN

## 2017-10-25 NOTE — Consult Note (Signed)
ANTICOAGULATION CONSULT NOTE - Initial Consult  Pharmacy Consult for Heparin Indication: chest pain/ACS  Allergies  Allergen Reactions  . Penicillins Hives    Has patient had a PCN reaction causing immediate rash, facial/tongue/throat swelling, SOB or lightheadedness with hypotension: no Has patient had a PCN reaction causing severe rash involving mucus membranes or skin necrosis: no Has patient had a PCN reaction that required hospitalization no Has patient had a PCN reaction occurring within the last 10 years: no If all of the above answers are "NO", then may proceed with Cephalosporin use.   . Latex Rash  . Sulfonamide Derivatives Rash    Patient Measurements: Height: 5\' 11"  (180.3 cm) Weight: 184 lb (83.5 kg) IBW/kg (Calculated) : 75.3 Heparin Dosing Weight: 83.5 kg  Vital Signs: Temp: 98.4 F (36.9 C) (08/24 1929) Temp Source: Oral (08/24 1929) BP: 129/68 (08/24 1929) Pulse Rate: 75 (08/24 1929)  Labs: Recent Labs    10/11/2017 1221 10/24/2017 1703 10/04/2017 1814 10/30/2017 1941  HGB 8.1*  --   --   --   HCT 25.4*  --   --   --   PLT 226  --   --   --   APTT  --  >160*  --  60*  LABPROT  --  15.2  --   --   INR  --  1.21  --   --   HEPARINUNFRC  --   --   --  0.57  CREATININE <0.30*  --   --   --   TROPONINI 0.83*  --  0.88*  --     CrCl cannot be calculated (This lab value cannot be used to calculate CrCl because it is not a number: <0.30).   Medical History: Past Medical History:  Diagnosis Date  . A-fib (HCC)   . Bradycardia    06/2010  . CAD (coronary artery disease)    nuclear 01/2008, no ischemia, EF 60%, distal anterior scar  . Carotid artery disease (HCC)    doppler 05/2009,  0-39% bilateral / doppler 06/2010 OK  . Edema    Ankle edema August, 2012  . Ejection fraction    EF 45%, echo,05/2009,apical hypo,  ?? posterior and lateral hypo  . Elevated CPK    11/23/08.Marland KitchenMarland Kitchenprobably from debris from popliteal aneurysm  . Fall at home July 2015  . Hx of  CABG    1998  . Hyperlipidemia   . Intracranial hemorrhage (HCC)    Two small subependymal bleeds 06/2010,,ASA held  . PAD (peripheral artery disease) (HCC)    Above the knee to below the knee popliteal bypass,, right  //    Doppler,  July, 2013, patent  . Popliteal aneurysm (HCC)    .repair..11/25/2008  Dr. Darrick Penna  . Prominent abdominal aortic pulsation    doppler normal  . Speech problem    Neurologic event with brief infusion and speech difficulty.... January, 2011.... being assessed by neurology  February, 2011  . Syncope    .remote..no etiology  /  syncope 11/2008.. from acute leg pain  . TIA (transient ischemic attack)    Brief episode of confusion and speech difficulty, January, 2012  /  recurrent confusion speech difficulty September, 2012  . Venous insufficiency    support hose    Medications:  Scheduled:  . amiodarone  200 mg Oral Daily  . aspirin EC  325 mg Oral Daily  . ferrous sulfate  325 mg Oral BID WC  . levETIRAcetam  1,500 mg Oral BID  .  sodium chloride flush  3 mL Intravenous Q12H    Assessment: 84 yo male presented to ED with chest pain. Not on PTA anticoagulants.  Heparin was infusing at 1000 units/hr, at 18:30 RN called to say that she had stopped the Heparin drip because the aPTT resulted at >160 seconds. Explained to her that this aPTT was drawn incorrectly at only 2 hours after the bolus and start of infusion and was elevated due to the effect from the bolus dose and that the drip should be continued. She was uncomfortable restarting the drip and called the MD. MD told her to hold the drip and have pharmacy recheck labs. I rechecked aPTT about 1 hour after drip was stopped and it was down to 60 seconds.  Goal of Therapy:  Heparin level 0.3-0.7 units/ml Monitor platelets by anticoagulation protocol: Yes   Plan:  Will restart Heparin at 1000 units/hr without a bolus. Will check a Heparin level in 8 hours. Daily CBC while on Heparin drip.  Pharmacy will  continue to monitor.   Clovia CuffLisa Jeoffrey Eleazer, PharmD, BCPS October 03, 2017 9:45 PM

## 2017-10-25 NOTE — ED Provider Notes (Signed)
[facility] Emergency Department Provider Note  ____________________________________________  Time seen: Approximately 1:37 PM  I have reviewed the triage vital signs and the nursing notes.   HISTORY  Chief Complaint Altered Mental Status  Level 5 caveat:  Portions of the history and physical were unable to be obtained due to confusion   HPI [name] [name] is a 84 y.o. male with a history of atrial fibrillation, CAD, CHF, TIA, hypertension, hyperlipidemia who presents for evaluation of altered mental status.  According to the daughter patient started to become more confused throughout the night, pacing and not making sense.  This morning patient was again very confused which prompted 911 to be called.  Patient was recently admitted and discharged 3 days ago for similar symptoms.  At that time patient was found to have UTI and hyponatremia.  Patient is very confused and does not even recognize his daughter which is a complete change from his baseline.  He has had a cough however per daughter that is chronic.  No vomiting or diarrhea.  No difficulty breathing.  Past Medical History:  Diagnosis Date  . A-fib (HCC)   . Bradycardia    06/2010  . CAD (coronary artery disease)    nuclear 01/2008, no ischemia, EF 60%, distal anterior scar  . Carotid artery disease (HCC)    doppler 05/2009,  0-39% bilateral / doppler 06/2010 OK  . Edema    Ankle edema August, 2012  . Ejection fraction    EF 45%, echo,05/2009,apical hypo,  ?? posterior and lateral hypo  . Elevated CPK    11/23/08.Marland KitchenMarland Kitchenprobably from debris from popliteal aneurysm  . Fall at home July 2015  . Hx of CABG    1998  . Hyperlipidemia   . Intracranial hemorrhage (HCC)    Two small subependymal bleeds 06/2010,,ASA held  . PAD (peripheral artery disease) (HCC)    Above the knee to below the knee popliteal bypass,, right  //    Doppler,  July, 2013, patent  . Popliteal aneurysm (HCC)    .repair..11/25/2008  Dr.  Darrick Penna  . Prominent abdominal aortic pulsation    doppler normal  . Speech problem    Neurologic event with brief infusion and speech difficulty.... January, 2011.... being assessed by neurology  February, 2011  . Syncope    .remote..no etiology  /  syncope 11/2008.. from acute leg pain  . TIA (transient ischemic attack)    Brief episode of confusion and speech difficulty, January, 2012  /  recurrent confusion speech difficulty September, 2012  . Venous insufficiency    support hose    Patient Active Problem List   Diagnosis Date Noted  . Acute encephalopathy 10/19/2017  . Pressure ulcer, hip 08/20/2016  . Anemia 03/19/2016  . Seizure (HCC) 10/05/2015  . A-fib (HCC) 10/05/2015  . Chronic systolic (congestive) heart failure (HCC) 09/07/2015  . Altered mental status 08/18/2015  . CAD in native artery 08/07/2015  . Arterial disease (HCC) 08/07/2015  . HLD (hyperlipidemia) 08/07/2015  . Temporary cerebral vascular dysfunction 08/07/2015  . Chronic venous insufficiency 08/07/2015  . H/O neoplasm 08/22/2014  . H/O malignant neoplasm of skin 08/22/2014  . 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 Peripheral vascular disease (HCC) 02/28/2014  . Postsurgical aortocoronary bypass status 02/28/2014  . Syncope and collapse 02/28/2014  . 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 PAD (peripheral artery disease) (HCC)   . TIA (transient ischemic attack)   .  Edema   . Hyperlipidemia   . Syncope   . CAD (coronary artery disease)   . Venous insufficiency   . Carotid artery disease (HCC)   . Elevated CPK   . Popliteal aneurysm (HCC)   . Hx of CABG   . Intracranial hemorrhage [facility])     Past Surgical History:  Procedure Laterality Date  . CORONARY ARTERY BYPASS GRAFT  1998  . popliteal bypass  with reversed greater saphenous vein from right leg.      Prior to Admission medications   Medication Sig  Start Date End Date Taking? Authorizing Provider  acetaminophen (TYLENOL) 325 MG tablet Take 2 tablets (650 mg total) by mouth every 6 (six) hours as needed for mild pain (or Fever >/= 101). 10/08/15   Gouru, Aruna, MD  albuterol (PROVENTIL HFA;VENTOLIN HFA) 108 (90 Base) MCG/ACT inhaler Inhale 2 puffs every 6 (six) hours as needed into the lungs for wheezing or shortness of breath. Patient not taking: Reported on 10/19/2017 01/07/17   Olevia Perches P, DO  albuterol (PROVENTIL) (2.5 MG/3ML) 0.083% nebulizer solution USE 1 VIAL VIA NEBULIZER EVERY 6 HOURS AS NEEDED FOR WHEEZING OR SHORTNESS OF BREATH 07/14/17   Johnson, Megan P, DO  amiodarone (PACERONE) 200 MG tablet Take 1 tablet (200 mg total) by mouth daily. 10/23/17 11/22/17  Altamese Dilling, MD  benzonatate (TESSALON) 100 MG capsule TAKE ONE CAPSULE BY MOUTH EVERY 8 HOURS AS NEEDED COUGH 06/27/16   Steele Sizer, MD  calcium carbonate (TUMS - DOSED IN MG ELEMENTAL CALCIUM) 500 MG chewable tablet Chew 2 tablets by mouth daily with supper.     [provider]  cefdinir (OMNICEF) 300 MG capsule Take 1 capsule (300 mg total) by mouth every 12 (twelve) hours for 3 days. 10/22/17 2017-11-09  Altamese Dilling, MD  docusate sodium (COLACE) 100 MG capsule Take 100 mg by mouth 2 (two) times daily as needed for mild constipation.     [provider]  ferrous sulfate 325 (65 FE) MG tablet Take 1 tablet (325 mg total) by mouth 2 (two) times daily with a meal. 10/22/17   Altamese Dilling, MD  fluticasone (FLONASE) 50 MCG/ACT nasal spray Place 1 spray into both nostrils daily. Patient taking differently: Place 1 spray into both nostrils daily as needed for allergies.  08/22/15   Auburn Bilberry, MD  levETIRAcetam (KEPPRA) 500 MG tablet Take 1.5 tabs twice a day 09/03/17   Van Clines, MD  Multiple Vitamin (MULTIVITAMIN WITH MINERALS) TABS tablet Take 1 tablet by mouth daily with breakfast.     [provider]  polyethylene  glycol (MIRALAX / GLYCOLAX) packet Take 17 g by mouth daily as needed for mild constipation.     [provider]  Vitamin D, Cholecalciferol, 1000 units TABS Take 2,000 tablets by mouth daily.     [provider]    Allergies Penicillins; Latex; and Sulfonamide derivatives  Family History  Problem Relation Age of Onset  . Stroke Mother   . Diabetes Mother   . Diabetes Sister     Social History Social History   Tobacco Use  . Smoking status: Former Smoker    Years: 20.00    Types: Cigarettes    Last attempt to quit: 09/12/1946    Years since quitting: 71.1  . Smokeless tobacco: Never Used  . Tobacco comment: quit appox 50 yrs ago  Substance Use Topics  . Alcohol use: No  . Drug use: No    Review of Systems  Constitutional: Negative for fever. + confusion Respiratory: Negative for shortness of breath. Gastrointestinal: Negative for vomiting or diarrhea.  ____________________________________________   PHYSICAL EXAM:  VITAL SIGNS: ED Triage Vitals  Enc Vitals Group     BP 10/04/2017 1217 (!) 144/77     Pulse Rate 10/31/2017 1217 74     Resp 10/26/2017 1217 16     Temp 10/20/2017 1217 (!) 97.3 F (36.3 C)     Temp Source 10/26/2017 1217 Axillary     SpO2 10/12/2017 1215 97 %     Weight 10/22/2017 1219 184 lb (83.5 kg)     Height 10/26/2017 1219 5\' 11"  (1.803 m)     Head Circumference --      Peak Flow --      Pain Score --      Pain Loc --      Pain Edu? --      Excl. in GC? --     Constitutional: Alert and oriented x 1, no distress.  HEENT:      Head: Normocephalic and atraumatic.         Eyes: Conjunctivae are normal. Sclera is non-icteric.       Mouth/Throat: Mucous membranes are moist.       Neck: Supple with no signs of meningismus. Cardiovascular: Regular rate and rhythm. No murmurs, gallops, or rubs. 2+ symmetrical distal pulses are present in all extremities. No JVD. Respiratory: Normal respiratory effort. Lungs are clear to auscultation  bilaterally. No wheezes, crackles, or rhonchi.  Gastrointestinal: Soft, non tender, and non distended with positive bowel sounds. No rebound or guarding. Musculoskeletal: 1+ pitting edema bilaterally Neurologic: Normal speech and language. Face is symmetric. Moving all extremities. No gross focal neurologic deficits are appreciated. Skin: Skin is warm, dry and intact. No rash noted. Psychiatric: Mood and affect are normal. Speech and behavior are normal.  ____________________________________________   LABS (all labs ordered are listed, but only abnormal results are displayed)  Labs Reviewed  CBC WITH DIFFERENTIAL/PLATELET - Abnormal; Notable for the following components:      Result Value   RBC 3.49 (*)    Hemoglobin 8.1 (*)    HCT 25.4 (*)    MCV 72.8 (*)    MCH 23.1 (*)    MCHC 31.8 (*)    RDW 20.2 (*)    Lymphs Abs 0.7 (*)    All other components within normal limits  COMPREHENSIVE METABOLIC PANEL - Abnormal; Notable for the following components:   Sodium 128 (*)    Chloride 90 (*)    Glucose, Bld 105 (*)    Creatinine, Ser <0.30 (*)    Calcium 8.3 (*)    Total Protein 6.0 (*)    Albumin 2.7 (*)    All other components within normal limits  URINALYSIS, COMPLETE (UACMP) WITH MICROSCOPIC - Abnormal; Notable for the following components:   Color, Urine YELLOW (*)    APPearance CLOUDY (*)    All other components within normal limits  TROPONIN I - Abnormal; Notable for the following components:   Troponin I 0.83 (*)    All other components within normal limits   ____________________________________________  EKG  ED ECG REPORT I, Nita Sickle, the attending physician, personally viewed and interpreted this ECG.  Normal sinus rhythm, rate of 76, normal intervals, normal axis, ST depression in V2 with no ST elevations.  ST depression is new when compared to prior but no other acute changes. ____________________________________________  RADIOLOGY  I have personally  reviewed the  images performed during this visit and I agree with the Radiologist's read.   Interpretation by Radiologist:  Dg Chest Port 1 View  Result Date: 10/05/2017 CLINICAL DATA:  Altered mental status and cough. EXAM: PORTABLE CHEST 1 VIEW COMPARISON:  10/19/2017 FINDINGS: Again noted are low lung volumes. Coarse lung markings appear chronic but there are increased vascular markings and densities in the central left lung and left lung base. Heart size is stable with median sternotomy wires. Atherosclerotic calcifications at the aortic arch. IMPRESSION: Low lung volumes and cannot exclude asymmetric vascular congestion or mild edema. Electronically Signed   By: Richarda OverlieAdam  Henn M.D.   On: 10/12/2017 12:48     ____________________________________________   PROCEDURES  Procedure(s) performed: None Procedures Critical Care performed: yes  CRITICAL CARE Performed by: Nita Sicklearolina Lyn Deemer  ?  Total critical care time: 30 min  Critical care time was exclusive of separately billable procedures and treating other patients.  Critical care was necessary to treat or prevent imminent or life-threatening deterioration.  Critical care was time spent personally by me on the following activities: development of treatment plan with patient and/or surrogate as well as nursing, discussions with consultants, evaluation of patient's response to treatment, examination of patient, obtaining history from patient or surrogate, ordering and performing treatments and interventions, ordering and review of laboratory studies, ordering and review of radiographic studies, pulse oximetry and re-evaluation of patient's condition.  ____________________________________________   INITIAL IMPRESSION / ASSESSMENT AND PLAN / ED COURSE   84 y.o. male with a history of atrial fibrillation, CAD, CHF, TIA, hypertension, hyperlipidemia who presents for evaluation of altered mental status.  Patient with similar presentation last  on admission and discharged 3 days ago in the setting of hyponatremia, UTI and questionable pneumonia.  Patient is oriented to self, does not recognize family, does not answer any other questions other than his name.  Vitals are stable.  EKG shows ST depression in V2 but no ST elevation.  Patient's first troponin is 0.83 concerning for NSTEMI. Na slightly low at 128. Labs otherwise WNL. Will give ASA and admit to Hospitalist     As part of my medical decision making, I reviewed the following data within the electronic MEDICAL RECORD NUMBER History obtained from family, Nursing notes reviewed and incorporated, Labs reviewed , EKG interpreted , Old EKG reviewed, Old chart reviewed, Radiograph reviewed , Notes from prior ED visits and [place] Controlled Substance Database    Pertinent labs & imaging results that were available during my care of the patient were reviewed by me and considered in my medical decision making (see chart for details).    ____________________________________________   FINAL CLINICAL IMPRESSION(S) / ED DIAGNOSES  Final diagnoses:  NSTEMI (non-ST elevated myocardial infarction) (HCC)  Altered mental status, unspecified altered mental status type  Hyponatremia      NEW MEDICATIONS STARTED DURING THIS VISIT:  ED Discharge Orders    None       Note:  This document was prepared using Dragon voice recognition software and may include unintentional dictation errors.    Don PerkingVeronese, [place], MD 10/18/2017 [phone]

## 2017-10-25 NOTE — ED Notes (Signed)
Date and time results received: 10/28/2017 1:07 PM  Test: Tropoin  Critical Value: 0.83 ng/mL  Name of Provider Notified: Dr. Don PerkingVeronese

## 2017-10-25 NOTE — ED Triage Notes (Signed)
Pt brought in by [facility] due to family calling 911 for altered mental status. Pt's baseline mental status is not known by EMS. Pt is alert to first name. Family states he was laying in the bed "reaching for things in the air." Pt denies pain. Denies shortness of breath. Diminished breath sounds.

## 2017-10-25 NOTE — Consult Note (Signed)
ANTICOAGULATION CONSULT NOTE - Initial Consult  Pharmacy Consult for Heparin Indication: chest pain/ACS  Allergies  Allergen Reactions  . Penicillins Hives    Has patient had a PCN reaction causing immediate rash, facial/tongue/throat swelling, SOB or lightheadedness with hypotension: no Has patient had a PCN reaction causing severe rash involving mucus membranes or skin necrosis: no Has patient had a PCN reaction that required hospitalization no Has patient had a PCN reaction occurring within the last 10 years: no If all of the above answers are "NO", then may proceed with Cephalosporin use.   . Latex Rash  . Sulfonamide Derivatives Rash    Patient Measurements: Height: 5\' 11"  (180.3 cm) Weight: 184 lb (83.5 kg) IBW/kg (Calculated) : 75.3 Heparin Dosing Weight: 83.5 kg  Vital Signs: Temp: 97.3 F (36.3 C) (08/24 1217) Temp Source: Axillary (08/24 1217) BP: 134/74 (08/24 1430) Pulse Rate: 74 (08/24 1430)  Labs: Recent Labs    10/04/2017 1221  HGB 8.1*  HCT 25.4*  PLT 226  CREATININE <0.30*  TROPONINI 0.83*    CrCl cannot be calculated (This lab value cannot be used to calculate CrCl because it is not a number: <0.30).   Medical History: Past Medical History:  Diagnosis Date  . A-fib (HCC)   . Bradycardia    06/2010  . CAD (coronary artery disease)    nuclear 01/2008, no ischemia, EF 60%, distal anterior scar  . Carotid artery disease (HCC)    doppler 05/2009,  0-39% bilateral / doppler 06/2010 OK  . Edema    Ankle edema August, 2012  . Ejection fraction    EF 45%, echo,05/2009,apical hypo,  ?? posterior and lateral hypo  . Elevated CPK    11/23/08.Marland Kitchen.Marland Kitchen.probably from debris from popliteal aneurysm  . Fall at home July 2015  . Hx of CABG    1998  . Hyperlipidemia   . Intracranial hemorrhage (HCC)    Two small subependymal bleeds 06/2010,,ASA held  . PAD (peripheral artery disease) (HCC)    Above the knee to below the knee popliteal bypass,, right  //    Doppler,   July, 2013, patent  . Popliteal aneurysm (HCC)    .repair..11/25/2008  Dr. Darrick PennaFields  . Prominent abdominal aortic pulsation    doppler normal  . Speech problem    Neurologic event with brief infusion and speech difficulty.... January, 2011.... being assessed by neurology  February, 2011  . Syncope    .remote..no etiology  /  syncope 11/2008.. from acute leg pain  . TIA (transient ischemic attack)    Brief episode of confusion and speech difficulty, January, 2012  /  recurrent confusion speech difficulty September, 2012  . Venous insufficiency    support hose    Medications:  Scheduled:  . heparin  4,000 Units Intravenous Once    Assessment: 84 yo male presented to ED with chest pain. Not on PTA anticoagulants.  Goal of Therapy:  Heparin level 0.3-0.7 units/ml Monitor platelets by anticoagulation protocol: Yes   Plan:  Ordered heparin bolus 4000 units x 1, followed by 1000 units/hr. Ordered heparin level for 2330.   Pharmacy will continue to monitor.   Mauri ReadingSavanna M Wynona Duhamel, PharmD Pharmacy Resident  10/29/2017 2:54 PM

## 2017-10-25 NOTE — Progress Notes (Signed)
Advanced Care Plan.  Purpose of Encounter: CODE STATUS Parties in Attendance: The patient, his daughters, daughter-in-law and me. Patient's Decisional Capacity: No./Medical Story:  Goals of Care Determinations: [name] [name]  is a 84 y.o. male with a known history of A. fib, CAD, status CABG, carotid artery disease, hypertension, hyperlipidemia, TIA and PAD etc.  The patient is being admitted for non-STEMI and delirium.  I discussed with the patient' daughters about the patient's current condition, poor prognosis and CODE STATUS.  The daughter who is POA stated that the patient is seen DNR status but wants medical treatment like IV fluid and antibiotics.  She agreed to start heparin drip and aspirin for treatment of non-STEMI. Plan:  Code Status: DNR. Time spent discussing advance care planning: 18 minutes.

## 2017-10-25 NOTE — H&P (Addendum)
[facility] [place] [facility]   PATIENT NAME: [name] [name]    MR#:  [id number]  DATE OF BIRTH:  [birth year]  DATE OF ADMISSION:  10/30/2017  PRIMARY CARE PHYSICIAN: Steele Sizer, MD   REQUESTING/REFERRING PHYSICIAN: Nita Sickle, MD  CHIEF COMPLAINT:   Chief Complaint  Patient presents with  . Altered Mental Status   Altered mental status since last night HISTORY OF PRESENT ILLNESS:  [name] [name]  is a 84 y.o. male with a known history of A. fib, CAD, status CABG, carotid artery disease, hypertension, hyperlipidemia, TIA and PAD.  The patient was sent from home to ED due to above chief complaints.  The patient was just discharged 3 days ago from our hospital after treatment of altered mental status, pneumonia and UTI.  At that time, CAT scan of the head was unremarkable. He was found confusion and delirium since last night.  According to his daughter, the patient did not complains any chest pain or other symptoms before delirium.  The patient had cough which is chronic.  The patient is bedbound for several months.  The patient's aspirin and Eliquis were discontinued recently PAST MEDICAL HISTORY:   Past Medical History:  Diagnosis Date  . A-fib (HCC)   . Bradycardia    06/2010  . CAD (coronary artery disease)    nuclear 01/2008, no ischemia, EF 60%, distal anterior scar  . Carotid artery disease (HCC)    doppler 05/2009,  0-39% bilateral / doppler 06/2010 OK  . Edema    Ankle edema August, 2012  . Ejection fraction    EF 45%, echo,05/2009,apical hypo,  ?? posterior and lateral hypo  . Elevated CPK    11/23/08.Marland KitchenMarland Kitchenprobably from debris from popliteal aneurysm  . Fall at home July 2015  . Hx of CABG    1998  . Hyperlipidemia   . Intracranial hemorrhage (HCC)    Two small subependymal bleeds 06/2010,,ASA held  . PAD (peripheral artery disease) (HCC)    Above the knee to below the knee popliteal bypass,, right  //    Doppler,  July, 2013, patent  .  Popliteal aneurysm (HCC)    .repair..11/25/2008  Dr. Darrick Penna  . Prominent abdominal aortic pulsation    doppler normal  . Speech problem    Neurologic event with brief infusion and speech difficulty.... January, 2011.... being assessed by neurology  February, 2011  . Syncope    .remote..no etiology  /  syncope 11/2008.. from acute leg pain  . TIA (transient ischemic attack)    Brief episode of confusion and speech difficulty, January, 2012  /  recurrent confusion speech difficulty September, 2012  . Venous insufficiency    support hose    PAST SURGICAL HISTORY:   Past Surgical History:  Procedure Laterality Date  . CORONARY ARTERY BYPASS GRAFT  1998  . popliteal bypass  with reversed greater saphenous vein from right leg.      SOCIAL HISTORY:   Social History   Tobacco Use  . Smoking status: Former Smoker    Years: 20.00    Types: Cigarettes    Last attempt to quit: 09/12/1946    Years since quitting: 71.1  . Smokeless tobacco: Never Used  . Tobacco comment: quit appox 50 yrs ago  Substance Use Topics  . Alcohol use: No    FAMILY HISTORY:   Family History  Problem Relation Age of Onset  . Stroke Mother   . Diabetes Mother   . Diabetes Sister  DRUG ALLERGIES:   Allergies  Allergen Reactions  . Penicillins Hives    Has patient had a PCN reaction causing immediate rash, facial/tongue/throat swelling, SOB or lightheadedness with hypotension: no Has patient had a PCN reaction causing severe rash involving mucus membranes or skin necrosis: no Has patient had a PCN reaction that required hospitalization no Has patient had a PCN reaction occurring within the last 10 years: no If all of the above answers are "NO", then may proceed with Cephalosporin use.   . Latex Rash  . Sulfonamide Derivatives Rash    REVIEW OF SYSTEMS:   Review of Systems  Unable to perform ROS: Mental status change    MEDICATIONS AT HOME:   Prior to Admission medications   Medication  Sig Start Date End Date Taking? Authorizing Provider  acetaminophen (TYLENOL) 325 MG tablet Take 2 tablets (650 mg total) by mouth every 6 (six) hours as needed for mild pain (or Fever >/= 101). 10/08/15  Yes Gouru, Aruna, MD  albuterol (PROVENTIL) (2.5 MG/3ML) 0.083% nebulizer solution USE 1 VIAL VIA NEBULIZER EVERY 6 HOURS AS NEEDED FOR WHEEZING OR SHORTNESS OF BREATH Patient taking differently: Take 2.5 mg by nebulization every 6 (six) hours as needed for wheezing or shortness of breath.  07/14/17  Yes Johnson, Megan P, DO  amiodarone (PACERONE) 200 MG tablet Take 1 tablet (200 mg total) by mouth daily. 10/23/17 11/22/17 Yes Altamese Dilling, MD  benzonatate (TESSALON) 100 MG capsule TAKE ONE CAPSULE BY MOUTH EVERY 8 HOURS AS NEEDED COUGH Patient taking differently: Take 100 mg by mouth 3 (three) times daily as needed for cough.  06/27/16  Yes Crissman, Redge Gainer, MD  cefdinir (OMNICEF) 300 MG capsule Take 1 capsule (300 mg total) by mouth every 12 (twelve) hours for 3 days. 10/22/17 10/16/2017 Yes Altamese Dilling, MD  docusate sodium (COLACE) 100 MG capsule Take 100 mg by mouth 2 (two) times daily as needed for mild constipation.    Yes [provider]  ferrous sulfate 325 (65 FE) MG tablet Take 1 tablet (325 mg total) by mouth 2 (two) times daily with a meal. 10/22/17  Yes Altamese Dilling, MD  fluticasone (FLONASE) 50 MCG/ACT nasal spray Place 1 spray into both nostrils daily. Patient taking differently: Place 1 spray into both nostrils daily as needed for allergies.  08/22/15  Yes Auburn Bilberry, MD  levETIRAcetam (KEPPRA) 500 MG tablet Take 1.5 tabs twice a day Patient taking differently: Take 1,500 mg by mouth 2 (two) times daily.  09/03/17  Yes Van Clines, MD  Multiple Vitamin (MULTIVITAMIN WITH MINERALS) TABS tablet Take 1 tablet by mouth daily with breakfast.    Yes [provider]  polyethylene glycol (MIRALAX / GLYCOLAX) packet Take 17 g by mouth daily as needed  for mild constipation.    Yes [provider]  Vitamin D, Cholecalciferol, 1000 units TABS Take 2,000 tablets by mouth daily.    Yes [provider]  albuterol (PROVENTIL HFA;VENTOLIN HFA) 108 (90 Base) MCG/ACT inhaler Inhale 2 puffs every 6 (six) hours as needed into the lungs for wheezing or shortness of breath. Patient not taking: Reported on 10/19/2017 01/07/17   Olevia Perches P, DO      VITAL SIGNS:  Blood pressure 134/74, pulse 74, temperature (!) 97.3 F (36.3 C), temperature source Axillary, resp. rate 20, height 5\' 11"  (1.803 m), weight 83.5 kg, SpO2 96 %.  PHYSICAL EXAMINATION:  Physical Exam  GENERAL:  84 y.o.-year-old patient lying in the bed with no  acute distress.  EYES: Pupils equal, round, reactive to light and accommodation. No scleral icterus. Extraocular muscles intact.  HEENT: Head atraumatic, normocephalic.  NECK:  Supple, no jugular venous distention. No thyroid enlargement, no tenderness.  LUNGS: Normal breath sounds bilaterally, no wheezing, rales,rhonchi or crepitation. No use of accessory muscles of respiration.  CARDIOVASCULAR: S1, S2 normal. No murmurs, rubs, or gallops.  ABDOMEN: Soft, nontender, nondistended. Bowel sounds present. No organomegaly or mass.  EXTREMITIES: No pedal edema, cyanosis, or clubbing.  NEUROLOGIC: Unable to exam. PSYCHIATRIC: The patient is confused.  Noncommunicative at this time. SKIN: No obvious rash, lesion, or ulcer.   LABORATORY PANEL:   CBC Recent Labs  Lab 10/28/2017 1221  WBC 6.5  HGB 8.1*  HCT 25.4*  PLT 226   ------------------------------------------------------------------------------------------------------------------  Chemistries  Recent Labs  Lab 10/04/2017 1221  NA 128*  K 4.6  CL 90*  CO2 31  GLUCOSE 105*  BUN 10  CREATININE <0.30*  CALCIUM 8.3*  AST 27  ALT 14  ALKPHOS 71  BILITOT 0.7    ------------------------------------------------------------------------------------------------------------------  Cardiac Enzymes Recent Labs  Lab 10/30/2017 1221  TROPONINI 0.83*   ------------------------------------------------------------------------------------------------------------------  RADIOLOGY:  Dg Chest Port 1 View  Result Date: 10/03/2017 CLINICAL DATA:  Altered mental status and cough. EXAM: PORTABLE CHEST 1 VIEW COMPARISON:  10/19/2017 FINDINGS: Again noted are low lung volumes. Coarse lung markings appear chronic but there are increased vascular markings and densities in the central left lung and left lung base. Heart size is stable with median sternotomy wires. Atherosclerotic calcifications at the aortic arch. IMPRESSION: Low lung volumes and cannot exclude asymmetric vascular congestion or mild edema. Electronically Signed   By: Richarda OverlieAdam  Henn M.D.   On: 10/29/2017 12:48      IMPRESSION AND PLAN:   Non-STEMI. The patient will be admitted to medical floor. After discussion with the patient daughter, she agrees to start heparin drip and aspirin.  Follow-up troponin level and cardiology consult.  Acute metabolic encephalopathy, unclear etiology.  Chest x-ray and urinalysis are unremarkable CT head 5 days ago was unremarkable.  Follow-up MRI of the brain.  Neurology consult PRN.  Aspiration fall precaution.  History of A. fib.  Heart rate is controlled.  Anemia of chronic disease.  Hemoglobin is stable. No active bleeding.  Chronic hyponatremia.  Stable.  CAD and PAD. Poor prognosis, palliative care consult. All the records are reviewed and case discussed with ED provider. Management plans discussed with the patient's daughter and other family members and they are in agreement.  CODE STATUS: DNR.  TOTAL TIME TAKING CARE OF THIS PATIENT: 43 minutes.    Shaune PollackQing Santiago Stenzel M.D on 10/17/2017 at 2:55 PM  Between 7am to 6pm - Pager - [phone]  After 6pm go to  [email or web address] - Social research officer, [email or web address] [email or web address]  [facility] [place] [facility]  Office  [phone]  CC: Primary care physician; Steele Sizerrissman, Mark A, MD   Note: This dictation was prepared with Dragon dictation along with smaller phrase technology. Any transcriptional errors that result from this process are unin

## 2017-10-25 NOTE — Progress Notes (Signed)
Lab called with a critical value of ptt > 160 ED did not draw a pre heparin level.  RN held heparin.  RN text paged Dr. Imogene Burnhen.  He said to leave the heparin stopped and notify the pharmacy, so they could order another level.   Pharmacist Misty Stanley(Lisa) said she would do so.  Christean GriefShylon M Lota Leamer, RN

## 2017-10-26 ENCOUNTER — Inpatient Hospital Stay: Payer: Medicare Other

## 2017-10-26 DIAGNOSIS — G9340 Encephalopathy, unspecified: Secondary | ICD-10-CM

## 2017-10-26 LAB — BASIC METABOLIC PANEL
ANION GAP: 6 (ref 5–15)
BUN: 9 mg/dL (ref 8–23)
CALCIUM: 8.3 mg/dL — AB (ref 8.9–10.3)
CO2: 30 mmol/L (ref 22–32)
Chloride: 91 mmol/L — ABNORMAL LOW (ref 98–111)
Creatinine, Ser: <0.3 mg/dL — ABNORMAL LOW (ref 0.61–1.24)
Glucose, Bld: 85 mg/dL (ref 70–99)
Potassium: 4.1 mmol/L (ref 3.5–5.1)
SODIUM: 127 mmol/L — AB (ref 135–145)

## 2017-10-26 LAB — CBC
HCT: 24.2 % — ABNORMAL LOW (ref 40.0–52.0)
HEMOGLOBIN: 7.8 g/dL — AB (ref 13.0–18.0)
MCH: 23.2 pg — ABNORMAL LOW (ref 26.0–34.0)
MCHC: 32 g/dL (ref 32.0–36.0)
MCV: 72.4 fL — ABNORMAL LOW (ref 80.0–100.0)
Platelets: 194 10*3/uL (ref 150–440)
RBC: 3.35 MIL/uL — AB (ref 4.40–5.90)
RDW: 20.2 % — ABNORMAL HIGH (ref 11.5–14.5)
WBC: 5 10*3/uL (ref 3.8–10.6)

## 2017-10-26 LAB — HEPARIN LEVEL (UNFRACTIONATED): HEPARIN UNFRACTIONATED: 0.58 [IU]/mL (ref 0.30–0.70)

## 2017-10-26 LAB — AMMONIA: Ammonia: <9 umol/L — ABNORMAL LOW (ref 9–35)

## 2017-10-26 MED ORDER — APIXABAN 2.5 MG PO TABS
2.5000 mg | ORAL_TABLET | Freq: Two times a day (BID) | ORAL | Status: DC
  Filled 2017-10-26: qty 1

## 2017-10-26 MED ORDER — LEVETIRACETAM 500 MG PO TABS: 500.0000 mg | ORAL_TABLET | Freq: Two times a day (BID) | ORAL | Status: DC

## 2017-10-26 NOTE — Progress Notes (Addendum)
[facility] - [place] [facility]   PATIENT NAME: [name] [name]    MR#:  [id number]  DATE OF BIRTH:  09/14/1921  SUBJECTIVE:  CHIEF COMPLAINT:   Chief Complaint  Patient presents with  . Altered Mental Status  Patient seen and evaluated today Confused Not completely oriented to time place and person Moves all extremities  REVIEW OF SYSTEMS:    ROS Could not be obtained secondary to confusion  DRUG ALLERGIES:   Allergies  Allergen Reactions  . Penicillins Hives    Has patient had a PCN reaction causing immediate rash, facial/tongue/throat swelling, SOB or lightheadedness with hypotension: no Has patient had a PCN reaction causing severe rash involving mucus membranes or skin necrosis: no Has patient had a PCN reaction that required hospitalization no Has patient had a PCN reaction occurring within the last 10 years: no If all of the above answers are "NO", then may proceed with Cephalosporin use.   . Latex Rash  . Sulfonamide Derivatives Rash    VITALS:  Blood pressure 126/67, pulse 66, temperature (!) 97.5 F (36.4 C), temperature source Oral, resp. rate 18, height 5\' 11"  (1.803 m), weight 83.5 kg, SpO2 91 %.  PHYSICAL EXAMINATION:   Physical Exam  GENERAL:  84 y.o.-year-old patient lying in the bed  EYES: Pupils equal, round, reactive to light and accommodation. No scleral icterus. Extraocular muscles intact.  HEENT: Head atraumatic, normocephalic. Oropharynx and nasopharynx clear.  NECK:  Supple, no jugular venous distention. No thyroid enlargement, no tenderness.  LUNGS: Normal breath sounds bilaterally, no wheezing, rales, rhonchi. No use of accessory muscles of respiration.  CARDIOVASCULAR: S1, S2 normal. No murmurs, rubs, or gallops.  ABDOMEN: Soft, nontender, nondistended. Bowel sounds present. No organomegaly or mass.  EXTREMITIES: No cyanosis, clubbing or edema b/l.    NEUROLOGIC: Not oriented to time, place and person Moves all  extremities PSYCHIATRIC: could not be assessed SKIN: No obvious rash, lesion, or ulcer.   LABORATORY PANEL:   CBC Recent Labs  Lab 10/26/17 0417  WBC 5.0  HGB 7.8*  HCT 24.2*  PLT 194   ------------------------------------------------------------------------------------------------------------------ Chemistries  Recent Labs  Lab 2017/07/24 1221 10/26/17 0417  NA 128* 127*  K 4.6 4.1  CL 90* 91*  CO2 31 30  GLUCOSE 105* 85  BUN 10 9  CREATININE <0.30* <0.30*  CALCIUM 8.3* 8.3*  AST 27  --   ALT 14  --   ALKPHOS 71  --   BILITOT 0.7  --    ------------------------------------------------------------------------------------------------------------------  Cardiac Enzymes Recent Labs  Lab 2017/07/24 2319  TROPONINI 0.97*   ------------------------------------------------------------------------------------------------------------------  RADIOLOGY:  Dg Chest Port 1 View  Result Date: Jul 23, 2017 CLINICAL DATA:  Altered mental status and cough. EXAM: PORTABLE CHEST 1 VIEW COMPARISON:  10/19/2017 FINDINGS: Again noted are low lung volumes. Coarse lung markings appear chronic but there are increased vascular markings and densities in the central left lung and left lung base. Heart size is stable with median sternotomy wires. Atherosclerotic calcifications at the aortic arch. IMPRESSION: Low lung volumes and cannot exclude asymmetric vascular congestion or mild edema. Electronically Signed   By: Richarda OverlieAdam  Henn M.D.   On: 0May 22, 2019 12:48     ASSESSMENT AND PLAN:   84 year old elderly male patient with history of chronic atrial fibrillation, coronary artery disease, CABG, carotid artery disease, hypertension, hyperlipidemia, peripheral arterial disease currently under hospitalist service for elevated troponin and altered mental status  -Acute metabolic encephalopathy Check MRI brain Neurology consultation Urinalysis did not show  any infection Check ammonia  level  -Non-STEMI Patient seen by cardiology Family does not want any aggressive intervention such as cardiac cath Medical management Heparin drip discontinued and oral Eliquis started Continue cardiac meds  -Chronic anemia Monitor hemoglobin hematocrit  -Chronic hyponatremia Monitor sodium level  -Coronary artery disease Medical management  -Palliative care consult  All the records are reviewed and case discussed with Care Management/Social Worker. Management plans discussed with the patient, family and they are in agreement.  CODE STATUS: DNR  DVT Prophylaxis: SCDs  TOTAL TIME TAKING CARE OF THIS PATIENT: 35 minutes.   POSSIBLE D/C IN 2 to 3 DAYS, DEPENDING ON CLINICAL CONDITION.  Ihor Austin M.D on 10/26/2017 at 9:48 AM  Between 7am to 6pm - Pager - [phone]  After 6pm go to [email or web address] - password [email or web address]  [facility] [place] [facility]  Office  [phone]  CC: Primary care physician; Steele Sizer, MD  Note: This dictation was prepared with Dragon dictation along with smaller phrase technology. Any transcriptional errors that result from this process are unintentional.

## 2017-10-26 NOTE — Consult Note (Signed)
[name] [name] is a 84 y.o. male  [id number]  Primary Cardiologist: Adrian Blackwater Reason for Consultation: non-STEMI  HPI: this is a 84 year old white male with a past medical history of GI bleed was on ellequis as well as aspirin and ellequis was stopped along with aspirin which was last Tuesday. Patient became very restless confused and was brought to the hospital because of pulse ox was showing saturation of 70. In the hospital patient had elevated troponin of 0.8 and I was asked to evaluate the patient.   Review of Systems: \O chest pain   Past Medical History:  Diagnosis Date  . A-fib (HCC)   . Bradycardia    06/2010  . CAD (coronary artery disease)    nuclear 01/2008, no ischemia, EF 60%, distal anterior scar  . Carotid artery disease (HCC)    doppler 05/2009,  0-39% bilateral / doppler 06/2010 OK  . Edema    Ankle edema August, 2012  . Ejection fraction    EF 45%, echo,05/2009,apical hypo,  ?? posterior and lateral hypo  . Elevated CPK    11/23/08.Marland KitchenMarland Kitchenprobably from debris from popliteal aneurysm  . Fall at home July 2015  . Hx of CABG    1998  . Hyperlipidemia   . Intracranial hemorrhage (HCC)    Two small subependymal bleeds 06/2010,,ASA held  . PAD (peripheral artery disease) (HCC)    Above the knee to below the knee popliteal bypass,, right  //    Doppler,  July, 2013, patent  . Popliteal aneurysm (HCC)    .repair..11/25/2008  Dr. Darrick Penna  . Prominent abdominal aortic pulsation    doppler normal  . Speech problem    Neurologic event with brief infusion and speech difficulty.... January, 2011.... being assessed by neurology  February, 2011  . Syncope    .remote..no etiology  /  syncope 11/2008.. from acute leg pain  . TIA (transient ischemic attack)    Brief episode of confusion and speech difficulty, January, 2012  /  recurrent confusion speech difficulty September, 2012  . Venous insufficiency    support hose    Medications Prior to Admission  Medication Sig  Dispense Refill  . acetaminophen (TYLENOL) 325 MG tablet Take 2 tablets (650 mg total) by mouth every 6 (six) hours as needed for mild pain (or Fever >/= 101).    Marland Kitchen albuterol (PROVENTIL) (2.5 MG/3ML) 0.083% nebulizer solution USE 1 VIAL VIA NEBULIZER EVERY 6 HOURS AS NEEDED FOR WHEEZING OR SHORTNESS OF BREATH (Patient taking differently: Take 2.5 mg by nebulization every 6 (six) hours as needed for wheezing or shortness of breath. ) 150 mL 6  . amiodarone (PACERONE) 200 MG tablet Take 1 tablet (200 mg total) by mouth daily. 30 tablet 0  . benzonatate (TESSALON) 100 MG capsule TAKE ONE CAPSULE BY MOUTH EVERY 8 HOURS AS NEEDED COUGH (Patient taking differently: Take 100 mg by mouth 3 (three) times daily as needed for cough. ) 90 capsule 0  . [EXPIRED] cefdinir (OMNICEF) 300 MG capsule Take 1 capsule (300 mg total) by mouth every 12 (twelve) hours for 3 days. 6 capsule 0  . docusate sodium (COLACE) 100 MG capsule Take 100 mg by mouth 2 (two) times daily as needed for mild constipation.     . ferrous sulfate 325 (65 FE) MG tablet Take 1 tablet (325 mg total) by mouth 2 (two) times daily with a meal. 60 tablet 0  . fluticasone (FLONASE) 50 MCG/ACT nasal spray Place 1 spray into both  nostrils daily. (Patient taking differently: Place 1 spray into both nostrils daily as needed for allergies. )  2  . levETIRAcetam (KEPPRA) 500 MG tablet Take 1.5 tabs twice a day (Patient taking differently: Take 1,500 mg by mouth 2 (two) times daily. ) 270 tablet 3  . Multiple Vitamin (MULTIVITAMIN WITH MINERALS) TABS tablet Take 1 tablet by mouth daily with breakfast.     . polyethylene glycol (MIRALAX / GLYCOLAX) packet Take 17 g by mouth daily as needed for mild constipation.     . Vitamin D, Cholecalciferol, 1000 units TABS Take 2,000 tablets by mouth daily.     Marland Kitchen albuterol (PROVENTIL HFA;VENTOLIN HFA) 108 (90 Base) MCG/ACT inhaler Inhale 2 puffs every 6 (six) hours as needed into the lungs for wheezing or shortness of  breath. (Patient not taking: Reported on 10/19/2017) 1 Inhaler 2     . amiodarone  200 mg Oral Daily  . aspirin EC  325 mg Oral Daily  . ferrous sulfate  325 mg Oral BID WC  . levETIRAcetam  1,500 mg Oral BID  . sodium chloride flush  3 mL Intravenous Q12H    Infusions: . sodium chloride    . heparin Stopped (10/26/17 0913)    Allergies  Allergen Reactions  . Penicillins Hives    Has patient had a PCN reaction causing immediate rash, facial/tongue/throat swelling, SOB or lightheadedness with hypotension: no Has patient had a PCN reaction causing severe rash involving mucus membranes or skin necrosis: no Has patient had a PCN reaction that required hospitalization no Has patient had a PCN reaction occurring within the last 10 years: no If all of the above answers are "NO", then may proceed with Cephalosporin use.   . Latex Rash  . Sulfonamide Derivatives Rash    Social History   Socioeconomic History  . Marital status: Married    Spouse name: Not on file  . Number of children: Not on file  . Years of education: Not on file  . Highest education level: Not on file  Occupational History  . Occupation: retired  Engineer, production  . Financial resource strain: Patient refused  . Food insecurity:    Worry: Patient refused    Inability: Patient refused  . Transportation needs:    Medical: Patient refused    Non-medical: Patient refused  Tobacco Use  . Smoking status: Former Smoker    Years: 20.00    Types: Cigarettes    Last attempt to quit: 09/12/1946    Years since quitting: 71.1  . Smokeless tobacco: Never Used  . Tobacco comment: quit appox 50 yrs ago  Substance and Sexual Activity  . Alcohol use: No  . Drug use: No  . Sexual activity: Not on file  Lifestyle  . Physical activity:    Days per week: Patient refused    Minutes per session: Patient refused  . Stress: Patient refused  Relationships  . Social connections:    Talks on phone: Patient refused    Gets  together: Patient refused    Attends religious service: Patient refused    Active member of club or organization: Patient refused    Attends meetings of clubs or organizations: Patient refused    Relationship status: Patient refused  . Intimate partner violence:    Fear of current or ex partner: Patient refused    Emotionally abused: Patient refused    Physically abused: Patient refused    Forced sexual activity: Patient refused  Other Topics Concern  . Not  on file  Social History Narrative  . Not on file    Family History  Problem Relation Age of Onset  . Stroke Mother   . Diabetes Mother   . Diabetes Sister     PHYSICAL EXAM: Vitals:   10/26/17 0626 10/26/17 0842  BP:  126/67  Pulse:  66  Resp:    Temp:  (!) 97.5 F (36.4 C)  SpO2: 93% 91%     Intake/Output Summary (Last 24 hours) at 10/26/2017 0926 Last data filed at 10/26/2017 0707 Gross per 24 hour  Intake 91 ml  Output 0 ml  Net 91 ml    General:  Well appearing. No respiratory difficulty HEENT: normal Neck: supple. no JVD. Carotids 2+ bilat; no bruits. No lymphadenopathy or thryomegaly appreciated. Cor: PMI nondisplaced. Regular rate & rhythm. No rubs, gallops or murmurs. Lungs: clear Abdomen: soft, nontender, nondistended. No hepatosplenomegaly. No bruits or masses. Good bowel sounds. Extremities: no cyanosis, clubbing, rash, edema Neuro: alert & oriented x 3, cranial nerves grossly intact. moves all 4 extremities w/o difficulty. Affect pleasant.  ECG: normal sinus rhythm no acute changes  Results for orders placed or performed during the hospital encounter of 10/19/2017 (from the past 24 hour(s))  CBC with Differential/Platelet     Status: Abnormal   Collection Time: 10/12/2017 12:21 PM  Result Value Ref Range   WBC 6.5 3.8 - 10.6 K/uL   RBC 3.49 (L) 4.40 - 5.90 MIL/uL   Hemoglobin 8.1 (L) 13.0 - 18.0 g/dL   HCT 52.825.4 (L) 41.340.0 - 24.452.0 %   MCV 72.8 (L) 80.0 - 100.0 fL   MCH 23.1 (L) 26.0 - 34.0 pg    MCHC 31.8 (L) 32.0 - 36.0 g/dL   RDW 01.020.2 (H) 27.211.5 - 53.614.5 %   Platelets 226 150 - 440 K/uL   Neutrophils Relative % 84 %   Neutro Abs 5.4 1.4 - 6.5 K/uL   Lymphocytes Relative 11 %   Lymphs Abs 0.7 (L) 1.0 - 3.6 K/uL   Monocytes Relative 4 %   Monocytes Absolute 0.3 0.2 - 1.0 K/uL   Eosinophils Relative 1 %   Eosinophils Absolute 0.1 0 - 0.7 K/uL   Basophils Relative 0 %   Basophils Absolute 0.0 0 - 0.1 K/uL  Comprehensive metabolic panel     Status: Abnormal   Collection Time: 10/15/2017 12:21 PM  Result Value Ref Range   Sodium 128 (L) 135 - 145 mmol/L   Potassium 4.6 3.5 - 5.1 mmol/L   Chloride 90 (L) 98 - 111 mmol/L   CO2 31 22 - 32 mmol/L   Glucose, Bld 105 (H) 70 - 99 mg/dL   BUN 10 8 - 23 mg/dL   Creatinine, Ser <6.44<0.30 (L) 0.61 - 1.24 mg/dL   Calcium 8.3 (L) 8.9 - 10.3 mg/dL   Total Protein 6.0 (L) 6.5 - 8.1 g/dL   Albumin 2.7 (L) 3.5 - 5.0 g/dL   AST 27 15 - 41 U/L   ALT 14 0 - 44 U/L   Alkaline Phosphatase 71 38 - 126 U/L   Total Bilirubin 0.7 0.3 - 1.2 mg/dL   GFR calc non Af Amer NOT CALCULATED >60 mL/min   GFR calc Af Amer NOT CALCULATED >60 mL/min   Anion gap 7 5 - 15  Troponin I     Status: Abnormal   Collection Time: 10/30/2017 12:21 PM  Result Value Ref Range   Troponin I 0.83 (HH) <0.03 ng/mL  Urinalysis, Complete w Microscopic  Status: Abnormal   Collection Time: 10/18/2017  1:08 PM  Result Value Ref Range   Color, Urine YELLOW (A) YELLOW   APPearance CLOUDY (A) CLEAR   Specific Gravity, Urine 1.012 1.005 - 1.030   pH 8.0 5.0 - 8.0   Glucose, UA NEGATIVE NEGATIVE mg/dL   Hgb urine dipstick NEGATIVE NEGATIVE   Bilirubin Urine NEGATIVE NEGATIVE   Ketones, ur NEGATIVE NEGATIVE mg/dL   Protein, ur NEGATIVE NEGATIVE mg/dL   Nitrite NEGATIVE NEGATIVE   Leukocytes, UA NEGATIVE NEGATIVE   RBC / HPF 6-10 0 - 5 RBC/hpf   WBC, UA 0-5 0 - 5 WBC/hpf   Bacteria, UA NONE SEEN NONE SEEN   Squamous Epithelial / LPF NONE SEEN 0 - 5   Mucus PRESENT    Amorphous  Crystal PRESENT   APTT     Status: Abnormal   Collection Time: 10/13/2017  5:03 PM  Result Value Ref Range   aPTT >160 (HH) 24 - 36 seconds  Protime-INR     Status: None   Collection Time: 10/07/2017  5:03 PM  Result Value Ref Range   Prothrombin Time 15.2 11.4 - 15.2 seconds   INR 1.21   Troponin I     Status: Abnormal   Collection Time: 10/28/2017  6:14 PM  Result Value Ref Range   Troponin I 0.88 (HH) <0.03 ng/mL  Heparin level (unfractionated)     Status: None   Collection Time: 10/10/2017  7:41 PM  Result Value Ref Range   Heparin Unfractionated 0.57 0.30 - 0.70 IU/mL  APTT     Status: Abnormal   Collection Time: 10/23/2017  7:41 PM  Result Value Ref Range   aPTT 60 (H) 24 - 36 seconds  Troponin I     Status: Abnormal   Collection Time: 10/26/2017 11:19 PM  Result Value Ref Range   Troponin I 0.97 (HH) <0.03 ng/mL  Basic metabolic panel     Status: Abnormal   Collection Time: 10/26/17  4:17 AM  Result Value Ref Range   Sodium 127 (L) 135 - 145 mmol/L   Potassium 4.1 3.5 - 5.1 mmol/L   Chloride 91 (L) 98 - 111 mmol/L   CO2 30 22 - 32 mmol/L   Glucose, Bld 85 70 - 99 mg/dL   BUN 9 8 - 23 mg/dL   Creatinine, Ser <1.61 (L) 0.61 - 1.24 mg/dL   Calcium 8.3 (L) 8.9 - 10.3 mg/dL   GFR calc non Af Amer NOT CALCULATED >60 mL/min   GFR calc Af Amer NOT CALCULATED >60 mL/min   Anion gap 6 5 - 15  CBC     Status: Abnormal   Collection Time: 10/26/17  4:17 AM  Result Value Ref Range   WBC 5.0 3.8 - 10.6 K/uL   RBC 3.35 (L) 4.40 - 5.90 MIL/uL   Hemoglobin 7.8 (L) 13.0 - 18.0 g/dL   HCT 09.6 (L) 04.5 - 40.9 %   MCV 72.4 (L) 80.0 - 100.0 fL   MCH 23.2 (L) 26.0 - 34.0 pg   MCHC 32.0 32.0 - 36.0 g/dL   RDW 81.1 (H) 91.4 - 78.2 %   Platelets 194 150 - 440 K/uL  Heparin level (unfractionated)     Status: None   Collection Time: 10/26/17  4:17 AM  Result Value Ref Range   Heparin Unfractionated 0.58 0.30 - 0.70 IU/mL   Dg Chest Port 1 View  Result Date: 10/08/2017 CLINICAL DATA:   Altered mental status and cough. EXAM: PORTABLE  CHEST 1 VIEW COMPARISON:  10/19/2017 FINDINGS: Again noted are low lung volumes. Coarse lung markings appear chronic but there are increased vascular markings and densities in the central left lung and left lung base. Heart size is stable with median sternotomy wires. Atherosclerotic calcifications at the aortic arch. IMPRESSION: Low lung volumes and cannot exclude asymmetric vascular congestion or mild edema. Electronically Signed   By: Richarda Overlie M.D.   On: Nov 23, 2017 12:48     ASSESSMENT AND PLAN: mildly elevated troponin of 0.97 with hemoglobin 7.8 and possible GI bleed. Discussed with the daughters about risks versus benefits of*restarting ellequisbut not aspirin and start isosorbide dinitrate 30 mg once a day. The family does not want to have catheterization and the family agreed to start ellequis. Rik Wadel A

## 2017-10-26 NOTE — Consult Note (Signed)
Reason for Consult: confusion  Referring Physician: Dr. Tobi BastosPyreddy   CC: confusion   HPI: [name] [name] is an 84 y.o. male seen by me on 8/19 for similar presentation of confusion. At that time had UTI and PNA.  He has history of A. fib, CAD, status CABG, carotid artery disease, hypertension, hyperlipidemia, TIA and PAD.  Presents with worsening confusion.  The patient was just discharged 3 days ago from our hospital after treatment of altered mental status, pneumonia and UTI. As per family decreased sleep, agitation and now confusion.    Past Medical History:  Diagnosis Date  . A-fib (HCC)   . Bradycardia    06/2010  . CAD (coronary artery disease)    nuclear 01/2008, no ischemia, EF 60%, distal anterior scar  . Carotid artery disease (HCC)    doppler 05/2009,  0-39% bilateral / doppler 06/2010 OK  . Edema    Ankle edema August, 2012  . Ejection fraction    EF 45%, echo,05/2009,apical hypo,  ?? posterior and lateral hypo  . Elevated CPK    11/23/08.Marland Kitchen.Marland Kitchen.probably from debris from popliteal aneurysm  . Fall at home July 2015  . Hx of CABG    1998  . Hyperlipidemia   . Intracranial hemorrhage (HCC)    Two small subependymal bleeds 06/2010,,ASA held  . PAD (peripheral artery disease) (HCC)    Above the knee to below the knee popliteal bypass,, right  //    Doppler,  July, 2013, patent  . Popliteal aneurysm (HCC)    .repair..11/25/2008  Dr. Darrick PennaFields  . Prominent abdominal aortic pulsation    doppler normal  . Speech problem    Neurologic event with brief infusion and speech difficulty.... January, 2011.... being assessed by neurology  February, 2011  . Syncope    .remote..no etiology  /  syncope 11/2008.. from acute leg pain  . TIA (transient ischemic attack)    Brief episode of confusion and speech difficulty, January, 2012  /  recurrent confusion speech difficulty September, 2012  . Venous insufficiency    support hose    Past Surgical History:  Procedure Laterality Date  . CORONARY  ARTERY BYPASS GRAFT  1998  . popliteal bypass  with reversed greater saphenous vein from right leg.      Family History  Problem Relation Age of Onset  . Stroke Mother   . Diabetes Mother   . Diabetes Sister     Social History:  reports that he quit smoking about 71 years ago. His smoking use included cigarettes. He quit after 20.00 years of use. He has never used smokeless tobacco. He reports that he does not drink alcohol or use drugs.  Allergies  Allergen Reactions  . Penicillins Hives    Has patient had a PCN reaction causing immediate rash, facial/tongue/throat swelling, SOB or lightheadedness with hypotension: no Has patient had a PCN reaction causing severe rash involving mucus membranes or skin necrosis: no Has patient had a PCN reaction that required hospitalization no Has patient had a PCN reaction occurring within the last 10 years: no If all of the above answers are "NO", then may proceed with Cephalosporin use.   . Latex Rash  . Sulfonamide Derivatives Rash    Medications: I have reviewed the patient's current medications.  ROS: Unable to obtain due to confusion   Physical Examination: Blood pressure 126/67, pulse 66, temperature (!) 97.5 F (36.4 C), temperature source Oral, resp. rate 18, height 5\' 11"  (1.803 m), weight 83.5 kg, SpO2  91 %.  Unable to do full neurological exam in setting of somnolence Opens eyes to sternal rub and moves extremities symmetrically Non verbal at this time.    Laboratory Studies:   Basic Metabolic Panel: Recent Labs  Lab 10/20/17 0441 10/21/17 0429 10/22/17 0415 10/26/2017 1221 10/26/17 0417  NA 130* 130* 131* 128* 127*  K 4.6 3.9 4.2 4.6 4.1  CL 94* 98 97* 90* 91*  CO2 30 29 30 31 30   GLUCOSE 91 80 98 105* 85  BUN 12 8 11 10 9   CREATININE <0.30* <0.30* <0.30* <0.30* <0.30*  CALCIUM 8.4* 7.9* 7.9* 8.3* 8.3*    Liver Function Tests: Recent Labs  Lab 10/19/17 1915 10/13/2017 1221  AST 20 27  ALT 11 14  ALKPHOS 71  71  BILITOT 0.4 0.7  PROT 5.8* 6.0*  ALBUMIN 2.6* 2.7*   No results for input(s): LIPASE, AMYLASE in the last 168 hours. No results for input(s): AMMONIA in the last 168 hours.  CBC: Recent Labs  Lab 10/19/17 1915 10/20/17 0441 10/21/17 0429 10/22/17 0415 10/29/2017 1221 10/26/17 0417  WBC 4.6 5.4 4.0 5.4 6.5 5.0  NEUTROABS 3.4  --   --   --  5.4  --   HGB 7.8* 7.6* 7.0* 7.3* 8.1* 7.8*  HCT 24.3* 23.4* 21.9* 22.7* 25.4* 24.2*  MCV 71.5* 74.3* 73.6* 71.3* 72.8* 72.4*  PLT 221 202 182 191 226 194    Cardiac Enzymes: Recent Labs  Lab 10/19/17 1915 10/11/2017 1221 10/18/2017 1814 10/21/2017 2319  TROPONINI <0.03 0.83* 0.88* 0.97*    BNP: Invalid input(s): POCBNP  CBG: Recent Labs  Lab 10/21/17 0804  GLUCAP 80    Microbiology: Results for orders placed or performed during the hospital encounter of 10/19/17  Culture, blood (routine x 2)     Status: None   Collection Time: 10/19/17  8:34 PM  Result Value Ref Range Status   Specimen Description BLOOD LEFT ANTECUBITAL  Final   Special Requests   Final    BOTTLES DRAWN AEROBIC AND ANAEROBIC Blood Culture adequate volume   Culture   Final    NO GROWTH 5 DAYS Performed at [facility], [address] [place], [place] [address]    Report Status 10/24/2017 FINAL  Final  Urine Culture     Status: Abnormal   Collection Time: 10/19/17  8:34 PM  Result Value Ref Range Status   Specimen Description   Final    URINE, RANDOM Performed at [facility], [address], [place], [place] [address]    Special Requests   Final    NONE Performed at [facility], [address] [place], [place] [address]    Culture >=100,000 COLONIES/mL STAPHYLOCOCCUS AUREUS (A)  Final   Report Status 10/22/2017 FINAL  Final   Organism ID, Bacteria STAPHYLOCOCCUS AUREUS (A)  Final      Susceptibility   Staphylococcus aureus - MIC*    CIPROFLOXACIN <=0.5 SENSITIVE Sensitive     GENTAMICIN <=0.5 SENSITIVE Sensitive      NITROFURANTOIN <=16 SENSITIVE Sensitive     OXACILLIN <=0.25 SENSITIVE Sensitive     TETRACYCLINE <=1 SENSITIVE Sensitive     VANCOMYCIN 1 SENSITIVE Sensitive     TRIMETH/SULFA <=10 SENSITIVE Sensitive     CLINDAMYCIN RESISTANT Resistant     RIFAMPIN <=0.5 SENSITIVE Sensitive     Inducible Clindamycin POSITIVE Resistant     * >=100,000 COLONIES/mL STAPHYLOCOCCUS AUREUS  Culture, blood (routine x 2)     Status: None  Collection Time: 10/19/17  8:56 PM  Result Value Ref Range Status   Specimen Description BLOOD LEFT WRIST  Final   Special Requests   Final    BOTTLES DRAWN AEROBIC AND ANAEROBIC Blood Culture results may not be optimal due to an excessive volume of blood received in culture bottles   Culture   Final    NO GROWTH 5 DAYS Performed at [facility], [address] [place], [place] [address]    Report Status 10/24/2017 FINAL  Final    Coagulation Studies: Recent Labs    10/22/2017 1703  LABPROT 15.2  INR 1.21    Urinalysis:  Recent Labs  Lab 10/19/17 2034 10/26/2017 1308  COLORURINE YELLOW* YELLOW*  LABSPEC 1.011 1.012  PHURINE 8.0 8.0  GLUCOSEU NEGATIVE NEGATIVE  HGBUR NEGATIVE NEGATIVE  BILIRUBINUR NEGATIVE NEGATIVE  KETONESUR NEGATIVE NEGATIVE  PROTEINUR NEGATIVE NEGATIVE  NITRITE NEGATIVE NEGATIVE  LEUKOCYTESUR LARGE* NEGATIVE    Lipid Panel:     Component Value Date/Time   CHOL 142 08/20/2016 0455   CHOL 128 12/20/2015 1455   TRIG 48 08/20/2016 0455   HDL 48 08/20/2016 0455   HDL 41 12/20/2015 1455   CHOLHDL 3.0 08/20/2016 0455   VLDL 10 08/20/2016 0455   LDLCALC 84 08/20/2016 0455   LDLCALC 65 12/20/2015 1455    HgbA1C:  Lab Results  Component Value Date   HGBA1C 5.3 08/17/2015    Urine Drug Screen:  No results found for: LABOPIA, COCAINSCRNUR, LABBENZ, AMPHETMU, THCU, LABBARB  Alcohol Level:  Recent Labs  Lab 10/19/17 1915  ETH <10    Other results: EKG: a- fib rate controlled. .  Imaging: Mr Brain Wo  Contrast  Result Date: 10/26/2017 CLINICAL DATA:  Altered mental status. EXAM: MRI HEAD WITHOUT CONTRAST TECHNIQUE: Multiplanar, multiecho pulse sequences of the brain and surrounding structures were obtained without intravenous contrast. COMPARISON:  CT 10/19/2017.  MRI 06/17/2017.  CT 06/17/2017. FINDINGS: Brain: Motion degraded exam. Diffusion imaging does not show any acute or subacute infarction. Complete other sequences were not achieved because of the patient's uncertain clinical condition. Chronic small-vessel ischemic changes are seen affecting pons and the cerebral hemispheric white matter. There is chronic brain atrophy and ventriculomegaly. Ventricular size appears unchanged from the previous study. This is presumed represent ex vacuo enlargement. Normal pressure hydrocephalus not excluded. Vascular: Major vessels at the base of the brain show flow. Skull and upper cervical spine: No abnormality seen. Sinuses/Orbits: No abnormality seen. Other: None IMPRESSION: Motion degraded, abbreviated exam because of uncertainty of the patient's clinical condition. Diffusion imaging does not show any acute or subacute infarction. There is generalized atrophy and chronic small-vessel change affecting the brain. Chronic ventriculomegaly, favored to represent ex vacuo enlargement. Electronically Signed   By: Paulina Fusi M.D.   On: 10/26/2017 10:07   Dg Chest Port 1 View  Result Date: 10/17/2017 CLINICAL DATA:  Altered mental status and cough. EXAM: PORTABLE CHEST 1 VIEW COMPARISON:  10/19/2017 FINDINGS: Again noted are low lung volumes. Coarse lung markings appear chronic but there are increased vascular markings and densities in the central left lung and left lung base. Heart size is stable with median sternotomy wires. Atherosclerotic calcifications at the aortic arch. IMPRESSION: Low lung volumes and cannot exclude asymmetric vascular congestion or mild edema. Electronically Signed   By: Richarda Overlie M.D.   On:  10/15/2017 12:48     Assessment/Plan:   84 y.o. male seen by me on 8/19 for similar presentation of confusion. At that  time had UTI and PNA.  He has history of A. fib, CAD, status CABG, carotid artery disease, hypertension, hyperlipidemia, TIA and PAD.  Presents with worsening confusion.  The patient was just discharged 3 days ago from our hospital after treatment of altered mental status, pneumonia and UTI. As per family decreased sleep, agitation and now confusion.    - This is likely delirium in setting of agitatino  - he was on Keppra 750 BID which was increase recently from 500 BID for suspected but no clear seizures - Last night was given keppra 1500 x 1 - Will decrease keppra to 500 BID as no clear hx of recent seizures and it can cause worsening somnolence - MRI no clear abnormalities - Melatonin at night - Hyponatremia as per primary team - Pt is restarted on Eliquis at 2.5 BID 10/26/2017, 11:10 AM

## 2017-10-26 NOTE — Progress Notes (Signed)
SATURATION QUALIFICATIONS: (This note is used to comply with regulatory documentation for home oxygen)  Patient Saturations on Room Air at Rest = 81%  100% on 2L

## 2017-10-26 NOTE — Consult Note (Signed)
ANTICOAGULATION CONSULT NOTE - Initial Consult  Pharmacy Consult for Heparin Indication: chest pain/ACS  Allergies  Allergen Reactions  . Penicillins Hives    Has patient had a PCN reaction causing immediate rash, facial/tongue/throat swelling, SOB or lightheadedness with hypotension: no Has patient had a PCN reaction causing severe rash involving mucus membranes or skin necrosis: no Has patient had a PCN reaction that required hospitalization no Has patient had a PCN reaction occurring within the last 10 years: no If all of the above answers are "NO", then may proceed with Cephalosporin use.   . Latex Rash  . Sulfonamide Derivatives Rash    Patient Measurements: Height: 5\' 11"  (180.3 cm) Weight: 184 lb (83.5 kg) IBW/kg (Calculated) : 75.3 Heparin Dosing Weight: 83.5 kg  Vital Signs: Temp: 97.9 F (36.6 C) (08/25 0345) Temp Source: Oral (08/24 1929) BP: 115/62 (08/25 0345) Pulse Rate: 72 (08/25 0345)  Labs: Recent Labs    10/19/2017 1221 10/17/2017 1703 10/20/2017 1814 10/26/2017 1941 10/05/2017 2319 10/26/17 0417  HGB 8.1*  --   --   --   --  7.8*  HCT 25.4*  --   --   --   --  24.2*  PLT 226  --   --   --   --  194  APTT  --  >160*  --  60*  --   --   LABPROT  --  15.2  --   --   --   --   INR  --  1.21  --   --   --   --   HEPARINUNFRC  --   --   --  0.57  --  0.58  CREATININE <0.30*  --   --   --   --  <0.30*  TROPONINI 0.83*  --  0.88*  --  0.97*  --     CrCl cannot be calculated (This lab value cannot be used to calculate CrCl because it is not a number: <0.30).   Medical History: Past Medical History:  Diagnosis Date  . A-fib (HCC)   . Bradycardia    06/2010  . CAD (coronary artery disease)    nuclear 01/2008, no ischemia, EF 60%, distal anterior scar  . Carotid artery disease (HCC)    doppler 05/2009,  0-39% bilateral / doppler 06/2010 OK  . Edema    Ankle edema August, 2012  . Ejection fraction    EF 45%, echo,05/2009,apical hypo,  ?? posterior and  lateral hypo  . Elevated CPK    11/23/08.Marland KitchenMarland Kitchenprobably from debris from popliteal aneurysm  . Fall at home July 2015  . Hx of CABG    1998  . Hyperlipidemia   . Intracranial hemorrhage (HCC)    Two small subependymal bleeds 06/2010,,ASA held  . PAD (peripheral artery disease) (HCC)    Above the knee to below the knee popliteal bypass,, right  //    Doppler,  July, 2013, patent  . Popliteal aneurysm (HCC)    .repair..11/25/2008  Dr. Darrick Penna  . Prominent abdominal aortic pulsation    doppler normal  . Speech problem    Neurologic event with brief infusion and speech difficulty.... January, 2011.... being assessed by neurology  February, 2011  . Syncope    .remote..no etiology  /  syncope 11/2008.. from acute leg pain  . TIA (transient ischemic attack)    Brief episode of confusion and speech difficulty, January, 2012  /  recurrent confusion speech difficulty September, 2012  . Venous insufficiency  support hose    Medications:  Scheduled:  . amiodarone  200 mg Oral Daily  . aspirin EC  325 mg Oral Daily  . ferrous sulfate  325 mg Oral BID WC  . levETIRAcetam  1,500 mg Oral BID  . sodium chloride flush  3 mL Intravenous Q12H    Assessment: 84 yo male presented to ED with chest pain. Not on PTA anticoagulants.  Heparin was infusing at 1000 units/hr, at 18:30 RN called to say that she had stopped the Heparin drip because the aPTT resulted at >160 seconds. Explained to her that this aPTT was drawn incorrectly at only 2 hours after the bolus and start of infusion and was elevated due to the effect from the bolus dose and that the drip should be continued. She was uncomfortable restarting the drip and called the MD. MD told her to hold the drip and have pharmacy recheck labs. I rechecked aPTT about 1 hour after drip was stopped and it was down to 60 seconds.  Goal of Therapy:  Heparin level 0.3-0.7 units/ml Monitor platelets by anticoagulation protocol: Yes   Plan:  Will restart Heparin  at 1000 units/hr without a bolus. Will check a Heparin level in 8 hours. Daily CBC while on Heparin drip.  8/25 AM heparin level 0.58. Continue current regimen. Recheck in 8 hours to confirm.  Pharmacy will continue to monitor.   Clovia CuffLisa Kluttz, PharmD, BCPS 10/26/2017 5:42 AM

## 2017-10-26 NOTE — Plan of Care (Signed)
  Problem: Clinical Measurements: Goal: Will remain free from infection Outcome: Progressing Goal: Cardiovascular complication will be avoided Outcome: Progressing   Problem: Safety: Goal: Ability to remain free from injury will improve Outcome: Progressing   

## 2017-10-26 NOTE — Progress Notes (Signed)
Dr. Loretha BrasilZeylikman said to hold all oral medicines until the patient wakes up.  He explained to the family that it can cause drowsiness.  Family stated patient was on 750 daily BID at home, [facility]  Dr. Herma CarsonZ lowered it to 500 BID.  Christean GriefShylon M Leiyah Maultsby, RN

## 2017-10-26 NOTE — Progress Notes (Signed)
Patient's daughters requested IV fluids because he is not drinking much.  He has expiratory wheezes.  RN mentioned that fluid overload can cause breathing problems, especially in older patients with heart issues.  RN said she would post a note, so MD could decide if a low dose IV normal saline might be appropriate.  Christean GriefShylon M Aviah Sorci, RN

## 2017-10-29 ENCOUNTER — Inpatient Hospital Stay: Payer: Medicare Other | Admitting: Physician Assistant

## 2017-11-02 NOTE — Plan of Care (Signed)
  Problem: Elimination: Goal: Will not experience complications related to urinary retention Outcome: Progressing   Problem: Safety: Goal: Ability to remain free from injury will improve Outcome: Progressing   

## 2017-11-02 NOTE — Progress Notes (Addendum)
Pt was asleep at 2200 meds was not given. Did do some suction to pt to clear mouth from saliva. At 2356 CCMD called and pt was ringing asystole. Came to assist pt and pt have no air movement no pulse. Pt was DNR. Page prime. Awaiting callback. Will continue to monitor.

## 2017-11-02 NOTE — Death Summary Note (Signed)
[facility] [place] [facility]    Death Note - please see Last Note for all details.  PATIENT NAME: [name] [name]    MR#:  [id number]  DATE OF BIRTH:  04/02/1921  DATE OF ADMISSION:  10/23/2017  PRIMARY CARE PHYSICIAN: Steele Sizerrissman, Mark A, MD   ADMISSION DIAGNOSIS:  Cough [R05] Hyponatremia [E87.1] Encephalopathy [G93.40] NSTEMI (non-ST elevated myocardial infarction) (HCC) [I21.4] Altered mental status, unspecified altered mental status type [R41.82]  HISTORY OF PRESENT ILLNESS ON ADMISSION (as per Dr Imogene Burnhen):  [name] Dimeslbert Rasmusson  is a 84 y.o. male with a known history of A. fib, CAD, status CABG, carotid artery disease, hypertension, hyperlipidemia, TIA and PAD.  The patient was sent from home to ED due to above chief complaints.  The patient was just discharged 3 days ago from our hospital after treatment of altered mental status, pneumonia and UTI.  At that time, CAT scan of the head was unremarkable. He was found confusion and delirium since last night.  According to his daughter, the patient did not complains any chest pain or other symptoms before delirium.  The patient had cough which is chronic.  The patient is bedbound for several months.  The patient's aspirin and Eliquis were discontinued recently  HOSPITAL COURSE:  Patient diagnosed with NSTEMI, was being worked up for encephalopathy.  Neurology was following, initially no clear abnormality on MRI per neurology.  Family did not want cardiac catheterization, and he was being treated medically for his NSTEMI.  He went spontaneously into asystole tonight.  Nursing was contacted by central telemetry and went to evaluate the patient, who was not breathing and was pulseless.  He was DNR code status and therefore no resuscitation attempt was made.      Pronounced dead by Anne HahnWILLIS, Darah Simkin FIELDING on 10/29/2017 at 12:34 AM after 1 full minute of auscultation revealed absent heart and lung sounds, absent corneal and pupillary  reflexes, and absent response to painful stimuli.                  Cause of death: Acute cardiac failure in setting of NSTEMI   Haydan Wedig FIELDING 10/23/2017, 12:38 AM  [facility] [place] [facility]    OFFICE [phone]  Total clinical and documentation time for today: <30 minutes  Note:  This document was prepared using Dragon voice recognition software and may include unintentional dictation errors.

## 2017-11-02 NOTE — Progress Notes (Signed)
Chaplain received page patient had died and there was a DNR. Chaplain went to room and introduced herself to care giver. Doctor came in and pronounce patient death. Daughter and son in law arrived. Chaplain introduced herself. Daughter thanked OrthoptistChaplain. Daughter was waiting on other sister to arrive. Chaplain told daughter she would allow them privacy and if she needed her when her sisters arrived, she could have her page.

## 2017-11-02 DEATH — deceased

## 2017-12-23 IMAGING — US US CAROTID DUPLEX BILAT
1 series · 13 of 24 positions shown · non-contrast
Comparison: MRI 08/17/2015 .

CLINICAL DATA: CVA.

EXAM:
BILATERAL CAROTID DUPLEX ULTRASOUND
TECHNIQUE: Gray scale imaging, color Doppler and duplex ultrasound were
performed of bilateral carotid and vertebral arteries in the neck.

[Series 1: us carotid duplex bilat · 13 of 57 slices shown]
[im 1/57]
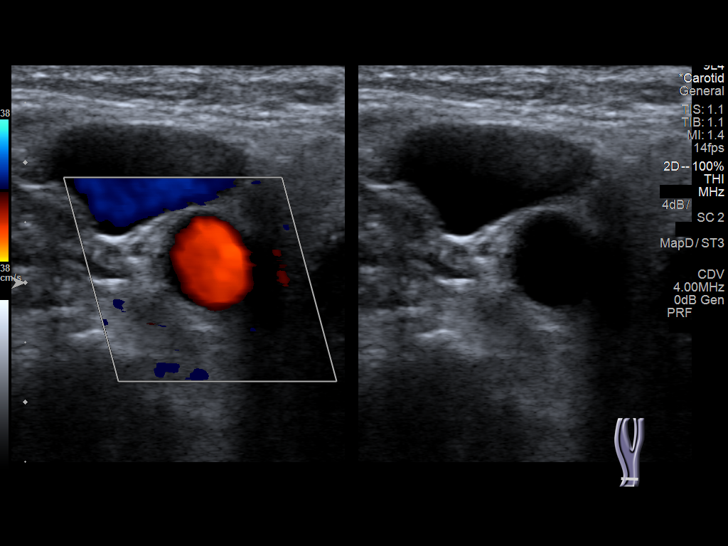
[im 5/57]
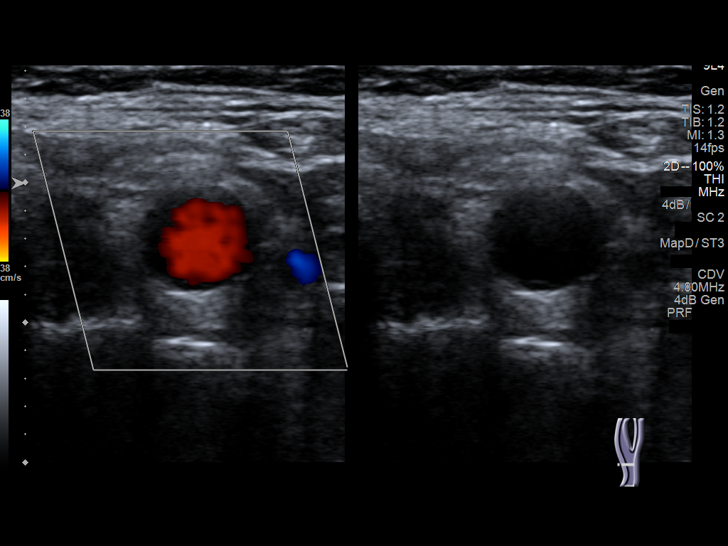
[im 10/57]
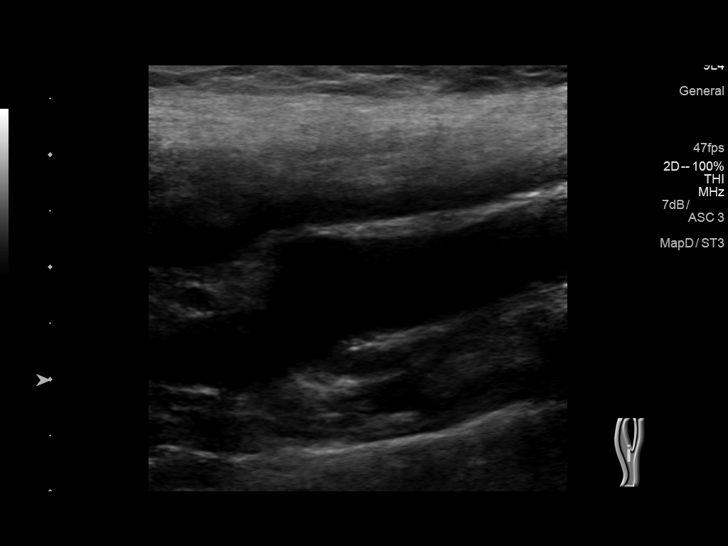
[im 15/57]
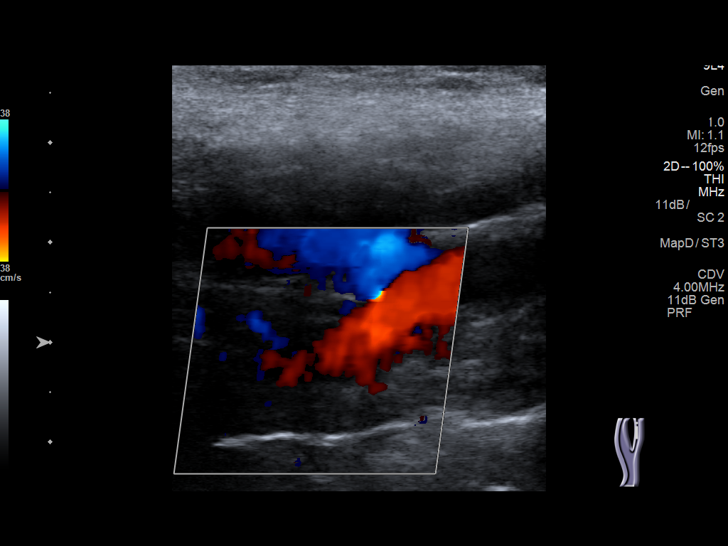
[im 20/57]
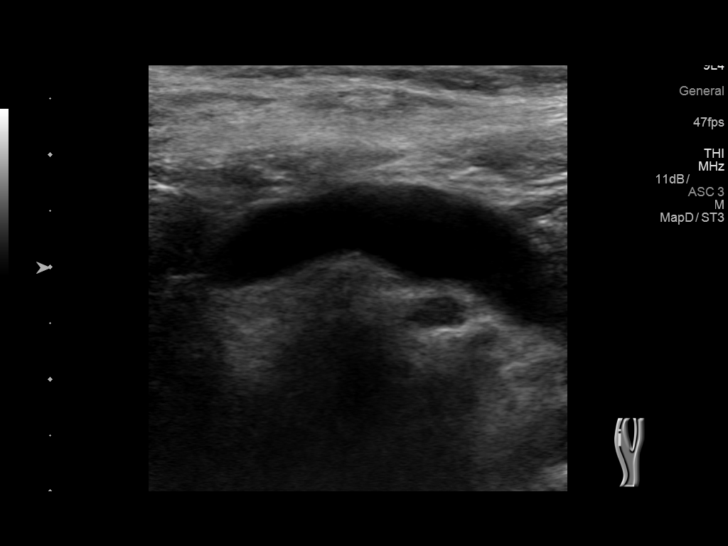
[im 25/57]
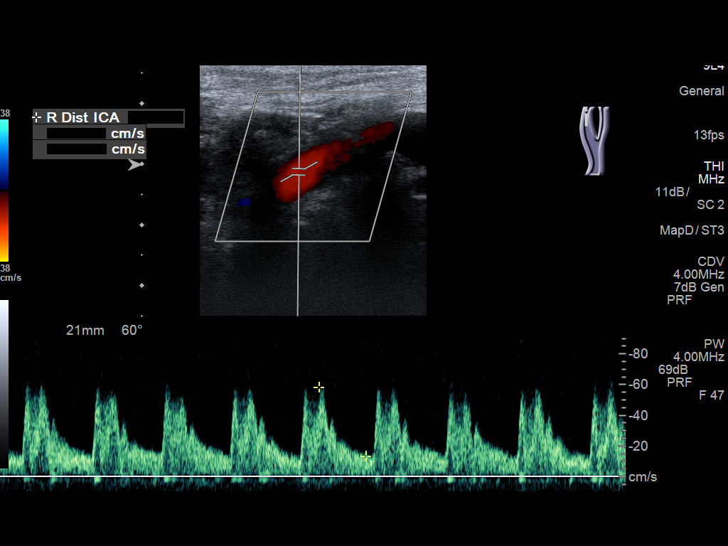
[im 30/57]
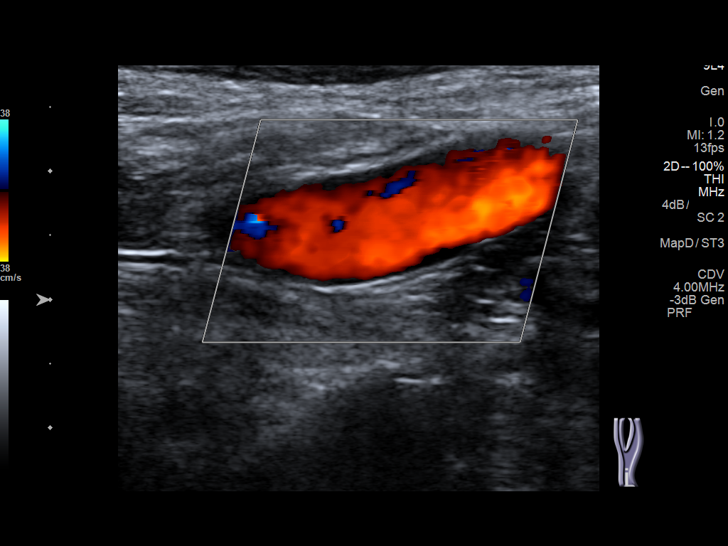
[im 32/57]
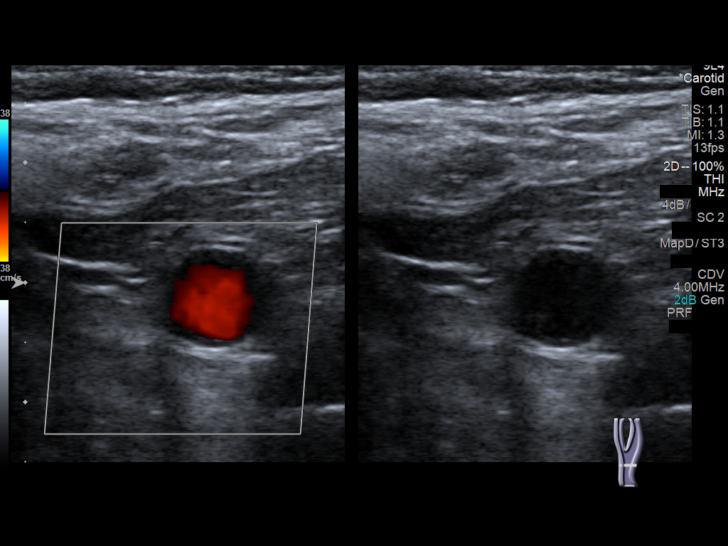
[im 37/57]
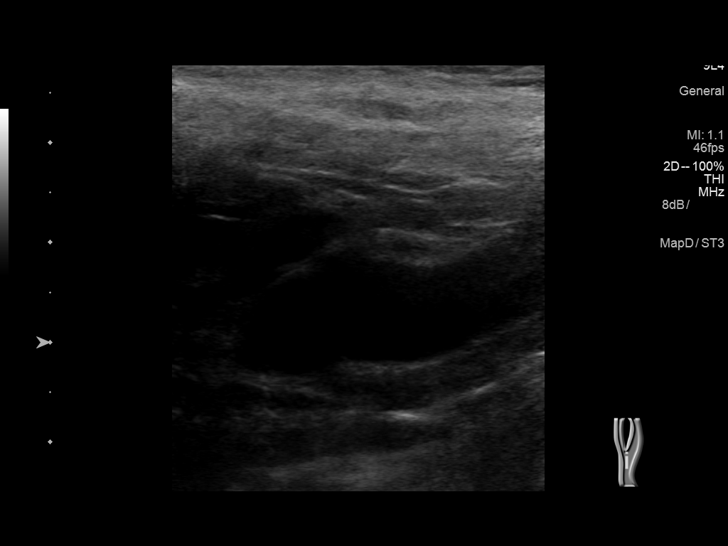
[im 42/57]
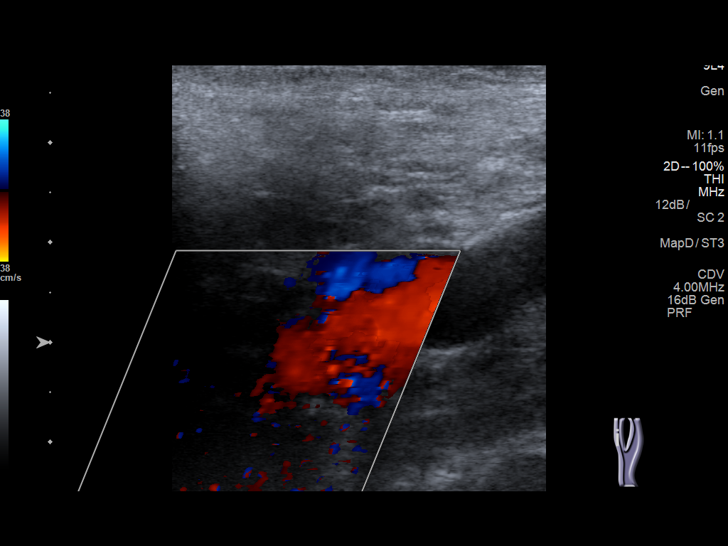
[im 47/57]
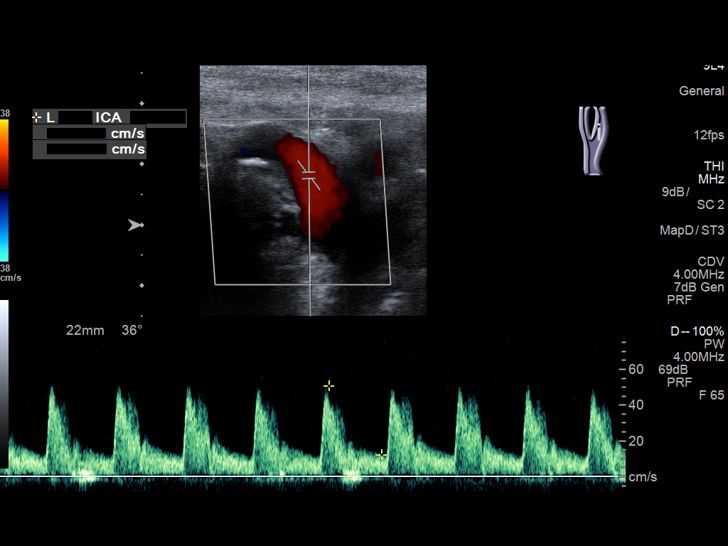
[im 52/57]
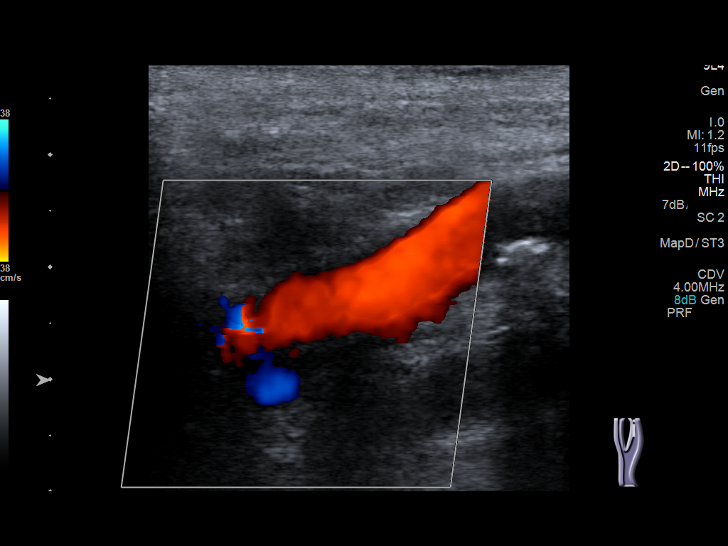
[im 57/57]
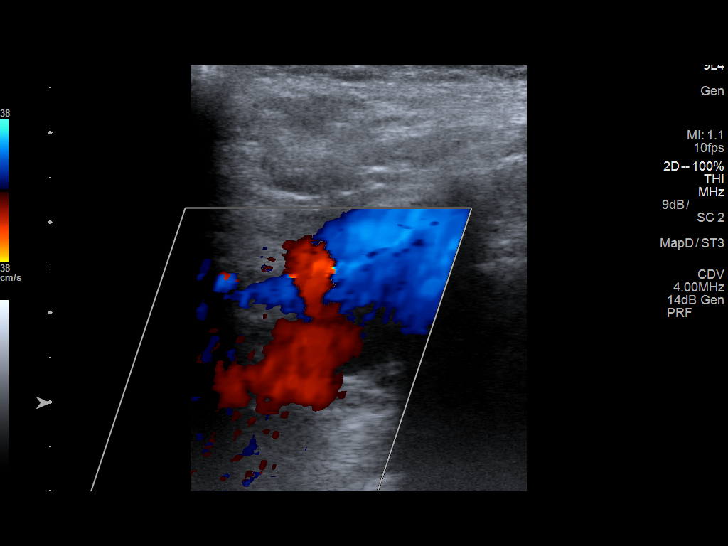

[13 of 24 positions shown; findings below may reference images not displayed]

FINDINGS: Criteria: Quantification of carotid stenosis is based on velocity
parameters that correlate the residual internal carotid diameter
with NASCET-based stenosis levels, using the diameter of the distal
internal carotid lumen as the denominator for stenosis measurement.

The following velocity measurements were obtained:

RIGHT

ICA:  72/12 cm/sec

CCA:  89/12 cm/sec

SYSTOLIC ICA/CCA RATIO:

DIASTOLIC ICA/CCA RATIO:

ECA:  59 cm/sec

LEFT

ICA:  57/14 cm/sec

CCA:  73/12 cm/sec

SYSTOLIC ICA/CCA RATIO:

DIASTOLIC ICA/CCA RATIO:

ECA:  34 cm/sec

RIGHT CAROTID ARTERY: No significant carotid atherosclerotic
vascular disease. No flow limiting stenosis.

RIGHT VERTEBRAL ARTERY:  Patent with antegrade flow.

LEFT CAROTID ARTERY: Mild left carotid bifurcation proximal ICA
atherosclerotic vascular plaque. No flow limiting stenosis.

LEFT VERTEBRAL ARTERY:  Patent with antegrade flow.
IMPRESSION: 1. Mild left carotid bifurcation proximal ICA calcified
atherosclerotic vascular plaque. No flow limiting stenosis. Degree
of stenosis less than 50%. Right carotid widely patent.

2.  Vertebral arteries are patent with antegrade flow.

## 2018-09-07 ENCOUNTER — Ambulatory Visit: Payer: Medicare Other | Admitting: Neurology

## 2018-10-13 IMAGING — US US EXTREM LOW VENOUS*L*
1 series · 13 of 24 positions shown · non-contrast
Comparison: 03/28/2015

CLINICAL DATA: Lower extremity erythema, pain, and edema. The
patient takes Eliquis.



[Series 1: us extrem low venous*left* · 0.07mm/px · 13 of 36 slices shown]
[im 1/36]
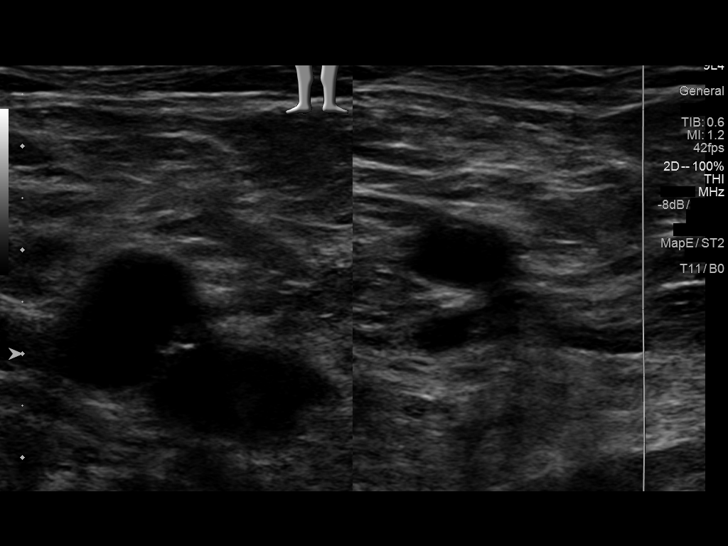
[im 4/36]
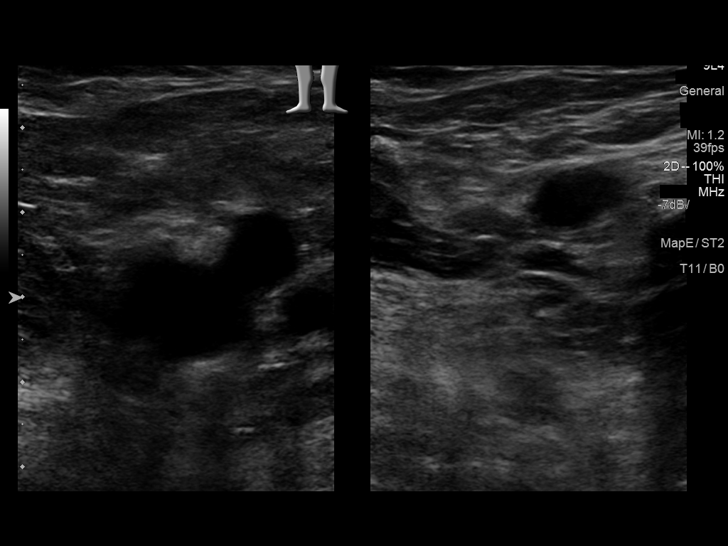
[im 7/36]
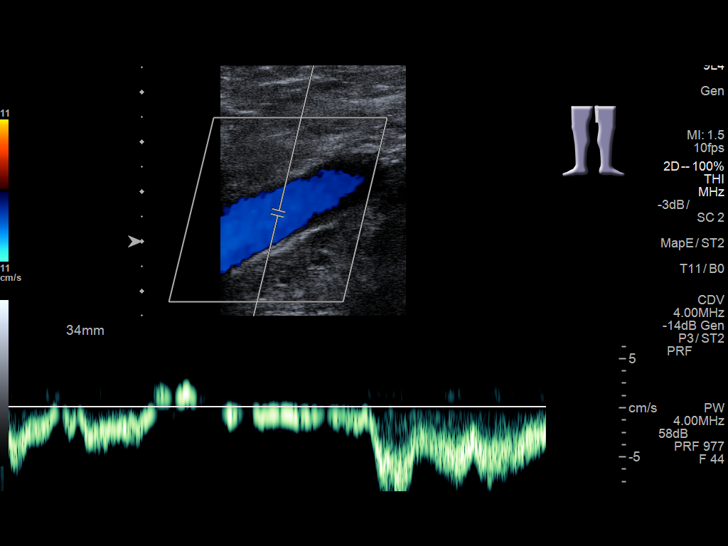
[im 10/36]
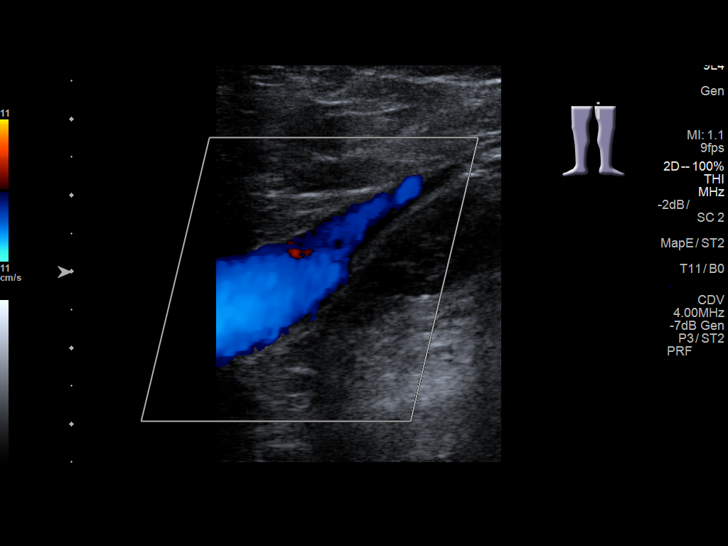
[im 13/36]
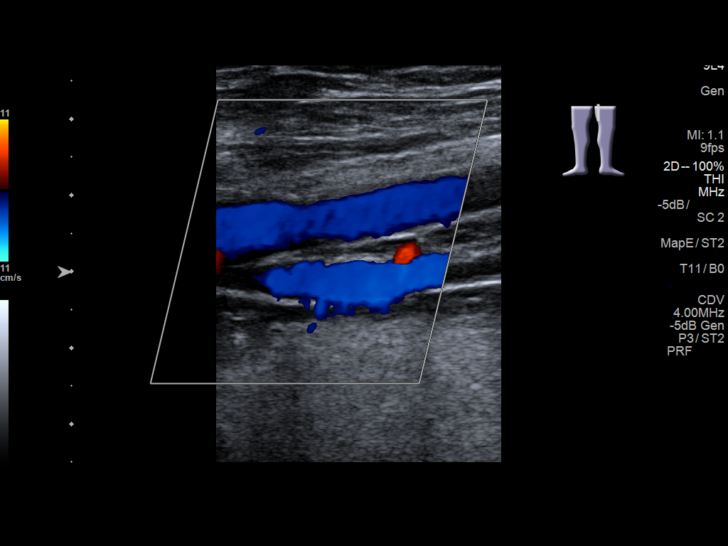
[im 16/36]
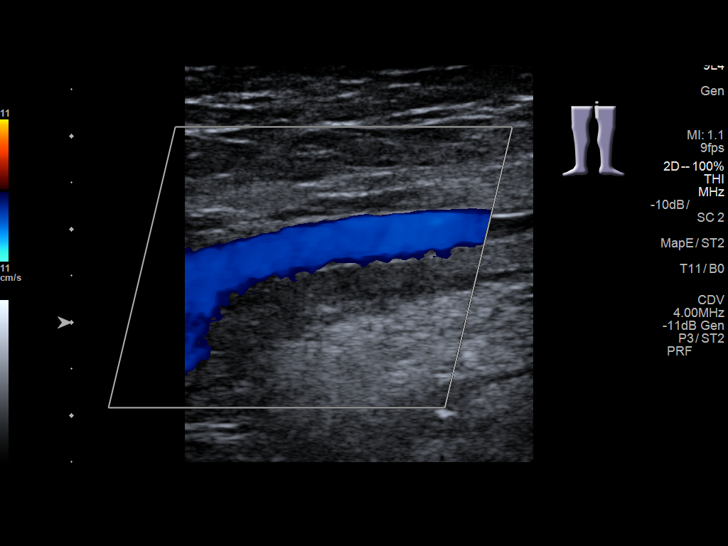
[im 19/36]
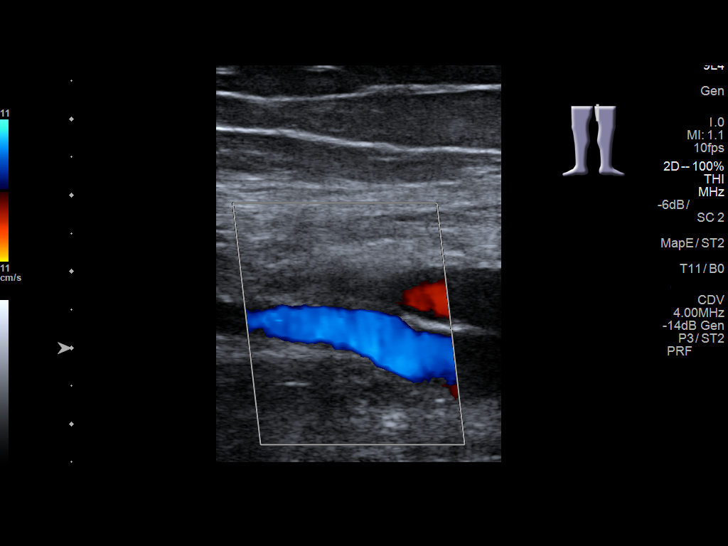
[im 20/36]
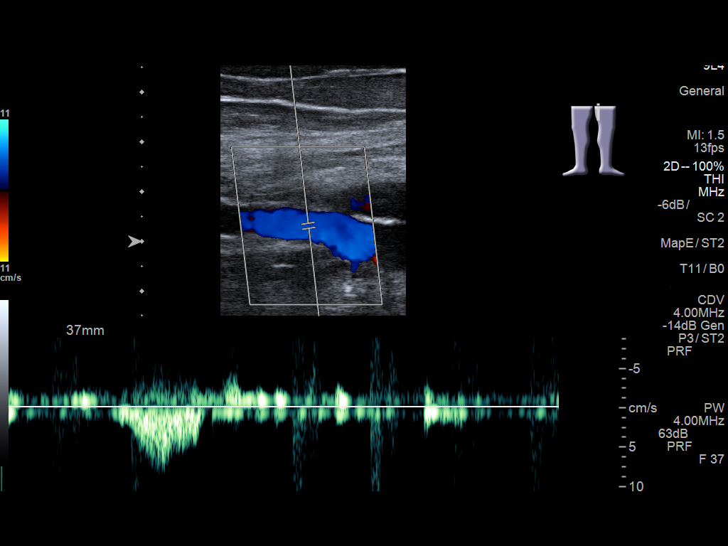
[im 23/36]
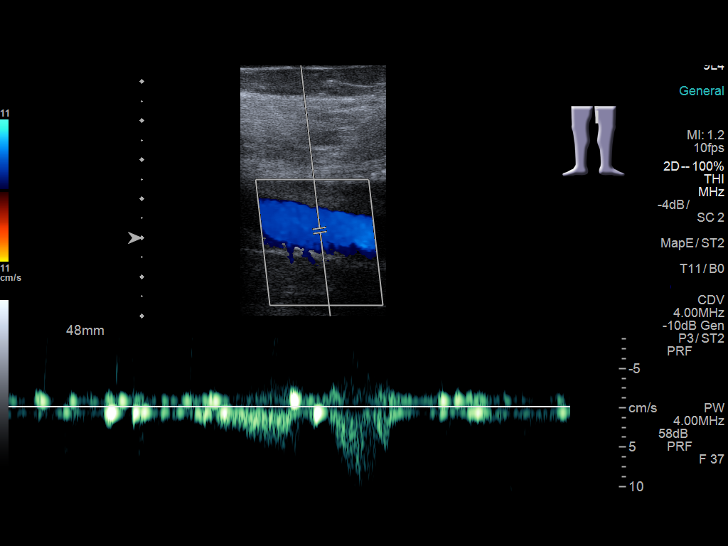
[im 26/36]
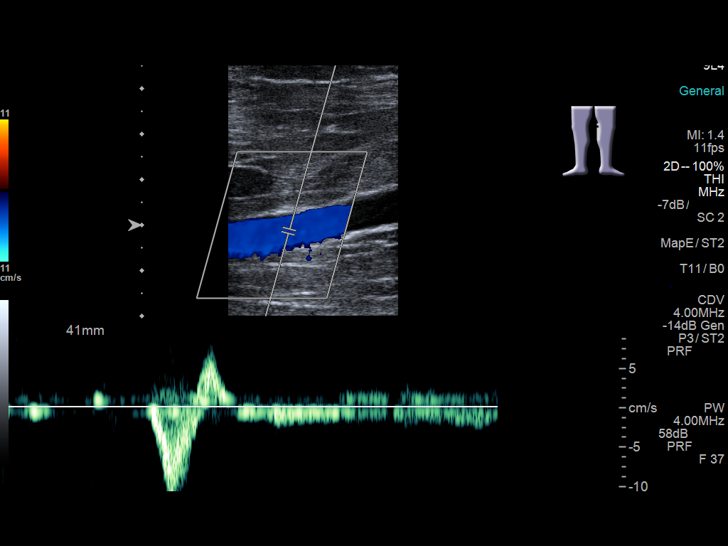
[im 29/36]
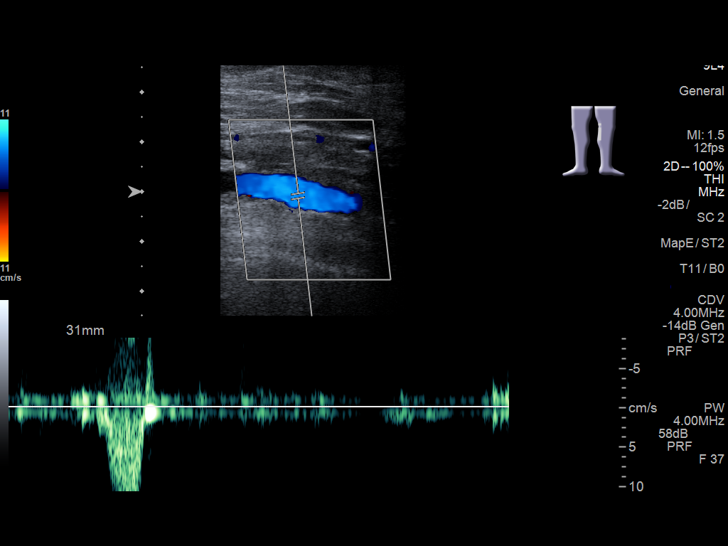
[im 32/36]
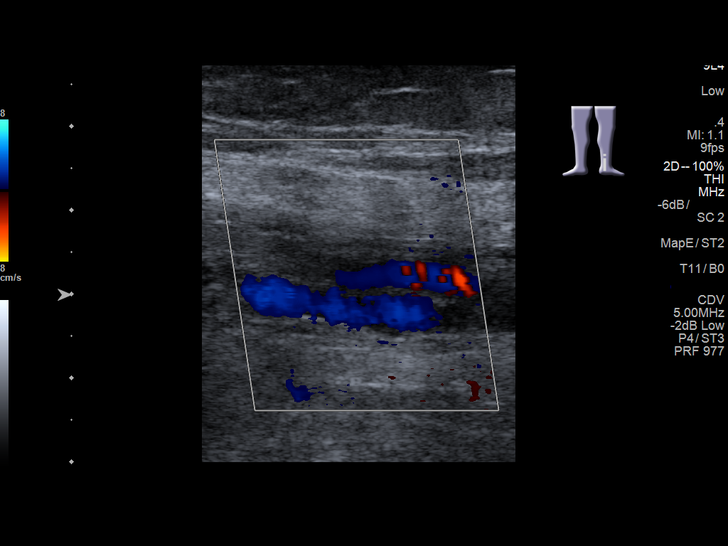
[im 36/36]
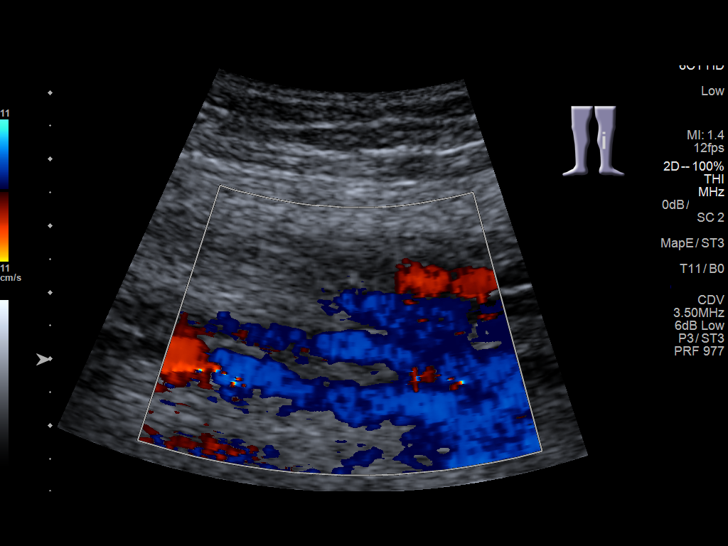

[13 of 24 positions shown; findings below may reference images not displayed]

FINDINGS: Contralateral Common Femoral Vein: Respiratory phasicity is normal
and symmetric with the symptomatic side. No evidence of thrombus.
Normal compressibility.

Common Femoral Vein: No evidence of thrombus. Normal
compressibility, respiratory phasicity and response to augmentation.

Saphenofemoral Junction: No evidence of thrombus. Normal
compressibility and flow on color Doppler imaging.

Profunda Femoral Vein: No evidence of thrombus. Normal
compressibility and flow on color Doppler imaging.

Femoral Vein: No evidence of thrombus. Normal compressibility,
respiratory phasicity and response to augmentation.

Popliteal Vein: No evidence of thrombus. Normal compressibility,
respiratory phasicity and response to augmentation.

Calf Veins: No evidence of thrombus. Normal compressibility and flow
on color Doppler imaging.

Superficial Great Saphenous Vein: No evidence of thrombus. Normal
compressibility and flow on color Doppler imaging.

Venous Reflux:  None.

Other Findings:  None.
IMPRESSION: No evidence of left lower extremity deep venous thrombosis.
